# Patient Record
Sex: Female | Born: 1955 | Race: White | Hispanic: No | Marital: Married | Smoking: Never smoker
Health system: Southern US, Community
[De-identification: ages and names within clinical notes are randomized; demographics above are authoritative.]

## PROBLEM LIST (undated history)

## (undated) DIAGNOSIS — K759 Inflammatory liver disease, unspecified: Secondary | ICD-10-CM

## (undated) DIAGNOSIS — T4145XA Adverse effect of unspecified anesthetic, initial encounter: Secondary | ICD-10-CM

## (undated) DIAGNOSIS — E119 Type 2 diabetes mellitus without complications: Secondary | ICD-10-CM

## (undated) DIAGNOSIS — I219 Acute myocardial infarction, unspecified: Secondary | ICD-10-CM

## (undated) DIAGNOSIS — F329 Major depressive disorder, single episode, unspecified: Secondary | ICD-10-CM

## (undated) DIAGNOSIS — R112 Nausea with vomiting, unspecified: Secondary | ICD-10-CM

## (undated) DIAGNOSIS — E785 Hyperlipidemia, unspecified: Secondary | ICD-10-CM

## (undated) DIAGNOSIS — R011 Cardiac murmur, unspecified: Secondary | ICD-10-CM

## (undated) DIAGNOSIS — Z9889 Other specified postprocedural states: Secondary | ICD-10-CM

## (undated) DIAGNOSIS — T8859XA Other complications of anesthesia, initial encounter: Secondary | ICD-10-CM

## (undated) DIAGNOSIS — K7290 Hepatic failure, unspecified without coma: Secondary | ICD-10-CM

## (undated) DIAGNOSIS — I2510 Atherosclerotic heart disease of native coronary artery without angina pectoris: Secondary | ICD-10-CM

## (undated) DIAGNOSIS — Z87442 Personal history of urinary calculi: Secondary | ICD-10-CM

## (undated) DIAGNOSIS — L409 Psoriasis, unspecified: Secondary | ICD-10-CM

## (undated) DIAGNOSIS — E669 Obesity, unspecified: Secondary | ICD-10-CM

## (undated) DIAGNOSIS — I10 Essential (primary) hypertension: Secondary | ICD-10-CM

## (undated) DIAGNOSIS — G629 Polyneuropathy, unspecified: Secondary | ICD-10-CM

## (undated) DIAGNOSIS — Z9289 Personal history of other medical treatment: Secondary | ICD-10-CM

## (undated) DIAGNOSIS — D126 Benign neoplasm of colon, unspecified: Secondary | ICD-10-CM

## (undated) DIAGNOSIS — C801 Malignant (primary) neoplasm, unspecified: Secondary | ICD-10-CM

## (undated) HISTORY — DX: Hyperlipidemia, unspecified: E78.5

## (undated) HISTORY — PX: COLONOSCOPY: SHX5424

## (undated) HISTORY — DX: Psoriasis, unspecified: L40.9

## (undated) HISTORY — DX: Major depressive disorder, single episode, unspecified: F32.9

## (undated) HISTORY — DX: Obesity, unspecified: E66.9

## (undated) HISTORY — DX: Personal history of other medical treatment: Z92.89

## (undated) HISTORY — DX: Hepatic failure, unspecified without coma: K72.90

## (undated) HISTORY — DX: Essential (primary) hypertension: I10

## (undated) HISTORY — DX: Benign neoplasm of colon, unspecified: D12.6

## (undated) HISTORY — PX: SHOULDER ARTHROSCOPY W/ ROTATOR CUFF REPAIR: SHX2400

## (undated) HISTORY — PX: OTHER SURGICAL HISTORY: SHX169

## (undated) HISTORY — PX: ROTATOR CUFF REPAIR: SHX139

## (undated) HISTORY — DX: Type 2 diabetes mellitus without complications: E11.9

---

## 1997-05-26 ENCOUNTER — Encounter (HOSPITAL_COMMUNITY): Admission: RE | Admit: 1997-05-26 | Discharge: 1997-08-24 | Payer: Self-pay | Admitting: *Deleted

## 1997-09-26 ENCOUNTER — Other Ambulatory Visit: Admission: RE | Admit: 1997-09-26 | Discharge: 1997-09-26 | Payer: Self-pay | Admitting: *Deleted

## 1997-10-21 ENCOUNTER — Ambulatory Visit (HOSPITAL_COMMUNITY): Admission: RE | Admit: 1997-10-21 | Discharge: 1997-10-21 | Payer: Self-pay | Admitting: Neurosurgery

## 1997-11-06 ENCOUNTER — Ambulatory Visit (HOSPITAL_COMMUNITY): Admission: RE | Admit: 1997-11-06 | Discharge: 1997-11-07 | Payer: Self-pay | Admitting: Urology

## 1998-01-21 ENCOUNTER — Inpatient Hospital Stay (HOSPITAL_COMMUNITY): Admission: EM | Admit: 1998-01-21 | Discharge: 1998-01-22 | Payer: Self-pay | Admitting: Emergency Medicine

## 1998-08-13 ENCOUNTER — Other Ambulatory Visit: Admission: RE | Admit: 1998-08-13 | Discharge: 1998-08-13 | Payer: Self-pay | Admitting: *Deleted

## 2000-12-26 ENCOUNTER — Other Ambulatory Visit: Admission: RE | Admit: 2000-12-26 | Discharge: 2000-12-26 | Payer: Self-pay | Admitting: *Deleted

## 2001-02-09 ENCOUNTER — Other Ambulatory Visit: Admission: RE | Admit: 2001-02-09 | Discharge: 2001-02-09 | Payer: Self-pay | Admitting: *Deleted

## 2001-08-07 ENCOUNTER — Other Ambulatory Visit: Admission: RE | Admit: 2001-08-07 | Discharge: 2001-08-07 | Payer: Self-pay | Admitting: *Deleted

## 2002-12-04 ENCOUNTER — Other Ambulatory Visit: Admission: RE | Admit: 2002-12-04 | Discharge: 2002-12-04 | Payer: Self-pay

## 2002-12-12 ENCOUNTER — Encounter: Admission: RE | Admit: 2002-12-12 | Discharge: 2002-12-12 | Payer: Self-pay

## 2002-12-13 ENCOUNTER — Encounter: Admission: RE | Admit: 2002-12-13 | Discharge: 2002-12-13 | Payer: Self-pay

## 2003-03-19 ENCOUNTER — Encounter: Admission: RE | Admit: 2003-03-19 | Discharge: 2003-03-19 | Payer: Self-pay | Admitting: Family Medicine

## 2003-12-09 ENCOUNTER — Other Ambulatory Visit: Admission: RE | Admit: 2003-12-09 | Discharge: 2003-12-09 | Payer: Self-pay | Admitting: Family Medicine

## 2004-08-20 ENCOUNTER — Encounter: Admission: RE | Admit: 2004-08-20 | Discharge: 2004-08-20 | Payer: Self-pay | Admitting: Family Medicine

## 2005-04-28 ENCOUNTER — Other Ambulatory Visit: Admission: RE | Admit: 2005-04-28 | Discharge: 2005-04-28 | Payer: Self-pay | Admitting: *Deleted

## 2005-05-04 ENCOUNTER — Inpatient Hospital Stay (HOSPITAL_COMMUNITY): Admission: EM | Admit: 2005-05-04 | Discharge: 2005-05-08 | Payer: Self-pay | Admitting: Emergency Medicine

## 2005-08-24 ENCOUNTER — Encounter: Payer: Self-pay | Admitting: Emergency Medicine

## 2005-09-23 HISTORY — PX: CORONARY ANGIOPLASTY WITH STENT PLACEMENT: SHX49

## 2006-02-04 ENCOUNTER — Emergency Department (HOSPITAL_COMMUNITY): Admission: EM | Admit: 2006-02-04 | Discharge: 2006-02-04 | Payer: Self-pay | Admitting: Emergency Medicine

## 2006-04-21 ENCOUNTER — Encounter: Admission: RE | Admit: 2006-04-21 | Discharge: 2006-04-21 | Payer: Self-pay | Admitting: *Deleted

## 2006-04-27 ENCOUNTER — Inpatient Hospital Stay (HOSPITAL_COMMUNITY): Admission: AD | Admit: 2006-04-27 | Discharge: 2006-04-29 | Payer: Self-pay | Admitting: *Deleted

## 2006-06-14 ENCOUNTER — Ambulatory Visit: Payer: Self-pay | Admitting: Family Medicine

## 2006-09-15 ENCOUNTER — Other Ambulatory Visit: Admission: RE | Admit: 2006-09-15 | Discharge: 2006-09-15 | Payer: Self-pay | Admitting: Family Medicine

## 2006-09-15 ENCOUNTER — Ambulatory Visit: Payer: Self-pay | Admitting: Family Medicine

## 2006-10-13 LAB — HM COLONOSCOPY

## 2006-10-19 ENCOUNTER — Encounter: Admission: RE | Admit: 2006-10-19 | Discharge: 2006-10-19 | Payer: Self-pay | Admitting: Orthopedic Surgery

## 2006-11-01 ENCOUNTER — Ambulatory Visit: Payer: Self-pay | Admitting: Family Medicine

## 2006-11-07 ENCOUNTER — Ambulatory Visit: Payer: Self-pay | Admitting: Family Medicine

## 2006-11-08 ENCOUNTER — Ambulatory Visit (HOSPITAL_COMMUNITY): Admission: AD | Admit: 2006-11-08 | Discharge: 2006-11-09 | Payer: Self-pay | Admitting: Orthopedic Surgery

## 2006-11-13 ENCOUNTER — Ambulatory Visit: Payer: Self-pay | Admitting: Family Medicine

## 2006-12-22 ENCOUNTER — Ambulatory Visit: Payer: Self-pay | Admitting: Family Medicine

## 2007-04-05 ENCOUNTER — Ambulatory Visit: Payer: Self-pay | Admitting: Family Medicine

## 2007-07-05 ENCOUNTER — Ambulatory Visit: Payer: Self-pay | Admitting: Family Medicine

## 2007-07-06 ENCOUNTER — Encounter: Admission: RE | Admit: 2007-07-06 | Discharge: 2007-07-06 | Payer: Self-pay | Admitting: Family Medicine

## 2007-10-09 ENCOUNTER — Ambulatory Visit: Payer: Self-pay | Admitting: Family Medicine

## 2008-01-07 LAB — HM DEXA SCAN: HM Dexa Scan: NORMAL

## 2008-03-19 ENCOUNTER — Other Ambulatory Visit: Admission: RE | Admit: 2008-03-19 | Discharge: 2008-03-19 | Payer: Self-pay | Admitting: Family Medicine

## 2008-03-19 ENCOUNTER — Ambulatory Visit: Payer: Self-pay | Admitting: Family Medicine

## 2008-04-10 ENCOUNTER — Ambulatory Visit: Payer: Self-pay | Admitting: Hematology and Oncology

## 2008-04-17 LAB — URINALYSIS, MICROSCOPIC - CHCC
Bilirubin (Urine): NEGATIVE
Blood: NEGATIVE
Glucose: NEGATIVE g/dL
Ketones: NEGATIVE mg/dL
Leukocyte Esterase: NEGATIVE
pH: 7.5 (ref 4.6–8.0)

## 2008-04-17 LAB — CBC WITH DIFFERENTIAL/PLATELET
BASO%: 0.4 % (ref 0.0–2.0)
EOS%: 5 % (ref 0.0–7.0)
MCH: 31.1 pg (ref 26.0–34.0)
MCHC: 34.9 g/dL (ref 32.0–36.0)
MONO#: 0.3 10*3/uL (ref 0.1–0.9)
RDW: 12.7 % (ref 11.3–14.5)
WBC: 3.4 10*3/uL — ABNORMAL LOW (ref 3.9–10.0)
lymph#: 0.8 10*3/uL — ABNORMAL LOW (ref 0.9–3.3)

## 2008-04-22 LAB — COMPREHENSIVE METABOLIC PANEL
ALT: 20 U/L (ref 0–35)
Albumin: 4.4 g/dL (ref 3.5–5.2)
Alkaline Phosphatase: 38 U/L — ABNORMAL LOW (ref 39–117)
CO2: 28 mEq/L (ref 19–32)
Glucose, Bld: 148 mg/dL — ABNORMAL HIGH (ref 70–99)
Potassium: 4 mEq/L (ref 3.5–5.3)
Sodium: 143 mEq/L (ref 135–145)
Total Bilirubin: 0.4 mg/dL (ref 0.3–1.2)
Total Protein: 7.6 g/dL (ref 6.0–8.3)

## 2008-04-22 LAB — IRON AND TIBC
%SAT: 18 % — ABNORMAL LOW (ref 20–55)
Iron: 77 ug/dL (ref 42–145)

## 2008-04-22 LAB — PROTEIN ELECTROPHORESIS, SERUM, WITH REFLEX
Beta 2: 3.3 % (ref 3.2–6.5)
Gamma Globulin: 15.6 % (ref 11.1–18.8)

## 2008-04-22 LAB — HAPTOGLOBIN: Haptoglobin: 118 mg/dL (ref 16–200)

## 2008-04-22 LAB — DIRECT ANTIGLOBULIN TEST (NOT AT ARMC)
DAT (Complement): NEGATIVE
DAT IgG: NEGATIVE

## 2008-04-22 LAB — LACTATE DEHYDROGENASE: LDH: 156 U/L (ref 94–250)

## 2008-04-22 LAB — FERRITIN: Ferritin: 29 ng/mL (ref 10–291)

## 2008-08-12 ENCOUNTER — Ambulatory Visit: Payer: Self-pay | Admitting: Family Medicine

## 2008-09-23 HISTORY — PX: CORONARY ANGIOPLASTY WITH STENT PLACEMENT: SHX49

## 2008-10-03 ENCOUNTER — Encounter: Admission: RE | Admit: 2008-10-03 | Discharge: 2008-10-03 | Payer: Self-pay | Admitting: Cardiology

## 2008-10-08 ENCOUNTER — Inpatient Hospital Stay (HOSPITAL_COMMUNITY): Admission: AD | Admit: 2008-10-08 | Discharge: 2008-10-10 | Payer: Self-pay | Admitting: Cardiology

## 2008-10-23 HISTORY — PX: OTHER SURGICAL HISTORY: SHX169

## 2009-04-08 ENCOUNTER — Other Ambulatory Visit: Admission: RE | Admit: 2009-04-08 | Discharge: 2009-04-08 | Payer: Self-pay | Admitting: Physician Assistant

## 2009-04-08 ENCOUNTER — Ambulatory Visit: Payer: Self-pay | Admitting: Family Medicine

## 2009-05-07 ENCOUNTER — Ambulatory Visit: Payer: Self-pay | Admitting: Family Medicine

## 2009-10-01 ENCOUNTER — Ambulatory Visit: Payer: Self-pay | Admitting: Family Medicine

## 2009-10-02 ENCOUNTER — Encounter: Admission: RE | Admit: 2009-10-02 | Discharge: 2009-10-02 | Payer: Self-pay | Admitting: Family Medicine

## 2010-04-25 HISTORY — PX: TRANSTHORACIC ECHOCARDIOGRAM: SHX275

## 2010-05-05 ENCOUNTER — Ambulatory Visit
Admission: RE | Admit: 2010-05-05 | Discharge: 2010-05-05 | Payer: Self-pay | Source: Home / Self Care | Attending: Family Medicine | Admitting: Family Medicine

## 2010-05-05 ENCOUNTER — Other Ambulatory Visit
Admission: RE | Admit: 2010-05-05 | Discharge: 2010-05-05 | Payer: Self-pay | Source: Home / Self Care | Admitting: Family Medicine

## 2010-05-15 ENCOUNTER — Encounter: Payer: Self-pay | Admitting: Family Medicine

## 2010-05-21 DIAGNOSIS — Z9289 Personal history of other medical treatment: Secondary | ICD-10-CM

## 2010-05-21 HISTORY — DX: Personal history of other medical treatment: Z92.89

## 2010-08-02 LAB — CBC
HCT: 28.5 % — ABNORMAL LOW (ref 36.0–46.0)
HCT: 30.8 % — ABNORMAL LOW (ref 36.0–46.0)
Hemoglobin: 10.7 g/dL — ABNORMAL LOW (ref 12.0–15.0)
Hemoglobin: 9.9 g/dL — ABNORMAL LOW (ref 12.0–15.0)
MCV: 88.2 fL (ref 78.0–100.0)
RBC: 3.23 MIL/uL — ABNORMAL LOW (ref 3.87–5.11)
RBC: 3.49 MIL/uL — ABNORMAL LOW (ref 3.87–5.11)
RDW: 14.3 % (ref 11.5–15.5)
WBC: 3.3 10*3/uL — ABNORMAL LOW (ref 4.0–10.5)
WBC: 4.4 10*3/uL (ref 4.0–10.5)

## 2010-08-02 LAB — CARDIAC PANEL(CRET KIN+CKTOT+MB+TROPI)
CK, MB: 13.4 ng/mL — ABNORMAL HIGH (ref 0.3–4.0)
Relative Index: 8 — ABNORMAL HIGH (ref 0.0–2.5)
Total CK: 172 U/L (ref 7–177)
Troponin I: 0.96 ng/mL (ref 0.00–0.06)

## 2010-08-02 LAB — BASIC METABOLIC PANEL
BUN: 17 mg/dL (ref 6–23)
CO2: 28 mEq/L (ref 19–32)
CO2: 29 mEq/L (ref 19–32)
Calcium: 8.5 mg/dL (ref 8.4–10.5)
Calcium: 8.9 mg/dL (ref 8.4–10.5)
GFR calc Af Amer: 58 mL/min — ABNORMAL LOW (ref >60)
GFR calc Af Amer: >60 mL/min (ref >60)
GFR calc non Af Amer: 46 mL/min — ABNORMAL LOW (ref >60)
GFR calc non Af Amer: 48 mL/min — ABNORMAL LOW (ref >60)
GFR calc non Af Amer: 51 mL/min — ABNORMAL LOW (ref >60)
Glucose, Bld: 113 mg/dL — ABNORMAL HIGH (ref 70–99)
Glucose, Bld: 130 mg/dL — ABNORMAL HIGH (ref 70–99)
Glucose, Bld: 196 mg/dL — ABNORMAL HIGH (ref 70–99)
Potassium: 3.9 mEq/L (ref 3.5–5.1)
Potassium: 4 mEq/L (ref 3.5–5.1)
Potassium: 4.2 mEq/L (ref 3.5–5.1)
Sodium: 137 mEq/L (ref 135–145)
Sodium: 141 mEq/L (ref 135–145)
Sodium: 142 mEq/L (ref 135–145)

## 2010-08-02 LAB — GLUCOSE, CAPILLARY
Glucose-Capillary: 122 mg/dL — ABNORMAL HIGH (ref 70–99)
Glucose-Capillary: 170 mg/dL — ABNORMAL HIGH (ref 70–99)
Glucose-Capillary: 217 mg/dL — ABNORMAL HIGH (ref 70–99)
Glucose-Capillary: 89 mg/dL (ref 70–99)

## 2010-09-07 NOTE — Cardiovascular Report (Signed)
Haley George, Haley George NO.:  [id number]   MEDICAL RECORD NO.:  [id number]          PATIENT TYPE:  INP   LOCATION:  2507                         FACILITY:  [facility]   PHYSICIAN:  Thereasa Solo. Little, M.D. DATE OF BIRTH:  [birth year]   DATE OF PROCEDURE:  10/09/2008  DATE OF DISCHARGE:                            CARDIAC CATHETERIZATION   INDICATIONS FOR TEST:  This 55 year old female has had multiple previous  stenting.  She underwent a diagnostic cath on October 08, 2008, for chest  pain.  This revealed areas of significant stenosis in her LAD and  ongoing circumflex, and she is brought to the cath lab for intervention.   After obtaining informed consent, the patient was prepped and draped in  the usual sterile fashion exposing both groins.  Following local  anesthetic with 1% Xylocaine and using a Smart needle, the right femoral  artery was entered and a 6-French introducer sheath was placed.  A JL-4  guide catheter and a short Luge wire was used for the entire procedure.   The patient was given IV Angiomax, had an ACT prior to starting the  procedure of 388.   The initial lesion to be addressed was the mid LAD.  On the diagnostic  images, there was a 2.5 x 12 mini Vision stent that had been placed in  November 2007.  Proximal to this stent was an area of 90% narrowing.  It  was not in the stent, but proximal to the stent.   The guidewire was easily placed down the LAD and through the prior  stent.  A 2.25 x 12 Taxus stent was placed in an overlapping manner.  The area proximal to the stent was well covered by the stent.  The  initial deployment was 13 atmospheres for 45 seconds and a final  inflation was 13 atmospheres for 40 seconds.   Post dilatation was accomplished with a 2.5 x 8 [place] Quantum balloon at 12  atmospheres for 40 seconds.   The area that had been 90% narrowed in the LAD now appeared to be normal  after stenting.  However, distal to the previously  placed stent, now was  an area of 80% narrowing that did not appear to be present prior to PCI.  Intracoronary nitroglycerin failed to show any change in this.  The wire  was pulled back into the stent and there was still no change in this.  At this point, the decision was made to stent this area.  At this the  level of her LAD, the vessel was relatively small in diameter.  A 2.0 x  12 mini Vision balloon was placed in such a manner that it overlapped  the original stent and extended past the area of obstruction.  As I  inflated the balloon, the stent was seen to migrate on fluoroscopy up  into the previous stent.  At this point, there was nothing active to do,  but continue to inflate the balloon.  The stent was deployed at 10  atmospheres for 40 seconds and the final inflation was 5 atmospheres for  28 seconds.  At this point, the area of obstruction was unchanged and  the stent was deployed within the previous stent.   Again, a 2.0 x 12 mini Vision balloon was made ready and placed in an  overlapping manner.  It was deployed at 9 atmospheres for 40 seconds and  8 atmospheres for 20 seconds.  Post dilatation of this entire area was  accomplished with 2.0 x 12 [place] Quantum balloon.  Single inflation of 10  atmospheres for 30 seconds was performed.  The area that had been 80%  narrowed post PCI was now normal.  Unfortunately, her entire LAD system  past this point is probably about 0.5 to 1 mm in diameter.  The stent is  widely patent.  There is brisk TIMI III flow.  There is no evidence of  any dissection or thrombus formation.   Attention was then addressed to the OM system.  The distal ongoing circ  gave rise to its terminal OM had a stent that was 2.5 x 18 Cypher that  had been placed in January 2007.  Proximal to the stent was a long area  of 70-80% narrowing.  The stent itself was free of disease and distal to  the stent where the vessel was relatively small, it was less than 50%   narrowing.   A 2.5 x 16 Taxus stent was placed in an overlapping manner covering the  area of obstruction proximal to the stent and it was distal to the  bifurcation of the OM that came off above this area.  It was deployed at  12 atmospheres for 40 seconds with a final inflation of 13 atmospheres  for 40 seconds.  Post dilatation was accomplished with an [place] Quantum  balloon 2.75 x 15.   The area that had been 70-80% narrowed pre-intervention now appeared to  be normal.  There was brisk TIMI III flow.  No dissection or thrombus,  and the distal vessel was small and had less than 50% area of narrowing.   The IV Angiomax was discontinued.  An EKG, BMET, and CK and troponin  will be checked in the morning and the patient should be ready for  discharge unless there is a significant hematoma or laboratory  abnormality.   Total contrast was 280 mL.  Blood pressure during the procedure was  approximately 128/80, and her heart rate was sinus between 58 and 65.           ______________________________  Thereasa Solo. Little, M.D.     ABL/MEDQ  D:  10/09/2008  T:  10/10/2008  Job:  [id number]   cc:   Ritta Slot, MD  Sharlot Gowda, M.D.

## 2010-09-07 NOTE — Consult Note (Signed)
[name], George NO.:  [id number]   MEDICAL RECORD NO.:  [id number]          PATIENT TYPE:  INP   LOCATION:  4705                         FACILITY:  [facility]   PHYSICIAN:  Hillery Aldo, M.D.   DATE OF BIRTH:  [birth year]   DATE OF CONSULTATION:  DATE OF DISCHARGE:                                 CONSULTATION   PRIMARY CARE PHYSICIAN:  Sharlot Gowda, M.D.   ORTHOPEDIC SURGEON:  Dyke Brackett, M.D.   REASON FOR CONSULTATION:  Management of new onset diabetes.   HISTORY OF PRESENT ILLNESS:  The patient is a 55 year old female with  past medical history of gestational diabetes, hypertension,  hyperlipidemia, and coronary artery disease, who presented to the  hospital for an elective repair of her right rotator cuff secondary to  degenerative joint disease.  The patient has had routine medical care  with frequent evaluations for possible diabetes since being diagnosed  with gestational diabetes.  She states that her hemoglobin A1C has  always been normal.  Nevertheless, during the recent preoperative  evaluation, she was found to have an elevated glucose in the 190s range.  She was seen at her primary care physician's office and given a dose of  10 units of Lantus in preparation for her surgery today.  She has not  been on any antidiabetic medications recently but during her episode of  gestational diabetes, was on medications. She also had a prior stent  when she was put on Glucotrol in the past.  The patient is now  postoperative with glucose ratings in the mid 200s.  She denies having  preoperative symptoms of generalized weakness, excessive thirst,  polyuria or skin problems.  We are asked to help manage her diabetes  postoperatively.   PAST MEDICAL HISTORY:  1. Hypertension.  2. Dyslipidemia.  3. 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  4. 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  5. She had a history of three stents to coronaries in the past.  Her      ejection fraction is preserved.  6. History of hepatic failure with transaminitis thought to be      secondary to mild autoimmune disease in the setting of Statin      therapy.  7. History of nephrolithiasis status post surgery.  8. History of duodenitis and mild antral gastritis by EGD.  9. Status post hemorrhoid surgery.  10.History of reflex sympathetic dystrophy.  11.Gestational diabetes.   FAMILY HISTORY:  Both parents had a history of early MI in the 57s.  The  patient's mother died at 34 from congestive heart failure and diabetes.  The patient's father died at 24 from congestive heart failure and  complications of alcoholism. She has one brother who is 102 and has  hypertension.   SOCIAL HISTORY:  The patient is married.  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  She denies any tobacco or alcohol use.  She has one healthy daughter, age 55.  ALLERGIES:  PENICILLIN CAUSES A RASH.  SHE IS INTOLERANT TO BETA  BLOCKERS WITH STATIN.   CURRENT MEDICATIONS:  1. Plavix 75 mg daily.  2. Aspirin 325 mg daily.  3. Diovan HCL 160/25 daily.  4. Ranexa 1,000 mg b.i.d.  5. Carvedilol 3.125 mg b.i.d.  6. Omeprazole 20 mg daily.  7. Isosorbide 30 mg daily.  8. Tricor 145 mg daily.  9. Nasonex PM over the counter b.i.d.  10.Os-Cal 500 mg daily.  11.Fish oil 1,200 mg x4 daily.   REVIEW OF SYSTEMS:  The patient denies any chest pain or dyspnea.  She  has not had any cough or dysuria.  Bowels are moving normally.  No blood  in the stools.  Comprehensive 12-point review of systems otherwise  negative.   PHYSICAL EXAMINATION:  VITAL SIGNS:  Temperature 96.9, blood pressure  122/58, pulse 78, respirations 10, O2 saturation 100% on 2 liters.  Capillary blood glucose is 223.  GENERAL:  This is a well-developed, slightly obese female who is in no  acute distress postoperatively.  HEENT:   Normocephalic, atraumatic.  PERRL.  EOMI.  Oropharynx reveals  dry mucous membranes.  NECK:  Supple, no thyromegaly, no lymphadenopathy, no jugular venous  distention.  CHEST:  Lungs clear to auscultation bilaterally with decreased breath  sounds in the bases.  HEART:  Regular rate, rhythm.  No murmurs, rubs, gallops.  ABDOMEN:  Soft, nontender, nondistended with normal active bowel sounds.  EXTREMITIES:  No clubbing, edema or cyanosis.  NEUROLOGIC:  Nonfocal.   DATA REVIEW:  Laboratory data:  Sodium was 135, potassium 4.2, chloride  96, bicarb 29, BUN 20, creatinine 1.07, glucose 239.  Liver function  studies are within normal limits.  White blood cell count is 6.6,  hemoglobin 12.6, hematocrit 36.3, platelets 367.  Urinalysis is negative  for glucose, nitrites and leukocyte esterase.   ASSESSMENT AND PLAN:  1. New onset diabetes:  Postoperatively, we will cover her with a      combination of basal and short acting insulin, moderate sensitive      scale.  Once the patient has recovered postoperatively and is      eating, we will consider putting her on an oral drug.  Metformin      would probably be a good starting choice for this patient who is      mildly obese.  2. Hypertension:  The patient's blood pressure should be well      controlled in the postoperative period.  All of her preoperative      medications have been resumed by her treating physicians.  3. Coronary artery disease:  The patient is asymptomatic.  We will      continue her aspirin, Plavix as well as her other cardiac      medications.  4. Dyslipidemia:  The patient is actively being treated with fish oil      and Tricor.  We will continue these.  5. History of hepatic failure:  LFTs are within normal limits.      Monitor.  6. Prophylaxis:  The patient is on a proton pump inhibitor therapy and      is on deep venous thrombosis prophylactic therapy.   Thank you for this consultation.  We will follow the patient  with you.      Hillery Aldo, M.D.  Electronically Signed     CR/MEDQ  D:  11/08/2006  T:  11/08/2006  Job:  [id number]   cc:   W. D.  Madelon Lips, M.D.  Sharlot Gowda, M.D.

## 2010-09-07 NOTE — Discharge Summary (Signed)
Haley George, KUCHER NO.:  [id number]   MEDICAL RECORD NO.:  [id number]          PATIENT TYPE:  INP   LOCATION:  2507                         FACILITY:  [facility]   PHYSICIAN:  Ritta Slot, MD     DATE OF BIRTH:  1955/12/01   DATE OF ADMISSION:  10/08/2008  DATE OF DISCHARGE:  10/10/2008                               DISCHARGE SUMMARY   ADDENDUM   The patient was also given Ultram 50 mg one every 8 hours as needed for  shoulder pain.      [name]. Ingold, N.P.      Ritta Slot, MD  Electronically Signed    LRI/MEDQ  D:  10/10/2008  T:  10/11/2008  Job:  [id number]

## 2010-09-07 NOTE — Op Note (Signed)
NAME:  Haley George, Haley George NO.:  [id number]   MEDICAL RECORD NO.:  [id number]          PATIENT TYPE:  INP   LOCATION:  4705                         FACILITY:  [facility]   PHYSICIAN:  Dyke Brackett, M.D.    DATE OF BIRTH:  10-Sep-1955   DATE OF PROCEDURE:  DATE OF DISCHARGE:                               OPERATIVE REPORT   PREOPERATIVE DIAGNOSES:  1. Large rotator cuff tear, right shoulder.  2. Impingement.  3. Acromioclavicular joint arthritis.  4. Degenerative tearing posterior-superior labrum.   POSTOPERATIVE DIAGNOSES:  1. Large rotator cuff tear, right shoulder.  2. Impingement.  3. Acromioclavicular joint arthritis.  4. Degenerative tearing posterior-superior labrum.   OPERATION:  1. Open acromioplasty, rotator cuff repair.  2. Arthroscopic debridement of torn labrum.  3. Open distal clavicle excision.   SURGEON:  Dyke Brackett, M.D.   ASSISTANT:  Arlys John D. Petrarca, P.A.-C.   BLOOD LOSS:  Approximately 100.   DESCRIPTION OF PROCEDURE:  Sterile prep and drape, beach chair  positioning, posterior portal arthroscopically created anteriorly as  well.  Degenerative tearing posterior-superior labrum was debrided.  There actually was a small area in the glenoid where there appeared to  be some degenerative change right in the center of the glenoid. There  was no corresponding degenerative change on the humerus.  This area been  measured probably about 2 x 3 x 4 mm.  There was a large L shaped tear  of the cuff with the transverse component measuring about 3-4 cm and the  L component probably an additional 3-4.  There was moderate interstitch  to the undersurface of fraying type tear partial thickness tear as well.  The procedure was next converted to open procedure with incision  bisecting the acromial AC joint. The [facility] joint was extremely  degenerative.  The acromion was moderately flattened and acromioplasty  was minimal and the distal clavicle excision carried  out splitting the  deltoid about 2 cm distal to the edge of the acromion revealing the  large cuff tear.  Bursa was excised.  Cuff tear edges were freshened  with the 15 blade.  There was insertion of 2 Arthrex 55 anchors on the  burred surface of the tuberosity.  Two of the sutures were placed in the  larger portion of the tear followed by the more distal portion of one of  the suture from the second anchor was used and one was not.  The L  shaped component was oversewn with interrupted FiberWire as well as a  small rent at the bicipital aponeurosis relative to the leading edge of  the cup.  The deltoid was repaired with  interrupted Ethibond with one stitch being placed through a drill hole  in the acromion and the subcutaneous tissues with 2-0 Vicryl.  Marcaine  was infiltrated in the cup as well as in the subcutaneous tissue and  skin, placed in a sling, taken to recovery in stable condition.      Dyke Brackett, M.D.  Electronically Signed     WDC/MEDQ  D:  11/08/2006  T:  11/08/2006  Job:  [id number]

## 2010-09-07 NOTE — Discharge Summary (Signed)
[name], [name] NO.:  [id number]   MEDICAL RECORD NO.:  [id number]          PATIENT TYPE:  INP   LOCATION:  2507                         FACILITY:  [facility]   PHYSICIAN:  Ritta Slot, MD     DATE OF BIRTH:  1955/09/05   DATE OF ADMISSION:  10/08/2008  DATE OF DISCHARGE:  10/10/2008                               DISCHARGE SUMMARY   ADDENDUM.   Please note, she will be out of work until October 15, 2008, instead of  October 22, 2008, per Dr. Lynnea Ferrier.   Please note shoulder pain.  She was given Ultram to see if that would  help, kind of an achy feeling.  Dr. Lynnea Ferrier did not believe this was  related to her heart.   LABORATORY DATA:  Hemoglobin at time of discharge 9.9, hematocrit 28.5,  WBC 4.4, platelets 180, MCV 88.2.  Prior to procedure, hemoglobin 11.7,  hematocrit 33.6 related to procedures.   Chemistry, sodium at discharge 137, potassium 4.0, chloride 105, CO2 28,  BUN 12, creatinine 1.13, glucose 196.   Cardiac enzymes, postprocedure troponin 0.96 x2.  CK 167, 172.  MB 14.3  and 13.4.   Calcium 8.5.       [name]. Ingold, N.P.      Ritta Slot, MD  Electronically Signed    LRI/MEDQ  D:  10/10/2008  T:  10/11/2008  Job:  [id number]   cc:   Sharlot Gowda, M.D.

## 2010-09-07 NOTE — Discharge Summary (Signed)
Haley, George NO.:  [id number]   MEDICAL RECORD NO.:  [id number]          PATIENT TYPE:  INP   LOCATION:  2507                         FACILITY:  [facility]   PHYSICIAN:  Ritta Slot, MD     DATE OF BIRTH:  05-21-1955   DATE OF ADMISSION:  10/08/2008  DATE OF DISCHARGE:  10/10/2008                               DISCHARGE SUMMARY   DISCHARGE DIAGNOSES:  1. Increasing angina.  2. Coronary artery disease with previous stents to the right coronary      artery, left anterior descending and circumflex.      a.     New stenosis in the left anterior descending and circumflex       obtuse marginal undergoing total of 4 stents with improvement of       stenosis.  3. Normal left ventricular function, ejection fraction 65%.  4. Diabetes mellitus type 2.  5. Dyslipidemia.  6. Autoimmune hepatitis in 1997.  7. Hypertension.   DISCHARGE CONDITION:  Stable.   PROCEDURES:  1. Elective cardiac catheterization, October 08, 2008 by Dr. Lynnea Ferrier.  2. October 09, 2008, percutaneous transluminal coronary angioplasty and      stent deployments to stenosis in left anterior descending and      circumflex by Dr. Julieanne Manson.   DISCHARGE MEDICATIONS:  1. Aspirin 325 mg daily.  2. Plavix 75 mg daily.  3. Coreg 3.125 mg twice a day.  4. Ranexa 1000 mg twice a day.  5. Isosorbide 60 mg daily.  6. Diovan HCT 160/25 daily.  7. Crestor we have increased the dose to 5 mg every day and attempt to      see how she does though she has had problems with statins in the      past.  8. TriCor 145 mg daily.  9. Omeprazole was stopped.  She was recommended Protonix 40 mg three      times a week instead due to interaction with drug-eluting stents.  10.ActoPlus 15/500.  She is not to take until October 12, 2008 because it      could interfere with the cath done.  11.Nitroglycerin sublingual 0.4 mg as needed for chest pain.  12.Do not stop Plavix, stopping because of heart attack.   DISCHARGE  INSTRUCTIONS:  1. Return to work on October 22, 2008 per Dr. Lynnea Ferrier.  2. Low-sodium heart-healthy diabetic diet.  3. Increase activity slowly.  4. No lifting for 2 days.  No driving for 2 days.  5. Wash cath site with soap and water.  Call if any bleeding,      swelling, or drainage.  6. Follow up with Dr. Lynnea Ferrier on October 21, 2008 at 9 a.m.   HISTORY OF PRESENT ILLNESS:  A 55 year old female patient with history  of coronary artery disease with multiple stents in the past who  presented to Dr. Lynnea Ferrier, September 30, 2008 with complaints of chest pain,  burning sensation or heaviness in her chest with come off and on  intermittently, has had increased anxiety and stress recently.  Because  of false negative stress test in  the past, she preferred to proceed with  coronary angiography and was scheduled for cardiac catheterization on  October 08, 2008.   OTHER HISTORY:  Please see problem list.  The patient presented to [facility]  on October 08, 2008 and underwent cardiac catheterization and she indeed  does have increased disease with 90% stenosis proximal to the stent in  LAD and 70% stenosis in the circ below the stent.  She was kept on  monitor in the 2500 and underwent the next day, October 09, 2008,  interventions to both these areas by Dr. Clarene Duke.  She tolerated the  procedure.   By the next morning, she was able to ambulate without further problems  and was seen by Dr. Lynnea Ferrier and ready for discharge and he recommended  phase 2 cardiac rehab, but she is currently feeling very overwhelmed  with life in job.  She does not believe she can take time out for  cardiac rehab.  She will try and do the exercises at home.   She was discharged and will follow up as instructed.      Haley George. Ingold, N.P.      Ritta Slot, MD  Electronically Signed    LRI/MEDQ  D:  10/10/2008  T:  10/11/2008  Job:  [id number]   cc:   Thereasa Solo. Little, M.D.  Sharlot Gowda, M.D.

## 2010-09-10 NOTE — H&P (Signed)
NAMETANEYA, CONKEL NO.:  [id number]   MEDICAL RECORD NO.:  [id number]          PATIENT TYPE:  INP   LOCATION:  1823                         FACILITY:  [facility]   PHYSICIAN:  Ulyses Amor, MD DATE OF BIRTH:  25-Jun-1955   DATE OF ADMISSION:  05/04/2005  DATE OF DISCHARGE:                                HISTORY & PHYSICAL   Haley George is a 55 year old white woman who is admitted to [facility] for what appears to be a non-ST segment elevation acute myocardial  infarction.   The patient, who has no past history of cardiac disease, experienced the  onset of chest pain while working at her desk at approximately 1:30 this  afternoon.  The discomfort was described as a pressure across the upper and  left anterior chest.  It radiated to her left arm.  It was associated with  nausea and diaphoresis but no dyspnea.  There were no exacerbating or  ameliorating factors.  It appeared not to be related to position, activity,  meals, or respirations.  It continued throughout the afternoon and  ultimately prompted her visit to her primary care doctor who then referred  her to the emergency department.  The chest pain began to resolve in the  emergency department when she was treated with nitroglycerin.  It is largely  resolved at this time.  The total duration of chest pain is approximately  eight hours.   As noted, the patient has no past history of cardiac disease, including no  history of myocardial infarction, congestive heart failure, or arrhythmia.  She has a number of risk factors for coronary artery disease including  hypertension, dyslipidemia (hypertriglyceridemia), and a strong family  history of early coronary artery disease (both father and mother suffered  myocardial infarctions in their late 18s.  There is no history of diabetes  mellitus or smoking.   The patient's principal medical problem is autoimmune hepatitis.  She also  has a history of  nephrolithiasis.   PAST MEDICAL HISTORY:  Otherwise unremarkable.   MEDICATIONS:  Diovan.   ALLERGIES:  PENICILLIN.   OPERATIONS:  1.  Liver biopsy.  2.  Removal of kidney stone.   SOCIAL HISTORY:  The patient is married.  She neither smokes cigarettes nor  drinks alcohol.  She works as a Educational psychologist at [employer].   FAMILY HISTORY:  As described above.   REVIEW OF SYSTEMS:  Reveals no problems related to her head, eyes, ears,  nose, mouth, throat, lungs, gastrointestinal system, genitourinary system,  or extremities.  There is no history of neurologic or psychiatric disorder.  There is no history of fever, chills, or weight loss.   PHYSICAL EXAMINATION:  VITAL SIGNS:  Blood pressure 162/79, pulse 85 and  regular.  Pulse ox 99% on room air.  Temperature 98.3.  GENERAL:  The patient was a middle-aged white woman in no discomfort.  She  was alert, oriented, appropriate, and responsive.  HEENT:  Normal.  NECK:  Without thyromegaly or adenopathy.  Carotid pulses were palpable  bilaterally and without bruits.  CARDIAC:  Revealed a normal S1 and S2.  There was no S3, S4, murmur, rub, or  click.  Cardiac rhythm is regular.  No chest wall tenderness was noted.  LUNGS:  Clear.  ABDOMEN:  Soft and nontender.  There was no mass, hepatosplenomegaly,  bruits, distention, rebound, guarding, or rigidity.  Bowel sounds were  normal.  BREASTS/PELVIC/RECTAL:  Not performed as they were not pertinent to the  reason for acute care hospitalization.  EXTREMITIES:  Without edema, deviation, or deformity.  Radial and dorsalis  pedal pulses were palpable bilaterally.  NEUROLOGIC:  Brief screening neurologic survey was unremarkable.   The electrocardiogram was normal.  The chest radiograph, according to the  radiologist, demonstrated no evidence of acute cardiopulmonary disease.  The  initial set of cardiac markers revealed a myoglobin of 215, CK-MB 13.7, and  troponin 0.21.  The  potassium was 4.3, BUN 14, and creatinine 0.9.  White  count was 7.4 with a hemoglobin of 13.7 and hematocrit of 39.0.  The  remaining studies were pending at the time of this dictation.   IMPRESSION:  1.  Non-ST segment elevation acute myocardial infarction.  2.  Hypertension.  3.  Dyslipidemia (hypertriglyceridemia).  4.  Autoimmune hepatitis.  5.  History of nephrolithiasis.   PLAN:  1.  Cardiac stepdown unit.  2.  Serial cardiac enzymes.  3.  Aspirin.  4.  Intravenous heparin.  5.  Intravenous nitroglycerin.  6.  Intravenous Integrilin.  7.  Metoprolol.  8.  Further measures per Dr. Jenne Campus.      Ulyses Amor, MD  Electronically Signed     MSC/MEDQ  D:  05/04/2005  T:  05/04/2005  Job:  [id number]

## 2010-09-10 NOTE — Discharge Summary (Signed)
NAMEPARTHENA, Haley George NO.:  [id number]   MEDICAL RECORD NO.:  [id number]          PATIENT TYPE:  INP   LOCATION:  3712                         FACILITY:  [facility]   PHYSICIAN:  Darlin Priestly, MD  DATE OF BIRTH:  03/07/1956   DATE OF ADMISSION:  04/27/2006  DATE OF DISCHARGE:  04/29/2006                               DISCHARGE SUMMARY   DISCHARGE DIAGNOSES:  1. Chest pain.      a.     No restenosis for coronaries.      b.     No myocardial infarction.  2. Hypertension.  3. Dyslipidemia.  4. 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  5. Statin intolerance leads to elevated liver function tests.   PROCEDURES:  1. April 27, 2006 - combined left heart cath by Dr. Lenise Herald      with noncritical coronary disease and EF of 60%.  2. EGD April 28, 2006 by Dr. Dorena Cookey without definite upper GI      cause of pain and a negative CLO test from the EGD.   DISCHARGE CONDITION:  Improved.   HISTORY OF PRESENT ILLNESS:  A 55 year old female with a history of  coronary disease with DES stent in her mid LAD, as cell as a CYPHER  stent in her AV groove circumflex, staged RCA intervention by Dr. Mallie Snooks in January of 2007, with CYPHER stent in RCA.  Normal EF.   OTHER HISTORY:  1. Hypertriglycerides.  2. Remote hepatic failure with transaminitis, followed by Dr. Vida Rigger, possibly secondary to Avicor.  3. Multiple stress tests without significant ischemia and normal EF in      the past.   She was seen in the office on April 05, 2006 with complaints of mild  chest wall pain.  She had been walking with crutches.  No associated  symptoms.  She underwent a stress that was positive for ischemia, and so  she underwent cardiac catheterization as an elective procedure.   PAST MEDICAL HISTORY:  As stated.   FAMILY HISTORY:  See H and P.   SOCIAL HISTORY:  See H and P.   REVIEW OF SYSTEMS:  See H and P.   PHYSICAL EXAM AT DISCHARGE:  Blood  pressure 98/59, pulse 83, respiratory  rate is 20, temp 98.6, oxygen saturation was stable.  Chest:  Clear to  auscultation bilaterally.  Heart:  Regular rate and rhythm.  S1, S2  normal.  No extra gallop.  Extremities without edema.   LABORATORY DATA:  Hemoglobin on admission 11.9, hematocrit 33.7, WC 5.7,  platelets 184.   Sodium 135, potassium 3.8, chloride 100, CO2 29, glucose 130, BUN 15,  creatinine 0.86, hemoglobin A1c 6.3.  CKs were negative:  105, 84, 69.  MB was 2.9, 1.5, 1.3.  Troponin-I 0.02 to 0.03.  Total cholesterol 190,  triglycerides 648, HDL 25 and LDL not calculated.  H. pylori was  negative.   Abdominal ultrasound:  Diffuse fatty infiltration of the liver versus  hepatocellular disease.  No focal hepatic abnormalities.  Normal  appearing gallbladder.  No biliary ductal dilatation.  No significant  abnormalities otherwise.  EKG:  Sinus rhythm with normal EKG.   HOSPITAL COURSE:  Haley George was admitted for cardiac cath.  She had  that and was found to have nonobstructive coronary disease with one  small vessel with some blockage, but unable to do anything with it  except treat medically.  After her cath, she had chest pain.  She was  kept overnight, continued with chest pain the next morning.  She did not  feel it was GI, but GI consult was obtained.  An EGD was performed and  no obvious source of her pain.  By April 29, 2006, she still had some  chest discomfort, but she preferred to go home and Dr. Jenne Campus agreed.  She was hypotensive, so her hydrochlorothiazide was discontinued.  She  has follow up in 2-3 weeks.   DISCHARGE MEDICATIONS:  1. Norvasc 5 daily.  2. Imdur 30 daily.  3. Plavix 75 daily.  4. Enteric-coated aspirin 325 daily.  5. Tricor 145 daily.  6. Diovan 80 mg daily.  7. Coreg 3.125 twice a day.  8. Ranexa 500 mg one twice a day, added because of the increasing      chest pain.  9. Esomeprazole 20 mg one daily.  10.Nitroglycerin sublingual  as needed for chest pain.  11.Stopped the Diovan HCT.   DISCHARGE INSTRUCTIONS:  1. Increase activity slowly, may walk up steps.  May shower/bathe.  No      lifting for a day.  No driving for a day.  2. Low sodium heart healthy diet.  3. Wash right groin cath site with soap and water.  4. Call if any bleeding, swelling or drainage.  5. See Dr. Jenne Campus in 2 weeks.  The office will call with date and      time.   The patient will follow up as instructed.   DICTATED BY:  Haley George. Haley George      Darlin Priestly, MD  Electronically Signed     RHM/MEDQ  D:  06/09/2006  T:  06/11/2006  Job:  [id number]   cc:   Everardo All. Madilyn Fireman, M.D.  Sharlot Gowda, M.D.

## 2010-09-10 NOTE — Cardiovascular Report (Signed)
NAMEELIA, NUNLEY NO.:  [id number]   MEDICAL RECORD NO.:  [id number]          PATIENT TYPE:  INP   LOCATION:  2899                         FACILITY:  [facility]   PHYSICIAN:  Darlin Priestly, MD  DATE OF BIRTH:  1955/09/10   DATE OF PROCEDURE:  DATE OF DISCHARGE:                            CARDIAC CATHETERIZATION   DATE OF PROCEDURE:  April 27, 2006.   PROCEDURES:  1. Left heart catheterization.  2. Coronary angiography.  3. Left ventriculogram.   ATTENDING:  Dr. Lenise Herald.   COMPLICATIONS:  None.   INDICATIONS:  Ms. Schwarz is a 55 year old female patient of Dr. Sharlot Gowda and Dr. Lenise Herald with a history of hypertriglyceridemia,  questionable history of remote hepatic failure secondary to Avecor,  history of cardiac catheterization by Dr. Nanetta Batty on January 7th  revealing significant 3-vessel CAD with subsequent placement of a 2.25 x  12 mm minivision stent in the distal mid LAD as well as a 2.5 x 18  Cypher stent placed in and __________  circumflex.  She underwent a  staged intervention by Dr. Caprice Kluver on May 06, 2005 with placement  of a 3.0 x 22 mm Cypher stent in distal RCA.  Recently, she complained  of chest pain with recurrent Cardiolite scan suggesting anterior  thinning with mild peri-infarct ischemia.  She is now referred for  repeat catheterization to reassess her coronary anatomy.   DESCRIPTION OF OPERATION:  After obtaining informed consent, patient was  brought to the cardiac cath lab.  Right and left groins were shaved,  prepped and draped in the usual sterile fashion.  ECG monitor was  established.  Using modified sterile technique,  a number 6-French  arterial sheath was inserted into the right femoral artery.  A 6-French  diagnostic catheter was used to perform the diagnostic angiography.   The left main was a large vessel with no significant disease.   The LAD is a medium-sized vessel which coarse to 2  diagonal branches.  There is a stent noted in the distal mid portion of the LAD, which  appears widely patent.  There is noted to be a 30% spasm in the proximal  portion of the LAD, which is partially relieved with intracoronary  nitro.   The first diagonal was a medium to large vessel, which bifurcates  distally with no significant disease.   The second diagonal is a small vessel, which bifurcates in the mid  segment.  There is a 70% osteal lesion as well as a 99% lesion in the  midportion of the vessel at a bifurcation.  This appears to be a 1.5-  1.75 mm vessel.   The left circumflex is a large vessel course AV groove in the 3 obtuse  marginal branches.  There is a stent noted in the distal midportion of  the ABV circumflex, which appears to be widely patent.  There is mild  40% narrowing proximally to the stent.   The first OM is a small vessel with no significant disease.   The second OM is a large vessel, which  bifurcates in the midsegment with  20% osteal narrowing.   The third OM is a small to medium small vessel with no significant  disease.   The right coronary artery is a large vessel, which is dominant to the  PDS  with posterolateral branch.  There is a stent noted in the  midportion of the RCA, which appears to be widely patent.  There is 30%  narrowing proximal to the stent, and 50% narrowing distal to the stent.  The PDA and posterolateral branch have no significant disease.   The left ventriculogram reveals a preserved EF of 60%.   Hemodynamics:  __________  pressure 135/78, LV __________  pressure  135/4, LVDP of 8.   CONCLUSION:  1. Significant 1 vessel CAD involving a small diagonal branch, which      is believed to be best treated medically secondary to the vessel      size.  2. Normal LV systolic function.      Darlin Priestly, MD  Electronically Signed     RHM/MEDQ  D:  04/27/2006  T:  04/27/2006  Job:  [id number]   cc:   Sharlot Gowda, M.D.

## 2010-09-10 NOTE — Cardiovascular Report (Signed)
Haley George, Haley George NO.:  [id number]   MEDICAL RECORD NO.:  [id number]          PATIENT TYPE:  INP   LOCATION:  2926                         FACILITY:  [facility]   PHYSICIAN:  Nanetta Batty, M.D.   DATE OF BIRTH:  07/27/1955   DATE OF PROCEDURE:  05/05/2005  DATE OF DISCHARGE:                              CARDIAC CATHETERIZATION   Haley George is a 55 year old female with positive family history of heart  disease who had a negative Cardiolite this past summer admitted with chest  pain and positive enzymes.  She was pain free on IV heparin, nitroglycerin,  and Integrilin.  There are no acute EKG changes.  She presents now for  angiography and potential intervention.   DESCRIPTION OF PROCEDURE:  The patient was brought to the second floor [facility] in the postabsorptive state.  She was  premedicated with p.o. Valium, IV Versed  and Fentanyl.  Her right groin was  prepped and shaved in the usual sterile fashion.  1% Xylocaine was used for  local anesthesia.  A 6 French sheath was inserted into the right femoral  artery using standard Seldinger technique.  A 6 French right and left  Judkins diagnostic catheters as well as a [place] pigtail catheter were used  for selective coronary angiography, left ventriculography, subselective left  internal mammary artery angiography, distal abdominal aortography.  Visipaque dye was used for the entirety of the case.  Retrograde aortic and  left ventricular  pressures were recorded.   HEMODYNAMIC DATA:  1.  Aortic systolic pressure 134, diastolic pressure 83.  2.  Left ventricular systolic pressure 134, end diastolic pressure 14.   SELECTIVE CORONARY ANGIOGRAPHY:  1.  Left main normal.  2.  LAD had a fairly focal lesion at the intersection of the mid and distal      third.  3.  Circumflex was a nondominant vessel that gave off a fairly large LCXPLA.      There was a 99% hazy lesion in the AV groove  circumflex after the first      marginal branch which appeared to be an open vessel.  4.  Right coronary artery is a large dominant vessel with posterior take      off.  It was visualized and there was a 90% stenosis in the mid vessel      that was segmental in nature.  5.  Left ventriculography:  RAO left ventriculogram was performed using 25      mL of Visipaque dye at 12 mL per second.  The overall LVEF was estimated      at greater than 60% without focal wall motion abnormalities.  6.  Left internal mammary artery:  This vessel was subselectively visualized      and was widely patent.  It was suitable for use during coronary artery      bypass grafting.  7.  Distal abdominal aortography performed using 20 mL of Visipaque dye at      20 mL per second.  The renal arteries were widely patent.  The  infrarenal abdominal aorta and iliac bifurcation appeared free of      significant atherosclerotic changes.   IMPRESSION:  Haley George has three vessel disease, preserved LV function.  Her lesions are fairly focal.  We will proceed with PCI and stenting using  drug eluting stent.  The patient was on Integrilin initially.   INTERVENTION:  The existing 6 French sheath was exchanged over a wire for a  7 [place] sheath.  The 6 French sheath was inserted into the right femoral  vein.  The patient received 5000 units of heparin intravenously with an NASD  of 289.  Nitroglycerin was turned off during the case because of  hypotension.  Visipaque dye was used for the entirety of the intervention.  The patient was on aspirin and received 600 mg Plavix p.o.  Using a 7 [place]  JL3.5 guide catheter, a 190 Asahi soft wire, and a 20/12 Quantum Maverick,  PCI was performed on the mid circumflex.  Following this, a 25/18 Cypher was  then deployed at 12 atmospheres resulting in a reduction of a 99% mid  circumflex vessel to 0% residual with excellent flow.  The wire was then  placed down the LAD and the  LAD was dilated with the same 20/12 Maverick and  stented with a 22.5/12 Minivision at 14 atmospheres.  The vessel did not  appear to be large enough for a 25 drug eluting stent.   IMPRESSION:  Successful circumflex and LAD PCI and stenting using a drug  eluting stent in the circumflex and a bare metal stent in the LAD with  Integrilin.   The patient tolerated the procedure well.  The guide-wire and catheters were  removed.  The sheath was sewn securely in place.  The patient left the lab  in stable condition.  The plan will be to remove the sheath once his ACT  falls below 150.  Integrilin will continue until tomorrow when plans will be  for Dr. Jenne Campus to perform staged RCA intervention.  The patient left the  lab in stable condition.      Nanetta Batty, M.D.  Electronically Signed     JB/MEDQ  D:  05/05/2005  T:  05/06/2005  Job:  [id number]

## 2010-09-10 NOTE — Discharge Summary (Signed)
[name], [name] NO.:  [id number]   MEDICAL RECORD NO.:  [id number]          PATIENT TYPE:  INP   LOCATION:  2029                         FACILITY:  [facility]   PHYSICIAN:  Thereasa Solo. Little, M.D. DATE OF BIRTH:  Nov 24, 1955   DATE OF ADMISSION:  05/04/2005  DATE OF DISCHARGE:  05/08/2005                                 DISCHARGE SUMMARY   Haley George is a 55 year old female patient who came in with chest pain with  a non-ST-elevation MI.  She underwent cardiac catheterization by Dr.  Nanetta Batty on May 05, 2005.  She had high grade disease in her  circumflex and the LAD.  She underwent Mini-Vision stenting 2.25 x 12 in her  LAD and a 2.5 x 18 CYPHER in her circumflex.  It was decided that she should  undergo staged RCA procedure.  She was put on Integrilin, Plavix and  aspirin.  She then underwent staged procedure on May 06, 2005 with a  CYPHER stent 3.0 x 23 to her RCA by Dr. Julieanne Manson.  She had chest pain  that evening.  IV nitroglycerin was continued and the Integrilin was  continued.  The following morning, she was more stable.  Her CK-MB was not  elevated.  Her troponin was mildly elevated at 0.92.  Her blood pressure was  not high.  They had wanted to start an ACE and ARB, however, her blood  pressure did not permit this.  On May 08, 2005, she was seen by Dr.  Jenne Campus and considered stable for discharge home.  Her blood pressure was  117/43, heart rate 87, temperature 97.7.  She had labs drawn again on  May 08, 2005.  Her CK-MB was 75, 1.3.  Her troponin was 0.39.  Dr.  Clarene Duke discussed her previous medications and as an outpatient she  apparently had had an acute hepatic injury after starting Avecor and she is  statin intolerant.  Her triglycerides were 717, however, it was felt with  her recent hepatic injury not to start any further medication for her  triglycerides.  Since her blood pressure was low, it was decided to not  start  Diovan at the time of discharge and it would be followed as an  outpatient.  Also, her hemoglobin A1c was 6.5.  The week before she came  into the hospital, she did have a blood sugar ranging 221.  Has not been  that high.   DISCHARGE MEDICATIONS:  1.  Enteric-coated aspirin 325 mg a day.  2.  Plavix 75 mg a day.  3.  Lopressor 50 mg a half a tab twice a day.  4.  Xanax one every 12 hours as needed p.r.n.  5.  Nitroglycerin p.r.n. as needed.   LABS:  Sodium 136, potassium 4.1, BUN was 9, creatinine 0.8, glucose was  221.  This was nonfasting.  Hemoglobin was 10.3, hematocrit was 28.7, WBCs  were 7.1, platelets were 177.  CK-MB #1 269/25.7 with a troponin of 1.91 on  May 04, 2005.  On May 05, 2005, 240/19.9, troponin 1.25.  Number  three 209/15.1, troponin 1.61.  On May 06, 2005, CK-MB 82/3.  On May 07, 2005, 69/2.3 with a troponin of 0.92.  Total cholesterol is 170,  triglycerides 717, HDL is 21.  LDL not calculated.  TSH 2.267.   Chest x-ray shows negative chest.   DISCHARGE DIAGNOSES:  1.  Non-ST-elevation myocardial infarction.  2.  Cardiac catheterization with staged procedure May 05, 2005.  CYPHER      stent 2.5 x 18 placed to her circumflex and Mini-Vision 2.25 x 2 to her      left anterior descending by Dr. Nanetta Batty.  May 06, 2005, right      coronary artery CYPHER stent 3.0 x 23 placed by Dr. Julieanne Manson.  3.  Normal left ventricular function.  4.  Normal renal and iliac arteries.  5.  Dyslipidemia with low high density lipoprotein and high triglycerides,      intolerant to statins and recently placed on Avecor with acute hepatic      injury.  In the future, we might want to try on [place] and dietary      behavior changes and possibility addition of fenofibrate.  6.  Borderline hypotension.  Diovan not given at time of discharge.  To be      addressed as an outpatient.  7.  History of autoimmune hepatitis.  8.  History of nephrolithiasis.  i      Haley George, N.P.    ______________________________  Thereasa Solo. Little, M.D.    BB/MEDQ  D:  05/08/2005  T:  05/09/2005  Job:  [id number]

## 2010-09-10 NOTE — Op Note (Signed)
Haley George, Haley George NO.:  [id number]   MEDICAL RECORD NO.:  [id number]          PATIENT TYPE:  INP   LOCATION:  3712                         FACILITY:  [facility]   PHYSICIAN:  John C. Madilyn Fireman, M.D.    DATE OF BIRTH:  July 15, 1955   DATE OF PROCEDURE:  04/28/2006  DATE OF DISCHARGE:                               OPERATIVE REPORT   PROCEDURE:  Esophagogastroduodenoscopy.   INDICATION FOR PROCEDURE:  Left-sided chest pain with a negative cardiac  work-up, negative abdominal ultrasound, and previous history of peptic  ulcer disease.   PROCEDURE:  The patient was placed in the left lateral decubitus  position and placed on the pulse monitor with continuous low-flow oxygen  delivered by nasal cannula.  She was sedated with 75 mcg IV fentanyl and  7 mg IV Versed.  The Olympus video endoscope was advanced under direct  vision into the oropharynx and esophagus.  The esophagus was straight  and of normal caliber with the squamocolumnar line at 38 cm.  There was  no visible hiatal hernia, ring, stricture, or other abnormality of the  GE junction or distal esophagus.  The stomach was entered, and a small  amount of liquid secretions were suctioned from the fundus.  Retroflexed  view of the cardia was unremarkable.  The fundus and body appeared  normal.  The antrum showed minimal erythema and granularity with no  focal ulcers or erosions.  The pylorus was nondeformed and easily  allowed passage of the endoscope tip into the duodenum.  The distal bulb  showed a focal intense erythema and granularity with 1-2 areas of slight  amount of overlying exudate with no definite ulcer crater.  Careful  inspection revealed no definite ulcer, but I could not completely rule  out an ulcer in the blind spot in the immediate postbulbar duodenum.  Otherwise, the postbulbar duodenum appeared normal.  The scope was  withdrawn back into the stomach and a CLOtest obtained.  The scope was  then  withdrawn, and the patient returned to the recovery room in stable  condition.  She tolerated the procedure well, and there were no  immediate complications.   IMPRESSION:  1. Duodenitis.  2. Mild antral gastritis.   PLAN:  Await CLOtest and treat for eradication of Helicobacter if  positive.  Would seek other causes of her chest pain.           ______________________________  Everardo All. Madilyn Fireman, M.D.     JCH/MEDQ  D:  04/28/2006  T:  04/28/2006  Job:  [id number]   cc:   Nanetta Batty, M.D.  Fax: [phone]

## 2010-09-10 NOTE — Cardiovascular Report (Signed)
Haley George, Haley George NO.:  [id number]   MEDICAL RECORD NO.:  [id number]          PATIENT TYPE:  INP   LOCATION:  2926                         FACILITY:  [facility]   PHYSICIAN:  Thereasa Solo. Little, M.D. DATE OF BIRTH:  11/23/1955   DATE OF PROCEDURE:  05/06/2005  DATE OF DISCHARGE:                              CARDIAC CATHETERIZATION   This 55 year old female was admitted on May 04, 2005 with a non-Q-wave  MI. She was taking to the cath lab by Dr. Allyson Sabal on May 05, 2005 and had  a stent placed to her circumflex and a stent placed to her LAD system. She  had a residual 80% lesion in her RCA and she is brought back today for final  intervention of a staged three-vessel angioplasty.   After obtaining informed consent, the patient was prepped and draped in the  usual sterile fashion exposing the left groin. Following local anesthetic  with 1% Xylocaine, the Seldinger technique was employed and a 6-French  introducer sheath was placed into her left femoral artery. A JR-4 6-French  guide catheter with side holes was used. A short Luge wire was placed down  the right coronary artery. Primary stenting with a 3.0 x 23 Cypher stent was  then undertaken. Initial inflation was 16 atmospheres for 55 seconds; final  inflation 16 atmospheres for 55 seconds.   Postdilatation with a 3.25 x 18 mm long PowerSail balloon at 12 atmospheres  for 65 seconds repositioning the balloon at 14 atmospheres for 50 seconds  with a final dilatation. The stent was expanded to 3.37 mm. The area that  had been in the midportion of the right coronary artery 80% eccentrically  narrowed at about 20 mm in length now appeared to be normal with no residual  stenosis. There was TIMI III flow pre and post intervention. Intracoronary  nitroglycerin of 100 mcg was given.   The patient was started back on Integrilin IV with no bolus given because  she had been on Integrilin throughout the night. She was  given a bolus of  4300 of heparin and the ACT at the beginning procedure was 292 and at the  end of the procedure was 266. Sheath should be removed later today. We will  continue the IV Integrilin for 12 hours.           ______________________________  Thereasa Solo Little, M.D.     ABL/MEDQ  D:  05/06/2005  T:  05/07/2005  Job:  [id number]   cc:   Nanetta Batty, M.D.  Fax: [phone]   Catheter Lab   Sharlot Gowda, M.D.  Fax: [phone]

## 2010-09-10 NOTE — Consult Note (Signed)
Haley George, LABORDE NO.:  [id number]   MEDICAL RECORD NO.:  [id number]          PATIENT TYPE:  INP   LOCATION:  3712                         FACILITY:  [facility]   PHYSICIAN:  John C. Madilyn Fireman, M.D.    DATE OF BIRTH:  12-10-1955   DATE OF CONSULTATION:  04/28/2006  DATE OF DISCHARGE:                                 CONSULTATION   REQUESTING PHYSICIAN:  Dr. Allyson Sabal.   REASON FOR CONSULTATION:  Noncardiac chest pain.   HISTORY OF PRESENT ILLNESS:  This is a 55 year old female with a history  of an MI in 2007, as well as autoimmune hepatitis and duodenal ulcer  back in the early 1990s.  She is complaining of left-sided chest pain x3  months.  The discomfort/pressure is generally pervasive across her  entire upper chest with occasional intense pain just under her left  shoulder  That intense pain makes her stop in her tracks.  It does wake  her from sleep.  It lasts for hours.  She describes a new increasing  shortness of breath even without exertion.  Her pain is dissimilar to  previous pain that she has had with her autoimmune hepatitis, also  dissimilar to previous pain she has had with her duodenal ulcer.  She  has no abdominal tenderness.  She has negative changes in bowel  movements.  She has negative nausea, vomiting, diarrhea, hematemesis,  melena, hematochezia.  Negative NSAIDs with the exception of a 325 mg  aspirin daily.  Negative for alcohol.  Negative for tobacco.  She  describes her pain as not related to eating or physical activity.  She  also says her pain is eased with nitroglycerin.   PAST MEDICAL HISTORY:  Is significant for:  1. An MI in 2007, 3 stents placed.  2. A liver biopsy in 1997, that showed autoimmune hepatitis.  3. She has had surgical removal of kidney stones.  4. She had hemorrhoid surgery in 2001.  5. She has a history of gestational diabetes.  6. In October 2007, she was diagnosed with RSD, where she describes      the nerves in her  ankle and foot were damaged and that she has      hypersensitivity in that ankle and foot.   Her GI doctor is Dr. Vida Rigger.   PHYSICAL EXAMINATION:  GENERAL:  She is alert and oriented in no  apparent distress.  She has some chest pain on the left.  She is not  jaundiced.  CARDIOVASCULAR:  Regular rate and rhythm.  LUNGS:  Sound clear.  ABDOMEN:  Soft, nontender, nondistended.  No bowel sounds were  appreciated.  She has no hepatomegaly.  EXTREMITIES:  Show no edema.   LABORATORY:  Her CBC was normal with a hemoglobin of 11.9, white count  5.7.  Her Chem-7 was normal.   CURRENT MEDICATIONS:  Are per chart, but include:  1. Plavix.  2. As well as a 325 mg aspirin.   She is currently allergic to:  PENICILLIN.   And, has not been on a proton pump inhibitor at home.  ASSESSMENT:  This is a 55 year old pleasant female with chest pain x3  months.   She has been examined and history has been collected by Dr. Dorena Cookey  who has determined that initial evaluation will begin with an ultrasound  her abdomen to rule out any gallbladder disease and reevaluate her  liver.  She will also receive upper endoscopy today from Dr. Madilyn Fireman.      Stephani Police, PA    ______________________________  Everardo All Madilyn Fireman, M.D.    MLY/MEDQ  D:  04/28/2006  T:  04/28/2006  Job:  [id number]   cc:   Petra Kuba, M.D.  Darlin Priestly, MD

## 2010-09-21 ENCOUNTER — Emergency Department (HOSPITAL_COMMUNITY): Payer: BC Managed Care – PPO

## 2010-09-21 ENCOUNTER — Emergency Department (HOSPITAL_COMMUNITY)
Admission: EM | Admit: 2010-09-21 | Discharge: 2010-09-21 | Discharge status: Against Medical Advice | Payer: BC Managed Care – PPO | Attending: Emergency Medicine | Admitting: Emergency Medicine

## 2010-09-21 DIAGNOSIS — R110 Nausea: Secondary | ICD-10-CM | POA: Insufficient documentation

## 2010-09-21 DIAGNOSIS — R55 Syncope and collapse: Secondary | ICD-10-CM | POA: Insufficient documentation

## 2010-09-21 DIAGNOSIS — I252 Old myocardial infarction: Secondary | ICD-10-CM | POA: Insufficient documentation

## 2010-09-21 DIAGNOSIS — E119 Type 2 diabetes mellitus without complications: Secondary | ICD-10-CM | POA: Insufficient documentation

## 2010-09-21 LAB — TROPONIN I: Troponin I: <0.3 ng/mL (ref <0.30)

## 2010-09-21 LAB — DIFFERENTIAL
Lymphocytes Relative: 16 % (ref 12–46)
Lymphs Abs: 0.8 10*3/uL (ref 0.7–4.0)
Monocytes Relative: 7 % (ref 3–12)
Neutro Abs: 3.5 10*3/uL (ref 1.7–7.7)
Neutrophils Relative %: 71 % (ref 43–77)

## 2010-09-21 LAB — CBC
Hemoglobin: 11.6 g/dL — ABNORMAL LOW (ref 12.0–15.0)
MCH: 29.2 pg (ref 26.0–34.0)
MCV: 90.2 fL (ref 78.0–100.0)
RBC: 3.97 MIL/uL (ref 3.87–5.11)

## 2010-09-21 LAB — BASIC METABOLIC PANEL
BUN: 27 mg/dL — ABNORMAL HIGH (ref 6–23)
CO2: 26 mEq/L (ref 19–32)
Chloride: 101 mEq/L (ref 96–112)
Creatinine, Ser: 1.16 mg/dL (ref 0.4–1.2)
Potassium: 4.4 mEq/L (ref 3.5–5.1)

## 2010-09-21 LAB — CK TOTAL AND CKMB (NOT AT ARMC): CK, MB: 2.9 ng/mL (ref 0.3–4.0)

## 2010-09-24 ENCOUNTER — Encounter: Payer: Self-pay | Admitting: Family Medicine

## 2010-09-24 ENCOUNTER — Ambulatory Visit (INDEPENDENT_AMBULATORY_CARE_PROVIDER_SITE_OTHER): Payer: BC Managed Care – PPO | Admitting: Family Medicine

## 2010-09-24 VITALS — BP 140/74 | HR 96 | Temp 98.4°F | Ht 65.0 in | Wt 227.0 lb

## 2010-09-24 DIAGNOSIS — N184 Chronic kidney disease, stage 4 (severe): Secondary | ICD-10-CM | POA: Insufficient documentation

## 2010-09-24 DIAGNOSIS — I10 Essential (primary) hypertension: Secondary | ICD-10-CM | POA: Insufficient documentation

## 2010-09-24 DIAGNOSIS — E669 Obesity, unspecified: Secondary | ICD-10-CM | POA: Insufficient documentation

## 2010-09-24 DIAGNOSIS — J40 Bronchitis, not specified as acute or chronic: Secondary | ICD-10-CM

## 2010-09-24 DIAGNOSIS — E119 Type 2 diabetes mellitus without complications: Secondary | ICD-10-CM

## 2010-09-24 DIAGNOSIS — E1169 Type 2 diabetes mellitus with other specified complication: Secondary | ICD-10-CM

## 2010-09-24 DIAGNOSIS — E785 Hyperlipidemia, unspecified: Secondary | ICD-10-CM

## 2010-09-24 LAB — HM MAMMOGRAPHY: HM Mammogram: NEGATIVE

## 2010-09-24 MED ORDER — ERYTHROMYCIN BASE 500 MG PO TABS: 500.0000 mg | ORAL_TABLET | Freq: Two times a day (BID) | ORAL | Status: AC

## 2010-09-24 MED ORDER — CHLORPHENIRAMINE-HYDROCODONE 8-10 MG/5ML PO LQCR: 5.0000 mL | Freq: Two times a day (BID) | ORAL | Status: AC | PRN

## 2010-09-24 NOTE — Progress Notes (Signed)
  Subjective:    Patient ID: Haley George, female    DOB: October 12, 1955, 55 y.o.   MRN: [id number]  HPI on May 22 she came down with a sore throat and head congestion area and several days later she developed a productive cough. She had a temperature of up to 101. She does not smoke. She continues on the medicines listed in the chart except for blood pressure. She was in the hospital a couple days ago being treated for hypotension and nausea. Apparently the workup in the emergency room was negative. Checks her blood sugars every other day usually in the morning and they run around 120. She does check her feet periodically. Her last eye exam was in January    Review of Systems     Objective:   Physical Examalert and in no distress. Tympanic membranes and canals are normal. Throat is clear. Tonsils are normal. Neck is supple without adenopathy or thyromegaly. Cardiac exam shows a regular sinus rhythm without murmurs or gallops. Lungs are clear to auscultation. In the globe A1c is 6.9        Assessment & Plan:  Bronchitis. Diabetes. Hypertension. Obesity Placed on erythromycin and give Tussionex. Recommend she start back on her blood pressure medication.

## 2010-09-24 NOTE — Patient Instructions (Signed)
Take the antibiotic and the cough medicine. Continue on your other diabetes medicines as well as a blood pressure medicine. Return here in about 4 months. Call me if you have any problems with the bronchitis

## 2010-10-26 ENCOUNTER — Telehealth: Payer: Self-pay | Admitting: Family Medicine

## 2010-10-26 ENCOUNTER — Encounter: Payer: Self-pay | Admitting: Family Medicine

## 2010-10-26 ENCOUNTER — Ambulatory Visit (INDEPENDENT_AMBULATORY_CARE_PROVIDER_SITE_OTHER): Payer: BC Managed Care – PPO | Admitting: Family Medicine

## 2010-10-26 VITALS — BP 110/70 | HR 76 | Wt 218.0 lb

## 2010-10-26 DIAGNOSIS — R51 Headache: Secondary | ICD-10-CM

## 2010-10-26 MED ORDER — KETOROLAC TROMETHAMINE 60 MG/2ML IM SOLN
60.0000 mg | Freq: Once | INTRAMUSCULAR | Status: AC
  Administered 2010-10-26: 60 mg via INTRAMUSCULAR

## 2010-10-26 NOTE — Progress Notes (Signed)
  Subjective:    Patient ID: Haley George, female    DOB: 12-31-1955, 55 y.o.   MRN: [id number]  HPI she has a five-day history of a dull headache over her entire head, chest pressure and slight cough but no blurred vision, double vision, sneezing, itchy watery eyes, fever, chills, sore throat. The pressure sensation is different than the one she had when she had her cardiac issue. She does not smoke. She was seen by her cardiologist in February. She has tried Tylenol and ibuprofen with little relief of her symptoms.    Review of Systems negative except as above  Objective:   Physical Exam  Constitutional: She is oriented to person, place, and time. She appears well-developed and well-nourished.  HENT:  Head: Normocephalic.  Right Ear: External ear normal.  Left Ear: External ear normal.  Nose: Nose normal.  Mouth/Throat: Oropharynx is clear and moist.  Eyes: Conjunctivae and EOM are normal. Pupils are equal, round, and reactive to light. No scleral icterus.  Neck: Normal range of motion. Neck supple. No thyromegaly present.  Cardiovascular: Normal rate, regular rhythm and normal heart sounds.  Exam reveals no friction rub.   No murmur heard. Pulmonary/Chest: Effort normal and breath sounds normal.  Lymphadenopathy:    She has no cervical adenopathy.  Neurological: She is alert and oriented to person, place, and time. She has normal reflexes. She displays normal reflexes. A cranial nerve deficit is present. Coordination normal.  Skin: Skin is warm and dry.   funduscopic exam was normal        Assessment & Plan:  Headache, etiology unclear I will treat her initially with 800 mg Advil 3 times a day. She will keep track of her symptoms and call me if any changes. Recheck here on Monday.

## 2010-10-26 NOTE — Patient Instructions (Signed)
Use ibuprofen 800 mg 3 times a day. He can also use Tylenol. Keep track of any symptoms that might change. I see you again at the beginning of the week or sooner if we need to

## 2010-10-26 NOTE — Progress Notes (Signed)
Addended by: Lavell Islam on: 10/26/2010 05:07 PM   Modules accepted: Orders, Level of Service

## 2010-10-28 ENCOUNTER — Ambulatory Visit
Admission: RE | Admit: 2010-10-28 | Discharge: 2010-10-28 | Disposition: A | Discharge status: Home / Self Care | Payer: BC Managed Care – PPO | Source: Ambulatory Visit | Attending: Family Medicine | Admitting: Family Medicine

## 2010-10-28 ENCOUNTER — Telehealth: Payer: Self-pay

## 2010-10-28 ENCOUNTER — Other Ambulatory Visit: Payer: Self-pay | Admitting: Family Medicine

## 2010-10-28 DIAGNOSIS — R51 Headache: Secondary | ICD-10-CM

## 2010-10-28 NOTE — Telephone Encounter (Signed)
Called pt to give results she said she wanted to wait a little while see how headaches go and she would like to be referred to [facility] Dr.Stern

## 2010-10-28 NOTE — Telephone Encounter (Signed)
Called pt to see how she was doing pt said

## 2010-10-29 ENCOUNTER — Encounter: Payer: Self-pay | Admitting: Family Medicine

## 2010-10-29 NOTE — Telephone Encounter (Signed)
PT CAME BACK IN TO BE SEEN IN THE OFFICE

## 2010-11-01 ENCOUNTER — Ambulatory Visit: Payer: BC Managed Care – PPO | Admitting: Family Medicine

## 2010-11-18 ENCOUNTER — Other Ambulatory Visit: Payer: Self-pay

## 2010-11-18 MED ORDER — PIOGLITAZONE HCL-METFORMIN HCL 15-500 MG PO TABS: 1.0000 | ORAL_TABLET | Freq: Two times a day (BID) | ORAL | Status: DC

## 2011-01-07 ENCOUNTER — Other Ambulatory Visit: Payer: Self-pay | Admitting: Family Medicine

## 2011-02-07 LAB — CBC
HCT: 31.5 — ABNORMAL LOW
Hemoglobin: 10.9 — ABNORMAL LOW
MCV: 88.8
WBC: 8.4

## 2011-02-07 LAB — BASIC METABOLIC PANEL
BUN: 19
Calcium: 9
Creatinine, Ser: 1.22 — ABNORMAL HIGH
GFR calc Af Amer: 57 — ABNORMAL LOW
GFR calc non Af Amer: 47 — ABNORMAL LOW

## 2011-02-08 LAB — URINALYSIS, ROUTINE W REFLEX MICROSCOPIC
Glucose, UA: 500 — AB
Protein, ur: NEGATIVE
Specific Gravity, Urine: 1.021
Urobilinogen, UA: 0.2

## 2011-02-08 LAB — DIFFERENTIAL
Eosinophils Absolute: 0.3
Lymphocytes Relative: 19
Lymphs Abs: 1.2
Neutro Abs: 4.4
Neutrophils Relative %: 67

## 2011-02-08 LAB — COMPREHENSIVE METABOLIC PANEL
ALT: 27
BUN: 20
CO2: 29
Calcium: 10.3
Creatinine, Ser: 1.07
GFR calc non Af Amer: 55 — ABNORMAL LOW
Glucose, Bld: 239 — ABNORMAL HIGH
Total Protein: 8

## 2011-02-08 LAB — CBC
HCT: 36.3
Hemoglobin: 12.6
MCHC: 34.8
MCV: 89.6
RBC: 4.05
RDW: 13.4

## 2011-04-08 ENCOUNTER — Other Ambulatory Visit: Payer: Self-pay | Admitting: Family Medicine

## 2011-07-15 ENCOUNTER — Other Ambulatory Visit: Payer: Self-pay | Admitting: Family Medicine

## 2011-08-03 ENCOUNTER — Encounter: Payer: Self-pay | Admitting: Dermatology

## 2011-08-26 ENCOUNTER — Other Ambulatory Visit: Payer: Self-pay | Admitting: Family Medicine

## 2011-09-26 ENCOUNTER — Other Ambulatory Visit: Payer: Self-pay | Admitting: Family Medicine

## 2011-09-27 NOTE — Telephone Encounter (Signed)
PATIENT NEEDS A OFFICE VISIT BEFORE HIS MEDICATION RUNS OUT OR NO MORE REFILLS.

## 2011-09-30 ENCOUNTER — Encounter: Payer: Self-pay | Admitting: Internal Medicine

## 2011-09-30 LAB — HM MAMMOGRAPHY: HM Mammogram: NEGATIVE

## 2011-10-06 ENCOUNTER — Encounter: Payer: BC Managed Care – PPO | Admitting: Family Medicine

## 2011-10-13 ENCOUNTER — Encounter: Payer: Self-pay | Admitting: Family Medicine

## 2011-10-13 ENCOUNTER — Ambulatory Visit (INDEPENDENT_AMBULATORY_CARE_PROVIDER_SITE_OTHER): Payer: BC Managed Care – PPO | Admitting: Family Medicine

## 2011-10-13 VITALS — BP 120/70 | Ht 64.5 in | Wt 224.0 lb

## 2011-10-13 DIAGNOSIS — E669 Obesity, unspecified: Secondary | ICD-10-CM

## 2011-10-13 DIAGNOSIS — I10 Essential (primary) hypertension: Secondary | ICD-10-CM

## 2011-10-13 DIAGNOSIS — E785 Hyperlipidemia, unspecified: Secondary | ICD-10-CM

## 2011-10-13 DIAGNOSIS — E119 Type 2 diabetes mellitus without complications: Secondary | ICD-10-CM

## 2011-10-13 DIAGNOSIS — E1169 Type 2 diabetes mellitus with other specified complication: Secondary | ICD-10-CM

## 2011-10-13 MED ORDER — PIOGLITAZONE HCL-METFORMIN HCL 15-500 MG PO TABS: 1.0000 | ORAL_TABLET | Freq: Two times a day (BID) | ORAL | Status: DC

## 2011-10-13 NOTE — Progress Notes (Signed)
Subjective:    Patient ID: Haley George, female    DOB: [birth year], 56 y.o.   MRN: [id number]  HPI She is here for complete examination. She has underlying heart disease and seizure cardiologist regularly. The medical record indicates that most of her care has been through the cardiologist. She comes here intermittently for general care and her diabetes. She does not exercise regularly nor does she eat appropriately. She blames all of this on her lifestyle mainly revolving around work stress. She feels quite trapped in her job and states that in approximately one year she will be able to retire but then states she will need to continue her because her daughter will be getting ready to go to college. She does have underlying reflux disease and has remote history of ulcer disease in 1993 which was apparently an NSAID-induced. Is also remote history of autoimmune hepatitis versus drug-induced. She was on insulin however her most recent hemoglobin A1c is 7.0 and she states that this was without insulin. She also complains of difficulty with sleep disturbance. She says this is especially true on Sunday before she goes back to work.   Review of Systems     Objective:   Physical Exam BP 120/70  Ht 5' 4.5" (1.638 m)  Wt 224 lb (101.606 kg)  BMI 37.86 kg/m2  General Appearance:    Alert, cooperative, no distress, appears stated age  Head:    Normocephalic, without obvious abnormality, atraumatic  Eyes:    PERRL, conjunctiva/corneas clear, EOM's intact, fundi    benign  Ears:    Normal TM's and external ear canals  Nose:   Nares normal, mucosa normal, no drainage or sinus   tenderness  Throat:   Lips, mucosa, and tongue normal; teeth and gums normal  Neck:   Supple, no lymphadenopathy;  thyroid:  no   enlargement/tenderness/nodules; no carotid   bruit or JVD  Back:    Spine nontender, no curvature, ROM normal, no CVA     tenderness  Lungs:     Clear to auscultation bilaterally without wheezes,  rales or     ronchi; respirations unlabored  Chest Wall:    No tenderness or deformity   Heart:    Regular rate and rhythm, S1 and S2 normal, no murmur, rub   or gallop  Breast Exam:    Deferred to GYN  Abdomen:     Soft, non-tender, nondistended, normoactive bowel sounds,    no masses, no hepatosplenomegaly  Genitalia:    Deferred to GYN     Extremities:   No clubbing, cyanosis or edema  Pulses:   2+ and symmetric all extremities  Skin:   Skin color, texture, turgor normal, no rashes or lesions  Lymph nodes:   Cervical, supraclavicular, and axillary nodes normal  Neurologic:   CNII-XII intact, normal strength, sensation and gait; reflexes 2+ and symmetric throughout          Psych:   Normal mood, affect, hygiene and grooming.          Assessment & Plan:   1. Hypertension associated with diabetes   2. Diabetes mellitus   3. Hyperlipidemia LDL goal <70   4. Obesity (BMI 30-39.9)    since she cannot exercise nor can do the dietary modification mainly revolving around the work stress and her inability to really change this. She states that if she can get someone to force her to exercise and may create better she could potentially do this. I then explained  that counseling might potentially help and did recommend she followup with Darryl Hyers concerning this. I will renew her Actos. I do not think at this time she needs to be placed on insulin.

## 2011-10-13 NOTE — Patient Instructions (Signed)

## 2011-10-17 ENCOUNTER — Encounter: Payer: Self-pay | Admitting: Family Medicine

## 2012-01-03 ENCOUNTER — Encounter (HOSPITAL_COMMUNITY): Payer: Self-pay | Admitting: *Deleted

## 2012-01-03 ENCOUNTER — Emergency Department (HOSPITAL_COMMUNITY)
Admission: EM | Admit: 2012-01-03 | Discharge: 2012-01-03 | Disposition: A | Discharge status: Home / Self Care | Payer: BC Managed Care – PPO | Attending: Emergency Medicine | Admitting: Emergency Medicine

## 2012-01-03 ENCOUNTER — Emergency Department (HOSPITAL_COMMUNITY): Payer: BC Managed Care – PPO

## 2012-01-03 DIAGNOSIS — I2510 Atherosclerotic heart disease of native coronary artery without angina pectoris: Secondary | ICD-10-CM | POA: Insufficient documentation

## 2012-01-03 DIAGNOSIS — Z87442 Personal history of urinary calculi: Secondary | ICD-10-CM | POA: Insufficient documentation

## 2012-01-03 DIAGNOSIS — M545 Low back pain, unspecified: Secondary | ICD-10-CM | POA: Insufficient documentation

## 2012-01-03 DIAGNOSIS — I10 Essential (primary) hypertension: Secondary | ICD-10-CM | POA: Insufficient documentation

## 2012-01-03 DIAGNOSIS — R319 Hematuria, unspecified: Secondary | ICD-10-CM | POA: Insufficient documentation

## 2012-01-03 DIAGNOSIS — R109 Unspecified abdominal pain: Secondary | ICD-10-CM | POA: Insufficient documentation

## 2012-01-03 DIAGNOSIS — J45909 Unspecified asthma, uncomplicated: Secondary | ICD-10-CM | POA: Insufficient documentation

## 2012-01-03 DIAGNOSIS — N390 Urinary tract infection, site not specified: Secondary | ICD-10-CM | POA: Insufficient documentation

## 2012-01-03 DIAGNOSIS — E785 Hyperlipidemia, unspecified: Secondary | ICD-10-CM | POA: Insufficient documentation

## 2012-01-03 HISTORY — DX: Atherosclerotic heart disease of native coronary artery without angina pectoris: I25.10

## 2012-01-03 LAB — POCT I-STAT, CHEM 8
Calcium, Ion: 1.19 mmol/L (ref 1.12–1.23)
Glucose, Bld: 301 mg/dL — ABNORMAL HIGH (ref 70–99)
HCT: 36 % (ref 36.0–46.0)
Hemoglobin: 12.2 g/dL (ref 12.0–15.0)

## 2012-01-03 LAB — CBC WITH DIFFERENTIAL/PLATELET
Eosinophils Absolute: 0.2 10*3/uL (ref 0.0–0.7)
Eosinophils Relative: 2 % (ref 0–5)
Lymphocytes Relative: 11 % — ABNORMAL LOW (ref 12–46)
Lymphs Abs: 0.9 10*3/uL (ref 0.7–4.0)
MCHC: 32.1 g/dL (ref 30.0–36.0)
Monocytes Absolute: 0.4 10*3/uL (ref 0.1–1.0)
Monocytes Relative: 5 % (ref 3–12)
Neutrophils Relative %: 81 % — ABNORMAL HIGH (ref 43–77)
Platelets: 221 10*3/uL (ref 150–400)
RDW: 14.8 % (ref 11.5–15.5)

## 2012-01-03 LAB — URINE MICROSCOPIC-ADD ON

## 2012-01-03 LAB — URINALYSIS, ROUTINE W REFLEX MICROSCOPIC
Ketones, ur: 15 mg/dL — AB
Nitrite: POSITIVE — AB
Specific Gravity, Urine: 1.015 (ref 1.005–1.030)
Urobilinogen, UA: >8 mg/dL — ABNORMAL HIGH (ref 0.0–1.0)

## 2012-01-03 MED ORDER — HYDROMORPHONE HCL PF 1 MG/ML IJ SOLN
1.0000 mg | Freq: Once | INTRAMUSCULAR | Status: AC
  Administered 2012-01-03: 1 mg via INTRAVENOUS
  Filled 2012-01-03: qty 1

## 2012-01-03 MED ORDER — KETOROLAC TROMETHAMINE 30 MG/ML IJ SOLN
30.0000 mg | Freq: Once | INTRAMUSCULAR | Status: AC
  Administered 2012-01-03: 30 mg via INTRAVENOUS
  Filled 2012-01-03: qty 1

## 2012-01-03 MED ORDER — ONDANSETRON HCL 4 MG/2ML IJ SOLN
4.0000 mg | Freq: Once | INTRAMUSCULAR | Status: AC
  Administered 2012-01-03: 4 mg via INTRAVENOUS
  Filled 2012-01-03: qty 2

## 2012-01-03 MED ORDER — ONDANSETRON HCL 4 MG/2ML IJ SOLN
INTRAMUSCULAR | Status: AC
  Filled 2012-01-03: qty 2

## 2012-01-03 MED ORDER — PROMETHAZINE HCL 25 MG/ML IJ SOLN
12.5000 mg | Freq: Once | INTRAMUSCULAR | Status: AC
  Administered 2012-01-03: 12.5 mg via INTRAVENOUS
  Filled 2012-01-03 (×2): qty 1

## 2012-01-03 MED ORDER — ONDANSETRON HCL 4 MG/2ML IJ SOLN
4.0000 mg | Freq: Once | INTRAMUSCULAR | Status: AC
  Administered 2012-01-03: 4 mg via INTRAVENOUS

## 2012-01-03 MED ORDER — SODIUM CHLORIDE 0.9 % IV BOLUS (SEPSIS)
1000.0000 mL | Freq: Once | INTRAVENOUS | Status: AC
  Administered 2012-01-03: 1000 mL via INTRAVENOUS

## 2012-01-03 MED ORDER — ACETAZOLAMIDE SODIUM 500 MG IJ SOLR: 500.0000 mg | Freq: Once | INTRAMUSCULAR | Status: DC

## 2012-01-03 MED ORDER — CIPROFLOXACIN HCL 250 MG PO TABS: 250.0000 mg | ORAL_TABLET | Freq: Two times a day (BID) | ORAL | Status: AC

## 2012-01-03 MED ORDER — ONDANSETRON HCL 4 MG PO TABS: 4.0000 mg | ORAL_TABLET | Freq: Four times a day (QID) | ORAL | Status: AC

## 2012-01-03 MED ORDER — HYDROCODONE-ACETAMINOPHEN 5-325 MG PO TABS: 1.0000 | ORAL_TABLET | ORAL | Status: AC | PRN

## 2012-01-03 MED ORDER — CIPROFLOXACIN IN D5W 400 MG/200ML IV SOLN
400.0000 mg | Freq: Once | INTRAVENOUS | Status: AC
  Administered 2012-01-03: 400 mg via INTRAVENOUS
  Filled 2012-01-03: qty 200

## 2012-01-03 NOTE — ED Notes (Signed)
Showed urine to Dr. Hyacinth Meeker

## 2012-01-03 NOTE — ED Notes (Signed)
Pt to ED with hx of 6 renal calculi.  Pt states she awoke with blood in her urine around 11 pm and since then has had increasing L flank pain that is  Moving to her LL abdomen.

## 2012-01-03 NOTE — ED Provider Notes (Signed)
Pt continues to have L flank pain and nausea. Discussed with Dr Patsi Sears. Adivsed IV abx and PA will see pt in ED.   Pt states she is feeling better. Eval'd y urology and cleared to f/u as outpt. Return for worsening symptoms or cocerns  Loren Racer, MD 01/03/12 1140

## 2012-01-03 NOTE — ED Provider Notes (Signed)
History     CSN: [id number]  Arrival date & time 01/03/12  4098   First MD Initiated Contact with Patient 01/03/12 [phone]      Chief Complaint  Patient presents with  . Nephrolithiasis    (Consider location/radiation/quality/duration/timing/severity/associated sxs/prior treatment) HPI Comments: Pt has a hx of Kidney Stones in the past and has been told that she has had "narrow ureters" and has had to have stents, lithotripsy etc.  She has not had this in some time, states that last evening she was having some mild low back aching but at 11 PM had acute onset of R sided sharp and stabbing radiating (to R groin) flank pain.  It is intermittent, currently 7/10, though at worst was 9/10 and associated with nasuea.  She states that she has had hematuria and that this is similar to prior episodes of kidney stones.  The history is provided by the patient and the spouse.    Past Medical History  Diagnosis Date  . Hypertension   . Asthma   . Liver failure   . Hyperlipidemia   . Kidney stones   . Psoriasis   . Depression   . Coronary artery disease     Past Surgical History  Procedure Date  . Ureteral stents   . Cardiac stents     No family history on file.  History  Substance Use Topics  . Smoking status: Never Smoker   . Smokeless tobacco: Never Used  . Alcohol Use: No    OB History    Grav Para Term Preterm Abortions TAB SAB Ect Mult Living                  Review of Systems  All other systems reviewed and are negative.    Allergies  Penicillins  Home Medications   Current Outpatient Rx  Name Route Sig Dispense Refill  . ASPIRIN 81 MG PO TABS Oral Take 81 mg by mouth daily.    Marland Kitchen CALCIUM CARBONATE 600 MG PO TABS Oral Take 600 mg by mouth daily.      Marland Kitchen CLOPIDOGREL BISULFATE 75 MG PO TABS Oral Take 75 mg by mouth daily.      Marland Kitchen CO-ENZYME Q-10 50 MG PO CAPS Oral Take 50 mg by mouth daily.      Marland Kitchen EZETIMIBE 10 MG PO TABS Oral Take 10 mg by mouth daily.      .  FENOFIBRATE 145 MG PO TABS Oral Take 145 mg by mouth daily.      . OMEGA-3 FATTY ACIDS 1000 MG PO CAPS Oral Take 2 g by mouth daily.      . INSULIN GLARGINE 100 UNIT/ML [place] SOLN Subcutaneous Inject 10-12 Units into the skin as needed.      . ISOSORBIDE MONONITRATE ER 30 MG PO TB24 Oral Take 30 mg by mouth daily.    Marland Kitchen METOPROLOL TARTRATE 25 MG PO TABS Oral Take 25 mg by mouth daily.      Marland Kitchen OMEPRAZOLE 20 MG PO CPDR Oral Take 20 mg by mouth daily. 1 pill 3 x week    . PIOGLITAZONE HCL-METFORMIN HCL 15-500 MG PO TABS Oral Take 1 tablet by mouth 2 (two) times daily with a meal. 60 tablet 5  . RANOLAZINE ER 1000 MG PO TB12 Oral Take 500 mg by mouth 2 (two) times daily.      Marland Kitchen ROSUVASTATIN CALCIUM 20 MG PO TABS Oral Take 20 mg by mouth daily.      Marland Kitchen  VALSARTAN 160 MG PO TABS Oral Take 160 mg by mouth daily. Pt takes 1/2 pill qd    . VITAMIN C 500 MG PO TABS Oral Take 500 mg by mouth daily.        BP 142/70  Pulse 86  Temp 97.2 F (36.2 C) (Oral)  Resp 16  SpO2 98%  Physical Exam  Nursing note and vitals reviewed. Constitutional: She appears well-developed and well-nourished.       Uncomfortable appearing  HENT:  Head: Normocephalic and atraumatic.  Mouth/Throat: Oropharynx is clear and moist. No oropharyngeal exudate.  Eyes: Conjunctivae and EOM are normal. Pupils are equal, round, and reactive to light. Right eye exhibits no discharge. Left eye exhibits no discharge. No scleral icterus.  Neck: Normal range of motion. Neck supple. No JVD present. No thyromegaly present.  Cardiovascular: Normal rate, regular rhythm, normal heart sounds and intact distal pulses.  Exam reveals no gallop and no friction rub.   No murmur heard. Pulmonary/Chest: Effort normal and breath sounds normal. No respiratory distress. She has no wheezes. She has no rales.  Abdominal: Soft. Bowel sounds are normal. She exhibits no distension and no mass. There is no tenderness.       No CVA tenderness  Musculoskeletal:  Normal range of motion. She exhibits no edema and no tenderness.  Lymphadenopathy:    She has no cervical adenopathy.  Neurological: She is alert. Coordination normal.  Skin: Skin is warm and dry. No rash noted. No erythema.  Psychiatric: She has a normal mood and affect. Her behavior is normal.    ED Course  Procedures (including critical care time)   Labs Reviewed  URINALYSIS, ROUTINE W REFLEX MICROSCOPIC   No results found.   No diagnosis found.    MDM  At this time the patient feels uncomfortable however her abdomen is soft and nontender. 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 CT scan ordered due to the patient's history of "narrow ureters" pain medication ordered, reevaluate.  No fever, no tachycardia and normal BP - she has frank hematuria on UA and has CT findings c/w either, hematuria or infection.  Check CBC, fluids, reeval.  She has ambulated to the bathroom independently.  Pain still 5/10 after medicines.  Change of shift - care signed out to Dr. Ranae Palms.     Vida Roller, MD 01/03/12 [phone]

## 2012-01-03 NOTE — Consult Note (Signed)
Urology Consult   Physician requesting consult: Hyacinth Meeker  Reason for consult: Gross hematuria and left flank pain  History of Present Illness: Haley George is a 56 y.o. cauc female with PMH significant for nephrolithiasis, HTN, CAD, and asthma who presented to the ED at 5 am this morning with c/o of left flank pain and gross hematuria that began at 11pm last night.  She has a hx of stones and states that this pain was like episodes she had in the past.  She took Motrin 800mg  with some alleviation of pain.  She had no sx prior to 11pm last night.  She developed N/V early this morning and the pain became unbearable so she came to ED.  She states that prior to receiving pain medication in ED her pain had improved but not resolved.  CT scan showed no hydro or stones but did show an increased density of the urine in the left renal collecting system suggesting blood or infection.  UA is positive for UTI and gross hematuria.  She is AF and her WBC and CR are WNL.    She is currently resting comfortably in the ED.  Her complaints are of nausea and soreness on the left side.  She denies F/C, SOB, CP, diarrhea/constipation, abdominal pain, dysuria, hesitancy, nocturia, and feelings of incomplete emptying.   Past Medical History  Diagnosis Date  . Hypertension   . Asthma   . Liver failure   . Hyperlipidemia   . Kidney stones   . Psoriasis   . Depression   . Coronary artery disease     Past Surgical History  Procedure Date  . Ureteral stents   . Cardiac stents   lithotripsy   Current Hospital Medications: Scheduled Meds:   . ciprofloxacin  400 mg Intravenous Once  .  HYDROmorphone (DILAUDID) injection  1 mg Intravenous Once  .  HYDROmorphone (DILAUDID) injection  1 mg Intravenous Once  . ketorolac  30 mg Intravenous Once  . ondansetron (ZOFRAN) IV  4 mg Intravenous Once  . ondansetron  4 mg Intravenous Once  . promethazine  12.5 mg Intravenous Once  . sodium chloride  1,000 mL  Intravenous Once  . DISCONTD: acetaZOLAMIDE  500 mg Intravenous Once   Continuous Infusions:  PRN Meds:.  Allergies:  Allergies  Allergen Reactions  . Penicillins Hives    No family history on file.  Social History:  reports that she has never smoked. She has never used smokeless tobacco. She reports that she does not drink alcohol or use illicit drugs.  ROS: A complete review of systems was performed.  All systems are negative except for pertinent findings as noted.  Physical Exam:  Vital signs in last 24 hours: Temp:  [97.2 F (36.2 C)] 97.2 F (36.2 C) (09/10 0531) Pulse Rate:  [81-86] 81  (09/10 0752) Resp:  [16-18] 18  (09/10 0752) BP: (141-142)/(70-71) 141/71 mmHg (09/10 0752) SpO2:  [98 %-99 %] 99 % (09/10 0752) General:  Alert and oriented,  No acute distress HEENT: Normocephalic, atraumatic Neck: No JVD or lymphadenopathy Cardiovascular: Regular rate and rhythm Lungs: Clear bilaterally Abdomen: Soft, nontender, nondistended, no abdominal masses Back: No CVA tenderness; some mild tenderness low back Extremities: No edema Neurologic: Grossly intact; somewhat slow mentation due to pain medication,   Laboratory Data:   [facility] 01/03/12 0727 01/03/12 0604  WBC 7.8 --  HGB 11.7* 12.2  HCT 36.5 36.0  PLT 221 --     Basename 01/03/12 0604  NA  139  K 4.9  CL 104  GLUCOSE 301*  BUN 27*  CALCIUM --  CREATININE 1.30*     Results for orders placed during the hospital encounter of 01/03/12 (from the past 24 hour(s))  POCT I-STAT, CHEM 8     Status: Abnormal   Collection Time   01/03/12  6:04 AM      Component Value Range   Sodium 139  135 - 145 mEq/L   Potassium 4.9  3.5 - 5.1 mEq/L   Chloride 104  96 - 112 mEq/L   BUN 27 (*) 6 - 23 mg/dL   Creatinine, Ser 4.54 (*) 0.50 - 1.10 mg/dL   Glucose, Bld 098 (*) 70 - 99 mg/dL   Calcium, Ion 1.19  1.47 - 1.23 mmol/L   TCO2 25  0 - 100 mmol/L   Hemoglobin 12.2  12.0 - 15.0 g/dL   HCT 82.9  56.2 - 13.0 %    URINALYSIS, ROUTINE W REFLEX MICROSCOPIC     Status: Abnormal   Collection Time   01/03/12  7:26 AM      Component Value Range   Color, Urine RED (*) YELLOW   APPearance TURBID (*) CLEAR   Specific Gravity, Urine 1.015  1.005 - 1.030   pH 7.0  5.0 - 8.0   Glucose, UA 500 (*) NEGATIVE mg/dL   Hgb urine dipstick LARGE (*) NEGATIVE   Bilirubin Urine MODERATE (*) NEGATIVE   Ketones, ur 15 (*) NEGATIVE mg/dL   Protein, ur >865 (*) NEGATIVE mg/dL   Urobilinogen, UA >7.8 (*) 0.0 - 1.0 mg/dL   Nitrite POSITIVE (*) NEGATIVE   Leukocytes, UA MODERATE (*) NEGATIVE  URINE MICROSCOPIC-ADD ON     Status: Abnormal   Collection Time   01/03/12  7:26 AM      Component Value Range   Squamous Epithelial / LPF FEW (*) RARE   WBC, UA 11-20  <3 WBC/hpf   RBC / HPF TOO NUMEROUS TO COUNT  <3 RBC/hpf   Bacteria, UA MANY (*) RARE   Crystals CA OXALATE CRYSTALS (*) NEGATIVE   Urine-Other MUCOUS PRESENT    CBC WITH DIFFERENTIAL     Status: Abnormal   Collection Time   01/03/12  7:27 AM      Component Value Range   WBC 7.8  4.0 - 10.5 K/uL   RBC 4.28  3.87 - 5.11 MIL/uL   Hemoglobin 11.7 (*) 12.0 - 15.0 g/dL   HCT 46.9  62.9 - 52.8 %   MCV 85.3  78.0 - 100.0 fL   MCH 27.3  26.0 - 34.0 pg   MCHC 32.1  30.0 - 36.0 g/dL   RDW 41.3  24.4 - 01.0 %   Platelets 221  150 - 400 K/uL   Neutrophils Relative 81 (*) 43 - 77 %   Neutro Abs 6.3  1.7 - 7.7 K/uL   Lymphocytes Relative 11 (*) 12 - 46 %   Lymphs Abs 0.9  0.7 - 4.0 K/uL   Monocytes Relative 5  3 - 12 %   Monocytes Absolute 0.4  0.1 - 1.0 K/uL   Eosinophils Relative 2  0 - 5 %   Eosinophils Absolute 0.2  0.0 - 0.7 K/uL   Basophils Relative 0  0 - 1 %   Basophils Absolute 0.0  0.0 - 0.1 K/uL   No results found for this or any previous visit (from the past 240 hour(s)).  Renal Function:  Basename 01/03/12 0604  CREATININE 1.30*  The CrCl is unknown because both a height and weight (above a minimum accepted value) are required for this  calculation.  Radiologic Imaging: Ct Abdomen Pelvis Wo Contrast  01/03/2012  *RADIOLOGY REPORT*  Clinical Data: Blood in the urine.  Left flank pain.  Previous history of kidney stones.  CT ABDOMEN AND PELVIS WITHOUT CONTRAST  Technique:  Multidetector CT imaging of the abdomen and pelvis was performed following the standard protocol without intravenous contrast.  Comparison: 08/20/2004  Findings: Lung bases are clear.  There is no pyelocaliectasis or ureterectasis.  No renal, ureteral, or bladder stones demonstrated.  The left renal collecting system appears somewhat hyper dense which may suggest hemorrhage or milk of calcium.  Changes could be due to infection or sequela of recently passed stone.  Focal low attenuation change in the upper pole of the left kidney laterally the visualized but likely represents a cyst.  This measures about 1.6 cm diameter.  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  The unenhanced appearance of the liver, spleen, gallbladder, pancreas, adrenal glands, and retroperitoneal lymph nodes is unremarkable.  Calcification of the aorta without aneurysm. Prominent visceral adipose tissues.  No free air or free fluid in the abdomen.  The stomach, small bowel, and colon are decompressed.  Pelvis:  Uterus and adnexal structures are not enlarged.  The appendix is normal.  No diverticulitis.  No free or loculated pelvic fluid collections.  Degenerative changes in the spine. Calcified phleboliths.  IMPRESSION: No renal or ureteral stone or obstruction demonstrated.  There is increased density of the urine in the left renal collecting system suggesting blood or infection.   Original Report Authenticated By: Marlon Pel, M.D.     I independently reviewed the above imaging studies as did Dr. Patsi Sears.  Impression/Assessment:  Gross hematuria and flank pain in pt with UTI and hx of nephrolithiasis  Plan:  Gross hematuria is due  to one of the following:  1.) hemorrhagic cystitis  2.) she may have passed a small left sided kidney stone prior to CT  3.) a combination of both.  Her WBC is WNL and she is AF.  Her pain had started to improve prior to CT and has continued to get better with pain medication.  She does still have persistent nausea.  Recommend d/c pt home with Cipro 500mg  BID x 7 days, pain medication, and anti-emetic.  Her urine should be cultured and she will have a f/u appt in our office early next week.  She knows to call our office prn.    Case discussed with Dr. Patsi Sears who is in agreement with assessment/plan.   Silas Flood 01/03/2012, 10:12 AM

## 2012-01-03 NOTE — ED Notes (Signed)
C/o L flank pain, onset 2300, also nv, hematuria, h/o kidney stones.

## 2012-01-06 LAB — URINE CULTURE

## 2012-01-07 NOTE — ED Notes (Signed)
+  Urine. Patient treated with Cipro. Sensitive to same. Per protocol MD. °

## 2012-03-28 ENCOUNTER — Ambulatory Visit: Payer: BC Managed Care – PPO | Admitting: Medical

## 2012-03-28 ENCOUNTER — Encounter: Payer: Self-pay | Admitting: Medical

## 2012-03-28 ENCOUNTER — Ambulatory Visit (INDEPENDENT_AMBULATORY_CARE_PROVIDER_SITE_OTHER): Payer: BC Managed Care – PPO | Admitting: Medical

## 2012-03-28 VITALS — BP 112/74 | HR 86 | Temp 98.5°F | Resp 14 | Wt 216.0 lb

## 2012-03-28 DIAGNOSIS — R319 Hematuria, unspecified: Secondary | ICD-10-CM

## 2012-03-28 DIAGNOSIS — N390 Urinary tract infection, site not specified: Secondary | ICD-10-CM

## 2012-03-28 DIAGNOSIS — M549 Dorsalgia, unspecified: Secondary | ICD-10-CM

## 2012-03-28 DIAGNOSIS — R110 Nausea: Secondary | ICD-10-CM

## 2012-03-28 LAB — POCT URINALYSIS DIPSTICK
Bilirubin, UA: NEGATIVE
Blood, UA: 250
Ketones, UA: NEGATIVE
Spec Grav, UA: 1.025
pH, UA: 6

## 2012-03-28 MED ORDER — SULFAMETHOXAZOLE-TRIMETHOPRIM 800-160 MG PO TABS: 1.0000 | ORAL_TABLET | Freq: Two times a day (BID) | ORAL | Status: DC

## 2012-03-28 MED ORDER — HYDROCODONE-ACETAMINOPHEN 5-325 MG PO TABS
1.0000 | ORAL_TABLET | Freq: Four times a day (QID) | ORAL | Status: DC | PRN
Start: 2012-03-28 — End: 2012-12-03

## 2012-03-28 MED ORDER — ONDANSETRON HCL 4 MG PO TABS: 4.0000 mg | ORAL_TABLET | Freq: Three times a day (TID) | ORAL | Status: DC | PRN

## 2012-03-28 NOTE — Progress Notes (Addendum)
Subjective: Here for urinary tract infection.  She was seen in the emergency dept 12/2011 for urinary tract infection with severe pain and gross hematuria.   Ended up seeing urology, Dr. Desmond Dike for f/u on hemorrhagic cystitis.   Was on 2 rounds of cipro.  Here today for 3 day hx/o back pain, some blood in urine, occasional "shock like pain" in the urethra, nausea, worsening pain, odorous urine.  No burning with urination, no abdominal pain, no bowel changes, no fever.  No other c/o.    Past Medical History  Diagnosis Date  . Hypertension   . Asthma   . Liver failure   . Hyperlipidemia   . Kidney stones   . Psoriasis   . Depression   . Coronary artery disease   . Diabetes mellitus without complication    ROS as in HPI    Objective:   Physical Exam  Filed Vitals:   03/28/12 1624  BP: 112/74  Pulse: 86  Temp: 98.5 F (36.9 C)  Resp: 14    General appearance: alert, no distress, WD/WN Back: +left CVA tenderness Abdomen: +bs, soft, non tender, non distended, no masses, no hepatomegaly, no splenomegaly Pulses: 2+ symmetric   Assessment and Plan :    Encounter Diagnoses  Name Primary?  . Urinary tract infection Yes  . Hematuria   . Back pain   . Nausea    Reviewed 12/2011 ED visit for urinary tract infection.  reviewed urine culture from that visit.  Urinalysis today suggests repeat UTI.  Begin 10 day course of Bactrim for complicated UTI, possible pyelonephritis.  Hydrocodone for pain, Zofran for nausea at her request.  Repeat morning clean catch urinalysis in 2 wk.  Recheck if worse or not improving in the next 2-3 days. Culture sent.

## 2012-03-31 LAB — CULTURE, URINE COMPREHENSIVE

## 2012-05-01 ENCOUNTER — Other Ambulatory Visit: Payer: Self-pay | Admitting: Family Medicine

## 2012-07-13 ENCOUNTER — Other Ambulatory Visit: Payer: Self-pay | Admitting: Family Medicine

## 2012-08-20 ENCOUNTER — Other Ambulatory Visit: Payer: Self-pay | Admitting: Family Medicine

## 2012-09-28 ENCOUNTER — Other Ambulatory Visit: Payer: Self-pay | Admitting: Family Medicine

## 2012-11-07 ENCOUNTER — Other Ambulatory Visit: Payer: Self-pay | Admitting: Family Medicine

## 2012-11-07 ENCOUNTER — Telehealth: Payer: Self-pay | Admitting: *Deleted

## 2012-11-07 NOTE — Telephone Encounter (Signed)
Called patient to let her know that I received a refill request for her actoplusmet came over and I filled 30 days worth but she is overdue for CPE with Dr.Lalonde. Needs to schedule CPE asap.

## 2012-11-15 ENCOUNTER — Other Ambulatory Visit: Payer: Self-pay | Admitting: Cardiovascular Disease

## 2012-11-15 LAB — HEMOGLOBIN A1C
Hgb A1c MFr Bld: 8.2 % — ABNORMAL HIGH (ref <5.7)
Mean Plasma Glucose: 189 mg/dL — ABNORMAL HIGH (ref <117)

## 2012-11-16 LAB — LIPID PANEL: Total CHOL/HDL Ratio: 6.4 Ratio

## 2012-11-16 LAB — COMPREHENSIVE METABOLIC PANEL
ALT: 40 U/L — ABNORMAL HIGH (ref 0–35)
Albumin: 4.3 g/dL (ref 3.5–5.2)
CO2: 28 mEq/L (ref 19–32)
Potassium: 4.7 mEq/L (ref 3.5–5.3)
Sodium: 138 mEq/L (ref 135–145)
Total Bilirubin: 0.5 mg/dL (ref 0.3–1.2)
Total Protein: 7.5 g/dL (ref 6.0–8.3)

## 2012-11-16 LAB — LDL CHOLESTEROL, DIRECT: Direct LDL: 93 mg/dL

## 2012-12-03 ENCOUNTER — Encounter: Payer: Self-pay | Admitting: Family Medicine

## 2012-12-03 ENCOUNTER — Ambulatory Visit (INDEPENDENT_AMBULATORY_CARE_PROVIDER_SITE_OTHER): Payer: BC Managed Care – PPO | Admitting: Family Medicine

## 2012-12-03 VITALS — BP 126/80 | HR 77 | Ht 65.0 in | Wt 224.0 lb

## 2012-12-03 DIAGNOSIS — I10 Essential (primary) hypertension: Secondary | ICD-10-CM

## 2012-12-03 DIAGNOSIS — E669 Obesity, unspecified: Secondary | ICD-10-CM

## 2012-12-03 DIAGNOSIS — Z0000 Encounter for general adult medical examination without abnormal findings: Secondary | ICD-10-CM

## 2012-12-03 DIAGNOSIS — I2510 Atherosclerotic heart disease of native coronary artery without angina pectoris: Secondary | ICD-10-CM | POA: Insufficient documentation

## 2012-12-03 DIAGNOSIS — H9313 Tinnitus, bilateral: Secondary | ICD-10-CM

## 2012-12-03 DIAGNOSIS — G479 Sleep disorder, unspecified: Secondary | ICD-10-CM

## 2012-12-03 DIAGNOSIS — E785 Hyperlipidemia, unspecified: Secondary | ICD-10-CM

## 2012-12-03 DIAGNOSIS — L409 Psoriasis, unspecified: Secondary | ICD-10-CM | POA: Insufficient documentation

## 2012-12-03 DIAGNOSIS — L408 Other psoriasis: Secondary | ICD-10-CM

## 2012-12-03 DIAGNOSIS — Z9119 Patient's noncompliance with other medical treatment and regimen: Secondary | ICD-10-CM

## 2012-12-03 DIAGNOSIS — H9319 Tinnitus, unspecified ear: Secondary | ICD-10-CM

## 2012-12-03 DIAGNOSIS — E1169 Type 2 diabetes mellitus with other specified complication: Secondary | ICD-10-CM

## 2012-12-03 DIAGNOSIS — E119 Type 2 diabetes mellitus without complications: Secondary | ICD-10-CM

## 2012-12-03 LAB — CBC WITH DIFFERENTIAL/PLATELET
Basophils Absolute: 0 10*3/uL (ref 0.0–0.1)
Basophils Relative: 0 % (ref 0–1)
MCHC: 33.2 g/dL (ref 30.0–36.0)
Neutro Abs: 2.8 10*3/uL (ref 1.7–7.7)
Neutrophils Relative %: 61 % (ref 43–77)
RDW: 13 % (ref 11.5–15.5)

## 2012-12-03 MED ORDER — INSULIN GLARGINE 100 UNIT/ML SOLOSTAR PEN: 10.0000 [IU]/d | PEN_INJECTOR | SUBCUTANEOUS | Status: DC

## 2012-12-03 MED ORDER — PIOGLITAZONE HCL-METFORMIN HCL 15-850 MG PO TABS: 1.0000 | ORAL_TABLET | Freq: Two times a day (BID) | ORAL | Status: DC

## 2012-12-03 NOTE — Patient Instructions (Signed)

## 2012-12-03 NOTE — Progress Notes (Signed)
Subjective:    Patient ID: Haley George, female    DOB: 18-Oct-1955, 57 y.o.   MRN: [id number]  HPI He is here for complete examination. She does see her cardiologist fairly regularly and has blood work done there. They're now in the Epic system. Her last hemoglobin A1c was 8.3. She admits to a long work interfere with taking good care of herself. This interferes with her exercise as well as her dietary habits. She also states that she has a daughter that we'll be going to college and she wants to continue to work to help finance this. She does have difficulty with psoriasis and does see a dermatologist concerning this. She also complains of ringing in both ears and seems to be getting worse. She ran out of her Lantus and needs a refill on this. She will schedule to see an eye doctor in the near future. He is not checking her sugars regularly. She does complain of a several month history of chest tightness sensation that can occur several times per day and be anywhere from 20-60 minutes and time but is not associated with physical activity, no shortness of breath or diaphoresis with this. She has not discussed this with her cardiologist. She also has a history of UTI and states that she still has a bad odor and does have some difficulty with urge incontinence but mainly only at night. She also complains of difficulty with sleep stating that she usually worries about what she is going to do the next day which interferes with her sleep pattern.   Review of Systems  Constitutional: Negative.   HENT: Negative.   Eyes: Negative.   Respiratory: Negative.   Cardiovascular: Negative.   Gastrointestinal: Negative.   Endocrine: Negative.   Genitourinary: Negative.   Allergic/Immunologic: Negative.   Neurological: Negative.   Hematological: Negative.   Psychiatric/Behavioral: Negative.        Objective:   Physical Exam BP 126/80  Pulse 77  Ht 5\' 5"  (1.651 m)  Wt 224 lb (101.606 kg)  BMI 37.28  kg/m2  General Appearance:    Alert, cooperative, no distress, appears stated age  Head:    Normocephalic, without obvious abnormality, atraumatic  Eyes:    PERRL, conjunctiva/corneas clear, EOM's intact, fundi    benign  Ears:    Normal TM's and external ear canals  Nose:   Nares normal, mucosa normal, no drainage or sinus   tenderness  Throat:   Lips, mucosa, and tongue normal; teeth and gums normal  Neck:   Supple, no lymphadenopathy;  thyroid:  no   enlargement/tenderness/nodules; no carotid   bruit or JVD  Back:    Spine nontender, no curvature, ROM normal, no CVA     tenderness  Lungs:     Clear to auscultation bilaterally without wheezes, rales or     ronchi; respirations unlabored  Chest Wall:    No tenderness or deformity   Heart:    Regular rate and rhythm, S1 and S2 normal, no murmur, rub   or gallop  Breast Exam:    Deferred to GYN  Abdomen:     Soft, non-tender, nondistended, normoactive bowel sounds,    no masses, no hepatosplenomegaly  Genitalia:    Deferred to GYN     Extremities:   No clubbing, cyanosis or edema  Pulses:   2+ and symmetric all extremities  Skin:   Skin color, texture, turgor normal, no rashes or lesions  Lymph nodes:   Cervical, supraclavicular, and  axillary nodes normal  Neurologic:   CNII-XII intact, normal strength, sensation and gait; reflexes 2+ and symmetric throughout          Psych:   Normal mood, affect, hygiene and grooming.    EKG shows no acute changes      Assessment & Plan:  Routine general medical examination at a health care facility - Plan: CBC with Differential  Obesity (BMI 30-39.9)  Hypertension associated with diabetes  Hyperlipidemia LDL goal <70  Diabetes mellitus - Plan: Insulin Glargine (LANTUS SOLOSTAR) 100 UNIT/ML SOPN, pioglitazone-metformin (ACTOPLUS MET) 15-850 MG per tablet  ASHD (arteriosclerotic heart disease) - Plan: EKG 12-Lead  Psoriasis  Tinnitus of both ears  Personal history of noncompliance with  medical treatment, presenting hazards to health  Sleep disturbance  had a long discussion with her concerning taking better care of herself and not allowing work interfere with this. Strongly encouraged daily activity even if this is 5 minutes at a time during her breaks. Did not spend a lot of time discussing dietary habits. Encouraged her discussed chest discomfort with her cardiologist. Discussed the fact that we have no good therapies for the ringing in the ears. She continues to take care of the psoriasis with topical medications. I gave information concerning sleep disturbance again recommending making behavioral changes including writing down all things she has to do the next day rather than abscess on them at night.

## 2012-12-26 ENCOUNTER — Other Ambulatory Visit: Payer: Self-pay | Admitting: *Deleted

## 2012-12-26 MED ORDER — ISOSORBIDE MONONITRATE ER 30 MG PO TB24: 30.0000 mg | ORAL_TABLET | Freq: Every day | ORAL | Status: DC

## 2012-12-26 NOTE — Telephone Encounter (Signed)
Rx was sent to pharmacy electronically. 

## 2013-01-04 ENCOUNTER — Encounter: Payer: Self-pay | Admitting: *Deleted

## 2013-01-07 ENCOUNTER — Ambulatory Visit (INDEPENDENT_AMBULATORY_CARE_PROVIDER_SITE_OTHER): Payer: BC Managed Care – PPO | Admitting: Cardiovascular Disease

## 2013-01-07 ENCOUNTER — Encounter: Payer: Self-pay | Admitting: Cardiovascular Disease

## 2013-01-07 VITALS — BP 132/77 | HR 76 | Resp 16 | Ht 65.0 in | Wt 225.2 lb

## 2013-01-07 DIAGNOSIS — I200 Unstable angina: Secondary | ICD-10-CM | POA: Insufficient documentation

## 2013-01-07 DIAGNOSIS — E1159 Type 2 diabetes mellitus with other circulatory complications: Secondary | ICD-10-CM

## 2013-01-07 DIAGNOSIS — I10 Essential (primary) hypertension: Secondary | ICD-10-CM

## 2013-01-07 DIAGNOSIS — Z79899 Other long term (current) drug therapy: Secondary | ICD-10-CM

## 2013-01-07 DIAGNOSIS — E785 Hyperlipidemia, unspecified: Secondary | ICD-10-CM

## 2013-01-07 DIAGNOSIS — E782 Mixed hyperlipidemia: Secondary | ICD-10-CM

## 2013-01-07 DIAGNOSIS — I2510 Atherosclerotic heart disease of native coronary artery without angina pectoris: Secondary | ICD-10-CM

## 2013-01-07 DIAGNOSIS — R5381 Other malaise: Secondary | ICD-10-CM

## 2013-01-07 DIAGNOSIS — R079 Chest pain, unspecified: Secondary | ICD-10-CM

## 2013-01-07 DIAGNOSIS — E1169 Type 2 diabetes mellitus with other specified complication: Secondary | ICD-10-CM

## 2013-01-07 DIAGNOSIS — E119 Type 2 diabetes mellitus without complications: Secondary | ICD-10-CM

## 2013-01-07 MED ORDER — OMEPRAZOLE 20 MG PO CPDR: 20.0000 mg | DELAYED_RELEASE_CAPSULE | ORAL | Status: DC

## 2013-01-07 MED ORDER — OMEPRAZOLE 20 MG PO CPDR: 20.0000 mg | DELAYED_RELEASE_CAPSULE | Freq: Every day | ORAL | Status: DC

## 2013-01-07 NOTE — Assessment & Plan Note (Signed)
Asked her to go to the emergency room for any episodes of chest discomfort that last more than 20 minutes and are not alleviated by 3 successive sublingual nitroglycerin tablets. She is already taking beta blockers, long-acting nitrates and maximum tolerated dose and Ranexa.  Note that she takes proton pump inhibitors on a chronic basis. If she requires future events physician procedures Brilinta or Effient are better choices than Plavix.

## 2013-01-07 NOTE — Assessment & Plan Note (Signed)
Complex previous revascularization history:  2007  2.5 x 18 mm cipher stent in the distal left circumflex/oblique marginal 2   2.25 x 12 mm mini vision in the mid to distal LAD artery and staged 3.0 x 23 mm cipher stent mid RCA 2008  all stents patent with proximal LAD spasm and 99% stenosis in the second diagonal artery 2010  2.25 x 12 Taxus stent proximal to previous LAD stent and and 2.0 x 12 mini vision stent distal to previous LAD stent (another 2.0 x 12 mm mini vision stent deployed within the old LAD stent - migration during deployment)   2.5 by 16 Taxus stent proximal to previous oblique marginal stent 2012  normal myocardial perfusion study   She is again having resting chest pain and is clearly terrified that she may have recurrent in-stent restenosis. The symptoms have occurred at rest but her presentation has always been atypical. I have reassured her that more than 2 years after her last stent procedure this is unlikely, but unfortunately cannot exclude a new coronary stenoses. Many of her coronary risk factors are not fully remedy, including poor glycemic control and persistent hypertriglyceridemia as well as sedentary lifestyle and obesity. Her echocardiogram is low risk. I recommended that she have a repeat LexiScan Myoview study. She has had extensive previous revascularization procedures and I do not think we can rely on a simple treadmill stress test ECG.

## 2013-01-07 NOTE — Patient Instructions (Addendum)
Your physician has requested that you have a lexiscan myoview. For further information please visit [email or web address] Please follow instruction sheet, as given.  Your physician recommends that you return for lab work in: 6 months  Your physician recommends that you schedule a follow-up appointment in: 6 months

## 2013-01-07 NOTE — Assessment & Plan Note (Signed)
LDL cholesterol values have deteriorated and are no longer within the desirable range. Unfortunately I don't think further augmentation of her pharmacological management is the solution. She really needs to change her lifestyle improve her diet exercise and lose weight. As before she describes the limitations imposed by her work and problems with arthritis but I think this time she understands that she really has to behave differently.

## 2013-01-07 NOTE — Assessment & Plan Note (Signed)
Glycemic control is still suboptimal which probably explains the persistent hypertriglyceridemia. She is already on combination therapy with a highly potent statin, full dose TriCor and that zetia. Niacin would probably counterproductive as far as glycemic control even though it might help her lipids

## 2013-01-07 NOTE — Progress Notes (Signed)
Patient ID: Haley George, female   DOB: December 10, 1955, 57 y.o.   MRN: [id number]     Reason for office visit CAD, chest pain at rest  Haley George is again having problems with chest discomfort. In the last month or so she has had 4 separate episodes of chest pain all occurring at rest while she was working on her computer. In between these episodes she was able to perform some yard work without chest discomfort. The symptoms are quite reminiscent of previous angina when she received stents. Her most recent nuclear stress test was performed in January of 2012 and was normal. She has had previous percutaneous revascularization procedures performed in all 3 major coronary arteries and has had problems with restenosis at the edges of previously placed stents requiring repeat PCI. The last interventions were performed in 2010 when she had overlapping stents placed at the level of old or stents in the mid LAD artery and in the left circumflex coronary artery. She used to have reasonably good diabetes control but recently glycemic control has worsened and her last hemoglobin A1c was 8.2%. This has been associated with worsening triglyceride levels and worsening LDL cholesterol levels.       Allergies  Allergen Reactions  . Penicillins Hives  . Statins     Current Outpatient Prescriptions  Medication Sig Dispense Refill  . aspirin 81 MG tablet Take 81 mg by mouth daily.      Marland Kitchen co-enzyme Q-10 50 MG capsule Take 50 mg by mouth daily.        Marland Kitchen ezetimibe (ZETIA) 10 MG tablet Take 10 mg by mouth daily.        . fenofibrate (TRICOR) 145 MG tablet Take 145 mg by mouth daily.        . fish oil-omega-3 fatty acids 1000 MG capsule Take 2 g by mouth 2 (two) times daily.       . Insulin Glargine (LANTUS SOLOSTAR) 100 UNIT/ML SOPN Inject 10 Units/day into the skin 1 day or 1 dose.  5 pen  5  . isosorbide mononitrate (IMDUR) 30 MG 24 hr tablet Take 1 tablet (30 mg total) by mouth daily.  30 tablet  8  . KRILL  OIL 1000 MG CAPS Take by mouth.      . metoprolol succinate (TOPROL-XL) 25 MG 24 hr tablet Take 37.5 mg by mouth daily.       . Multiple Vitamin (MULTIVITAMIN WITH MINERALS) TABS Take 1 tablet by mouth daily.      . nitroGLYCERIN (NITROSTAT) 0.4 MG SL tablet Place 0.4 mg under the tongue every 5 (five) minutes as needed for chest pain.      Marland Kitchen omeprazole (PRILOSEC) 20 MG capsule Take 1 capsule (20 mg total) by mouth daily.  30 capsule  6  . pioglitazone-metformin (ACTOPLUS MET) 15-850 MG per tablet Take 1 tablet by mouth 2 (two) times daily with a meal.  60 tablet  5  . ranolazine (RANEXA) 1000 MG SR tablet Take 1,000 mg by mouth 2 (two) times daily.       . rosuvastatin (CRESTOR) 20 MG tablet Take 20 mg by mouth daily.        . valsartan (DIOVAN) 160 MG tablet Take 80 mg by mouth daily.      . vitamin C (ASCORBIC ACID) 500 MG tablet Take 500 mg by mouth daily.        No current facility-administered medications for this visit.    Past Medical History  Diagnosis  Date  . Hypertension   . Asthma   . Liver failure     h/o autoimmune hepatitis   . Hyperlipidemia   . Kidney stones   . Psoriasis   . Depression   . Coronary artery disease   . Diabetes mellitus without complication     type 2  . Obesity   . History of nuclear stress test 05/21/2010    dipyridamole; normal perfusion, preserved LV systolic EF of 66%    Past Surgical History  Procedure Laterality Date  . Ureteral stents    . Coronary angioplasty with stent placement  09/2008    2.25x20mm Cypher DES to in-stent restenosis of prox LAD; Mini-Vision stent x2 2.0x25mm to area distal of initial LAD stent; 3 2.5x47mm Taxus stents prox to Cypher stent in circumflex  . Coronary angioplasty with stent placement  09/2005    Cypher DES 3.0x81mm to distal RCA; 2.25x47mm Mini0Vision stent to mid LAD; 2.5x48mm Cypher stent to circumflex  . Transthoracic echocardiogram  04/2010    EF=>55%, borderline conc LVH; trace MR & TR; AV mildly  sclerotic; aortic root sclerosis/calcif  . Carotid doppler  10/2008    R & L ICAs 0-49% diameter reduction     No family history on file.  History   Social History  . Marital Status: Single    Spouse Name: N/A    Number of Children: 1  . Years of Education: N/A   Occupational History  .     Social History Main Topics  . Smoking status: Never Smoker   . Smokeless tobacco: Never Used  . Alcohol Use: No  . Drug Use: No  . Sexual Activity: Not on file   Other Topics Concern  . Not on file   Social History Narrative  . No narrative on file    Review of systems: The patient specifically denies any chest pain with exertion, dyspnea at rest (is no change in chronic functional class II dyspnea with exertion), orthopnea, paroxysmal nocturnal dyspnea, syncope, palpitations, focal neurological deficits, intermittent claudication, lower extremity edema, unexplained weight gain, cough, hemoptysis or wheezing.  The patient also denies abdominal pain, nausea, vomiting, dysphagia, diarrhea, constipation, polyuria, polydipsia, dysuria, hematuria, frequency, urgency, abnormal bleeding or bruising, fever, chills, unexpected weight changes, mood swings, change in skin or hair texture, change in voice quality, auditory or visual problems, allergic reactions or rashes, new musculoskeletal complaints other than usual "aches and pains".   PHYSICAL EXAM BP 132/77  Pulse 76  Resp 16  Ht 5\' 5"  (1.651 m)  Wt 225 lb 3.2 oz (102.15 kg)  BMI 37.48 kg/m2  General: Alert, oriented x3, no distress, auditory obese Head: no evidence of trauma, PERRL, EOMI, no exophtalmos or lid lag, no myxedema, no xanthelasma; normal ears, nose and oropharynx Neck: normal jugular venous pulsations and no hepatojugular reflux; brisk carotid pulses without delay and no carotid bruits Chest: clear to auscultation, no signs of consolidation by percussion or palpation, normal fremitus, symmetrical and full respiratory  excursions Cardiovascular: normal position and quality of the apical impulse, regular rhythm, normal first and second heart sounds, no murmurs, rubs or gallops Abdomen: no tenderness or distention, no masses by palpation, no abnormal pulsatility or arterial bruits, normal bowel sounds, no hepatosplenomegaly Extremities: no clubbing, cyanosis or edema; 2+ radial, ulnar and brachial pulses bilaterally; 2+ right femoral, posterior tibial and dorsalis pedis pulses; 2+ left femoral, posterior tibial and dorsalis pedis pulses; no subclavian or femoral bruits Neurological: grossly nonfocal   EKG: Normal  sinus rhythm, normal tracing  Lipid Panel     Component Value Date/Time   CHOL 166 11/15/2012 0829   TRIG 435* 11/15/2012 0829   HDL 26* 11/15/2012 0829   CHOLHDL 6.4 11/15/2012 0829   VLDL NOT CALC 11/15/2012 0829   LDLCALC Comment:   Not calculated due to Triglyceride >400. Suggest ordering Direct LDL (Unit Code: 16109).   Total Cholesterol/HDL Ratio:CHD Risk                        Coronary Heart Disease Risk Table                                        Men       Women          1/2 Average Risk              3.4        3.3              Average Risk              5.0        4.4           2X Average Risk              9.6        7.1           3X Average Risk             23.4       11.0 Use the calculated Patient Ratio above and the CHD Risk table  to determine the patient's CHD Risk. ATP III Classification (LDL):       < 100        mg/dL         Optimal      604 - 129     mg/dL         Near or Above Optimal      130 - 159     mg/dL         Borderline High      160 - 189     mg/dL         High       > 540        mg/dL         Very High   9/81/1914 0829    BMET    Component Value Date/Time   NA 138 11/15/2012 0829   K 4.7 11/15/2012 0829   CL 100 11/15/2012 0829   CO2 28 11/15/2012 0829   GLUCOSE 238* 11/15/2012 0829   BUN 18 11/15/2012 0829   CREATININE 1.02 11/15/2012 0829   CREATININE 1.30* 01/03/2012 0604    CALCIUM 9.7 11/15/2012 0829   GFRNONAA 49* 09/21/2010 1330   GFRAA  Value: 59        The eGFR has been calculated using the MDRD equation. This calculation has not been validated in all clinical situations. eGFR's persistently <60 mL/min signify possible Chronic Kidney Disease.* 09/21/2010 1330     ASSESSMENT AND PLAN Diabetes mellitus Glycemic control is still suboptimal which probably explains the persistent hypertriglyceridemia. She is already on combination therapy with a highly potent statin, full dose TriCor and that zetia. Niacin would probably counterproductive as far as glycemic control even though it might help her lipids  ASHD (  arteriosclerotic heart disease) Complex previous revascularization history:  2007  2.5 x 18 mm cipher stent in the distal left circumflex/oblique marginal 2   2.25 x 12 mm mini vision in the mid to distal LAD artery and staged 3.0 x 23 mm cipher stent mid RCA 2008  all stents patent with proximal LAD spasm and 99% stenosis in the second diagonal artery 2010  2.25 x 12 Taxus stent proximal to previous LAD stent and and 2.0 x 12 mini vision stent distal to previous LAD stent (another 2.0 x 12 mm mini vision stent deployed within the old LAD stent - migration during deployment)   2.5 by 16 Taxus stent proximal to previous oblique marginal stent 2012  normal myocardial perfusion study   She is again having resting chest pain and is clearly terrified that she may have recurrent in-stent restenosis. The symptoms have occurred at rest but her presentation has always been atypical. I have reassured her that more than 2 years after her last stent procedure this is unlikely, but unfortunately cannot exclude a new coronary stenoses. Many of her coronary risk factors are not fully remedy, including poor glycemic control and persistent hypertriglyceridemia as well as sedentary lifestyle and obesity. Her echocardiogram is low risk. I recommended that she have a repeat LexiScan  Myoview study. She has had extensive previous revascularization procedures and I do not think we can rely on a simple treadmill stress test ECG.  Hypertension associated with diabetes Fair blood pressure control  Hyperlipidemia LDL goal <70 LDL cholesterol values have deteriorated and are no longer within the desirable range. Unfortunately I don't think further augmentation of her pharmacological management is the solution. She really needs to change her lifestyle improve her diet exercise and lose weight. As before she describes the limitations imposed by her work and problems with arthritis but I think this time she understands that she really has to behave differently.  Unstable angina pectoris - possible Asked her to go to the emergency room for any episodes of chest discomfort that last more than 20 minutes and are not alleviated by 3 successive sublingual nitroglycerin tablets. She is already taking beta blockers, long-acting nitrates and maximum tolerated dose and Ranexa.  Note that she takes proton pump inhibitors on a chronic basis. If she requires future events physician procedures Brilinta or Effient are better choices than Plavix.     Orders Placed This Encounter  Procedures  . CBC  . HgB A1c  . Lipid Profile  . Comprehensive metabolic panel  . Myocardial Perfusion Imaging  . EKG 12-Lead   Meds ordered this encounter  Medications  . DISCONTD: omeprazole (PRILOSEC) 20 MG capsule    Sig: Take 1 capsule (20 mg total) by mouth every other day.    Dispense:  15 capsule    Refill:  6  . omeprazole (PRILOSEC) 20 MG capsule    Sig: Take 1 capsule (20 mg total) by mouth daily.    Dispense:  30 capsule    Refill:  6    Joon Pohle  Thurmon Fair, MD, [facility] [phone] office [phone] pager

## 2013-01-07 NOTE — Assessment & Plan Note (Signed)
Fair blood pressure control

## 2013-03-20 ENCOUNTER — Ambulatory Visit (HOSPITAL_COMMUNITY)
Admission: RE | Admit: 2013-03-20 | Discharge: 2013-03-20 | Disposition: A | Discharge status: Home / Self Care | Payer: BC Managed Care – PPO | Source: Ambulatory Visit | Attending: Cardiovascular Disease | Admitting: Cardiovascular Disease

## 2013-03-20 DIAGNOSIS — R0602 Shortness of breath: Secondary | ICD-10-CM | POA: Insufficient documentation

## 2013-03-20 DIAGNOSIS — I10 Essential (primary) hypertension: Secondary | ICD-10-CM | POA: Insufficient documentation

## 2013-03-20 DIAGNOSIS — R42 Dizziness and giddiness: Secondary | ICD-10-CM | POA: Insufficient documentation

## 2013-03-20 DIAGNOSIS — R5381 Other malaise: Secondary | ICD-10-CM | POA: Insufficient documentation

## 2013-03-20 DIAGNOSIS — E119 Type 2 diabetes mellitus without complications: Secondary | ICD-10-CM | POA: Insufficient documentation

## 2013-03-20 DIAGNOSIS — I252 Old myocardial infarction: Secondary | ICD-10-CM | POA: Insufficient documentation

## 2013-03-20 DIAGNOSIS — R002 Palpitations: Secondary | ICD-10-CM | POA: Insufficient documentation

## 2013-03-20 DIAGNOSIS — R079 Chest pain, unspecified: Secondary | ICD-10-CM | POA: Insufficient documentation

## 2013-03-20 DIAGNOSIS — J45909 Unspecified asthma, uncomplicated: Secondary | ICD-10-CM | POA: Insufficient documentation

## 2013-03-20 DIAGNOSIS — E669 Obesity, unspecified: Secondary | ICD-10-CM | POA: Insufficient documentation

## 2013-03-20 DIAGNOSIS — Z8249 Family history of ischemic heart disease and other diseases of the circulatory system: Secondary | ICD-10-CM | POA: Insufficient documentation

## 2013-03-20 DIAGNOSIS — Z9861 Coronary angioplasty status: Secondary | ICD-10-CM | POA: Insufficient documentation

## 2013-03-20 DIAGNOSIS — I2510 Atherosclerotic heart disease of native coronary artery without angina pectoris: Secondary | ICD-10-CM | POA: Insufficient documentation

## 2013-03-20 MED ORDER — TECHNETIUM TC 99M SESTAMIBI GENERIC - CARDIOLITE
30.0000 | Freq: Once | INTRAVENOUS | Status: AC | PRN
  Administered 2013-03-20: 30 via INTRAVENOUS

## 2013-03-20 MED ORDER — AMINOPHYLLINE 25 MG/ML IV SOLN
75.0000 mg | Freq: Once | INTRAVENOUS | Status: AC
  Administered 2013-03-20: 75 mg via INTRAVENOUS

## 2013-03-20 MED ORDER — TECHNETIUM TC 99M SESTAMIBI GENERIC - CARDIOLITE
10.0000 | Freq: Once | INTRAVENOUS | Status: AC | PRN
  Administered 2013-03-20: 10 via INTRAVENOUS

## 2013-03-20 MED ORDER — REGADENOSON 0.4 MG/5ML IV SOLN
0.4000 mg | Freq: Once | INTRAVENOUS | Status: AC
  Administered 2013-03-20: 0.4 mg via INTRAVENOUS

## 2013-03-20 NOTE — Procedures (Addendum)
[name] [address] [place] [address] [phone]  Cardiology Nuclear Med Study  Haley George is a 57 y.o. female     MRN : [id number]     DOB: 1956/02/19  Procedure Date: 03/20/2013  Nuclear Med Background Indication for Stress Test:  Evaluation for Ischemia History:  Asthma and CAD;MI-04/2005;STENT-2007,2008,2010 Cardiac Risk Factors: Family History - CAD, Hypertension, Lipids, NIDDM and Obesity  Symptoms:  Chest Pain, DOE, Fatigue, Light-Headedness and Palpitations   Nuclear Pre-Procedure Caffeine/Decaff Intake:  7:00pm NPO After: 5:00am   IV Site: R Forearm  IV 0.9% NS with Angio Cath:  22g  Chest Size (in):  N/A IV Started by: Emmit Pomfret, RN  Height: 5\' 5"  (1.651 m)  Cup Size: C  BMI:  Body mass index is 37.44 kg/(m^2). Weight:  225 lb (102.059 kg)   Tech Comments:  N/A    Nuclear Med Study 1 or 2 day study: 1 day  Stress Test Type:  Lexiscan  Order Authorizing Provider:  Thurmon Fair, MD   Resting Radionuclide: Technetium 30m Sestamibi  Resting Radionuclide Dose: 9.9 mCi   Stress Radionuclide:  Technetium 104m Sestamibi  Stress Radionuclide Dose: 31.2 mCi           Stress Protocol Rest HR: 78 Stress HR: 113  Rest BP: 148/86 Stress BP: 173/73  Exercise Time (min): n/a METS: n/a          Dose of Adenosine (mg):  n/a Dose of Lexiscan: 0.4 mg  Dose of Atropine (mg): n/a Dose of Dobutamine: n/a mcg/kg/min (at max HR)  Stress Test Technologist: Ernestene Mention, CCT Nuclear Technologist: Koren Shiver, CNMT   Rest Procedure:  Myocardial perfusion imaging was performed at rest 45 minutes following the intravenous administration of Technetium 36m Sestamibi. Stress Procedure:  The patient received IV Lexiscan 0.4 mg over 15-seconds.  Technetium 23m Sestamibi injected at 30-seconds.  Due to patient's shortness of breath, flushing and head pain, she was given IV Aminophylline 75 mg. Symptoms were  resolved during recovery. There were no significant changes with Lexiscan.  Quantitative spect images were obtained after a 45 minute delay.  Transient Ischemic Dilatation (Normal <1.22):  1.29 Lung/Heart Ratio (Normal <0.45):  0.36 QGS EDV:  62 ml QGS ESV:  23 ml LV Ejection Fraction: 64%  Rest ECG: NSR - Normal EKG  Stress ECG: No significant change from baseline ECG  QPS Raw Data Images:  Normal; no motion artifact; normal heart/lung ratio. Stress Images:  Small, indistinct proximal anterior wall defect Rest Images:  Small, indistinct proximal anterior wall defect Subtraction (SDS):  There is a fixed anteriour defect that is most consistent with breast attenuation.  Impression Exercise Capacity:  Lexiscan with no exercise. BP Response:  Normal blood pressure response. Clinical Symptoms:  Headache, flushed ECG Impression:  No significant ECG changes with Lexiscan. Comparison with Prior Nuclear Study: No significant change from previous study  Overall Impression:  Low risk stress nuclear study with very small anterior breast artifact, worse at rest than stress. No ischemia.  LV Wall Motion:  NL LV Function; NL Wall Motion; EF 64%.  Chrystie Nose, MD, [facility] Board Certified in Nuclear Cardiology Attending Cardiologist [facility]  Chrystie Nose, MD  03/20/2013 12:45 PM

## 2013-03-26 ENCOUNTER — Other Ambulatory Visit: Payer: Self-pay | Admitting: Cardiovascular Disease

## 2013-03-27 ENCOUNTER — Other Ambulatory Visit: Payer: Self-pay | Admitting: *Deleted

## 2013-03-27 MED ORDER — EZETIMIBE 10 MG PO TABS: 10.0000 mg | ORAL_TABLET | Freq: Every day | ORAL | Status: DC

## 2013-03-27 NOTE — Telephone Encounter (Signed)
Rx was sent to pharmacy electronically. 

## 2013-04-15 ENCOUNTER — Encounter: Payer: Self-pay | Admitting: Cardiology

## 2013-04-22 ENCOUNTER — Encounter: Payer: Self-pay | Admitting: Family Medicine

## 2013-04-22 ENCOUNTER — Ambulatory Visit (INDEPENDENT_AMBULATORY_CARE_PROVIDER_SITE_OTHER): Payer: BC Managed Care – PPO | Admitting: Family Medicine

## 2013-04-22 VITALS — BP 124/80 | HR 86 | Wt 217.0 lb

## 2013-04-22 DIAGNOSIS — N058 Unspecified nephritic syndrome with other morphologic changes: Secondary | ICD-10-CM

## 2013-04-22 DIAGNOSIS — E785 Hyperlipidemia, unspecified: Secondary | ICD-10-CM

## 2013-04-22 DIAGNOSIS — N390 Urinary tract infection, site not specified: Secondary | ICD-10-CM

## 2013-04-22 DIAGNOSIS — E119 Type 2 diabetes mellitus without complications: Secondary | ICD-10-CM

## 2013-04-22 DIAGNOSIS — E1121 Type 2 diabetes mellitus with diabetic nephropathy: Secondary | ICD-10-CM

## 2013-04-22 DIAGNOSIS — E1169 Type 2 diabetes mellitus with other specified complication: Secondary | ICD-10-CM

## 2013-04-22 DIAGNOSIS — Z9119 Patient's noncompliance with other medical treatment and regimen: Secondary | ICD-10-CM

## 2013-04-22 DIAGNOSIS — I10 Essential (primary) hypertension: Secondary | ICD-10-CM

## 2013-04-22 DIAGNOSIS — R82998 Other abnormal findings in urine: Secondary | ICD-10-CM

## 2013-04-22 DIAGNOSIS — E1129 Type 2 diabetes mellitus with other diabetic kidney complication: Secondary | ICD-10-CM

## 2013-04-22 DIAGNOSIS — E669 Obesity, unspecified: Secondary | ICD-10-CM

## 2013-04-22 DIAGNOSIS — E1159 Type 2 diabetes mellitus with other circulatory complications: Secondary | ICD-10-CM

## 2013-04-22 DIAGNOSIS — Z91199 Patient's noncompliance with other medical treatment and regimen due to unspecified reason: Secondary | ICD-10-CM

## 2013-04-22 DIAGNOSIS — R8290 Unspecified abnormal findings in urine: Secondary | ICD-10-CM

## 2013-04-22 DIAGNOSIS — I152 Hypertension secondary to endocrine disorders: Secondary | ICD-10-CM

## 2013-04-22 LAB — COMPREHENSIVE METABOLIC PANEL
ALT: 39 U/L — ABNORMAL HIGH (ref 0–35)
AST: 27 U/L (ref 0–37)
BUN: 15 mg/dL (ref 6–23)
Calcium: 9.6 mg/dL (ref 8.4–10.5)
Chloride: 100 mEq/L (ref 96–112)
Creat: 0.83 mg/dL (ref 0.50–1.10)
Glucose, Bld: 256 mg/dL — ABNORMAL HIGH (ref 70–99)
Potassium: 4.7 mEq/L (ref 3.5–5.3)
Total Bilirubin: 0.5 mg/dL (ref 0.3–1.2)

## 2013-04-22 LAB — POCT URINALYSIS DIPSTICK
Bilirubin, UA: NEGATIVE
Blood, UA: NEGATIVE
Glucose, UA: 250
Nitrite, UA: NEGATIVE
Urobilinogen, UA: NEGATIVE

## 2013-04-22 LAB — POCT UA - MICROALBUMIN
Creatinine, POC: 198.5 mg/dL
Microalbumin Ur, POC: 124.9 mg/L

## 2013-04-22 LAB — POCT GLYCOSYLATED HEMOGLOBIN (HGB A1C): Hemoglobin A1C: 8.7

## 2013-04-22 MED ORDER — CIPROFLOXACIN HCL 500 MG PO TABS: 500.0000 mg | ORAL_TABLET | Freq: Two times a day (BID) | ORAL | Status: DC

## 2013-04-22 NOTE — Progress Notes (Signed)
Subjective:    Haley George is a 57 y.o. female who presents for follow-up of Type 2 diabetes mellitus.    Home blood sugar records: ? SHE HAS NOT BEEN CHECKING VERY OFTEN  Current symptoms/problem NONE Daily foot checks:   Any foot concerns:no Last eye exam:  04/2012 NEXT ONE IN 2 WEEKS DR.GROAT   Medication compliance:poor; she admits to taking her pills but is not taking her insulin on a regular basis or checking her sugars regularly. Current diet: NONE Current exercise: WALKING very little Known diabetic complications: nephropathy and cardiovascular disease Cardiovascular risk factors: diabetes mellitus, dyslipidemia, hypertension, microalbuminuria, obesity (BMI >= 30 kg/m2) and sedentary lifestyle She also has continued to have difficulty with urinary urgency and incontinence since being seen in over a year ago for her UTI. She states that her inability to take good care of herself revolves around her very busy work schedule.  The following portions of the patient's history were reviewed and updated as appropriate: allergies, current medications, past medical history, past social history and problem list.  ROS as in subjective above    Objective:    Wt 217 lb (98.431 kg)  There were no vitals filed for this visit.  General appearence: alert, no distress, WD/WN Neck: supple, no lymphadenopathy, no thyromegaly, no masses Heart: RRR, normal S1, S2, no murmurs Lungs: CTA bilaterally, no wheezes, rhonchi, or rales Abdomen: +bs, soft, non tender, non distended, no masses, no hepatomegaly, no splenomegaly Pulses: 2+ symmetric, upper and lower extremities, normal cap refill Ext: no edema Foot exam:  Neuro: foot monofilament exam normal   Lab Review Lab Results  Component Value Date   HGBA1C 8.2* 11/15/2012   Lab Results  Component Value Date   CHOL 166 11/15/2012   HDL 26* 11/15/2012   LDLCALC Comment:   Not calculated due to Triglyceride >400. Suggest ordering Direct LDL  (Unit Code: 16109).   Total Cholesterol/HDL Ratio:CHD Risk                        Coronary Heart Disease Risk Table                                        Men       Women          1/2 Average Risk              3.4        3.3              Average Risk              5.0        4.4           2X Average Risk              9.6        7.1           3X Average Risk             23.4       11.0 Use the calculated Patient Ratio above and the CHD Risk table  to determine the patient's CHD Risk. ATP III Classification (LDL):       < 100        mg/dL         Optimal      604 -  129     mg/dL         Near or Above Optimal      130 - 159     mg/dL         Borderline High      160 - 189     mg/dL         High       > 161        mg/dL         Very High   0/96/0454   LDLDIRECT 93 11/15/2012   TRIG 435* 11/15/2012   CHOLHDL 6.4 11/15/2012   No results found for this basename: Concepcion Elk     Chemistry      Component Value Date/Time   NA 138 11/15/2012 0829   K 4.7 11/15/2012 0829   CL 100 11/15/2012 0829   CO2 28 11/15/2012 0829   BUN 18 11/15/2012 0829   CREATININE 1.02 11/15/2012 0829   CREATININE 1.30* 01/03/2012 0604      Component Value Date/Time   CALCIUM 9.7 11/15/2012 0829   ALKPHOS 44 11/15/2012 0829   AST 46* 11/15/2012 0829   ALT 40* 11/15/2012 0829   BILITOT 0.5 11/15/2012 0829        Chemistry      Component Value Date/Time   NA 138 11/15/2012 0829   K 4.7 11/15/2012 0829   CL 100 11/15/2012 0829   CO2 28 11/15/2012 0829   BUN 18 11/15/2012 0829   CREATININE 1.02 11/15/2012 0829   CREATININE 1.30* 01/03/2012 0604      Component Value Date/Time   CALCIUM 9.7 11/15/2012 0829   ALKPHOS 44 11/15/2012 0829   AST 46* 11/15/2012 0829   ALT 40* 11/15/2012 0829   BILITOT 0.5 11/15/2012 0829     Hemoglobin A1c is 8.7. Urine microscopic did show white cells and red blood cells.  Last optometry/ophthalmology exam reviewed from:    Assessment:   Encounter Diagnoses  Name Primary?  . Diabetes  mellitus Yes  . Abnormal urine odor   . Personal history of noncompliance with medical treatment, presenting hazards to health   . Diabetic nephropathy   . UTI (urinary tract infection)   . Hypertension associated with diabetes   . Hyperlipidemia LDL goal <70   . Obesity (BMI 30-39.9)          Plan:    1.  Rx changes: none 2.  Education: Reviewed 'ABCs' of diabetes management (respective goals in parentheses):  A1C (<7), blood pressure (<130/80), and cholesterol (LDL <100). 3.  Compliance at present is estimated to be poor. Efforts to improve compliance (if necessary) will be directed at Improvement in diet, exercise and blood sugar checking. 4. Follow up: 4 months  Return here in 2 weeks for repeat urinalysis. Had a long discussion with her concerning the fact that she tends to put work prior to his head of every other aspect of her life including taking good care of herself. She does recognize this. Does plan on making some changes but plans to wait to October when she will retire to do further work. I again explained that she is not placing a high enough priority on her health is explained that now she does have evidence of kidney damage from her diabetes. Discussed the risk of this getting worse and leading to dialysis.

## 2013-04-22 NOTE — Patient Instructions (Signed)
Right down your goals for the next year and make them clearly visible

## 2013-04-23 LAB — LIPID PANEL
HDL: 21 mg/dL — ABNORMAL LOW (ref >39)
Total CHOL/HDL Ratio: 6.8 Ratio

## 2013-05-06 ENCOUNTER — Ambulatory Visit (INDEPENDENT_AMBULATORY_CARE_PROVIDER_SITE_OTHER): Payer: BC Managed Care – PPO | Admitting: Family Medicine

## 2013-05-06 ENCOUNTER — Encounter: Payer: Self-pay | Admitting: Family Medicine

## 2013-05-06 VITALS — BP 130/84 | HR 77 | Wt 216.0 lb

## 2013-05-06 DIAGNOSIS — N390 Urinary tract infection, site not specified: Secondary | ICD-10-CM

## 2013-05-06 DIAGNOSIS — E119 Type 2 diabetes mellitus without complications: Secondary | ICD-10-CM

## 2013-05-06 NOTE — Progress Notes (Signed)
   Subjective:    Patient ID: Haley George, female    DOB: 1955/05/08, 58 y.o.   MRN: [id number]  HPI She is here for a recheck. She is no longer having any urgency frequency or dysuria. She also has made major effort in getting her diabetes under better control. She has made some dietary changes and has increased her insulin. She is now up to 20 units and has her morning blood sugars in the 140 range. She does plan to get it down under 120.   Review of Systems     Objective:   Physical Exam Alert and in no distress. Urine dipstick was essentially negative.       Assessment & Plan:  Diabetes mellitus  UTI (urinary tract infection)  I encouraged her to continue with her dietary modification. Discussed slowly increasing her physical activities especially at work. She will attempt to do this.

## 2013-05-26 ENCOUNTER — Other Ambulatory Visit: Payer: Self-pay | Admitting: Cardiovascular Disease

## 2013-05-27 NOTE — Telephone Encounter (Signed)
Rx was sent to pharmacy electronically. 

## 2013-06-21 ENCOUNTER — Telehealth: Payer: Self-pay | Admitting: Internal Medicine

## 2013-06-21 ENCOUNTER — Other Ambulatory Visit: Payer: Self-pay

## 2013-06-21 MED ORDER — INSULIN GLARGINE 100 UNIT/ML SOLOSTAR PEN: 20.0000 [IU] | PEN_INJECTOR | SUBCUTANEOUS | Status: DC

## 2013-06-21 NOTE — Telephone Encounter (Signed)
Pt.notified

## 2013-06-21 NOTE — Telephone Encounter (Signed)
done

## 2013-06-21 NOTE — Telephone Encounter (Signed)
Pt needs a refill on lantus solorstar  25units daily and then pt also needs a refill on needles for the lantus solorstar. [facility] send in [facility] [address]. Please call pt @ [phone] ext [phone] when meds sent in

## 2013-06-25 ENCOUNTER — Telehealth: Payer: Self-pay | Admitting: Internal Medicine

## 2013-06-25 MED ORDER — NEEDLES & SYRINGES MISC: Status: DC

## 2013-06-25 NOTE — Telephone Encounter (Signed)
Pt called stating that her needles for her lantus did not get sent in last week and called to get the needles sent in.

## 2013-06-27 ENCOUNTER — Telehealth: Payer: Self-pay | Admitting: Cardiovascular Disease

## 2013-06-27 NOTE — Telephone Encounter (Signed)
Stating that she received a message from you and is not sure why  Please call [phone] extension [phone] will be at her desk for the next 25 min.Marland Kitchen Please

## 2013-06-27 NOTE — Telephone Encounter (Signed)
Did not call patient, not sure who did or why.  Left message as such on her VM

## 2013-06-27 NOTE — Telephone Encounter (Signed)
Returning your call from yesterday. She will be in the office for a hour,after that she will not be back until 3:00.

## 2013-06-29 ENCOUNTER — Other Ambulatory Visit: Payer: Self-pay | Admitting: Cardiovascular Disease

## 2013-07-01 NOTE — Telephone Encounter (Signed)
Rx was sent to pharmacy electronically. 

## 2013-07-02 ENCOUNTER — Telehealth: Payer: Self-pay | Admitting: Internal Medicine

## 2013-07-02 MED ORDER — NEEDLES & SYRINGES MISC: Status: DC

## 2013-07-02 NOTE — Telephone Encounter (Signed)
done

## 2013-07-04 ENCOUNTER — Ambulatory Visit (INDEPENDENT_AMBULATORY_CARE_PROVIDER_SITE_OTHER): Payer: BC Managed Care – PPO | Admitting: Cardiovascular Disease

## 2013-07-04 ENCOUNTER — Encounter: Payer: Self-pay | Admitting: Cardiovascular Disease

## 2013-07-04 VITALS — BP 138/88 | HR 80 | Resp 16 | Ht 65.0 in | Wt 219.6 lb

## 2013-07-04 DIAGNOSIS — E782 Mixed hyperlipidemia: Secondary | ICD-10-CM

## 2013-07-04 DIAGNOSIS — E119 Type 2 diabetes mellitus without complications: Secondary | ICD-10-CM

## 2013-07-04 DIAGNOSIS — I2510 Atherosclerotic heart disease of native coronary artery without angina pectoris: Secondary | ICD-10-CM

## 2013-07-04 DIAGNOSIS — Z79899 Other long term (current) drug therapy: Secondary | ICD-10-CM

## 2013-07-04 MED ORDER — EZETIMIBE 10 MG PO TABS: 10.0000 mg | ORAL_TABLET | Freq: Every day | ORAL | Status: DC

## 2013-07-04 NOTE — Patient Instructions (Signed)
Your physician recommends that you return for lab work in: [place] at your convenience at [facility].  Your physician recommends that you schedule a follow-up appointment in: 6 months.

## 2013-07-05 ENCOUNTER — Ambulatory Visit: Payer: BC Managed Care – PPO | Admitting: Cardiovascular Disease

## 2013-07-09 NOTE — Progress Notes (Signed)
Patient ID: Haley George, female   DOB: April 17, 1956, 58 y.o.   MRN: [id number]     Reason for office visit CAD  Haley George returns for followup of multiple coronary risk factors and well established coronary artery disease She has had previous percutaneous revascularization procedures performed in all 3 major coronary arteries and has had problems with restenosis at the edges of previously placed stents requiring repeat PCI. The last interventions were performed in 2010 when she had overlapping stents placed at the level of old or stents in the mid LAD artery and in the left circumflex coronary artery. She used to have reasonably good diabetes control but recently glycemic control has worsened, associated with worsening triglyceride levels and worsening LDL cholesterol levels.  She reports that she has made substantial progress with glycemic control and expects that her upcoming hemoglobin A1c will show improvement. She is working hard with Dr. Redmond School to achieve this. Unfortunate she has had a right knee injury which prevents her from exercising. She thinks that her diet has improved substantially.  She has not had any angina pectoris since her last appointment. She seems to have had substantial improvement with Ranexa. A repeat nuclear stress test performed in November 2014 was normal.  Allergies  Allergen Reactions  . Penicillins Hives  . Statins     Current Outpatient Prescriptions  Medication Sig Dispense Refill  . aspirin 81 MG tablet Take 81 mg by mouth daily.      Marland Kitchen co-enzyme Q-10 50 MG capsule Take 50 mg by mouth daily.        Marland Kitchen ezetimibe (ZETIA) 10 MG tablet Take 1 tablet (10 mg total) by mouth daily.  42 tablet  0  . fenofibrate (TRICOR) 145 MG tablet TAKE 1 TABLET EVERY DAY  30 tablet  7  . fish oil-omega-3 fatty acids 1000 MG capsule Take 2 g by mouth 2 (two) times daily.       . Insulin Glargine (LANTUS) 100 UNIT/ML Solostar Pen Inject 20 Units into the skin 1 day or 1 dose.  3 mL   1  . isosorbide mononitrate (IMDUR) 30 MG 24 hr tablet Take 1 tablet (30 mg total) by mouth daily.  30 tablet  8  . KRILL OIL 1000 MG CAPS Take by mouth.      . metoprolol succinate (TOPROL-XL) 25 MG 24 hr tablet Take 1.5 tablets (37.5 mg total) by mouth daily.  45 tablet  6  . Multiple Vitamin (MULTIVITAMIN WITH MINERALS) TABS Take 1 tablet by mouth daily.      . Needles & Syringes MISC Use pen needle for the lantus solostar  100 each  0  . nitroGLYCERIN (NITROSTAT) 0.4 MG SL tablet Place 0.4 mg under the tongue every 5 (five) minutes as needed for chest pain.      . pioglitazone-metformin (ACTOPLUS MET) 15-850 MG per tablet Take 1 tablet by mouth 2 (two) times daily with a meal.  60 tablet  5  . RANEXA 1000 MG SR tablet TAKE ONE TABLET BY MOUTH TWICE DAILY  60 tablet  9  . rosuvastatin (CRESTOR) 20 MG tablet Take 20 mg by mouth daily.        . valsartan (DIOVAN) 160 MG tablet Take 80 mg by mouth daily.      . vitamin C (ASCORBIC ACID) 500 MG tablet Take 500 mg by mouth daily.        No current facility-administered medications for this visit.    Past Medical History  Diagnosis Date  . Hypertension   . Asthma   . Liver failure     h/o autoimmune hepatitis   . Hyperlipidemia   . Kidney stones   . Psoriasis   . Depression   . Coronary artery disease   . Diabetes mellitus without complication     type 2  . Obesity   . History of nuclear stress test 05/21/2010    dipyridamole; normal perfusion, preserved LV systolic EF of 73%    Past Surgical History  Procedure Laterality Date  . Ureteral stents    . Coronary angioplasty with stent placement  09/2008    2.25x40m Cypher DES to in-stent restenosis of prox LAD; Mini-Vision stent x2 2.0x144mto area distal of initial LAD stent; 3 2.5x1572maxus stents prox to Cypher stent in circumflex  . Coronary angioplasty with stent placement  09/2005    Cypher DES 3.0x22m54m distal RCA; 2.25x12mm60mi0Vision stent to mid LAD; 2.5x18mm 2mer  stent to circumflex  . Transthoracic echocardiogram  04/2010    EF=>55%, borderline conc LVH; trace MR & TR; AV mildly sclerotic; aortic root sclerosis/calcif  . Carotid doppler  10/2008    R & L ICAs 0-49% diameter reduction     No family history on file.  History   Social History  . Marital Status: Single    Spouse Name: N/A    Number of Children: 1  . Years of Education: N/A   Occupational History  .     Social History Main Topics  . Smoking status: Never Smoker   . Smokeless tobacco: Never Used  . Alcohol Use: No  . Drug Use: No  . Sexual Activity: Not on file   Other Topics Concern  . Not on file   Social History Narrative  . No narrative on file    Review of systems: The patient specifically denies any chest pain at rest or with exertion, dyspnea at rest or with exertion, orthopnea, paroxysmal nocturnal dyspnea, syncope, palpitations, focal neurological deficits, intermittent claudication, lower extremity edema, unexplained weight gain, cough, hemoptysis or wheezing.  The patient also denies abdominal pain, nausea, vomiting, dysphagia, diarrhea, constipation, polyuria, polydipsia, dysuria, hematuria, frequency, urgency, abnormal bleeding or bruising, fever, chills, unexpected weight changes, mood swings, change in skin or hair texture, change in voice quality, auditory or visual problems, allergic reactions or rashes, new musculoskeletal complaints other than usual "aches and pains".   PHYSICAL EXAM BP 138/88  Pulse 80  Resp 16  Ht _0  (1.651 m)  Wt 99.61 kg (219 lb 9.6 oz)  BMI 36.54 kg/m2  General: Alert, oriented x3, no distress Head: no evidence of trauma, PERRL, EOMI, no exophtalmos or lid lag, no myxedema, no xanthelasma; normal ears, nose and oropharynx Neck: normal jugular venous pulsations and no hepatojugular reflux; brisk carotid pulses without delay and no carotid bruits Chest: clear to auscultation, no signs of consolidation by percussion or  palpation, normal fremitus, symmetrical and full respiratory excursions Cardiovascular: normal position and quality of the apical impulse, regular rhythm, normal first and second heart sounds, no murmurs, rubs or gallops Abdomen: no tenderness or distention, no masses by palpation, no abnormal pulsatility or arterial bruits, normal bowel sounds, no hepatosplenomegaly Extremities: no clubbing, cyanosis or edema; 2+ radial, ulnar and brachial pulses bilaterally; 2+ right femoral, posterior tibial and dorsalis pedis pulses; 2+ left femoral, posterior tibial and dorsalis pedis pulses; no subclavian or femoral bruits Neurological: grossly nonfocal   EKG: Normal sinus rhythm  Lipid Panel  Component Value Date/Time   CHOL 142 04/22/2013 1018   TRIG 697* 04/22/2013 1018   HDL 21* 04/22/2013 1018   CHOLHDL 6.8 04/22/2013 1018   VLDL NOT CALC 04/22/2013 1018   LDLCALC Comment:   Not calculated due to Triglyceride >400. Suggest ordering Direct LDL (Unit Code: [phone]).   Total Cholesterol/HDL Ratio:CHD Risk                        Coronary Heart Disease Risk Table                                        Men       Women          1/2 Average Risk              3.4        3.3              Average Risk              5.0        4.4           2X Average Risk              9.6        7.1           3X Average Risk             23.4       11.0 Use the calculated Patient Ratio above and the CHD Risk table  to determine the patient's CHD Risk. ATP III Classification (LDL):       < 100        mg/dL         Optimal      100 - 129     mg/dL         Near or Above Optimal      130 - 159     mg/dL         Borderline High      160 - 189     mg/dL         High       > 190        mg/dL         Very High   04/22/2013 1018    BMET    Component Value Date/Time   NA 140 04/22/2013 1017   K 4.7 04/22/2013 1017   CL 100 04/22/2013 1017   CO2 29 04/22/2013 1017   GLUCOSE 256* 04/22/2013 1017   BUN 15 04/22/2013 1017   CREATININE  0.83 04/22/2013 1017   CREATININE 1.30* 01/03/2012 0604   CALCIUM 9.6 04/22/2013 1017   GFRNONAA 49* 09/21/2010 1330   GFRAA  Value: 59        The eGFR has been calculated using the MDRD equation. This calculation has not been validated in all clinical situations. eGFR's persistently <60 mL/min signify possible Chronic Kidney Disease.* 09/21/2010 1330     ASSESSMENT AND PLAN  Truly hope that Mrs. Louthan's prediction of improved glycemic control hold true. She has a high risk profile with poorly controlled glucose levels, severe hypertriglyceridemia and a very HDL cholesterol, all making her a prime candidate for progressive CAD. Will order followup labs that we will share with her primary care provider. At this point in time does  not appear to be any need to change the medications for blood pressure and angina control. I emphasized the critical importance of regular physical exercise and weight loss, whether or not her glycemic control is better than in the past.  Orders Placed This Encounter  Procedures  . Lipid panel  . Hemoglobin A1c  . Comprehensive metabolic panel  . EKG 12-Lead   Meds ordered this encounter  Medications  . ezetimibe (ZETIA) 10 MG tablet    Sig: Take 1 tablet (10 mg total) by mouth daily.    Dispense:  42 tablet    Refill:  0    LOT # [id number] EXP: 09/2015    Holli Humbles, MD, [facility] [phone] office [phone] pager

## 2013-07-21 ENCOUNTER — Other Ambulatory Visit: Payer: Self-pay | Admitting: Family Medicine

## 2013-07-24 LAB — HM DIABETES EYE EXAM

## 2013-07-25 ENCOUNTER — Encounter: Payer: Self-pay | Admitting: Internal Medicine

## 2013-08-12 ENCOUNTER — Other Ambulatory Visit: Payer: Self-pay | Admitting: *Deleted

## 2013-08-12 MED ORDER — VALSARTAN 160 MG PO TABS: 80.0000 mg | ORAL_TABLET | Freq: Every day | ORAL | Status: DC

## 2013-08-17 ENCOUNTER — Other Ambulatory Visit: Payer: Self-pay | Admitting: Cardiovascular Disease

## 2013-08-19 NOTE — Telephone Encounter (Signed)
Rx refill sent to patient pharmacy   

## 2013-10-15 ENCOUNTER — Other Ambulatory Visit: Payer: Self-pay | Admitting: Cardiovascular Disease

## 2013-10-16 NOTE — Telephone Encounter (Signed)
Rx was sent to pharmacy electronically. 

## 2013-10-18 ENCOUNTER — Telehealth: Payer: Self-pay | Admitting: Cardiovascular Disease

## 2013-10-18 ENCOUNTER — Other Ambulatory Visit: Payer: Self-pay | Admitting: Cardiovascular Disease

## 2013-10-18 MED ORDER — ISOSORBIDE MONONITRATE ER 30 MG PO TB24: 30.0000 mg | ORAL_TABLET | Freq: Every day | ORAL | Status: DC

## 2013-10-18 MED ORDER — VALSARTAN 160 MG PO TABS: ORAL_TABLET | ORAL | Status: DC

## 2013-10-18 NOTE — Telephone Encounter (Signed)
Needs to speak with someone regarding an incorrect prescription that was phoned to her pharmacy.  If you cannot reach her on her cell phone [phone]---please call the home # [phone].

## 2013-10-18 NOTE — Telephone Encounter (Signed)
Rx refill sent to patient pharmacy   

## 2013-10-18 NOTE — Telephone Encounter (Signed)
Patient request a new prescription  For valsartan and isosorbide Patient states she and Dr Sallyanne Kuster discuss how to atke her  E -scribed new prescriptions

## 2013-11-09 ENCOUNTER — Other Ambulatory Visit: Payer: Self-pay | Admitting: Family Medicine

## 2013-11-12 ENCOUNTER — Other Ambulatory Visit: Payer: Self-pay | Admitting: Family Medicine

## 2014-01-03 ENCOUNTER — Other Ambulatory Visit: Payer: Self-pay | Admitting: Cardiovascular Disease

## 2014-01-03 LAB — COMPREHENSIVE METABOLIC PANEL
ALK PHOS: 34 U/L — AB (ref 39–117)
ALT: 26 U/L (ref 0–35)
AST: 32 U/L (ref 0–37)
Albumin: 4.2 g/dL (ref 3.5–5.2)
BUN: 16 mg/dL (ref 6–23)
CO2: 28 mEq/L (ref 19–32)
CREATININE: 0.98 mg/dL (ref 0.50–1.10)
Calcium: 9.4 mg/dL (ref 8.4–10.5)
Chloride: 101 mEq/L (ref 96–112)
Glucose, Bld: 130 mg/dL — ABNORMAL HIGH (ref 70–99)
POTASSIUM: 4.1 meq/L (ref 3.5–5.3)
Sodium: 137 mEq/L (ref 135–145)
Total Bilirubin: 0.4 mg/dL (ref 0.2–1.2)
Total Protein: 7.3 g/dL (ref 6.0–8.3)

## 2014-01-03 LAB — LIPID PANEL
CHOL/HDL RATIO: 4 ratio
Cholesterol: 129 mg/dL (ref 0–200)
HDL: 32 mg/dL — AB (ref >39)
LDL CALC: 56 mg/dL (ref 0–99)
Triglycerides: 203 mg/dL — ABNORMAL HIGH (ref <150)
VLDL: 41 mg/dL — ABNORMAL HIGH (ref 0–40)

## 2014-01-03 LAB — HM MAMMOGRAPHY

## 2014-01-03 LAB — HEMOGLOBIN A1C
HEMOGLOBIN A1C: 7.1 % — AB (ref <5.7)
MEAN PLASMA GLUCOSE: 157 mg/dL — AB (ref <117)

## 2014-01-06 ENCOUNTER — Encounter: Payer: Self-pay | Admitting: Cardiovascular Disease

## 2014-01-08 ENCOUNTER — Ambulatory Visit (INDEPENDENT_AMBULATORY_CARE_PROVIDER_SITE_OTHER): Payer: BC Managed Care – PPO | Admitting: Family Medicine

## 2014-01-08 ENCOUNTER — Ambulatory Visit: Payer: Self-pay | Admitting: Family Medicine

## 2014-01-08 ENCOUNTER — Telehealth: Payer: Self-pay | Admitting: Family Medicine

## 2014-01-08 ENCOUNTER — Encounter: Payer: Self-pay | Admitting: Family Medicine

## 2014-01-08 VITALS — Wt 220.0 lb

## 2014-01-08 DIAGNOSIS — N390 Urinary tract infection, site not specified: Secondary | ICD-10-CM

## 2014-01-08 DIAGNOSIS — R350 Frequency of micturition: Secondary | ICD-10-CM

## 2014-01-08 LAB — POCT URINALYSIS DIPSTICK
Bilirubin, UA: NEGATIVE
Glucose, UA: NEGATIVE
Ketones, UA: NEGATIVE
NITRITE UA: POSITIVE
Protein, UA: 30
SPEC GRAV UA: 1.02
UROBILINOGEN UA: NEGATIVE
pH, UA: 5

## 2014-01-08 MED ORDER — CIPROFLOXACIN HCL 500 MG PO TABS: 500.0000 mg | ORAL_TABLET | Freq: Two times a day (BID) | ORAL | Status: DC

## 2014-01-08 NOTE — Patient Instructions (Addendum)
Take all the medication and if there is any questions call me Try Azo-Standard to help with your bladder symptoms.

## 2014-01-08 NOTE — Progress Notes (Signed)
   Subjective:    Patient ID: Haley George, female    DOB: [birth year], 58 y.o.   MRN: [id number]  HPI She complains of a several day history of difficulty with urinary to see and dysuria. No fever, chills and only slight lower abdominal discomfort.   Review of Systems     Objective:   Physical Exam Alert and in no distress. Urine dipstick is positive for nitrites       Assessment & Plan:  Frequent urination - Plan: POCT Urinalysis Dipstick, ciprofloxacin (CIPRO) 500 MG tablet  UTI (lower urinary tract infection) - Plan: ciprofloxacin (CIPRO) 500 MG tablet  she will return here in a week for recheck on her diabetes.

## 2014-01-08 NOTE — Telephone Encounter (Signed)
Pt came in for appt today.  

## 2014-01-13 ENCOUNTER — Ambulatory Visit (INDEPENDENT_AMBULATORY_CARE_PROVIDER_SITE_OTHER): Payer: BC Managed Care – PPO | Admitting: Family Medicine

## 2014-01-13 ENCOUNTER — Encounter: Payer: Self-pay | Admitting: Family Medicine

## 2014-01-13 DIAGNOSIS — I10 Essential (primary) hypertension: Secondary | ICD-10-CM

## 2014-01-13 DIAGNOSIS — E1159 Type 2 diabetes mellitus with other circulatory complications: Secondary | ICD-10-CM

## 2014-01-13 DIAGNOSIS — E1169 Type 2 diabetes mellitus with other specified complication: Secondary | ICD-10-CM

## 2014-01-13 DIAGNOSIS — Z23 Encounter for immunization: Secondary | ICD-10-CM

## 2014-01-13 DIAGNOSIS — E119 Type 2 diabetes mellitus without complications: Secondary | ICD-10-CM

## 2014-01-13 DIAGNOSIS — E669 Obesity, unspecified: Secondary | ICD-10-CM

## 2014-01-13 DIAGNOSIS — E785 Hyperlipidemia, unspecified: Secondary | ICD-10-CM

## 2014-01-13 MED ORDER — EZETIMIBE 10 MG PO TABS: 10.0000 mg | ORAL_TABLET | Freq: Every day | ORAL | Status: DC

## 2014-01-13 MED ORDER — FENOFIBRATE 145 MG PO TABS: ORAL_TABLET | ORAL | Status: DC

## 2014-01-13 MED ORDER — PIOGLITAZONE HCL-METFORMIN HCL 15-850 MG PO TABS: ORAL_TABLET | ORAL | Status: DC

## 2014-01-13 NOTE — Patient Instructions (Signed)
Take the Prilosec daily and see if that affects his tightness

## 2014-01-13 NOTE — Progress Notes (Signed)
   Subjective:    Patient ID: Haley George, female    DOB: Jun 08, 1955, 58 y.o.   MRN: [id number]  HPI She is here for followup on her diabetes. She does be several of her medications renewed. Her eating habits and activity habits are unchanged. She states that work interferes with her ability to eat and exercise properly. She does plan to retire at the end of the year. She does check her feet regularly and will have an eye exam in January. She does complain of a tight pressure type feeling in the mid chest area but this does not occur with physical activity. She can also not relate this to any particular foods. There is no associated shortness of breath, diaphoresis although she does state she feels somewhat weak after this. She has been using Prilosec every other day since she left the hospital. He was placed on it while in the hospital and has not stopped.  Review of Systems     Objective:   Physical Exam Alert and in no distress otherwise not examined previous hemoglobin A1c is 7.1       Assessment & Plan:  Need for prophylactic vaccination and inoculation against influenza - Plan: Flu Vaccine QUAD 36+ mos IM  Obesity (BMI 30-39.9)  Hypertension associated with diabetes  Hyperlipidemia LDL goal <70  Type 2 diabetes mellitus without complication  recommend she try Prilosec on a regular basis to see if it helps. If this doesn't help, I will have her evaluated by cardiology. Also strongly encouraged her to make the changes necessary after she retires to get her diet and exercise pattern set up.

## 2014-01-14 ENCOUNTER — Encounter: Payer: Self-pay | Admitting: Family Medicine

## 2014-01-15 ENCOUNTER — Encounter: Payer: Self-pay | Admitting: Internal Medicine

## 2014-01-18 ENCOUNTER — Other Ambulatory Visit: Payer: Self-pay | Admitting: Cardiovascular Disease

## 2014-01-20 NOTE — Telephone Encounter (Signed)
Rx was sent to pharmacy electronically. 

## 2014-02-13 ENCOUNTER — Other Ambulatory Visit: Payer: Self-pay | Admitting: Family Medicine

## 2014-02-17 ENCOUNTER — Telehealth: Payer: Self-pay | Admitting: Family Medicine

## 2014-02-17 MED ORDER — INSULIN GLARGINE 100 UNIT/ML SOLOSTAR PEN: PEN_INJECTOR | SUBCUTANEOUS | Status: DC

## 2014-02-17 NOTE — Telephone Encounter (Signed)
Pt states got wrong dosage for Solostar pen, should be 25 units q day not 20 units so # of pens is incorrect also.  Please send in corrected Rx today

## 2014-02-17 NOTE — Telephone Encounter (Signed)
Called the pharmacy and spoke with a pharmacist and he said for 30 days it would be 500/25 unit and that's how many ml you need for the month. Sent in 20 ml for the month

## 2014-02-20 ENCOUNTER — Ambulatory Visit (INDEPENDENT_AMBULATORY_CARE_PROVIDER_SITE_OTHER): Payer: BC Managed Care – PPO | Admitting: Cardiovascular Disease

## 2014-02-20 ENCOUNTER — Encounter: Payer: Self-pay | Admitting: Cardiovascular Disease

## 2014-02-20 VITALS — BP 144/86 | HR 75 | Resp 16 | Ht 64.0 in | Wt 220.3 lb

## 2014-02-20 DIAGNOSIS — I10 Essential (primary) hypertension: Secondary | ICD-10-CM

## 2014-02-20 DIAGNOSIS — E1159 Type 2 diabetes mellitus with other circulatory complications: Secondary | ICD-10-CM

## 2014-02-20 DIAGNOSIS — E1169 Type 2 diabetes mellitus with other specified complication: Secondary | ICD-10-CM

## 2014-02-20 DIAGNOSIS — E785 Hyperlipidemia, unspecified: Secondary | ICD-10-CM

## 2014-02-20 DIAGNOSIS — E669 Obesity, unspecified: Secondary | ICD-10-CM

## 2014-02-20 DIAGNOSIS — I25118 Atherosclerotic heart disease of native coronary artery with other forms of angina pectoris: Secondary | ICD-10-CM

## 2014-02-20 DIAGNOSIS — I2510 Atherosclerotic heart disease of native coronary artery without angina pectoris: Secondary | ICD-10-CM

## 2014-02-20 NOTE — Assessment & Plan Note (Addendum)
Well-controlled angina pectoris, probably CCS class II. No revascularization necessary in the last 5 years. Risk factors are finally moving in the right direction.

## 2014-02-20 NOTE — Assessment & Plan Note (Signed)
She has shown substantial improvement in all lipid parameters. I'm most impressed by the improvement in HDL cholesterol which has increased roughly 50%. This can be credited to improve glycemic control. Her triglycerides are almost at target. Her LDL is excellent. We'll wait until her next lipid profile in about 6 months and consider stopping the Zetia if her LDL remains less than 70. She should continue to focus on weight loss which will help her reduce insulin requirements and further improve her insulin resistance and unfavorable lipid profile.

## 2014-02-20 NOTE — Progress Notes (Signed)
Patient ID: Haley George, female   DOB: 09-12-1955, 58 y.o.   MRN: [id number]      Reason for office visit CAD, mixed hyperlipidemia  Arkie returns for followup of multiple coronary risk factors and well established coronary artery disease   [name] has no cardiac complaints and has had substantial improvement in her risk factors. Her hemoglobin A1c is down to 7.1%. Total cholesterol is 129, triglycerides 203, HDL 32, LDL 56. Her weight is however unchanged at 220 pounds, BMI almost 38. She is planning to retire January 1.  She has had previous percutaneous revascularization procedures performed in all 3 major coronary arteries and has had problems with restenosis at the edges of previously placed stents requiring repeat PCI. The last interventions were performed in 2010 when she had overlapping stents placed at the level of older stents in the mid LAD artery and in the left circumflex coronary artery. She has insulin-requiring type 2 diabetes mellitus and secondary mixed hypertriglyceridemia and hypercholesterolemia. She has not had any angina pectoris since her last appointment. She seems to have had substantial improvement with Ranexa. Over the summer she had some atypical chest discomfort that improved with omeprazole daily. Her most recent nuclear stress test performed in November 2014 was normal.  2007 mid LAD 2.2512 mini vision, left circumflex 2.518 Cypher, RCA 3.023 Cypher 2010 mid LAD 2.2512 Taxus overlapping 2.012 mini vision, overlapping another 2.012 mini vision, left circumflex 2.516 Taxus upstream of previous stent  Allergies  Allergen Reactions  . Penicillins Hives  . Statins     Current Outpatient Prescriptions  Medication Sig Dispense Refill  . aspirin 81 MG tablet Take 81 mg by mouth daily.      . B-D ULTRAFINE III SHORT PEN 31G X 8 MM MISC USE AS DIRECTED  100 each  0  . co-enzyme Q-10 50 MG capsule Take 50 mg by mouth daily.        . CRESTOR 20 MG tablet  TAKE ONE TABLET BY MOUTH AT BEDTIME  30 tablet  6  . ezetimibe (ZETIA) 10 MG tablet Take 1 tablet (10 mg total) by mouth daily.  90 tablet  3  . fenofibrate (TRICOR) 145 MG tablet TAKE 1 TABLET EVERY DAY  90 tablet  3  . fish oil-omega-3 fatty acids 1000 MG capsule Take 2 g by mouth 2 (two) times daily.       . Insulin Glargine (LANTUS SOLOSTAR) 100 UNIT/ML Solostar Pen INJECT 25 UNITS INTO THE SKIN EVERY DAY  20 mL  1  . isosorbide mononitrate (IMDUR) 30 MG 24 hr tablet Take 1 tablet (30 mg total) by mouth daily.  30 tablet  11  . KRILL OIL 1000 MG CAPS Take by mouth.      . metoprolol succinate (TOPROL-XL) 25 MG 24 hr tablet Take 1.5 tablets (37.5 mg total) by mouth daily.  45 tablet  6  . Multiple Vitamin (MULTIVITAMIN WITH MINERALS) TABS Take 1 tablet by mouth daily.      Marland Kitchen NITROSTAT 0.4 MG SL tablet DISSOLVE 1 TAB UNDER THE TONGUE EVERY 5 MIN AS NEEDED FOR CHEST PAIN (MAX OF 3)  25 tablet  5  . pioglitazone-metformin (ACTOPLUS MET) 15-850 MG per tablet TAKE 1 TABLET BY MOUTH 2 (TWO) TIMES DAILY WITH A MEAL.  60 tablet  5  . RANEXA 1000 MG SR tablet TAKE ONE TABLET BY MOUTH TWICE DAILY  60 tablet  9  . valsartan (DIOVAN) 160 MG tablet Take once a day  or as directed  30 tablet  11  . vitamin C (ASCORBIC ACID) 500 MG tablet Take 500 mg by mouth daily.        No current facility-administered medications for this visit.    Past Medical History  Diagnosis Date  . Hypertension   . Asthma   . Liver failure     h/o autoimmune hepatitis   . Hyperlipidemia   . Kidney stones   . Psoriasis   . Depression   . Coronary artery disease   . Diabetes mellitus without complication     type 2  . Obesity   . History of nuclear stress test 05/21/2010    dipyridamole; normal perfusion, preserved LV systolic EF of 95%    Past Surgical History  Procedure Laterality Date  . Ureteral stents    . Coronary angioplasty with stent placement  09/2008    2.25x1m Cypher DES to in-stent restenosis of prox  LAD; Mini-Vision stent x2 2.0x124mto area distal of initial LAD stent; 3 2.5x1557maxus stents prox to Cypher stent in circumflex  . Coronary angioplasty with stent placement  09/2005    Cypher DES 3.0x22m61m distal RCA; 2.25x12mm81mi0Vision stent to mid LAD; 2.5x18mm 68mer stent to circumflex  . Transthoracic echocardiogram  04/2010    EF=>55%, borderline conc LVH; trace MR & TR; AV mildly sclerotic; aortic root sclerosis/calcif  . Carotid doppler  10/2008    R & L ICAs 0-49% diameter reduction     No family history on file.  History   Social History  . Marital Status: Single    Spouse Name: N/A    Number of Children: 1  . Years of Education: N/A   Occupational History  .     Social History Main Topics  . Smoking status: Never Smoker   . Smokeless tobacco: Never Used  . Alcohol Use: No  . Drug Use: No  . Sexual Activity: Not on file   Other Topics Concern  . Not on file   Social History Narrative  . No narrative on file    Review of systems: The patient specifically denies any chest pain at rest or with exertion, dyspnea at rest or with exertion, orthopnea, paroxysmal nocturnal dyspnea, syncope, palpitations, focal neurological deficits, intermittent claudication, lower extremity edema, unexplained weight gain, cough, hemoptysis or wheezing.  The patient also denies abdominal pain, nausea, vomiting, dysphagia, diarrhea, constipation, polyuria, polydipsia, dysuria, hematuria, frequency, urgency, abnormal bleeding or bruising, fever, chills, unexpected weight changes, mood swings, change in skin or hair texture, change in voice quality, auditory or visual problems, allergic reactions or rashes, new musculoskeletal complaints other than usual "aches and pains".   PHYSICAL EXAM BP 144/86  Pulse 75  Resp 16  Ht '5\' 4"'  (1.626 m)  Wt 220 lb 4.8 oz (99.927 kg)  BMI 37.80 kg/m2 Recheck BP 135/82 mmHg General: Alert, oriented x3, no distress Head: no evidence of trauma,  PERRL, EOMI, no exophtalmos or lid lag, no myxedema, no xanthelasma; normal ears, nose and oropharynx Neck: normal jugular venous pulsations and no hepatojugular reflux; brisk carotid pulses without delay and no carotid bruits Chest: clear to auscultation, no signs of consolidation by percussion or palpation, normal fremitus, symmetrical and full respiratory excursions Cardiovascular: normal position and quality of the apical impulse, regular rhythm, normal first and second heart sounds, no murmurs, rubs or gallops Abdomen: no tenderness or distention, no masses by palpation, no abnormal pulsatility or arterial bruits, normal bowel sounds, no hepatosplenomegaly Extremities: no  clubbing, cyanosis or edema; 2+ radial, ulnar and brachial pulses bilaterally; 2+ right femoral, posterior tibial and dorsalis pedis pulses; 2+ left femoral, posterior tibial and dorsalis pedis pulses; no subclavian or femoral bruits Neurological: grossly nonfocal   EKG: NSR  Lipid Panel     Component Value Date/Time   CHOL 129 01/03/2014 0911   TRIG 203* 01/03/2014 0911   HDL 32* 01/03/2014 0911   CHOLHDL 4.0 01/03/2014 0911   VLDL 41* 01/03/2014 0911   LDLCALC 56 01/03/2014 0911   LDLDIRECT 93 11/15/2012 0829    BMET    Component Value Date/Time   NA 137 01/03/2014 0911   K 4.1 01/03/2014 0911   CL 101 01/03/2014 0911   CO2 28 01/03/2014 0911   GLUCOSE 130* 01/03/2014 0911   BUN 16 01/03/2014 0911   CREATININE 0.98 01/03/2014 0911   CREATININE 1.30* 01/03/2012 0604   CALCIUM 9.4 01/03/2014 0911   GFRNONAA 49* 09/21/2010 1330   GFRAA  Value: 59        The eGFR has been calculated using the MDRD equation. This calculation has not been validated in all clinical situations. eGFR's persistently <60 mL/min signify possible Chronic Kidney Disease.* 09/21/2010 1330     ASSESSMENT AND PLAN CAD (coronary artery disease), native coronary artery Well-controlled angina pectoris, probably CCS class II. No revascularization  necessary in the last 5 years. Risk factors are finally moving in the right direction.  Hyperlipidemia LDL goal <70 She has shown substantial improvement in all lipid parameters. I'm most impressed by the improvement in HDL cholesterol which has increased roughly 50%. This can be credited to improve glycemic control. Her triglycerides are almost at target. Her LDL is excellent. We'll wait until her next lipid profile in about 6 months and consider stopping the Zetia if her LDL remains less than 70. She should continue to focus on weight loss which will help her reduce insulin requirements and further improve her insulin resistance and unfavorable lipid profile.   Holli Humbles, MD, [place] [phone] office [phone] pager

## 2014-02-20 NOTE — Patient Instructions (Signed)
Dr. Croitoru recommends that you schedule a follow-up appointment in: 6 months    

## 2014-03-15 ENCOUNTER — Other Ambulatory Visit: Payer: Self-pay | Admitting: Cardiovascular Disease

## 2014-03-15 DIAGNOSIS — E785 Hyperlipidemia, unspecified: Secondary | ICD-10-CM

## 2014-03-17 MED ORDER — RANOLAZINE ER 1000 MG PO TB12: 1000.0000 mg | ORAL_TABLET | Freq: Two times a day (BID) | ORAL | Status: DC

## 2014-03-17 MED ORDER — EZETIMIBE 10 MG PO TABS: 10.0000 mg | ORAL_TABLET | Freq: Every day | ORAL | Status: DC

## 2014-03-17 MED ORDER — ROSUVASTATIN CALCIUM 20 MG PO TABS: 20.0000 mg | ORAL_TABLET | Freq: Every day | ORAL | Status: DC

## 2014-03-17 NOTE — Telephone Encounter (Signed)
Spoke with patient to make sure she has not had any chest pain, due to Rx refill for NTG being sent in. Patient did not know it had been sent in back in September. Rx was denied for NTG, refills for other meds sent to pharmacy

## 2014-05-06 ENCOUNTER — Other Ambulatory Visit: Payer: Self-pay

## 2014-05-06 ENCOUNTER — Encounter: Payer: Self-pay | Admitting: Family Medicine

## 2014-05-06 ENCOUNTER — Ambulatory Visit (INDEPENDENT_AMBULATORY_CARE_PROVIDER_SITE_OTHER): Payer: BC Managed Care – PPO | Admitting: Family Medicine

## 2014-05-06 VITALS — BP 130/80 | HR 82 | Wt 214.0 lb

## 2014-05-06 DIAGNOSIS — I10 Essential (primary) hypertension: Secondary | ICD-10-CM

## 2014-05-06 DIAGNOSIS — E1169 Type 2 diabetes mellitus with other specified complication: Secondary | ICD-10-CM

## 2014-05-06 DIAGNOSIS — E1159 Type 2 diabetes mellitus with other circulatory complications: Secondary | ICD-10-CM

## 2014-05-06 DIAGNOSIS — E669 Obesity, unspecified: Secondary | ICD-10-CM

## 2014-05-06 DIAGNOSIS — Z91199 Patient's noncompliance with other medical treatment and regimen due to unspecified reason: Secondary | ICD-10-CM

## 2014-05-06 DIAGNOSIS — E119 Type 2 diabetes mellitus without complications: Secondary | ICD-10-CM

## 2014-05-06 DIAGNOSIS — L409 Psoriasis, unspecified: Secondary | ICD-10-CM

## 2014-05-06 DIAGNOSIS — I2510 Atherosclerotic heart disease of native coronary artery without angina pectoris: Secondary | ICD-10-CM

## 2014-05-06 DIAGNOSIS — Z9119 Patient's noncompliance with other medical treatment and regimen: Secondary | ICD-10-CM

## 2014-05-06 LAB — POCT GLYCOSYLATED HEMOGLOBIN (HGB A1C): Hemoglobin A1C: 7.8

## 2014-05-06 LAB — POCT UA - MICROALBUMIN
Albumin/Creatinine Ratio, Urine, POC: 16
Creatinine, POC: 251.4 mg/dL
Microalbumin Ur, POC: 40.3 mg/L

## 2014-05-06 MED ORDER — INSULIN PEN NEEDLE 32G X 4 MM MISC: Status: DC

## 2014-05-06 NOTE — Progress Notes (Signed)
Subjective:    Haley George is a 59 y.o. female who presents for follow-up of Type 2 diabetes mellitus.  She is now retired . She is having difficulty with her psoriasis and does plan to get some light therapy to help with this. She is followed regularly by her cardiologist.  Home blood sugar records: NONE she does not check her sugars regularly.  Current symptoms/problems include NONE Daily foot checks:   Any foot concerns: NONE Last eye exam:  07/24/2013 GROAT   Medication compliance: Variable. She admits to on occasion not taking her insulin dosing. Current diet: NONE Current exercise: NONE she does plan to do water aerobics but has yet to set this up. She has lost several pounds since her retirement. Known diabetic complications: cardiovascular disease Cardiovascular risk factors: advanced age (older than 3 for men, 67 for women), diabetes mellitus, dyslipidemia, hypertension, obesity (BMI >= 30 kg/m2) and sedentary lifestyle   The following portions of the patient's history were reviewed and updated as appropriate: allergies, current medications, past medical history, past social history and problem list.  ROS as in subjective above    Objective:     General appearence: alert, no distress, WD/WN   Lab Review Lab Results  Component Value Date   HGBA1C 7.1* 01/03/2014   Lab Results  Component Value Date   CHOL 129 01/03/2014   HDL 32* 01/03/2014   LDLCALC 56 01/03/2014   LDLDIRECT 93 11/15/2012   TRIG 203* 01/03/2014   CHOLHDL 4.0 01/03/2014   No results found for: Derl Barrow   Chemistry      Component Value Date/Time   NA 137 01/03/2014 0911   K 4.1 01/03/2014 0911   CL 101 01/03/2014 0911   CO2 28 01/03/2014 0911   BUN 16 01/03/2014 0911   CREATININE 0.98 01/03/2014 0911   CREATININE 1.30* 01/03/2012 0604      Component Value Date/Time   CALCIUM 9.4 01/03/2014 0911   ALKPHOS 34* 01/03/2014 0911   AST 32 01/03/2014 0911   ALT 26 01/03/2014  0911   BILITOT 0.4 01/03/2014 0911        Chemistry      Component Value Date/Time   NA 137 01/03/2014 0911   K 4.1 01/03/2014 0911   CL 101 01/03/2014 0911   CO2 28 01/03/2014 0911   BUN 16 01/03/2014 0911   CREATININE 0.98 01/03/2014 0911   CREATININE 1.30* 01/03/2012 0604      Component Value Date/Time   CALCIUM 9.4 01/03/2014 0911   ALKPHOS 34* 01/03/2014 0911   AST 32 01/03/2014 0911   ALT 26 01/03/2014 0911   BILITOT 0.4 01/03/2014 0911     A1c 7.8      Assessment:  Diabetes mellitus without complication - Plan: POCT glycosylated hemoglobin (Hb A1C), POCT UA - Microalbumin  ASHD (arteriosclerotic heart disease)  Type 2 diabetes mellitus without complication  Psoriasis  Hypertension associated with diabetes  Obesity (BMI 30-39.9)  Personal history of noncompliance with medical treatment, presenting hazards to health        Plan:    1.  Rx changes: none 2.  Education: Reviewed 'ABCs' of diabetes management (respective goals in parentheses):  A1C (<7), blood pressure (<130/80), and cholesterol (LDL <100). 3.  Compliance at present is estimated to be poor. Efforts to improve compliance (if necessary) will be directed at increased exercise. 4. Follow up: 4 months   I again stressed the need for her to be more vigilant and taking her insulin  as well as checking her sugars either before meals or 2 hours after meals. Discussed the need for exercise on a regular basis. Recommend 20 minutes of something physical daily even if it split up into 5 minute increments.

## 2014-05-29 ENCOUNTER — Other Ambulatory Visit: Payer: Self-pay | Admitting: Cardiovascular Disease

## 2014-05-29 NOTE — Telephone Encounter (Signed)
Rx(s) sent to pharmacy electronically.  

## 2014-08-25 ENCOUNTER — Other Ambulatory Visit: Payer: Self-pay | Admitting: Family Medicine

## 2014-09-08 ENCOUNTER — Ambulatory Visit: Payer: BC Managed Care – PPO | Admitting: Family Medicine

## 2014-09-23 ENCOUNTER — Other Ambulatory Visit: Payer: Self-pay | Admitting: Family Medicine

## 2014-09-24 ENCOUNTER — Other Ambulatory Visit: Payer: Self-pay | Admitting: Family Medicine

## 2014-09-26 LAB — HM DIABETES EYE EXAM

## 2014-10-02 ENCOUNTER — Ambulatory Visit (INDEPENDENT_AMBULATORY_CARE_PROVIDER_SITE_OTHER): Payer: BC Managed Care – PPO | Admitting: Family Medicine

## 2014-10-02 ENCOUNTER — Encounter: Payer: Self-pay | Admitting: Family Medicine

## 2014-10-02 VITALS — BP 120/70 | HR 70 | Ht 64.0 in | Wt 219.0 lb

## 2014-10-02 DIAGNOSIS — E1169 Type 2 diabetes mellitus with other specified complication: Secondary | ICD-10-CM | POA: Diagnosis not present

## 2014-10-02 DIAGNOSIS — E669 Obesity, unspecified: Secondary | ICD-10-CM | POA: Diagnosis not present

## 2014-10-02 DIAGNOSIS — G479 Sleep disorder, unspecified: Secondary | ICD-10-CM

## 2014-10-02 DIAGNOSIS — E785 Hyperlipidemia, unspecified: Secondary | ICD-10-CM | POA: Diagnosis not present

## 2014-10-02 DIAGNOSIS — L409 Psoriasis, unspecified: Secondary | ICD-10-CM | POA: Diagnosis not present

## 2014-10-02 DIAGNOSIS — I10 Essential (primary) hypertension: Secondary | ICD-10-CM | POA: Diagnosis not present

## 2014-10-02 DIAGNOSIS — Z91199 Patient's noncompliance with other medical treatment and regimen due to unspecified reason: Secondary | ICD-10-CM

## 2014-10-02 DIAGNOSIS — Z9119 Patient's noncompliance with other medical treatment and regimen: Secondary | ICD-10-CM

## 2014-10-02 DIAGNOSIS — E118 Type 2 diabetes mellitus with unspecified complications: Secondary | ICD-10-CM

## 2014-10-02 DIAGNOSIS — E1159 Type 2 diabetes mellitus with other circulatory complications: Secondary | ICD-10-CM

## 2014-10-02 LAB — POCT GLYCOSYLATED HEMOGLOBIN (HGB A1C): Hemoglobin A1C: 7.9

## 2014-10-02 NOTE — Patient Instructions (Addendum)
Insomnia Insomnia is frequent trouble falling and/or staying asleep. Insomnia can be a long term problem or a short term problem. Both are common. Insomnia can be a short term problem when the wakefulness is related to a certain stress or worry. Long term insomnia is often related to ongoing stress during waking hours and/or poor sleeping habits. Overtime, sleep deprivation itself can make the problem worse. Every little thing feels more severe because you are overtired and your ability to cope is decreased. CAUSES   Stress, anxiety, and depression.  Poor sleeping habits.  Distractions such as TV in the bedroom.  Naps close to bedtime.  Engaging in emotionally charged conversations before bed.  Technical reading before sleep.  Alcohol and other sedatives. They may make the problem worse. They can hurt normal sleep patterns and normal dream activity.  Stimulants such as caffeine for several hours prior to bedtime.  Pain syndromes and shortness of breath can cause insomnia.  Exercise late at night.  Changing time zones may cause sleeping problems (jet lag). It is sometimes helpful to have someone observe your sleeping patterns. They should look for periods of not breathing during the night (sleep apnea). They should also look to see how long those periods last. If you live alone or observers are uncertain, you can also be observed at a sleep clinic where your sleep patterns will be professionally monitored. Sleep apnea requires a checkup and treatment. Give your caregivers your medical history. Give your caregivers observations your family has made about your sleep.  SYMPTOMS   Not feeling rested in the morning.  Anxiety and restlessness at bedtime.  Difficulty falling and staying asleep. TREATMENT   Your caregiver may prescribe treatment for an underlying medical disorders. Your caregiver can give advice or help if you are using alcohol or other drugs for self-medication. Treatment  of underlying problems will usually eliminate insomnia problems.  Medications can be prescribed for short time use. They are generally not recommended for lengthy use.  Over-the-counter sleep medicines are not recommended for lengthy use. They can be habit forming.  You can promote easier sleeping by making lifestyle changes such as:  Using relaxation techniques that help with breathing and reduce muscle tension.  Exercising earlier in the day.  Changing your diet and the time of your last meal. No night time snacks.  Establish a regular time to go to bed.  Counseling can help with stressful problems and worry.  Soothing music and white noise may be helpful if there are background noises you cannot remove.  Stop tedious detailed work at least one hour before bedtime. HOME CARE INSTRUCTIONS   Keep a diary. Inform your caregiver about your progress. This includes any medication side effects. See your caregiver regularly. Take note of:  Times when you are asleep.  Times when you are awake during the night.  The quality of your sleep.  How you feel the next day. This information will help your caregiver care for you.  Get out of bed if you are still awake after 15 minutes. Read or do some quiet activity. Keep the lights down. Wait until you feel sleepy and go back to bed.  Keep regular sleeping and waking hours. Avoid naps.  Exercise regularly.  Avoid distractions at bedtime. Distractions include watching television or engaging in any intense or detailed activity like attempting to balance the household checkbook.  Develop a bedtime ritual. Keep a familiar routine of bathing, brushing your teeth, climbing into bed at the same   time each night, listening to soothing music. Routines increase the success of falling to sleep faster.  Use relaxation techniques. This can be using breathing and muscle tension release routines. It can also include visualizing peaceful scenes. You can  also help control troubling or intruding thoughts by keeping your mind occupied with boring or repetitive thoughts like the old concept of counting sheep. You can make it more creative like imagining planting one beautiful flower after another in your backyard garden.  During your day, work to eliminate stress. When this is not possible use some of the previous suggestions to help reduce the anxiety that accompanies stressful situations. MAKE SURE YOU:   Understand these instructions.  Will watch your condition.  Will get help right away if you are not doing well or get worse. Document Released: 04/08/2000 Document Revised: 07/04/2011 Document Reviewed: 05/09/2007 [facility] Patient Information 2015 [place], [place]. This information is not intended to replace advice given to you by your health care provider. Make sure you discuss any questions you have with your health care provider.   Your #1 priority should be to take care of yourself

## 2014-10-02 NOTE — Progress Notes (Signed)
  Subjective:    Patient ID: Haley George, female    DOB: 08/10/1955, 59 y.o.   MRN: [id number]  Haley George is a 59 y.o. female who presents for follow-up of Type 2 diabetes mellitus.She is now retired and admits to not oh is remembering to take her insulin dosing in the morning. She recognizes that she does not have a routine set up yet in her retirement. She sees her dermatologist regularly and there is a question about psoriasis. She does continue to have some right knee pain. She has had an MRI and has seen orthopedics about this. She is not involved in a regular exercise routine.She also has been having difficulty recently with sleep and recognizes this is stress related.  Home blood sugar records: Patient checks B/S BID Current symptoms/problem None Daily foot checks:YES   Any foot concerns: crack on outside of left foot Exercise:reg.housework Eyes:09/26/14 dr.Groat The following portions of the patient's history were reviewed and updated as appropriate: allergies, current medications, past medical history, past social history and problem list.  ROS as in subjective above.     Objective:    Physical Exam Alert and in no distress otherwise not examined. A1C 7.9    Lab Review Diabetic Labs Latest Ref Rng 05/06/2014 01/03/2014 04/22/2013 11/15/2012 01/03/2012  HbA1c - 7.8 7.1(H) 8.7 8.2(H) -  Chol 0 - 200 mg/dL - 129 142 166 -  HDL >39 mg/dL - 32(L) 21(L) 26(L) -  Calc LDL 0 - 99 mg/dL - 56 - - -  Triglycerides <150 mg/dL - 203(H) 697(H) 435(H) -  Creatinine 0.50 - 1.10 mg/dL - 0.98 0.83 1.02 1.30(H)   BP/Weight 05/06/2014 02/20/2014 01/08/2014 07/04/2013 5/97/4163  Systolic BP 845 364 - 680 321  Diastolic BP 80 86 - 88 84  Wt. (Lbs) 214 220.3 220 219.6 216  BMI 36.72 37.8 36.61 36.54 35.94   Foot/eye exam completion dates Latest Ref Rng 07/24/2013  Eye Exam No Retinopathy No Retinopathy  Foot Form Completion - -    Haley George  reports that she has never smoked. She has never used  smokeless tobacco. She reports that she does not drink alcohol or use illicit drugs.      Assessment & Plan:  Diabetes mellitus with complication - Plan: POCT glycosylated hemoglobin (Hb A1C)  Hypertension associated with diabetes  Obesity (BMI 30-39.9)  Personal history of noncompliance with medical treatment, presenting hazards to health  Sleep disturbance  Hyperlipidemia associated with type 2 diabetes mellitus  Psoriasis  Encouraged her to make her health the #1 priority. Also discussed getting involved in a routine that involves taking better care of herself in terms of her diabetes care as well as diet and exercise. Discussed stress and sleep disturbance with her in detail. Sleep hygiene information given. Also discussed mother-daughter relationship and the fact that she is in the process of changing the dynamics between her and her daughter.

## 2014-10-23 ENCOUNTER — Other Ambulatory Visit: Payer: Self-pay | Admitting: Cardiovascular Disease

## 2014-10-23 ENCOUNTER — Other Ambulatory Visit: Payer: Self-pay | Admitting: Family Medicine

## 2014-11-03 ENCOUNTER — Telehealth: Payer: Self-pay | Admitting: Family Medicine

## 2014-11-03 ENCOUNTER — Ambulatory Visit (INDEPENDENT_AMBULATORY_CARE_PROVIDER_SITE_OTHER): Payer: BC Managed Care – PPO | Admitting: Family Medicine

## 2014-11-03 ENCOUNTER — Other Ambulatory Visit: Payer: Self-pay | Admitting: Cardiovascular Disease

## 2014-11-03 VITALS — BP 120/70 | Temp 98.5°F | Wt 218.0 lb

## 2014-11-03 DIAGNOSIS — E1159 Type 2 diabetes mellitus with other circulatory complications: Secondary | ICD-10-CM

## 2014-11-03 DIAGNOSIS — I10 Essential (primary) hypertension: Secondary | ICD-10-CM | POA: Diagnosis not present

## 2014-11-03 DIAGNOSIS — I152 Hypertension secondary to endocrine disorders: Secondary | ICD-10-CM

## 2014-11-03 DIAGNOSIS — N3000 Acute cystitis without hematuria: Secondary | ICD-10-CM

## 2014-11-03 DIAGNOSIS — J069 Acute upper respiratory infection, unspecified: Secondary | ICD-10-CM | POA: Diagnosis not present

## 2014-11-03 DIAGNOSIS — K219 Gastro-esophageal reflux disease without esophagitis: Secondary | ICD-10-CM

## 2014-11-03 DIAGNOSIS — E1169 Type 2 diabetes mellitus with other specified complication: Secondary | ICD-10-CM

## 2014-11-03 LAB — POCT URINALYSIS DIPSTICK
Bilirubin, UA: NEGATIVE
Blood, UA: POSITIVE
GLUCOSE UA: NEGATIVE
Ketones, UA: NEGATIVE
Nitrite, UA: POSITIVE
PH UA: 6
Protein, UA: 0.15

## 2014-11-03 MED ORDER — SULFAMETHOXAZOLE-TRIMETHOPRIM 800-160 MG PO TABS: 1.0000 | ORAL_TABLET | Freq: Two times a day (BID) | ORAL | Status: DC

## 2014-11-03 NOTE — Telephone Encounter (Signed)
Recommend Robitussin-DM during the day and NyQuil at night and if she has been doing that then let me know

## 2014-11-03 NOTE — Progress Notes (Signed)
   Subjective:    Patient ID: Haley George, female    DOB: [birth year], 59 y.o.   MRN: [id number]  HPI She has a one-week history of urinary frequency, urgency, dysuria and some slight right-sided CVA discomfort.She also complains of a 2 day history of sore throat, chest congestion and coughing. She has a history of reflux disease but is only using her Prilosec every 3 or 4 days. She has concerns over renal issues with this medication.   Review of Systems     Objective:   Physical Exam Alert and in no distress. Tympanic membranes and canals are normal. Pharyngeal area is normal. Neck is supple without adenopathy or thyromegaly. Cardiac exam shows a regular sinus rhythm without murmurs or gallops. Lungs are clear to auscultation. Urine dipstick was positive for nitrites and white cells.       Assessment & Plan:  Acute cystitis without hematuria - Plan: sulfamethoxazole-trimethoprim (BACTRIM DS,SEPTRA DS) 800-160 MG per tablet, POCT Urinalysis Dipstick  Hypertension associated with diabetes  Gastroesophageal reflux disease without esophagitis  Acute URI Continue on her present medications. Bactrim will be given. Conservative care for the URI. Encouraged her to continue with her present dosing regimen of Prilosec.

## 2014-11-03 NOTE — Telephone Encounter (Signed)
Pt informed and verbalized understanding

## 2014-11-03 NOTE — Telephone Encounter (Signed)
Please call  Patient    She states she also needs Rx for cough

## 2014-11-17 ENCOUNTER — Other Ambulatory Visit: Payer: Self-pay | Admitting: Cardiovascular Disease

## 2014-11-18 ENCOUNTER — Telehealth: Payer: Self-pay | Admitting: Cardiovascular Disease

## 2014-11-18 NOTE — Telephone Encounter (Signed)
Closed encounter °

## 2014-12-23 LAB — HM DIABETES EYE EXAM

## 2015-02-10 ENCOUNTER — Encounter: Payer: Self-pay | Admitting: Family Medicine

## 2015-02-10 ENCOUNTER — Telehealth: Payer: Self-pay | Admitting: Cardiovascular Disease

## 2015-02-10 ENCOUNTER — Other Ambulatory Visit: Payer: Self-pay

## 2015-02-10 ENCOUNTER — Ambulatory Visit (INDEPENDENT_AMBULATORY_CARE_PROVIDER_SITE_OTHER): Payer: BC Managed Care – PPO | Admitting: Family Medicine

## 2015-02-10 VITALS — BP 130/70 | HR 83 | Ht 64.0 in | Wt 212.0 lb

## 2015-02-10 DIAGNOSIS — E118 Type 2 diabetes mellitus with unspecified complications: Secondary | ICD-10-CM

## 2015-02-10 DIAGNOSIS — E785 Hyperlipidemia, unspecified: Secondary | ICD-10-CM | POA: Diagnosis not present

## 2015-02-10 DIAGNOSIS — E1159 Type 2 diabetes mellitus with other circulatory complications: Secondary | ICD-10-CM | POA: Diagnosis not present

## 2015-02-10 DIAGNOSIS — I152 Hypertension secondary to endocrine disorders: Secondary | ICD-10-CM

## 2015-02-10 DIAGNOSIS — Z23 Encounter for immunization: Secondary | ICD-10-CM

## 2015-02-10 DIAGNOSIS — E1169 Type 2 diabetes mellitus with other specified complication: Secondary | ICD-10-CM | POA: Diagnosis not present

## 2015-02-10 DIAGNOSIS — E669 Obesity, unspecified: Secondary | ICD-10-CM

## 2015-02-10 DIAGNOSIS — I2510 Atherosclerotic heart disease of native coronary artery without angina pectoris: Secondary | ICD-10-CM | POA: Diagnosis not present

## 2015-02-10 DIAGNOSIS — I10 Essential (primary) hypertension: Secondary | ICD-10-CM

## 2015-02-10 LAB — POCT GLYCOSYLATED HEMOGLOBIN (HGB A1C): Hemoglobin A1C: 6.8

## 2015-02-10 MED ORDER — INSULIN GLARGINE 100 UNIT/ML SOLOSTAR PEN: PEN_INJECTOR | SUBCUTANEOUS | Status: DC

## 2015-02-10 NOTE — Telephone Encounter (Signed)
Closed encounter °

## 2015-02-10 NOTE — Progress Notes (Signed)
  Subjective:    Patient ID: Haley George, female    DOB: 12/20/1955, 59 y.o.   MRN: [id number]  Haley George is a 59 y.o. female who presents for follow-up of Type 2 diabetes mellitus.she apparently will need to switch to a different insulin preparation due to insurance.she follows up regularly with her cardiologist.  Home blood sugar records: PATIENT IS TO TEST once a day Current symptoms/problems NONE Daily foot checks:YES   Any foot concerns: NONE Exercise: 20,000 STEPS SAT. FRIDAYS 10,000 STEPS YME:BRAXE 09/26/14 She has been retired since January and doing quite well. The following portions of the patient's history were reviewed and updated as appropriate: allergies, current medications, past medical history, past social history and problem list.  ROS as in subjective above.     Objective:    Physical Exam Alert and in no distress otherwise not examined.  Height 5\' 4"  (1.626 m), weight 212 lb (96.163 kg).  Lab Review Diabetic Labs Latest Ref Rng 10/02/2014 05/06/2014 01/03/2014 04/22/2013 11/15/2012  HbA1c - 7.9 7.8 7.1(H) 8.7 8.2(H)  Chol 0 - 200 mg/dL - - 129 142 166  HDL >39 mg/dL - - 32(L) 21(L) 26(L)  Calc LDL 0 - 99 mg/dL - - 56 - -  Triglycerides <150 mg/dL - - 203(H) 697(H) 435(H)  Creatinine 0.50 - 1.10 mg/dL - - 0.98 0.83 1.02   BP/Weight 02/10/2015 11/03/2014 10/02/2014 05/06/2014 94/10/6806  Systolic BP - 811 031 594 585  Diastolic BP - 70 70 80 86  Wt. (Lbs) 212 218 219 214 220.3  BMI 36.37 37.4 37.57 36.72 37.8   Foot/eye exam completion dates Latest Ref Rng 07/24/2013  Eye Exam No Retinopathy No Retinopathy  Foot Form Completion - -  hemoglobin A1c is 6.8  Haley George  reports that she has never smoked. She has never used smokeless tobacco. She reports that she does not drink alcohol or use illicit drugs.     Assessment & Plan:    Hypertension associated with diabetes ([place])  Obesity (BMI 30-39.9)  Diabetes mellitus with complication ([place]) - Plan: POCT  glycosylated hemoglobin (Hb A1C), Insulin Glargine (LANTUS SOLOSTAR) 100 UNIT/ML Solostar Pen  Hyperlipidemia associated with type 2 diabetes mellitus ([place])  Need for prophylactic vaccination and inoculation against influenza - Plan: Flu Vaccine QUAD 36+ mos PF IM (Fluarix & Fluzone Quad PF)  ASHD (arteriosclerotic heart disease)   1. Rx changes: none 2. Education: Reviewed 'ABCs' of diabetes management (respective goals in parentheses):  A1C (<7), blood pressure (<130/80), and cholesterol (LDL <100). 3. Compliance at present is estimated to be excellent. Efforts to improve compliance (if necessary) will be directed at continuing her present regimen 4. Follow up: 4 months I strongly encouraged her to continue with her present weight loss and lifestyle regimen. She now finally realizes the need for her to take better care of herself and not put everybody else's wants, needs and desires above her own.

## 2015-02-12 ENCOUNTER — Ambulatory Visit: Payer: BC Managed Care – PPO | Admitting: Cardiovascular Disease

## 2015-02-23 ENCOUNTER — Telehealth: Payer: Self-pay | Admitting: Cardiovascular Disease

## 2015-02-23 ENCOUNTER — Other Ambulatory Visit: Payer: Self-pay | Admitting: Cardiovascular Disease

## 2015-02-23 MED ORDER — RANOLAZINE ER 1000 MG PO TB12: 1000.0000 mg | ORAL_TABLET | Freq: Two times a day (BID) | ORAL | Status: DC

## 2015-02-23 NOTE — Telephone Encounter (Signed)
Haley George is needing a new prescription for her Ranexa sent to [facility] in [place] .Marland Kitchen The number is [phone].  Thanks

## 2015-02-23 NOTE — Telephone Encounter (Signed)
Rx(s) requested were sent to preferred pharmacy electronically. 

## 2015-02-23 NOTE — Telephone Encounter (Signed)
°  STAT if patient is at the pharmacy , call can be transferred to refill team.   1. Which medications need to be refilled? Ranexa   2. Which pharmacy/location is medication to be sent to?[facility] in [place]   3. Do they need a 30 day or 90 day supply? She did not say.   * pt is out of the medication and needs if . She is leaving town for a trip today*

## 2015-03-02 ENCOUNTER — Ambulatory Visit (INDEPENDENT_AMBULATORY_CARE_PROVIDER_SITE_OTHER): Payer: BC Managed Care – PPO | Admitting: Cardiovascular Disease

## 2015-03-02 ENCOUNTER — Encounter: Payer: Self-pay | Admitting: Cardiovascular Disease

## 2015-03-02 VITALS — BP 154/86 | HR 87 | Resp 16 | Ht 65.0 in | Wt 212.0 lb

## 2015-03-02 DIAGNOSIS — E1169 Type 2 diabetes mellitus with other specified complication: Secondary | ICD-10-CM

## 2015-03-02 DIAGNOSIS — I209 Angina pectoris, unspecified: Secondary | ICD-10-CM

## 2015-03-02 DIAGNOSIS — E1159 Type 2 diabetes mellitus with other circulatory complications: Secondary | ICD-10-CM | POA: Diagnosis not present

## 2015-03-02 DIAGNOSIS — I25118 Atherosclerotic heart disease of native coronary artery with other forms of angina pectoris: Secondary | ICD-10-CM

## 2015-03-02 DIAGNOSIS — I10 Essential (primary) hypertension: Secondary | ICD-10-CM

## 2015-03-02 DIAGNOSIS — E785 Hyperlipidemia, unspecified: Secondary | ICD-10-CM

## 2015-03-02 DIAGNOSIS — E669 Obesity, unspecified: Secondary | ICD-10-CM

## 2015-03-02 NOTE — Patient Instructions (Signed)
Your physician has requested that you have en exercise stress myoview. For further information please visit [email or web address]. Please follow instruction sheet, as given.  IF THE STRESS TEST IS NORMAL DR. Sallyanne Kuster WANTS YOU TO WEAR A HEART MONITOR.  Dr. Sallyanne Kuster recommends that you schedule a follow-up appointment in: 3-4 MONTHS.
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
## 2015-03-03 ENCOUNTER — Telehealth (HOSPITAL_COMMUNITY): Payer: Self-pay

## 2015-03-03 NOTE — Telephone Encounter (Signed)
Encounter complete. 

## 2015-03-04 ENCOUNTER — Encounter: Payer: Self-pay | Admitting: Cardiovascular Disease

## 2015-03-04 ENCOUNTER — Ambulatory Visit (HOSPITAL_COMMUNITY)
Admission: RE | Admit: 2015-03-04 | Discharge: 2015-03-04 | Disposition: A | Discharge status: Home / Self Care | Payer: BC Managed Care – PPO | Source: Ambulatory Visit | Attending: Cardiovascular Disease | Admitting: Cardiovascular Disease

## 2015-03-04 DIAGNOSIS — Z8249 Family history of ischemic heart disease and other diseases of the circulatory system: Secondary | ICD-10-CM | POA: Insufficient documentation

## 2015-03-04 DIAGNOSIS — I209 Angina pectoris, unspecified: Secondary | ICD-10-CM

## 2015-03-04 DIAGNOSIS — R0609 Other forms of dyspnea: Secondary | ICD-10-CM | POA: Diagnosis not present

## 2015-03-04 DIAGNOSIS — Z6835 Body mass index (BMI) 35.0-35.9, adult: Secondary | ICD-10-CM | POA: Diagnosis not present

## 2015-03-04 DIAGNOSIS — E1159 Type 2 diabetes mellitus with other circulatory complications: Secondary | ICD-10-CM | POA: Diagnosis not present

## 2015-03-04 DIAGNOSIS — E669 Obesity, unspecified: Secondary | ICD-10-CM | POA: Insufficient documentation

## 2015-03-04 DIAGNOSIS — R002 Palpitations: Secondary | ICD-10-CM | POA: Diagnosis not present

## 2015-03-04 DIAGNOSIS — I10 Essential (primary) hypertension: Secondary | ICD-10-CM

## 2015-03-04 LAB — MYOCARDIAL PERFUSION IMAGING
CHL CUP NUCLEAR SSS: 0
CSEPEW: 7.1 METS
CSEPHR: 93 %
CSEPPHR: 153 {beats}/min
Exercise duration (min): 6 min
Exercise duration (sec): 40 s
LVDIAVOL: 79 mL
LVSYSVOL: 35 mL
MPHR: 163 {beats}/min
NUC STRESS TID: 1.25
RPE: 16
Rest HR: 76 {beats}/min
SDS: 0
SRS: 0

## 2015-03-04 MED ORDER — TECHNETIUM TC 99M SESTAMIBI GENERIC - CARDIOLITE
10.9000 | Freq: Once | INTRAVENOUS | Status: AC | PRN
  Administered 2015-03-04: 10.9 via INTRAVENOUS

## 2015-03-04 MED ORDER — TECHNETIUM TC 99M SESTAMIBI GENERIC - CARDIOLITE
30.8000 | Freq: Once | INTRAVENOUS | Status: AC | PRN
  Administered 2015-03-04: 30.8 via INTRAVENOUS

## 2015-03-05 ENCOUNTER — Telehealth: Payer: Self-pay | Admitting: *Deleted

## 2015-03-05 DIAGNOSIS — R531 Weakness: Secondary | ICD-10-CM

## 2015-03-05 DIAGNOSIS — R079 Chest pain, unspecified: Secondary | ICD-10-CM

## 2015-03-05 NOTE — Telephone Encounter (Signed)
LMTC to schedule a 30 day Event Monitor per Dr. Loletha Grayer.  Order placed for [facility] but can be done at either location.

## 2015-03-16 ENCOUNTER — Other Ambulatory Visit: Payer: Self-pay | Admitting: Cardiovascular Disease

## 2015-03-22 ENCOUNTER — Other Ambulatory Visit: Payer: Self-pay | Admitting: Family Medicine

## 2015-03-24 ENCOUNTER — Other Ambulatory Visit: Payer: Self-pay | Admitting: Cardiovascular Disease

## 2015-03-24 NOTE — Telephone Encounter (Signed)
REFILL 

## 2015-03-25 NOTE — Telephone Encounter (Signed)
REFILL 

## 2015-04-02 ENCOUNTER — Other Ambulatory Visit: Payer: Self-pay | Admitting: Family Medicine

## 2015-04-16 ENCOUNTER — Encounter: Payer: Self-pay | Admitting: Family Medicine

## 2015-04-16 ENCOUNTER — Ambulatory Visit (INDEPENDENT_AMBULATORY_CARE_PROVIDER_SITE_OTHER): Payer: BC Managed Care – PPO | Admitting: Family Medicine

## 2015-04-16 VITALS — BP 150/90 | HR 77 | Temp 98.2°F | Wt 209.0 lb

## 2015-04-16 DIAGNOSIS — N3000 Acute cystitis without hematuria: Secondary | ICD-10-CM

## 2015-04-16 LAB — POCT URINALYSIS DIPSTICK
Bilirubin, UA: NEGATIVE
Blood, UA: NEGATIVE
Ketones, UA: NEGATIVE
NITRITE UA: NEGATIVE
Spec Grav, UA: >=1.03
UROBILINOGEN UA: NEGATIVE
pH, UA: 6

## 2015-04-16 MED ORDER — CIPROFLOXACIN HCL 500 MG PO TABS: 500.0000 mg | ORAL_TABLET | Freq: Two times a day (BID) | ORAL | Status: DC

## 2015-04-16 NOTE — Progress Notes (Signed)
   Subjective:    Patient ID: Haley George, female    DOB: 01-20-1956, 59 y.o.   MRN: [id number]  HPI He complains of a several dayhistory of frequency, urgency and foul odor. No fever, chills or abdominal pain. Review of Systems     Objective:   Physical Exam Or tendon no distress. Urine dipstick is positive for white cells.       Assessment & Plan:  Acute cystitis without hematuria - Plan: POCT urinalysis dipstick, ciprofloxacin (CIPRO) 500 MG tablet

## 2015-04-23 ENCOUNTER — Other Ambulatory Visit: Payer: Self-pay | Admitting: Cardiovascular Disease

## 2015-04-23 NOTE — Telephone Encounter (Signed)
Rx request sent to pharmacy.  

## 2015-06-16 ENCOUNTER — Ambulatory Visit: Payer: BC Managed Care – PPO | Admitting: Family Medicine

## 2015-07-03 ENCOUNTER — Ambulatory Visit: Payer: BC Managed Care – PPO | Admitting: Cardiovascular Disease

## 2015-07-06 ENCOUNTER — Encounter: Payer: Self-pay | Admitting: Family Medicine

## 2015-07-06 ENCOUNTER — Ambulatory Visit (INDEPENDENT_AMBULATORY_CARE_PROVIDER_SITE_OTHER): Payer: BC Managed Care – PPO | Admitting: Family Medicine

## 2015-07-06 VITALS — BP 160/80 | HR 88 | Temp 97.7°F | Ht 65.0 in | Wt 209.4 lb

## 2015-07-06 DIAGNOSIS — J069 Acute upper respiratory infection, unspecified: Secondary | ICD-10-CM

## 2015-07-06 DIAGNOSIS — R062 Wheezing: Secondary | ICD-10-CM | POA: Diagnosis not present

## 2015-07-06 DIAGNOSIS — J029 Acute pharyngitis, unspecified: Secondary | ICD-10-CM | POA: Diagnosis not present

## 2015-07-06 DIAGNOSIS — R05 Cough: Secondary | ICD-10-CM | POA: Diagnosis not present

## 2015-07-06 DIAGNOSIS — R059 Cough, unspecified: Secondary | ICD-10-CM

## 2015-07-06 LAB — POCT RAPID STREP A (OFFICE): RAPID STREP A SCREEN: NEGATIVE

## 2015-07-06 MED ORDER — BENZONATATE 200 MG PO CAPS: 200.0000 mg | ORAL_CAPSULE | Freq: Three times a day (TID) | ORAL | Status: DC | PRN

## 2015-07-06 MED ORDER — ALBUTEROL SULFATE HFA 108 (90 BASE) MCG/ACT IN AERS: 2.0000 | INHALATION_SPRAY | Freq: Four times a day (QID) | RESPIRATORY_TRACT | Status: DC | PRN

## 2015-07-06 NOTE — Progress Notes (Signed)
Chief Complaint  Patient presents with  . Sore Throat    started last Tuesday with painful throat and the pain went up into her ears. Has been running temps of 101 since last week, thinks may have broken last night but did not take temp. No chills or body aches. ST is better but still bothering her. Has yellow-green mucus, just changed yesterday to discolored. Does have a cough. Wants to make sure she doesn't have strep.    Started 6 days ago with scratchy throat, followed by congestion. 4 days ago she started with fever, Tmax of 101.5.  Denies myalgias, didn't feel like flu.  Fevers of 101 persisted until last night, when she woke up in a big sweat.  No fever since then.  Mucus had been clear, changed last night from clear/white to a darker yellow. Coughing up phlegm, which was clear until yesterday, switched to a light yellow. Denies headaches, just some fullness in her cheeks bilaterally. She still has discomfort from her throat to her ears with swallowing.  Denies shortness of breath.  No known sick contacts.  She hadn't really left the house much until last Monday (due to caring for daughter who had surgery), when she went to 3 different grocery stores--started feeling sick the following day.  OTC meds taken--Alka selzer cold plus, last taken 11pm last night. Nothing this morning.  PMH, PSH, [place] reviewed in detail  Outpatient Encounter Prescriptions as of 07/06/2015  Medication Sig Note  . aspirin 81 MG tablet Take 81 mg by mouth daily.   . B-D ULTRAFINE III SHORT PEN 31G X 8 MM MISC USE AS DIRECTED   . co-enzyme Q-10 50 MG capsule Take 50 mg by mouth daily.     . Cranberry 500 MG CAPS Take by mouth.   . fenofibrate (TRICOR) 145 MG tablet TAKE 1 TABLET BY MOUTH EVERY DAY   . Homeopathic Products (ZICAM COLD REMEDY PO) Take 1 tablet by mouth every 4 (four) hours as needed. 07/06/2015: Took this last Tues and Wed.  . Insulin Glargine (LANTUS SOLOSTAR) 100 UNIT/ML Solostar Pen INJECT 25 UNITS  INTO THE SKIN EVERY DAY   . isosorbide mononitrate (IMDUR) 30 MG 24 hr tablet Take 1 tablet (30 mg total) by mouth daily.   Javier Docker Oil 300 MG CAPS Take by mouth.   . metoprolol succinate (TOPROL-XL) 25 MG 24 hr tablet TAKE 1.5 TABLETS (37.5 MG TOTAL) BY MOUTH DAILY. 07/06/2015: Takes 1 tab daily  . Multiple Vitamin (MULTIVITAMIN WITH MINERALS) TABS Take 1 tablet by mouth daily.   . Omega-3 Fatty Acids (FISH OIL) 1200 MG CAPS Take 3,600 mg by mouth daily.   . pioglitazone-metformin (ACTOPLUS MET) 15-850 MG per tablet TAKE 1 TABLET BY MOUTH 2 (TWO) TIMES DAILY WITH A MEAL.   Marland Kitchen RANEXA 1000 MG SR tablet TAKE ONE TABLET BY MOUTH TWICE DAILY   . rosuvastatin (CRESTOR) 20 MG tablet TAKE 1 TABLET (20 MG TOTAL) BY MOUTH AT BEDTIME.   . valsartan (DIOVAN) 160 MG tablet TAKE 1 TABLET BY MOUTH EVERY DAY. APPT NEEDED FOR FUTURE REFILLS   . vitamin C (ASCORBIC ACID) 500 MG tablet Take 500 mg by mouth daily.  07/06/2015: Since last Tues increased to 1039m daily.  .Marland KitchenZETIA 10 MG tablet TAKE 1 TABLET (10 MG TOTAL) BY MOUTH DAILY.   . [DISCONTINUED] fish oil-omega-3 fatty acids 1000 MG capsule Take 2 g by mouth 2 (two) times daily.    .Marland KitchenNITROSTAT 0.4 MG SL tablet DISSOLVE  1 TAB UNDER THE TONGUE EVERY 5 MIN AS NEEDED FOR CHEST PAIN (MAX OF 3) (Patient not taking: Reported on 07/06/2015)   . [DISCONTINUED] ciprofloxacin (CIPRO) 500 MG tablet Take 1 tablet (500 mg total) by mouth 2 (two) times daily.   . [DISCONTINUED] KRILL OIL 1000 MG CAPS Take by mouth.   . [DISCONTINUED] pioglitazone-metformin (ACTOPLUS MET) 15-850 MG tablet TAKE 1 TABLET BY MOUTH 2 (TWO) TIMES DAILY WITH A MEAL. (Patient not taking: Reported on 04/16/2015)    No facility-administered encounter medications on file as of 07/06/2015.   Allergies  Allergen Reactions  . Penicillins Hives  . Statins    ROS:  Fever as per HPI.  URI symptoms per HPI.  No shortness of breath, nausea, vomiting, diarrhea, bleeding, bruising, rash, edema, headaches (just  sinus pressure), dizziness, chest pain.  PHYSICAL EXAM: BP 160/80 mmHg  Pulse 88  Temp(Src) 97.7 F (36.5 C) (Tympanic)  Ht _0  (1.651 m)  Wt 209 lb 6.4 oz (94.983 kg)  BMI 34.85 kg/m2 Well developed, pleasant female in no distress.  Some sniffling, no cough. HEENT: PERRL, EOMI, conjunctiva and sclera are clear. TM's and EAc's normal.  Nasal mucosa is mild-mod edematous, no erythema or purulence noted. Mildly tender over bilateral maxillary sinuses. OP is clear without erythema or exudates Neck: no lymphadenopathy or mass Heart: regular rate and rhythm without murmur Lungs: mild diffuse end-expiratory wheezes. No rales or ronchi.  Good air movement throughout.  Rapid strep negative  ASSESSMENT/PLAN:  Acute upper respiratory infection - suspect viral. supportive measures.  Can't r/o early sinusitis--call if worsening. +drug interactions with her cardiac meds  Sore throat - strep negative; related to PND/virus - Plan: Rapid Strep A  Cough - Plan: benzonatate (TESSALON) 200 MG capsule  Wheezing - Plan: albuterol (PROVENTIL HFA;VENTOLIN HFA) 108 (90 Base) MCG/ACT inhaler   Drink plenty of fluids. Avoid decongestants which may raise the blood pressure. Dextromethorphan (found in many OTC medication as cough suppressant) has an interaction with your ranexa, so let's try and avoid this. Chlorpheniramine is the active ingredient in Coricidin HBP--you;'d have to look at the labels to see if there are any kinds with out the dextromethorphan, vs getting a separate chlorpheniramine (chlor-trimeton).  You may use Mucinex (plain or extra strength, 12 hour version) along with this. I heard slight wheezing today--if your chest feels tight, short of breath, or more dry coughing spells, use the albuterol inhaler if needed. You can use benzonatate as needed for cough.  Call us in 1-2 days if you have recurrent/ongoing fever, ongoing/worsening discolored mucus, at which point we need to start an  antibiotic.

## 2015-07-06 NOTE — Patient Instructions (Signed)
  Drink plenty of fluids. Avoid decongestants which may raise the blood pressure. Dextromethorphan (found in many OTC medication as cough suppressant) has an interaction with your ranexa, so let's try and avoid this. Chlorpheniramine is the active ingredient in Coricidin HBP--you;'d have to look at the labels to see if there are any kinds with out the dextromethorphan, vs getting a separate chlorpheniramine (chlor-trimeton).  You may use Mucinex (plain or extra strength, 12 hour version) along with this. I heard slight wheezing today--if your chest feels tight, short of breath, or more dry coughing spells, use the albuterol inhaler if needed. You can use benzonatate as needed for cough.  Call us in 1-2 days if you have recurrent/ongoing fever, ongoing/worsening discolored mucus, at which point we need to start an antibiotic.

## 2015-07-16 ENCOUNTER — Ambulatory Visit (INDEPENDENT_AMBULATORY_CARE_PROVIDER_SITE_OTHER): Payer: BC Managed Care – PPO | Admitting: Cardiovascular Disease

## 2015-07-16 ENCOUNTER — Encounter: Payer: Self-pay | Admitting: Cardiovascular Disease

## 2015-07-16 VITALS — BP 148/88 | HR 80 | Ht 65.0 in | Wt 208.0 lb

## 2015-07-16 DIAGNOSIS — I152 Hypertension secondary to endocrine disorders: Secondary | ICD-10-CM

## 2015-07-16 DIAGNOSIS — I25118 Atherosclerotic heart disease of native coronary artery with other forms of angina pectoris: Secondary | ICD-10-CM

## 2015-07-16 DIAGNOSIS — E669 Obesity, unspecified: Secondary | ICD-10-CM

## 2015-07-16 DIAGNOSIS — E1159 Type 2 diabetes mellitus with other circulatory complications: Secondary | ICD-10-CM | POA: Diagnosis not present

## 2015-07-16 DIAGNOSIS — I10 Essential (primary) hypertension: Secondary | ICD-10-CM

## 2015-07-16 DIAGNOSIS — E1169 Type 2 diabetes mellitus with other specified complication: Secondary | ICD-10-CM | POA: Diagnosis not present

## 2015-07-16 DIAGNOSIS — E785 Hyperlipidemia, unspecified: Secondary | ICD-10-CM

## 2015-07-16 MED ORDER — ROSUVASTATIN CALCIUM 20 MG PO TABS: 20.0000 mg | ORAL_TABLET | Freq: Every day | ORAL | Status: DC

## 2015-07-16 MED ORDER — VALSARTAN 160 MG PO TABS: 160.0000 mg | ORAL_TABLET | Freq: Every day | ORAL | Status: DC

## 2015-07-16 MED ORDER — FENOFIBRATE 145 MG PO TABS: 145.0000 mg | ORAL_TABLET | Freq: Every day | ORAL | Status: DC

## 2015-07-16 MED ORDER — ISOSORBIDE MONONITRATE ER 30 MG PO TB24: 30.0000 mg | ORAL_TABLET | Freq: Every day | ORAL | Status: DC

## 2015-07-16 MED ORDER — EZETIMIBE 10 MG PO TABS: 10.0000 mg | ORAL_TABLET | Freq: Every day | ORAL | Status: DC

## 2015-07-16 MED ORDER — METOPROLOL SUCCINATE ER 25 MG PO TB24: 37.5000 mg | ORAL_TABLET | Freq: Every day | ORAL | Status: DC

## 2015-07-16 MED ORDER — RANOLAZINE ER 1000 MG PO TB12: 1000.0000 mg | ORAL_TABLET | Freq: Two times a day (BID) | ORAL | Status: DC

## 2015-07-16 NOTE — Progress Notes (Signed)
Patient ID: Haley George, female   DOB: [birth year], 60 y.o.   MRN: [id number]    Cardiology Office Note    Date:  07/16/2015   ID:  Haley George, DOB June 25, 1955, MRN [id number]  PCP:  Wyatt Haste, MD  Cardiologist:   Sanda Klein, MD   Chief Complaint  Patient presents with  . 3-4 WEEKS    Patient has no complaints.    History of Present Illness:  Haley George is a 60 y.o. female with previous percutaneous revascularization procedures performed in all 3 major coronary arteries and restenosis requiring repeat PCI. The last interventions were performed in 2010 when she had overlapping stents placed at the level of older stents in the mid LAD artery and in the left circumflex coronary artery. She has insulin-requiring type 2 diabetes mellitus and secondary mixed hypertriglyceridemia and hypercholesterolemia. She seems to have had substantial improvement with Ranexa. Her most recent nuclear stress test performed in November 2016 was normal.  2007 mid LAD 2.2512 mini vision, left circumflex 2.518 Cypher, RCA 3.023 Cypher 2010 mid LAD 2.2512 Taxus overlapping 2.012 mini vision, overlapping another 2.012 mini vision, left circumflex 2.516 Taxus upstream of previous stent  She is doing well. Retiring has had a very positive impact on CAD symptoms. She denies dyspnea and angina. Recent labs showed excellent DM and lipid control. Her BP is high today, but usually < 120/80. She has yet to take morning meds. She denies angina, dyspnea, edema, palpitations, syncope, neuro complaints and claudication. She is recovering after 3 weeks with URI.   Past Medical History  Diagnosis Date  . Hypertension   . Asthma   . Liver failure ([place])     h/o autoimmune hepatitis   . Hyperlipidemia   . Kidney stones   . Psoriasis   . Depression   . Coronary artery disease   . Diabetes mellitus without complication ([place])     type 2  . Obesity   . History of nuclear stress test 05/21/2010   dipyridamole; normal perfusion, preserved LV systolic EF of 53%    Past Surgical History  Procedure Laterality Date  . Ureteral stents    . Coronary angioplasty with stent placement  09/2008    2.25x39m Cypher DES to in-stent restenosis of prox LAD; Mini-Vision stent x2 2.0x166mto area distal of initial LAD stent; 3 2.5x1543maxus stents prox to Cypher stent in circumflex  . Coronary angioplasty with stent placement  09/2005    Cypher DES 3.0x22m9m distal RCA; 2.25x12mm26mi0Vision stent to mid LAD; 2.5x18mm 75mer stent to circumflex  . Transthoracic echocardiogram  04/2010    EF=>55%, borderline conc LVH; trace MR & TR; AV mildly sclerotic; aortic root sclerosis/calcif  . Carotid doppler  10/2008    R & L ICAs 0-49% diameter reduction     Outpatient Prescriptions Prior to Visit  Medication Sig Dispense Refill  . albuterol (PROVENTIL HFA;VENTOLIN HFA) 108 (90 Base) MCG/ACT inhaler Inhale 2 puffs into the lungs every 6 (six) hours as needed for wheezing or shortness of breath. 18 g 0  . aspirin 81 MG tablet Take 81 mg by mouth daily.    . B-D ULTRAFINE III SHORT PEN 31G X 8 MM MISC USE AS DIRECTED 100 each 0  . benzonatate (TESSALON) 200 MG capsule Take 1 capsule (200 mg total) by mouth 3 (three) times daily as needed. 30 capsule 0  . co-enzyme Q-10 50 MG capsule Take 50 mg by mouth daily.      .Marland Kitchen  Cranberry 500 MG CAPS Take by mouth.    . Homeopathic Products (ZICAM COLD REMEDY PO) Take 1 tablet by mouth every 4 (four) hours as needed.    . Insulin Glargine (LANTUS SOLOSTAR) 100 UNIT/ML Solostar Pen INJECT 25 UNITS INTO THE SKIN EVERY DAY 20 mL 3  . Krill Oil 300 MG CAPS Take by mouth.    . Multiple Vitamin (MULTIVITAMIN WITH MINERALS) TABS Take 1 tablet by mouth daily.    Marland Kitchen NITROSTAT 0.4 MG SL tablet DISSOLVE 1 TAB UNDER THE TONGUE EVERY 5 MIN AS NEEDED FOR CHEST PAIN (MAX OF 3) 25 tablet 1  . Omega-3 Fatty Acids (FISH OIL) 1200 MG CAPS Take 3,600 mg by mouth daily.    .  pioglitazone-metformin (ACTOPLUS MET) 15-850 MG per tablet TAKE 1 TABLET BY MOUTH 2 (TWO) TIMES DAILY WITH A MEAL. 60 tablet 4  . vitamin C (ASCORBIC ACID) 500 MG tablet Take 500 mg by mouth daily.     . fenofibrate (TRICOR) 145 MG tablet TAKE 1 TABLET BY MOUTH EVERY DAY 90 tablet 3  . isosorbide mononitrate (IMDUR) 30 MG 24 hr tablet Take 1 tablet (30 mg total) by mouth daily. 30 tablet 11  . metoprolol succinate (TOPROL-XL) 25 MG 24 hr tablet TAKE 1.5 TABLETS (37.5 MG TOTAL) BY MOUTH DAILY. 45 tablet 8  . RANEXA 1000 MG SR tablet TAKE ONE TABLET BY MOUTH TWICE DAILY 60 tablet 6  . rosuvastatin (CRESTOR) 20 MG tablet TAKE 1 TABLET (20 MG TOTAL) BY MOUTH AT BEDTIME. 30 tablet 1  . valsartan (DIOVAN) 160 MG tablet TAKE 1 TABLET BY MOUTH EVERY DAY. APPT NEEDED FOR FUTURE REFILLS 30 tablet 4  . ZETIA 10 MG tablet TAKE 1 TABLET (10 MG TOTAL) BY MOUTH DAILY. 30 tablet 4   No facility-administered medications prior to visit.     Allergies:   Penicillins and Statins   Social History   Social History  . Marital Status: Single    Spouse Name: N/A  . Number of Children: 1  . Years of Education: N/A   Occupational History  .     Social History Main Topics  . Smoking status: Never Smoker   . Smokeless tobacco: Never Used  . Alcohol Use: No  . Drug Use: No  . Sexual Activity: Not Asked   Other Topics Concern  . None   Social History Narrative     ROS:   Please see the history of present illness.    ROS All other systems reviewed and are negative.   PHYSICAL EXAM:   VS:  BP 148/88 mmHg  Pulse 80  Ht '5\' 5"'  (1.651 m)  Wt 94.348 kg (208 lb)  BMI 34.61 kg/m2   GEN: Well nourished, well developed, in no acute distress HEENT: normal Neck: no JVD, carotid bruits, or masses Cardiac: RRR; no murmurs, rubs, or gallops,no edema  Respiratory:  clear to auscultation bilaterally, normal work of breathing GI: soft, nontender, nondistended, + BS MS: no deformity or atrophy Skin: warm and  dry, no rash Neuro:  Alert and Oriented x 3, Strength and sensation are intact Psych: euthymic mood, full affect  Wt Readings from Last 3 Encounters:  07/16/15 94.348 kg (208 lb)  07/06/15 94.983 kg (209 lb 6.4 oz)  04/16/15 94.802 kg (209 lb)      Studies/Labs Reviewed:   EKG:  EKG is ordered today.  The ekg ordered today demonstrates NSR, QTC 442 ms  Recent Labs: No results found for requested labs  within last 365 days.   Lipid Panel    Component Value Date/Time   CHOL 129 01/03/2014 0911   TRIG 203* 01/03/2014 0911   HDL 32* 01/03/2014 0911   CHOLHDL 4.0 01/03/2014 0911   VLDL 41* 01/03/2014 0911   LDLCALC 56 01/03/2014 0911   LDLDIRECT 93 11/15/2012 0829       ASSESSMENT:    1. Coronary artery disease involving native coronary artery of native heart with other form of angina pectoris (HCC)   2. Obesity (BMI 30-39.9)   3. Hyperlipidemia associated with type 2 diabetes mellitus ([place])   4. Hypertension associated with diabetes ([place])      PLAN:  In order of problems listed above:  1. CAD: good angina control with Ranexa, Low risk recent nuclear study. 2. Little weight loss progress 3. HLP: severely elevated at baseline, good on compicated regimen of statin, fibrates and zetia. chol and LDL are great on statin, chronic low HDL and  Mildly high TG 4. HTN: No changes in meds    Medication Adjustments/Labs and Tests Ordered: Current medicines are reviewed at length with the patient today.  Concerns regarding medicines are outlined above.  Medication changes, Labs and Tests ordered today are listed in the Patient Instructions below. Patient Instructions  Your physician recommends that you continue on your current medications as directed. Please refer to the Current Medication list given to you today.  Dr Sallyanne Kuster recommends that you schedule a follow-up appointment in 6 months. You will receive a reminder letter in the mail two months in advance. If you don't  receive a letter, please call our office to schedule the follow-up appointment.  If you need a refill on your cardiac medications before your next appointment, please call your pharmacy.      Mikael Spray, MD  07/16/2015 11:15 PM    [facility] [place], [place], [place]  [address] Phone: [phone]; Fax: [phone]

## 2015-07-16 NOTE — Patient Instructions (Signed)
Your physician recommends that you continue on your current medications as directed. Please refer to the Current Medication list given to you today.  Dr Croitoru recommends that you schedule a follow-up appointment in 6 months. You will receive a reminder letter in the mail two months in advance. If you don't receive a letter, please call our office to schedule the follow-up appointment.  If you need a refill on your cardiac medications before your next appointment, please call your pharmacy. 

## 2015-07-17 NOTE — Addendum Note (Signed)
Addended by: Diana Eves on: 07/17/2015 09:48 AM   Modules accepted: Orders

## 2015-08-29 ENCOUNTER — Other Ambulatory Visit: Payer: Self-pay | Admitting: Family Medicine

## 2015-11-30 ENCOUNTER — Telehealth: Payer: Self-pay | Admitting: Family Medicine

## 2015-11-30 ENCOUNTER — Other Ambulatory Visit: Payer: Self-pay | Admitting: Family Medicine

## 2015-11-30 ENCOUNTER — Other Ambulatory Visit: Payer: Self-pay

## 2015-11-30 MED ORDER — INSULIN DETEMIR 100 UNIT/ML FLEXPEN: 25.0000 [IU] | PEN_INJECTOR | Freq: Every day | SUBCUTANEOUS | 1 refills | Status: DC

## 2015-11-30 MED ORDER — INSULIN PEN NEEDLE 31G X 8 MM MISC: 3 refills | Status: DC

## 2015-11-30 NOTE — Telephone Encounter (Signed)
Call in the Levemir at the equivalent dosing. Explain to her that the side effects should be no different than her Lantus.

## 2015-11-30 NOTE — Telephone Encounter (Signed)
Pt informed and verbalized understanding

## 2015-11-30 NOTE — Telephone Encounter (Signed)
Pt called and states that her insurance is charging her $1000 for her lantus solostar pens, she needs something cheaper with her insurance, pt states she has [employer], she needs something way cheaper,please advise pt she would like something asap she will run out in 3 days and would like to know what she will be taking, she states that she has checked with her insurance and levemir was covered and has questions about the side effects  please call her, pt can be reached at   [phone] (M) and [id number] - [place], [place]

## 2015-12-02 NOTE — Telephone Encounter (Signed)
error 

## 2016-01-07 ENCOUNTER — Telehealth: Payer: Self-pay

## 2016-01-07 ENCOUNTER — Other Ambulatory Visit: Payer: Self-pay

## 2016-01-07 MED ORDER — PIOGLITAZONE HCL-METFORMIN HCL 15-850 MG PO TABS: ORAL_TABLET | ORAL | 2 refills | Status: DC

## 2016-01-07 NOTE — Telephone Encounter (Signed)
Sent in med

## 2016-01-07 NOTE — Telephone Encounter (Signed)
Needs refill of acto to [facility] pharmacy

## 2016-01-22 ENCOUNTER — Telehealth: Payer: Self-pay

## 2016-01-22 NOTE — Telephone Encounter (Signed)
Request for surgical clearance:   1. What type surgery is being performed? Right shoulder scope with rotator cuff repair  2. When is this surgery scheduled? pending  3. Are there any medications that need to be held prior to surgery and how long? N/A; patient is taking ASA 81 mg QD  4. Name of the physician performing surgery: Dr Earlie Server  5. What is the office phone and fax number?   Phone [phone]  Fax [phone]

## 2016-01-25 ENCOUNTER — Encounter: Payer: Self-pay | Admitting: Cardiovascular Disease

## 2016-01-25 NOTE — Telephone Encounter (Signed)
Sent via epic 

## 2016-01-26 ENCOUNTER — Telehealth: Payer: Self-pay | Admitting: Family Medicine

## 2016-01-26 LAB — HM MAMMOGRAPHY

## 2016-01-26 NOTE — Telephone Encounter (Signed)
Called pt to schedule appt for pre op clearance for ortho surgery. Pt said her husband will have surgery on 02/01/16 so she will be helping him through surgery & recovery so pt wants to wait to schedule her clearance appt later since she left her surgery date open ended at [facility] anyway.

## 2016-01-27 ENCOUNTER — Encounter: Payer: Self-pay | Admitting: Family Medicine

## 2016-01-28 NOTE — Telephone Encounter (Signed)
Corrected letter sent.

## 2016-02-03 NOTE — Telephone Encounter (Signed)
Faxed corrected letter to [employer] at [employer].

## 2016-02-12 LAB — HM DIABETES EYE EXAM

## 2016-03-16 ENCOUNTER — Other Ambulatory Visit: Payer: Self-pay | Admitting: Cardiovascular Disease

## 2016-03-30 ENCOUNTER — Other Ambulatory Visit: Payer: Self-pay

## 2016-03-30 ENCOUNTER — Telehealth: Payer: Self-pay

## 2016-03-30 MED ORDER — OSELTAMIVIR PHOSPHATE 75 MG PO CAPS: 75.0000 mg | ORAL_CAPSULE | Freq: Every day | ORAL | 0 refills | Status: DC

## 2016-03-30 NOTE — Telephone Encounter (Signed)
Pt informed

## 2016-03-30 NOTE — Telephone Encounter (Signed)
Find out what is going on. It sounds like maybe the daughter has the flu

## 2016-03-30 NOTE — Telephone Encounter (Signed)
Let her know that I called in the medication.

## 2016-03-30 NOTE — Telephone Encounter (Signed)
Pt states she was advised by daughter's provider to call and get Tamiflu. The daughter is staying with her mother right now. Please advise. [facility] [place].

## 2016-03-30 NOTE — Addendum Note (Signed)
Addended by: Denita Lung on: 03/30/2016 04:17 PM   Modules accepted: Orders

## 2016-03-30 NOTE — Telephone Encounter (Signed)
Yes he daughter has the flu

## 2016-03-30 NOTE — Telephone Encounter (Signed)
Her daughter has a fluid she would like to be protected. I will call in Tamiflu.

## 2016-04-06 ENCOUNTER — Other Ambulatory Visit: Payer: Self-pay | Admitting: Family Medicine

## 2016-06-14 ENCOUNTER — Other Ambulatory Visit: Payer: Self-pay | Admitting: Internal Medicine

## 2016-06-14 NOTE — Telephone Encounter (Signed)
Rx(s) sent to pharmacy electronically.  

## 2016-07-02 ENCOUNTER — Other Ambulatory Visit: Payer: Self-pay | Admitting: Family Medicine

## 2016-07-05 ENCOUNTER — Other Ambulatory Visit: Payer: Self-pay | Admitting: Cardiovascular Disease

## 2016-07-05 NOTE — Telephone Encounter (Signed)
Rx(s) sent to pharmacy electronically.  

## 2016-07-12 ENCOUNTER — Other Ambulatory Visit (HOSPITAL_COMMUNITY)
Admission: RE | Admit: 2016-07-12 | Discharge: 2016-07-12 | Disposition: A | Discharge status: Home / Self Care | Payer: BC Managed Care – PPO | Source: Ambulatory Visit | Attending: Family Medicine | Admitting: Family Medicine

## 2016-07-12 ENCOUNTER — Encounter: Payer: Self-pay | Admitting: Family Medicine

## 2016-07-12 ENCOUNTER — Ambulatory Visit (INDEPENDENT_AMBULATORY_CARE_PROVIDER_SITE_OTHER): Payer: BC Managed Care – PPO | Admitting: Family Medicine

## 2016-07-12 ENCOUNTER — Telehealth: Payer: Self-pay

## 2016-07-12 VITALS — BP 128/84 | HR 73 | Ht 65.0 in | Wt 214.0 lb

## 2016-07-12 DIAGNOSIS — L409 Psoriasis, unspecified: Secondary | ICD-10-CM | POA: Diagnosis not present

## 2016-07-12 DIAGNOSIS — E669 Obesity, unspecified: Secondary | ICD-10-CM

## 2016-07-12 DIAGNOSIS — Z124 Encounter for screening for malignant neoplasm of cervix: Secondary | ICD-10-CM

## 2016-07-12 DIAGNOSIS — E118 Type 2 diabetes mellitus with unspecified complications: Secondary | ICD-10-CM | POA: Diagnosis not present

## 2016-07-12 DIAGNOSIS — E785 Hyperlipidemia, unspecified: Secondary | ICD-10-CM

## 2016-07-12 DIAGNOSIS — I2510 Atherosclerotic heart disease of native coronary artery without angina pectoris: Secondary | ICD-10-CM

## 2016-07-12 DIAGNOSIS — Z23 Encounter for immunization: Secondary | ICD-10-CM

## 2016-07-12 DIAGNOSIS — E1169 Type 2 diabetes mellitus with other specified complication: Secondary | ICD-10-CM | POA: Diagnosis not present

## 2016-07-12 DIAGNOSIS — E1159 Type 2 diabetes mellitus with other circulatory complications: Secondary | ICD-10-CM

## 2016-07-12 DIAGNOSIS — Z01419 Encounter for gynecological examination (general) (routine) without abnormal findings: Secondary | ICD-10-CM | POA: Insufficient documentation

## 2016-07-12 DIAGNOSIS — I152 Hypertension secondary to endocrine disorders: Secondary | ICD-10-CM

## 2016-07-12 DIAGNOSIS — Z0000 Encounter for general adult medical examination without abnormal findings: Secondary | ICD-10-CM | POA: Diagnosis not present

## 2016-07-12 DIAGNOSIS — I10 Essential (primary) hypertension: Secondary | ICD-10-CM

## 2016-07-12 LAB — POCT URINALYSIS DIPSTICK
BILIRUBIN UA: NEGATIVE
GLUCOSE UA: NEGATIVE
KETONES UA: NEGATIVE
NITRITE UA: NEGATIVE
PH UA: 6 (ref 5.0–8.0)
Protein, UA: POSITIVE
RBC UA: NEGATIVE
Spec Grav, UA: 1.03 (ref 1.030–1.035)
Urobilinogen, UA: NEGATIVE (ref ?–2.0)

## 2016-07-12 LAB — CBC WITH DIFFERENTIAL/PLATELET
BASOS ABS: 0 {cells}/uL (ref 0–200)
Basophils Relative: 0 %
EOS ABS: 245 {cells}/uL (ref 15–500)
Eosinophils Relative: 7 %
HEMATOCRIT: 38.3 % (ref 35.0–45.0)
Hemoglobin: 12.4 g/dL (ref 11.7–15.5)
Lymphocytes Relative: 28 %
Lymphs Abs: 980 cells/uL (ref 850–3900)
MCH: 29.3 pg (ref 27.0–33.0)
MCHC: 32.4 g/dL (ref 32.0–36.0)
MCV: 90.5 fL (ref 80.0–100.0)
MPV: 9.7 fL (ref 7.5–12.5)
Monocytes Absolute: 280 cells/uL (ref 200–950)
Monocytes Relative: 8 %
NEUTROS ABS: 1995 {cells}/uL (ref 1500–7800)
NEUTROS PCT: 57 %
PLATELETS: 269 10*3/uL (ref 140–400)
RBC: 4.23 MIL/uL (ref 3.80–5.10)
RDW: 13.2 % (ref 11.0–15.0)
WBC: 3.5 10*3/uL — ABNORMAL LOW (ref 4.0–10.5)

## 2016-07-12 LAB — LIPID PANEL
CHOLESTEROL: 175 mg/dL (ref <200)
HDL: 34 mg/dL — ABNORMAL LOW (ref >50)
LDL Cholesterol: 91 mg/dL (ref <100)
TRIGLYCERIDES: 249 mg/dL — AB (ref <150)
Total CHOL/HDL Ratio: 5.1 Ratio — ABNORMAL HIGH (ref <5.0)
VLDL: 50 mg/dL — AB (ref <30)

## 2016-07-12 LAB — POCT UA - MICROALBUMIN
ALBUMIN/CREATININE RATIO, URINE, POC: 58.3
Creatinine, POC: 215 mg/dL
Microalbumin Ur, POC: 125.4 mg/L

## 2016-07-12 LAB — COMPREHENSIVE METABOLIC PANEL
ALBUMIN: 4.5 g/dL (ref 3.6–5.1)
ALK PHOS: 35 U/L (ref 33–130)
ALT: 20 U/L (ref 6–29)
AST: 38 U/L — ABNORMAL HIGH (ref 10–35)
BILIRUBIN TOTAL: 0.4 mg/dL (ref 0.2–1.2)
BUN: 18 mg/dL (ref 7–25)
CHLORIDE: 105 mmol/L (ref 98–110)
CO2: 24 mmol/L (ref 20–31)
CREATININE: 1.04 mg/dL (ref 0.50–1.05)
Calcium: 10 mg/dL (ref 8.6–10.4)
Glucose, Bld: 120 mg/dL — ABNORMAL HIGH (ref 65–99)
Potassium: 4.4 mmol/L (ref 3.5–5.3)
SODIUM: 141 mmol/L (ref 135–146)
TOTAL PROTEIN: 7.6 g/dL (ref 6.1–8.1)

## 2016-07-12 LAB — POCT GLYCOSYLATED HEMOGLOBIN (HGB A1C): Hemoglobin A1C: 7.1

## 2016-07-12 MED ORDER — VALSARTAN 160 MG PO TABS: 160.0000 mg | ORAL_TABLET | Freq: Every day | ORAL | 3 refills | Status: DC

## 2016-07-12 MED ORDER — EZETIMIBE 10 MG PO TABS: 10.0000 mg | ORAL_TABLET | Freq: Every day | ORAL | 3 refills | Status: DC

## 2016-07-12 MED ORDER — ROSUVASTATIN CALCIUM 20 MG PO TABS: 20.0000 mg | ORAL_TABLET | Freq: Every day | ORAL | 3 refills | Status: DC

## 2016-07-12 MED ORDER — INSULIN DETEMIR 100 UNIT/ML FLEXPEN: PEN_INJECTOR | SUBCUTANEOUS | 1 refills | Status: DC

## 2016-07-12 MED ORDER — METOPROLOL SUCCINATE ER 25 MG PO TB24: 37.5000 mg | ORAL_TABLET | Freq: Every day | ORAL | 3 refills | Status: DC

## 2016-07-12 MED ORDER — METFORMIN HCL 500 MG PO TABS: 500.0000 mg | ORAL_TABLET | Freq: Two times a day (BID) | ORAL | 3 refills | Status: DC

## 2016-07-12 MED ORDER — FENOFIBRATE 145 MG PO TABS: 145.0000 mg | ORAL_TABLET | Freq: Every day | ORAL | 3 refills | Status: DC

## 2016-07-12 NOTE — Telephone Encounter (Signed)
Pt called the office with questions of whether she should take her insulin and oral DM medications. Pt states she has not eaten at all today. Spoke with Cheri and we offered patient the opportunity to come on in to get labs drawn this morning before her physical, as she is diabetic and didn't need to fast that long. Advised pt of this and she states, " I can't do that, I have appointments this morning. I'll just deal with it." Pt then disconnected the call.  Please advise if any recommendations for pt about medications. [name]

## 2016-07-12 NOTE — Patient Instructions (Signed)
Take only 10 units of your insulin and keep track of your blood sugars. Call me on  My chart and give me all the numbers

## 2016-07-12 NOTE — Progress Notes (Signed)
Subjective:    Patient ID: Haley George, female    DOB: 07-08-1955, 61 y.o.   MRN: [id number]  HPI She is here for complete examination. Review of the record indicates she has not been here in over a year and 1 question about that could not give me a good reason. Her medications were not renewed that I could tell when I reviewed records but she continues on medications listed on them medication list. It was a previous question of difficulty with statins however presently she is on Crestor and seems be doing fairly well on this. She continues on Zetia as well as fenofibrate. She is taking her Levemir usually 3 times per week stating she forgets to take it. She also is taking Actos plus. She checks her feet sporadically, has had an eye exam within the last year but no record of this. She does have underlying heart disease but has not had any chest pain, shortness of breath, PND. She does see her cardiologist yearly and apparently was recently taken off of Plavix. Her exercise is quite minimal. She does not ascribe to a diabetic type diet. She has very little difficulty with her psoriasis.   Review of Systems  All other systems reviewed and are negative.      Objective:   Physical Exam BP 128/84   Pulse 73   Ht 5\' 5"  (1.651 m)   Wt 214 lb (97.1 kg)   SpO2 97%   BMI 35.61 kg/m   General Appearance:    Alert, cooperative, no distress, appears stated age  Head:    Normocephalic, without obvious abnormality, atraumatic  Eyes:    PERRL, conjunctiva/corneas clear, EOM's intact, fundi    benign  Ears:    Normal TM's and external ear canals  Nose:   Nares normal, mucosa normal, no drainage or sinus   tenderness  Throat:   Lips, mucosa, and tongue normal; teeth and gums normal  Neck:   Supple, no lymphadenopathy;  thyroid:  no   enlargement/tenderness/nodules; no carotid   bruit or JVD     Lungs:     Clear to auscultation bilaterally without wheezes, rales or     ronchi; respirations  unlabored      Heart:    Regular rate and rhythm, S1 and S2 normal, no murmur, rub   or gallop  Breast Exam:  Deferred  Abdomen:     Soft, non-tender, nondistended, normoactive bowel sounds,    no masses, no hepatosplenomegaly  Genitalia:    Normal external genitalia without lesions.  BUS and vagina normal; cervix without lesions, or cervical motion tenderness. No abnormal vaginal discharge.  Uterus and adnexa not enlarged, nontender, no masses.  Pap performed  Rectal:   Deferred l  Extremities:   No clubbing, cyanosis or edema  Pulses:   2+ and symmetric all extremities  Skin:   Skin color, texture, turgor normal, no rashes or lesions  Lymph nodes:   Cervical, supraclavicular, and axillary nodes normal  Neurologic:   CNII-XII intact, normal strength, sensation and gait; reflexes 2+ and symmetric throughout          Psych:   Normal mood, affect, hygiene and grooming.   Hemoglobin A1c is 7.1     Assessment & Plan:  Routine general medical examination at a health care facility - Plan: POCT Urinalysis Dipstick, CBC with Differential/Platelet, Comprehensive metabolic panel, Lipid panel  Hypertension associated with diabetes ([place]) - Plan: valsartan (DIOVAN) 160 MG tablet, metoprolol succinate (TOPROL-XL)  25 MG 24 hr tablet  Obesity (BMI 30-39.9) - Plan: CBC with Differential/Platelet, Comprehensive metabolic panel, Lipid panel  Diabetes mellitus with complication (HCC) - Plan: POCT Urinalysis Dipstick, HgB A1c, CBC with Differential/Platelet, Comprehensive metabolic panel, Lipid panel, POCT UA - Microalbumin, Insulin Detemir (LEVEMIR) 100 UNIT/ML Pen, metFORMIN (GLUCOPHAGE) 500 MG tablet  Hyperlipidemia associated with type 2 diabetes mellitus (HCC) - Plan: Lipid panel, ezetimibe (ZETIA) 10 MG tablet, fenofibrate (TRICOR) 145 MG tablet, rosuvastatin (CRESTOR) 20 MG tablet  ASHD (arteriosclerotic heart disease) - Plan: CBC with Differential/Platelet, Comprehensive metabolic panel, Lipid  panel  Psoriasis - Plan: valsartan (DIOVAN) 160 MG tablet, metoprolol succinate (TOPROL-XL) 25 MG 24 hr tablet  Need for Tdap vaccination - Plan: Tdap vaccine greater than or equal to 7yo IM  Need for vaccination with 13-polyvalent pneumococcal conjugate vaccine - Plan: Pneumococcal conjugate vaccine 13-valent  Need for prophylactic vaccination against human papillomavirus  Screening for cervical cancer - Plan: Cytology - PAP Carlyss I will take her off Actos plus and give her plain metformin. Will also back off on her insulin as I'm not sure it's really needed considering the fact that her A1c is 7.1.. She will be on 10 units and keep track of her blood sugars and over the next 2 weeks let me know how she is doing. She will continue on her blood pressure medications as well as Zetia and TriCor as well as Crestor. We'll readdress this at a later date. Her immunizations were updated. I then discussed the fact that she needs to come in on a more regular basis specifically every 4 months. Also discussed the need for her to make diet and exercise changes. The majority of the time was spent dealing with psychological issue of her always putting everybody else's needs above her own.

## 2016-07-13 ENCOUNTER — Telehealth: Payer: Self-pay

## 2016-07-13 NOTE — Telephone Encounter (Signed)
500 mg metformin  Is what she had so I told her to take 1 tablet twice a day then pick up hers when she finishes the extra she had .  Also she wanted to know her results of her UA she still doesn't feel better today please advise

## 2016-07-13 NOTE — Telephone Encounter (Signed)
Left message for pt to come by and leave urine sample again

## 2016-07-13 NOTE — Telephone Encounter (Signed)
Have her drop off another specimen and that send it for culture and I will minute and look at it microscopically.

## 2016-07-15 LAB — CYTOLOGY - PAP: Diagnosis: NEGATIVE

## 2016-07-22 ENCOUNTER — Other Ambulatory Visit: Payer: Self-pay | Admitting: Cardiovascular Disease

## 2016-07-22 NOTE — Telephone Encounter (Signed)
Rx request sent to pharmacy.  

## 2016-08-04 ENCOUNTER — Other Ambulatory Visit: Payer: Self-pay | Admitting: Cardiovascular Disease

## 2016-08-10 ENCOUNTER — Other Ambulatory Visit: Payer: Self-pay | Admitting: Internal Medicine

## 2016-08-10 NOTE — Telephone Encounter (Signed)
Rx(s) sent to pharmacy electronically.  

## 2016-08-19 ENCOUNTER — Telehealth: Payer: Self-pay | Admitting: Cardiovascular Disease

## 2016-08-19 NOTE — Telephone Encounter (Signed)
REFILL 

## 2016-08-19 NOTE — Telephone Encounter (Signed)
New Message  Pt c/o medication issue:  1. Name of Medication: Isosorbide   2. How are you currently taking this medication (dosage and times per day)? 30mg    3. Are you having a reaction (difficulty breathing--STAT)? no  4. What is your medication issue? Per pt is upset and would like to speak with someone about the medication and how it was prescribed to her. Pt states she had to pay full price for on half of the medication. Please call back to discuss

## 2016-08-23 MED ORDER — RANOLAZINE ER 1000 MG PO TB12: 1000.0000 mg | ORAL_TABLET | Freq: Two times a day (BID) | ORAL | 2 refills | Status: DC

## 2016-08-23 NOTE — Telephone Encounter (Signed)
New message    Pt is calling asking for a call back from RN she said it is about her medication.

## 2016-08-23 NOTE — Telephone Encounter (Signed)
Spoke to the patient. She stated that she needs a refill on Ranexa. A three month supply has been called into [facility] per her request. Her next appointment is 7/13 with Dr. Sallyanne Kuster.   The patient stated that she is upset that her first refill of Isosorbide was only refilled for two weeks. She was informed that it is policy since she had not been seen in a year. She has recently made her follow up appointment and her prescriptions have been refilled through that time. The patient verbalized her understanding.

## 2016-08-29 ENCOUNTER — Other Ambulatory Visit (INDEPENDENT_AMBULATORY_CARE_PROVIDER_SITE_OTHER): Payer: BC Managed Care – PPO

## 2016-08-29 ENCOUNTER — Other Ambulatory Visit: Payer: Self-pay

## 2016-08-29 DIAGNOSIS — R8290 Unspecified abnormal findings in urine: Secondary | ICD-10-CM | POA: Diagnosis not present

## 2016-08-29 LAB — POCT URINALYSIS DIPSTICK
BILIRUBIN UA: NEGATIVE
KETONES UA: NEGATIVE
Nitrite, UA: NEGATIVE
Protein, UA: POSITIVE
RBC UA: NEGATIVE
pH, UA: 6 (ref 5.0–8.0)

## 2016-08-29 MED ORDER — SULFAMETHOXAZOLE-TRIMETHOPRIM 800-160 MG PO TABS: 1.0000 | ORAL_TABLET | Freq: Two times a day (BID) | ORAL | 0 refills | Status: DC

## 2016-08-29 NOTE — Progress Notes (Signed)
Patient came back in this morning after a message for her was left on 07/13/16 to please come back and leave a urine sample. Her urine is loaded with LEU Dr.Lalonde has viewed urine under microscope and asked me to call in septra BID for pt .

## 2016-09-13 ENCOUNTER — Telehealth: Payer: Self-pay | Admitting: Cardiovascular Disease

## 2016-09-13 NOTE — Telephone Encounter (Signed)
Pllease call,pt is so tired,weak and short of breath. She says she sometimes feels dizzy.Pt has an appt tomorrow with Dr Federico Flake is so tired.

## 2016-09-13 NOTE — Telephone Encounter (Signed)
Patient called to state that she has been short of breath since last Saturday. It is mainly upon exertion. She stated that she gets clammy and occasionally feels  slight chest pressure when she is being active. She has not been able to check her blood pressure and denies any edema. An appointment has been made for tomorrow, 5/22, with Dr. Sallyanne Kuster.

## 2016-09-14 ENCOUNTER — Encounter: Payer: Self-pay | Admitting: Cardiovascular Disease

## 2016-09-14 ENCOUNTER — Ambulatory Visit (INDEPENDENT_AMBULATORY_CARE_PROVIDER_SITE_OTHER): Payer: BC Managed Care – PPO | Admitting: Cardiovascular Disease

## 2016-09-14 VITALS — BP 142/77 | HR 89 | Ht 65.0 in | Wt 211.4 lb

## 2016-09-14 DIAGNOSIS — E782 Mixed hyperlipidemia: Secondary | ICD-10-CM

## 2016-09-14 DIAGNOSIS — I10 Essential (primary) hypertension: Secondary | ICD-10-CM

## 2016-09-14 DIAGNOSIS — R0602 Shortness of breath: Secondary | ICD-10-CM

## 2016-09-14 DIAGNOSIS — I152 Hypertension secondary to endocrine disorders: Secondary | ICD-10-CM

## 2016-09-14 DIAGNOSIS — I350 Nonrheumatic aortic (valve) stenosis: Secondary | ICD-10-CM | POA: Diagnosis not present

## 2016-09-14 DIAGNOSIS — E118 Type 2 diabetes mellitus with unspecified complications: Secondary | ICD-10-CM

## 2016-09-14 DIAGNOSIS — E1159 Type 2 diabetes mellitus with other circulatory complications: Secondary | ICD-10-CM

## 2016-09-14 DIAGNOSIS — E669 Obesity, unspecified: Secondary | ICD-10-CM | POA: Diagnosis not present

## 2016-09-14 NOTE — Patient Instructions (Signed)
Dr Sallyanne Kuster recommends that you continue on your current medications as directed. Please refer to the Current Medication list given to you today.  Your physician has requested that you have an echocardiogram. Echocardiography is a painless test that uses sound waves to create images of your heart. It provides your doctor with information about the size and shape of your heart and how well your heart's chambers and valves are working. This procedure takes approximately one hour. There are no restrictions for this procedure. This will be performed at our [facility] location - [address].  Dr Sallyanne Kuster recommends that you schedule a follow-up appointment first available.  If you need a refill on your cardiac medications before your next appointment, please call your pharmacy.

## 2016-09-14 NOTE — Progress Notes (Signed)
Patient ID: DARENE NAPPI, female   DOB: 1955/09/20, 61 y.o.   MRN: [id number]    Cardiology Office Note    Date:  09/14/2016   ID:  Haley George, DOB 04/10/1956, MRN [id number]  PCP:  Denita Lung, MD  Cardiologist:   Sanda Klein, MD   Chief Complaint  Patient presents with  . Follow-up    pt c/o heaviness, chest congestion and pressure, SOB, DOE    History of Present Illness:  Haley George is a 61 y.o. female with previous percutaneous revascularization procedures performed in all 3 major coronary arteries and restenosis requiring repeat PCI. The last interventions were performed in 2010 when she had overlapping stents placed at the level of older stents in the mid LAD artery and in the left circumflex coronary artery. She has insulin-requiring type 2 diabetes mellitus and secondary mixed hypertriglyceridemia and hypercholesterolemia. She seems to have had substantial improvement with Ranexa. Her most recent nuclear stress test performed in November 2016 was normal.  2007 mid LAD 2.2512 mini vision, left circumflex 2.518 Cypher, RCA 3.023 Cypher 2010 mid LAD 2.2512 Taxus overlapping 2.012 mini vision, overlapping another 2.012 mini vision, left circumflex 2.516 Taxus upstream of previous stent  She presents today with worsening functional status. For the last 2 weeks, she has had difficulty performing her usual chores. Last year she could know her entire lawn with a push mower and about 55-60 minutes.  Now she has to stop and rest 5 times before she can complete the task and it takes her almost all day long. She has noticed that she becomes dizzy with quick changes in position. She becomes easily short of breath if she has to climb stairs. Following exertion she occasionally feels chest pressure but this is not a prominent complaint. Her symptoms began roughly 2 weeks after she was battling an upper respiratory infection. She still has some nasal congestion. She has noticed  occasional wheezing. She does not have orthopnea or PND. She has never had problems with allergies. She has not noticed any weight gain or leg edema.  She had a normal nuclear stress test in November 2016.EF was 56%. She had a "false positive" ECG response.she has not had an echocardiogram since 2012. That time she only had aortic valve sclerosis without stenosis.   Past Medical History:  Diagnosis Date  . Asthma   . Coronary artery disease   . Depression   . Diabetes mellitus without complication ([place])    type 2  . History of nuclear stress test 05/21/2010   dipyridamole; normal perfusion, preserved LV systolic EF of 99%  . Hyperlipidemia   . Hypertension   . Kidney stones   . Liver failure ([place])    h/o autoimmune hepatitis   . Obesity   . Psoriasis     Past Surgical History:  Procedure Laterality Date  . Carotid Doppler  10/2008   R & L ICAs 0-49% diameter reduction   . CORONARY ANGIOPLASTY WITH STENT PLACEMENT  09/2008   2.25x19mm Cypher DES to in-stent restenosis of prox LAD; Mini-Vision stent x2 2.0x1mm to area distal of initial LAD stent; 3 2.5x52mm Taxus stents prox to Cypher stent in circumflex  . CORONARY ANGIOPLASTY WITH STENT PLACEMENT  09/2005   Cypher DES 3.0x102mm to distal RCA; 2.25x9mm Mini0Vision stent to mid LAD; 2.5x17mm Cypher stent to circumflex  . TRANSTHORACIC ECHOCARDIOGRAM  04/2010   EF=>55%, borderline conc LVH; trace MR & TR; AV mildly sclerotic; aortic root sclerosis/calcif  .  ureteral stents      Outpatient Medications Prior to Visit  Medication Sig Dispense Refill  . aspirin 81 MG tablet Take 81 mg by mouth daily.    Marland Kitchen co-enzyme Q-10 50 MG capsule Take 50 mg by mouth daily.      . Cranberry 500 MG CAPS Take by mouth.    . ezetimibe (ZETIA) 10 MG tablet Take 1 tablet (10 mg total) by mouth daily. 90 tablet 3  . fenofibrate (TRICOR) 145 MG tablet TAKE 1 TABLET BY MOUTH EVERY DAY 30 tablet 6  . Insulin Pen Needle (B-D ULTRAFINE III SHORT PEN) 31G X 8  MM MISC USE AS DIRECTED E11.9 (Patient taking differently: USE AS DIRECTED E11.9 (NANO)) 100 each 3  . isosorbide mononitrate (IMDUR) 30 MG 24 hr tablet Take 1 tablet (30 mg total) by mouth daily. KEEP OV. 30 tablet 2  . Krill Oil 300 MG CAPS Take by mouth.    . metFORMIN (GLUCOPHAGE) 500 MG tablet Take 1 tablet (500 mg total) by mouth 2 (two) times daily with a meal. 180 tablet 3  . metoprolol succinate (TOPROL-XL) 25 MG 24 hr tablet Take 1.5 tablets (37.5 mg total) by mouth daily. PLEASE CONTACT OFFICE FOR ADDITIONAL REFILLS 135 tablet 3  . Multiple Vitamin (MULTIVITAMIN WITH MINERALS) TABS Take 1 tablet by mouth daily.    Marland Kitchen NITROSTAT 0.4 MG SL tablet DISSOLVE 1 TAB UNDER THE TONGUE EVERY 5 MIN AS NEEDED FOR CHEST PAIN (MAX OF 3) 25 tablet 1  . Omega-3 Fatty Acids (FISH OIL) 1200 MG CAPS Take 3,600 mg by mouth daily.    . ranolazine (RANEXA) 1000 MG SR tablet Take 1 tablet (1,000 mg total) by mouth 2 (two) times daily. 60 tablet 2  . rosuvastatin (CRESTOR) 20 MG tablet Take 1 tablet (20 mg total) by mouth daily. 90 tablet 3  . valsartan (DIOVAN) 160 MG tablet Take 1 tablet (160 mg total) by mouth daily. 90 tablet 3  . vitamin C (ASCORBIC ACID) 500 MG tablet Take 500 mg by mouth daily.     . fenofibrate (TRICOR) 145 MG tablet Take 1 tablet (145 mg total) by mouth daily. (Patient not taking: Reported on 09/14/2016) 90 tablet 3  . Insulin Detemir (LEVEMIR) 100 UNIT/ML Pen 10 units daily (Patient not taking: Reported on 09/14/2016) 20 mL 1  . sulfamethoxazole-trimethoprim (BACTRIM DS,SEPTRA DS) 800-160 MG tablet Take 1 tablet by mouth 2 (two) times daily. (Patient not taking: Reported on 09/14/2016) 20 tablet 0   No facility-administered medications prior to visit.      Allergies:   Penicillins   Social History   Social History  . Marital status: Single    Spouse name: N/A  . Number of children: 1  . Years of education: N/A   Occupational History  .  [place]  History Main Topics  . Smoking status: Never Smoker  . Smokeless tobacco: Never Used  . Alcohol use No  . Drug use: No  . Sexual activity: Not Currently   Other Topics Concern  . Not on file   Social History Narrative  . No narrative on file     ROS:   Please see the history of present illness.    ROS All other systems reviewed and are negative.   PHYSICAL EXAM:   VS:  BP (!) 142/77 (BP Location: Right Arm, Patient Position: Sitting, Cuff Size: Normal)   Pulse 89   Ht 5\' 5"  (1.651 m)  Wt 211 lb 6.4 oz (95.9 kg)   BMI 35.18 kg/m    GEN: Well nourished, well developed, in no acute distress  HEENT: normal  Neck: no JVD, carotid bruits, or masses Cardiac: RRR; early peaking grade 3/6 aortic ejection murmur radiating to both carotids, no diastolic murmurs and no rubs or gallops, no edema  Respiratory:  clear to auscultation bilaterally, normal work of breathing GI: soft, nontender, nondistended, + BS MS: no deformity or atrophy  Skin: warm and dry, no rash Neuro:  Alert and Oriented x 3, Strength and sensation are intact Psych: euthymic mood, full affect  Wt Readings from Last 3 Encounters:  09/14/16 211 lb 6.4 oz (95.9 kg)  07/12/16 214 lb (97.1 kg)  07/16/15 208 lb (94.3 kg)      Studies/Labs Reviewed:   EKG:  EKG is ordered today.  The ekg ordered today demonstrates NSR, QTC 430 ms. Here are no repolarization abnormalities.  Recent Labs: 07/12/2016: ALT 20; BUN 18; Creat 1.04; Hemoglobin 12.4; Platelets 269; Potassium 4.4; Sodium 141   Lipid Panel    Component Value Date/Time   CHOL 175 07/12/2016 1413   TRIG 249 (H) 07/12/2016 1413   HDL 34 (L) 07/12/2016 1413   CHOLHDL 5.1 (H) 07/12/2016 1413   VLDL 50 (H) 07/12/2016 1413   LDLCALC 91 07/12/2016 1413   LDLDIRECT 93 11/15/2012 0829       ASSESSMENT:    1. Shortness of breath   2. Nonrheumatic aortic valve stenosis   3. Mixed hyperlipidemia   4. Hypertension associated with diabetes ([place])   5.  Diabetes mellitus with complication ([facility])   6. Obesity (BMI 30-39.9)      PLAN:  In order of problems listed above:  1. Dyspnea/CHF: she may be describing symptoms of acute on chronic heart failure, but does not have any overt physical findings to suggest hypervolemia and has not had a change in weight.in fact was 3 pounds less than at her previous appointment.  Need to recheck her echocardiogram. If there is evidence of regional wall motion abnormalities she should undergo coronary angiography. 2. AS: her aortic valve murmur sounds fairly prominent. Recheck echo. Her symptoms are compatible with aortic stenosis, but therelatively rapid onset would be atypical. 3. HLP: at baseline she has severe mixed hyperlipidemia, but on the current 3 drug combination she has satisfactory  LDL level. She still has a rather low HDL and mildly elevated triglycerides.these have been present even when she had good glycemic control. 4. HTN: control is fair. Systolic blood pressure is usually lower than what we have recorded today. No changes in medications today. 5. DM: she reports good glucose control on current medications.    Medication Adjustments/Labs and Tests Ordered: Current medicines are reviewed at length with the patient today.  Concerns regarding medicines are outlined above.  Medication changes, Labs and Tests ordered today are listed in the Patient Instructions below. Patient Instructions  Dr Sallyanne Kuster recommends that you continue on your current medications as directed. Please refer to the Current Medication list given to you today.  Your physician has requested that you have an echocardiogram. Echocardiography is a painless test that uses sound waves to create images of your heart. It provides your doctor with information about the size and shape of your heart and how well your heart's chambers and valves are working. This procedure takes approximately one hour. There are no restrictions for this  procedure. This will be performed at our [facility] location - [phone]  [address], [address].  Dr Sallyanne Kuster recommends that you schedule a follow-up appointment first available.  If you need a refill on your cardiac medications before your next appointment, please call your pharmacy.      Signed, Sanda Klein, MD  09/14/2016 5:40 PM    [facility] [address], [place], [place]  [address] Phone: [phone]; Fax: [phone]

## 2016-09-26 ENCOUNTER — Other Ambulatory Visit: Payer: Self-pay

## 2016-09-26 ENCOUNTER — Ambulatory Visit (HOSPITAL_COMMUNITY): Payer: BC Managed Care – PPO | Attending: Cardiovascular Disease

## 2016-09-26 DIAGNOSIS — Z6835 Body mass index (BMI) 35.0-35.9, adult: Secondary | ICD-10-CM | POA: Insufficient documentation

## 2016-09-26 DIAGNOSIS — J45909 Unspecified asthma, uncomplicated: Secondary | ICD-10-CM | POA: Diagnosis not present

## 2016-09-26 DIAGNOSIS — I350 Nonrheumatic aortic (valve) stenosis: Secondary | ICD-10-CM

## 2016-09-26 DIAGNOSIS — I10 Essential (primary) hypertension: Secondary | ICD-10-CM | POA: Diagnosis not present

## 2016-09-26 DIAGNOSIS — R0602 Shortness of breath: Secondary | ICD-10-CM

## 2016-09-26 DIAGNOSIS — E669 Obesity, unspecified: Secondary | ICD-10-CM | POA: Diagnosis not present

## 2016-09-26 DIAGNOSIS — E785 Hyperlipidemia, unspecified: Secondary | ICD-10-CM | POA: Insufficient documentation

## 2016-09-26 DIAGNOSIS — E119 Type 2 diabetes mellitus without complications: Secondary | ICD-10-CM | POA: Insufficient documentation

## 2016-09-26 DIAGNOSIS — I2510 Atherosclerotic heart disease of native coronary artery without angina pectoris: Secondary | ICD-10-CM | POA: Insufficient documentation

## 2016-10-13 ENCOUNTER — Other Ambulatory Visit: Payer: Self-pay | Admitting: Neurosurgery

## 2016-10-17 ENCOUNTER — Encounter (HOSPITAL_COMMUNITY): Payer: Self-pay | Admitting: *Deleted

## 2016-10-17 NOTE — Progress Notes (Signed)
I instructed patient to check CBG after awaking and every 2 hours until arrival  to the hospital.  I Instructed patient if CBG is less than 70 to take 4 Glucose Tablets or 1 tube of Glucose Gel or 1/2 cup of a clear juice. Recheck CBG in 15 minutes then call pre- op desk at [phone] for further instructions. If scheduled to receive Insulin, do not take Insulin

## 2016-10-18 ENCOUNTER — Ambulatory Visit (HOSPITAL_COMMUNITY)
Admission: RE | Admit: 2016-10-18 | Discharge: 2016-10-19 | Disposition: A | Discharge status: Home / Self Care | Payer: BC Managed Care – PPO | Source: Ambulatory Visit | Attending: Neurosurgery | Admitting: Neurosurgery

## 2016-10-18 ENCOUNTER — Ambulatory Visit (HOSPITAL_COMMUNITY): Payer: BC Managed Care – PPO | Admitting: Certified Registered Nurse Anesthetist

## 2016-10-18 ENCOUNTER — Encounter (HOSPITAL_COMMUNITY)
Admission: RE | Disposition: A | Discharge status: Home / Self Care | Payer: Self-pay | Source: Ambulatory Visit | Attending: Neurosurgery

## 2016-10-18 ENCOUNTER — Encounter (HOSPITAL_COMMUNITY): Payer: Self-pay | Admitting: *Deleted

## 2016-10-18 ENCOUNTER — Ambulatory Visit (HOSPITAL_COMMUNITY): Payer: BC Managed Care – PPO

## 2016-10-18 DIAGNOSIS — J45909 Unspecified asthma, uncomplicated: Secondary | ICD-10-CM | POA: Insufficient documentation

## 2016-10-18 DIAGNOSIS — I10 Essential (primary) hypertension: Secondary | ICD-10-CM | POA: Diagnosis not present

## 2016-10-18 DIAGNOSIS — E119 Type 2 diabetes mellitus without complications: Secondary | ICD-10-CM | POA: Insufficient documentation

## 2016-10-18 DIAGNOSIS — Z794 Long term (current) use of insulin: Secondary | ICD-10-CM | POA: Insufficient documentation

## 2016-10-18 DIAGNOSIS — Z85828 Personal history of other malignant neoplasm of skin: Secondary | ICD-10-CM | POA: Insufficient documentation

## 2016-10-18 DIAGNOSIS — Z880 Allergy status to penicillin: Secondary | ICD-10-CM | POA: Insufficient documentation

## 2016-10-18 DIAGNOSIS — M5116 Intervertebral disc disorders with radiculopathy, lumbar region: Secondary | ICD-10-CM | POA: Diagnosis not present

## 2016-10-18 DIAGNOSIS — M48061 Spinal stenosis, lumbar region without neurogenic claudication: Secondary | ICD-10-CM | POA: Diagnosis not present

## 2016-10-18 DIAGNOSIS — I252 Old myocardial infarction: Secondary | ICD-10-CM | POA: Diagnosis not present

## 2016-10-18 DIAGNOSIS — Z7982 Long term (current) use of aspirin: Secondary | ICD-10-CM | POA: Diagnosis not present

## 2016-10-18 DIAGNOSIS — M5126 Other intervertebral disc displacement, lumbar region: Secondary | ICD-10-CM | POA: Diagnosis present

## 2016-10-18 DIAGNOSIS — I2510 Atherosclerotic heart disease of native coronary artery without angina pectoris: Secondary | ICD-10-CM | POA: Insufficient documentation

## 2016-10-18 DIAGNOSIS — E785 Hyperlipidemia, unspecified: Secondary | ICD-10-CM | POA: Insufficient documentation

## 2016-10-18 DIAGNOSIS — Z419 Encounter for procedure for purposes other than remedying health state, unspecified: Secondary | ICD-10-CM

## 2016-10-18 HISTORY — DX: Malignant (primary) neoplasm, unspecified: C80.1

## 2016-10-18 HISTORY — DX: Acute myocardial infarction, unspecified: I21.9

## 2016-10-18 HISTORY — DX: Nausea with vomiting, unspecified: R11.2

## 2016-10-18 HISTORY — DX: Inflammatory liver disease, unspecified: K75.9

## 2016-10-18 HISTORY — DX: Polyneuropathy, unspecified: G62.9

## 2016-10-18 HISTORY — DX: Other specified postprocedural states: R11.2

## 2016-10-18 HISTORY — DX: Personal history of urinary calculi: Z87.442

## 2016-10-18 HISTORY — DX: Other complications of anesthesia, initial encounter: T88.59XA

## 2016-10-18 HISTORY — DX: Adverse effect of unspecified anesthetic, initial encounter: T41.45XA

## 2016-10-18 HISTORY — PX: LUMBAR LAMINECTOMY/DECOMPRESSION MICRODISCECTOMY: SHX5026

## 2016-10-18 HISTORY — DX: Cardiac murmur, unspecified: R01.1

## 2016-10-18 HISTORY — DX: Other specified postprocedural states: Z98.890

## 2016-10-18 LAB — GLUCOSE, CAPILLARY
GLUCOSE-CAPILLARY: 186 mg/dL — AB (ref 65–99)
GLUCOSE-CAPILLARY: 383 mg/dL — AB (ref 65–99)
Glucose-Capillary: 247 mg/dL — ABNORMAL HIGH (ref 65–99)
Glucose-Capillary: 184 mg/dL — ABNORMAL HIGH (ref 65–99)

## 2016-10-18 LAB — COMPREHENSIVE METABOLIC PANEL
ALBUMIN: 3.9 g/dL (ref 3.5–5.0)
ALT: 24 U/L (ref 14–54)
AST: 31 U/L (ref 15–41)
Alkaline Phosphatase: 44 U/L (ref 38–126)
Anion gap: 9 (ref 5–15)
BUN: 14 mg/dL (ref 6–20)
CHLORIDE: 101 mmol/L (ref 101–111)
CO2: 25 mmol/L (ref 22–32)
Calcium: 9.8 mg/dL (ref 8.9–10.3)
Creatinine, Ser: 0.99 mg/dL (ref 0.44–1.00)
GFR calc Af Amer: >60 mL/min (ref >60)
GFR calc non Af Amer: >60 mL/min (ref >60)
GLUCOSE: 250 mg/dL — AB (ref 65–99)
POTASSIUM: 4.4 mmol/L (ref 3.5–5.1)
Sodium: 135 mmol/L (ref 135–145)
Total Bilirubin: 0.7 mg/dL (ref 0.3–1.2)
Total Protein: 7.2 g/dL (ref 6.5–8.1)

## 2016-10-18 LAB — CBC
HEMATOCRIT: 41.9 % (ref 36.0–46.0)
HEMOGLOBIN: 13.6 g/dL (ref 12.0–15.0)
MCH: 29.1 pg (ref 26.0–34.0)
MCHC: 32.5 g/dL (ref 30.0–36.0)
MCV: 89.5 fL (ref 78.0–100.0)
Platelets: 256 10*3/uL (ref 150–400)
RBC: 4.68 MIL/uL (ref 3.87–5.11)
RDW: 13 % (ref 11.5–15.5)
WBC: 4.7 10*3/uL (ref 4.0–10.5)

## 2016-10-18 LAB — SURGICAL PCR SCREEN
MRSA, PCR: NEGATIVE
STAPHYLOCOCCUS AUREUS: NEGATIVE

## 2016-10-18 SURGERY — LUMBAR LAMINECTOMY/DECOMPRESSION MICRODISCECTOMY 1 LEVEL
Anesthesia: General | Site: Back | Laterality: Left

## 2016-10-18 MED ORDER — PROMETHAZINE HCL 25 MG/ML IJ SOLN
6.2500 mg | INTRAMUSCULAR | Status: DC | PRN
  Administered 2016-10-18: 6.25 mg via INTRAVENOUS

## 2016-10-18 MED ORDER — PROPOFOL 10 MG/ML IV BOLUS
INTRAVENOUS | Status: AC
  Filled 2016-10-18: qty 20

## 2016-10-18 MED ORDER — KCL IN DEXTROSE-NACL 20-5-0.45 MEQ/L-%-% IV SOLN: INTRAVENOUS | Status: DC

## 2016-10-18 MED ORDER — THROMBIN 5000 UNITS EX SOLR
CUTANEOUS | Status: AC
  Filled 2016-10-18: qty 10000

## 2016-10-18 MED ORDER — LACTATED RINGERS IV SOLN: INTRAVENOUS | Status: DC

## 2016-10-18 MED ORDER — INSULIN ASPART 100 UNIT/ML ~~LOC~~ SOLN
0.0000 [IU] | Freq: Every day | SUBCUTANEOUS | Status: DC
  Administered 2016-10-19: 5 [IU] via SUBCUTANEOUS

## 2016-10-18 MED ORDER — PHENYLEPHRINE HCL 10 MG/ML IJ SOLN
INTRAVENOUS | Status: DC | PRN
  Administered 2016-10-18: 40 ug/min via INTRAVENOUS

## 2016-10-18 MED ORDER — 0.9 % SODIUM CHLORIDE (POUR BTL) OPTIME
TOPICAL | Status: DC | PRN
  Administered 2016-10-18: 1000 mL

## 2016-10-18 MED ORDER — ONDANSETRON HCL 4 MG/2ML IJ SOLN
INTRAMUSCULAR | Status: DC | PRN
  Administered 2016-10-18: 4 mg via INTRAVENOUS

## 2016-10-18 MED ORDER — INSULIN DETEMIR 100 UNIT/ML ~~LOC~~ SOLN
10.0000 [IU] | Freq: Every day | SUBCUTANEOUS | Status: DC
  Filled 2016-10-18: qty 0.1

## 2016-10-18 MED ORDER — MEPERIDINE HCL 25 MG/ML IJ SOLN: 6.2500 mg | INTRAMUSCULAR | Status: DC | PRN

## 2016-10-18 MED ORDER — FENTANYL CITRATE (PF) 100 MCG/2ML IJ SOLN
INTRAMUSCULAR | Status: DC | PRN
Start: 2016-10-18 — End: 2016-10-18
  Administered 2016-10-18: 25 ug via INTRAVENOUS
  Administered 2016-10-18: 50 ug via INTRAVENOUS
  Administered 2016-10-18: 75 ug via INTRAVENOUS

## 2016-10-18 MED ORDER — SUCCINYLCHOLINE CHLORIDE 200 MG/10ML IV SOSY
PREFILLED_SYRINGE | INTRAVENOUS | Status: AC
  Filled 2016-10-18: qty 10

## 2016-10-18 MED ORDER — ZOLPIDEM TARTRATE 5 MG PO TABS: 5.0000 mg | ORAL_TABLET | Freq: Every evening | ORAL | Status: DC | PRN

## 2016-10-18 MED ORDER — MORPHINE SULFATE (PF) 2 MG/ML IV SOLN: 2.0000 mg | INTRAVENOUS | Status: DC | PRN

## 2016-10-18 MED ORDER — CRANBERRY 300 MG PO TABS: 300.0000 mg | ORAL_TABLET | Freq: Every evening | ORAL | Status: DC

## 2016-10-18 MED ORDER — GABAPENTIN 300 MG PO CAPS
300.0000 mg | ORAL_CAPSULE | Freq: Three times a day (TID) | ORAL | Status: DC
  Administered 2016-10-18: 300 mg via ORAL
  Filled 2016-10-18 (×2): qty 1

## 2016-10-18 MED ORDER — MUPIROCIN 2 % EX OINT
TOPICAL_OINTMENT | CUTANEOUS | Status: AC
  Filled 2016-10-18: qty 22

## 2016-10-18 MED ORDER — SUGAMMADEX SODIUM 200 MG/2ML IV SOLN
INTRAVENOUS | Status: DC | PRN
  Administered 2016-10-18: 200 mg via INTRAVENOUS

## 2016-10-18 MED ORDER — CHLORHEXIDINE GLUCONATE CLOTH 2 % EX PADS: 6.0000 | MEDICATED_PAD | Freq: Once | CUTANEOUS | Status: DC

## 2016-10-18 MED ORDER — HYDROMORPHONE HCL 1 MG/ML IJ SOLN
INTRAMUSCULAR | Status: AC
  Filled 2016-10-18: qty 0.5

## 2016-10-18 MED ORDER — ISOSORBIDE MONONITRATE ER 30 MG PO TB24
30.0000 mg | ORAL_TABLET | Freq: Every day | ORAL | Status: DC
  Filled 2016-10-18: qty 1

## 2016-10-18 MED ORDER — PANTOPRAZOLE SODIUM 40 MG IV SOLR
40.0000 mg | Freq: Every day | INTRAVENOUS | Status: DC
  Administered 2016-10-18: 40 mg via INTRAVENOUS
  Filled 2016-10-18: qty 40

## 2016-10-18 MED ORDER — FENTANYL CITRATE (PF) 250 MCG/5ML IJ SOLN
INTRAMUSCULAR | Status: AC
  Filled 2016-10-18: qty 5

## 2016-10-18 MED ORDER — HYDROMORPHONE HCL 1 MG/ML IJ SOLN
0.2500 mg | INTRAMUSCULAR | Status: DC | PRN
  Administered 2016-10-18: 0.5 mg via INTRAVENOUS

## 2016-10-18 MED ORDER — EPHEDRINE 5 MG/ML INJ
INTRAVENOUS | Status: AC
  Filled 2016-10-18: qty 10

## 2016-10-18 MED ORDER — ALUM & MAG HYDROXIDE-SIMETH 200-200-20 MG/5ML PO SUSP: 30.0000 mL | Freq: Four times a day (QID) | ORAL | Status: DC | PRN

## 2016-10-18 MED ORDER — ONDANSETRON HCL 4 MG/2ML IJ SOLN
INTRAMUSCULAR | Status: AC
  Filled 2016-10-18: qty 2

## 2016-10-18 MED ORDER — MIDAZOLAM HCL 5 MG/5ML IJ SOLN
INTRAMUSCULAR | Status: DC | PRN
  Administered 2016-10-18: 2 mg via INTRAVENOUS

## 2016-10-18 MED ORDER — ROSUVASTATIN CALCIUM 20 MG PO TABS
20.0000 mg | ORAL_TABLET | Freq: Every day | ORAL | Status: DC
  Administered 2016-10-18: 20 mg via ORAL
  Filled 2016-10-18: qty 1

## 2016-10-18 MED ORDER — MIDAZOLAM HCL 2 MG/2ML IJ SOLN
INTRAMUSCULAR | Status: AC
  Filled 2016-10-18: qty 2

## 2016-10-18 MED ORDER — BUPIVACAINE HCL (PF) 0.5 % IJ SOLN
INTRAMUSCULAR | Status: AC
  Filled 2016-10-18: qty 30

## 2016-10-18 MED ORDER — RANOLAZINE ER 500 MG PO TB12
1000.0000 mg | ORAL_TABLET | Freq: Two times a day (BID) | ORAL | Status: DC
  Administered 2016-10-18: 1000 mg via ORAL
  Filled 2016-10-18 (×2): qty 2

## 2016-10-18 MED ORDER — DOCUSATE SODIUM 100 MG PO CAPS
100.0000 mg | ORAL_CAPSULE | Freq: Two times a day (BID) | ORAL | Status: DC
  Administered 2016-10-18: 100 mg via ORAL
  Filled 2016-10-18: qty 1

## 2016-10-18 MED ORDER — INSULIN ASPART 100 UNIT/ML ~~LOC~~ SOLN
0.0000 [IU] | Freq: Three times a day (TID) | SUBCUTANEOUS | Status: DC
Start: 2016-10-19 — End: 2016-10-18

## 2016-10-18 MED ORDER — MUPIROCIN 2 % EX OINT: 1.0000 "application " | TOPICAL_OINTMENT | Freq: Once | CUTANEOUS | Status: DC

## 2016-10-18 MED ORDER — ONDANSETRON HCL 4 MG PO TABS: 4.0000 mg | ORAL_TABLET | Freq: Four times a day (QID) | ORAL | Status: DC | PRN

## 2016-10-18 MED ORDER — SODIUM CHLORIDE 0.9 % IV SOLN: 250.0000 mL | INTRAVENOUS | Status: DC

## 2016-10-18 MED ORDER — THROMBIN 5000 UNITS EX SOLR
CUTANEOUS | Status: AC
  Filled 2016-10-18: qty 5000

## 2016-10-18 MED ORDER — METOPROLOL SUCCINATE ER 25 MG PO TB24
37.5000 mg | ORAL_TABLET | Freq: Every day | ORAL | Status: DC
  Filled 2016-10-18: qty 1

## 2016-10-18 MED ORDER — MORPHINE SULFATE (PF) 4 MG/ML IV SOLN: 2.0000 mg | INTRAVENOUS | Status: DC | PRN

## 2016-10-18 MED ORDER — NITROGLYCERIN 0.4 MG SL SUBL: 0.4000 mg | SUBLINGUAL_TABLET | SUBLINGUAL | Status: DC | PRN

## 2016-10-18 MED ORDER — METHOCARBAMOL 500 MG PO TABS
500.0000 mg | ORAL_TABLET | Freq: Four times a day (QID) | ORAL | Status: DC | PRN
  Filled 2016-10-18: qty 1

## 2016-10-18 MED ORDER — LACTATED RINGERS IV SOLN
INTRAVENOUS | Status: DC
  Administered 2016-10-18 (×2): via INTRAVENOUS

## 2016-10-18 MED ORDER — FENOFIBRATE 160 MG PO TABS
160.0000 mg | ORAL_TABLET | Freq: Every day | ORAL | Status: DC
  Administered 2016-10-18: 160 mg via ORAL
  Filled 2016-10-18: qty 1

## 2016-10-18 MED ORDER — LIDOCAINE 2% (20 MG/ML) 5 ML SYRINGE
INTRAMUSCULAR | Status: AC
  Filled 2016-10-18: qty 10

## 2016-10-18 MED ORDER — INSULIN ASPART 100 UNIT/ML ~~LOC~~ SOLN
0.0000 [IU] | Freq: Three times a day (TID) | SUBCUTANEOUS | Status: DC
  Administered 2016-10-19: 11 [IU] via SUBCUTANEOUS

## 2016-10-18 MED ORDER — PHENOL 1.4 % MT LIQD: 1.0000 | OROMUCOSAL | Status: DC | PRN

## 2016-10-18 MED ORDER — METHOCARBAMOL 1000 MG/10ML IJ SOLN
500.0000 mg | Freq: Four times a day (QID) | INTRAVENOUS | Status: DC | PRN
  Filled 2016-10-18: qty 5

## 2016-10-18 MED ORDER — ACETAMINOPHEN 650 MG RE SUPP: 650.0000 mg | RECTAL | Status: DC | PRN

## 2016-10-18 MED ORDER — ROCURONIUM BROMIDE 100 MG/10ML IV SOLN
INTRAVENOUS | Status: DC | PRN
  Administered 2016-10-18: 50 mg via INTRAVENOUS

## 2016-10-18 MED ORDER — HYDROCODONE-ACETAMINOPHEN 5-325 MG PO TABS: 1.0000 | ORAL_TABLET | Freq: Four times a day (QID) | ORAL | Status: DC | PRN

## 2016-10-18 MED ORDER — SODIUM CHLORIDE 0.9% FLUSH
3.0000 mL | Freq: Two times a day (BID) | INTRAVENOUS | Status: DC
  Administered 2016-10-18: 3 mL via INTRAVENOUS

## 2016-10-18 MED ORDER — METFORMIN HCL 500 MG PO TABS
500.0000 mg | ORAL_TABLET | Freq: Two times a day (BID) | ORAL | Status: DC
  Administered 2016-10-18 – 2016-10-19 (×2): 500 mg via ORAL
  Filled 2016-10-18 (×2): qty 1

## 2016-10-18 MED ORDER — LIDOCAINE-EPINEPHRINE 1 %-1:100000 IJ SOLN
INTRAMUSCULAR | Status: AC
  Filled 2016-10-18: qty 1

## 2016-10-18 MED ORDER — THROMBIN 5000 UNITS EX SOLR
CUTANEOUS | Status: DC | PRN
  Administered 2016-10-18: 10000 [IU] via TOPICAL

## 2016-10-18 MED ORDER — EZETIMIBE 10 MG PO TABS
10.0000 mg | ORAL_TABLET | Freq: Every day | ORAL | Status: DC
  Filled 2016-10-18: qty 1

## 2016-10-18 MED ORDER — IRBESARTAN 75 MG PO TABS
75.0000 mg | ORAL_TABLET | Freq: Every day | ORAL | Status: DC
  Filled 2016-10-18 (×2): qty 1

## 2016-10-18 MED ORDER — SENNOSIDES-DOCUSATE SODIUM 8.6-50 MG PO TABS
1.0000 | ORAL_TABLET | Freq: Every evening | ORAL | Status: DC | PRN
Start: 2016-10-18 — End: 2016-10-19

## 2016-10-18 MED ORDER — PROPOFOL 10 MG/ML IV BOLUS
INTRAVENOUS | Status: DC | PRN
  Administered 2016-10-18: 140 mg via INTRAVENOUS

## 2016-10-18 MED ORDER — OXYCODONE-ACETAMINOPHEN 5-325 MG PO TABS
1.0000 | ORAL_TABLET | ORAL | Status: DC | PRN
  Administered 2016-10-18 – 2016-10-19 (×4): 2 via ORAL
  Filled 2016-10-18 (×4): qty 2

## 2016-10-18 MED ORDER — BUPIVACAINE HCL (PF) 0.5 % IJ SOLN
INTRAMUSCULAR | Status: DC | PRN
  Administered 2016-10-18: 10 mL

## 2016-10-18 MED ORDER — HYDROMORPHONE HCL 1 MG/ML IJ SOLN: 0.2500 mg | INTRAMUSCULAR | Status: DC | PRN

## 2016-10-18 MED ORDER — PROMETHAZINE HCL 25 MG/ML IJ SOLN: 6.2500 mg | INTRAMUSCULAR | Status: DC | PRN

## 2016-10-18 MED ORDER — ONDANSETRON HCL 4 MG/2ML IJ SOLN: 4.0000 mg | Freq: Four times a day (QID) | INTRAMUSCULAR | Status: DC | PRN

## 2016-10-18 MED ORDER — VANCOMYCIN HCL IN DEXTROSE 1-5 GM/200ML-% IV SOLN
1000.0000 mg | Freq: Once | INTRAVENOUS | Status: AC
  Administered 2016-10-19: 1000 mg via INTRAVENOUS
  Filled 2016-10-18: qty 200

## 2016-10-18 MED ORDER — FENTANYL CITRATE (PF) 100 MCG/2ML IJ SOLN
INTRAMUSCULAR | Status: AC
  Filled 2016-10-18: qty 2

## 2016-10-18 MED ORDER — EPHEDRINE SULFATE 50 MG/ML IJ SOLN
INTRAMUSCULAR | Status: DC | PRN
  Administered 2016-10-18: 15 mg via INTRAVENOUS

## 2016-10-18 MED ORDER — SODIUM CHLORIDE 0.9% FLUSH: 3.0000 mL | INTRAVENOUS | Status: DC | PRN

## 2016-10-18 MED ORDER — COENZYME Q10 300 MG PO CAPS: 300.0000 mg | ORAL_CAPSULE | Freq: Every day | ORAL | Status: DC

## 2016-10-18 MED ORDER — SUCCINYLCHOLINE CHLORIDE 200 MG/10ML IV SOSY
PREFILLED_SYRINGE | INTRAVENOUS | Status: DC | PRN
  Administered 2016-10-18: 120 mg via INTRAVENOUS

## 2016-10-18 MED ORDER — HYDROCODONE-ACETAMINOPHEN 5-325 MG PO TABS: 1.0000 | ORAL_TABLET | ORAL | Status: DC | PRN

## 2016-10-18 MED ORDER — METHYLPREDNISOLONE ACETATE 80 MG/ML IJ SUSP
INTRAMUSCULAR | Status: AC
  Filled 2016-10-18: qty 1

## 2016-10-18 MED ORDER — METHYLPREDNISOLONE ACETATE 80 MG/ML IJ SUSP
INTRAMUSCULAR | Status: DC | PRN
  Administered 2016-10-18: 80 mg

## 2016-10-18 MED ORDER — ASPIRIN EC 81 MG PO TBEC
81.0000 mg | DELAYED_RELEASE_TABLET | Freq: Every day | ORAL | Status: DC
  Filled 2016-10-18: qty 1

## 2016-10-18 MED ORDER — LIDOCAINE HCL (CARDIAC) 20 MG/ML IV SOLN
INTRAVENOUS | Status: DC | PRN
  Administered 2016-10-18: 40 mg via INTRAVENOUS

## 2016-10-18 MED ORDER — BISACODYL 10 MG RE SUPP
10.0000 mg | Freq: Every day | RECTAL | Status: DC | PRN
Start: 2016-10-18 — End: 2016-10-19

## 2016-10-18 MED ORDER — FLEET ENEMA 7-19 GM/118ML RE ENEM: 1.0000 | ENEMA | Freq: Once | RECTAL | Status: DC | PRN

## 2016-10-18 MED ORDER — LIDOCAINE-EPINEPHRINE 1 %-1:100000 IJ SOLN
INTRAMUSCULAR | Status: DC | PRN
  Administered 2016-10-18: 10 mL

## 2016-10-18 MED ORDER — KRILL OIL 500 MG PO CAPS: 500.0000 mg | ORAL_CAPSULE | Freq: Every evening | ORAL | Status: DC

## 2016-10-18 MED ORDER — FENTANYL CITRATE (PF) 100 MCG/2ML IJ SOLN
INTRAMUSCULAR | Status: DC | PRN
  Administered 2016-10-18: 100 ug via INTRAVENOUS

## 2016-10-18 MED ORDER — ACETAMINOPHEN 325 MG PO TABS: 650.0000 mg | ORAL_TABLET | ORAL | Status: DC | PRN

## 2016-10-18 MED ORDER — MENTHOL 3 MG MT LOZG: 1.0000 | LOZENGE | OROMUCOSAL | Status: DC | PRN

## 2016-10-18 MED ORDER — PROMETHAZINE HCL 25 MG/ML IJ SOLN
INTRAMUSCULAR | Status: AC
  Filled 2016-10-18: qty 1

## 2016-10-18 MED ORDER — ROCURONIUM BROMIDE 10 MG/ML (PF) SYRINGE
PREFILLED_SYRINGE | INTRAVENOUS | Status: AC
  Filled 2016-10-18: qty 5

## 2016-10-18 MED ORDER — VANCOMYCIN HCL IN DEXTROSE 1-5 GM/200ML-% IV SOLN
1000.0000 mg | INTRAVENOUS | Status: AC
  Administered 2016-10-18: 1000 mg via INTRAVENOUS
  Filled 2016-10-18: qty 200

## 2016-10-18 MED ORDER — ADULT MULTIVITAMIN W/MINERALS CH
1.0000 | ORAL_TABLET | Freq: Every day | ORAL | Status: DC
  Filled 2016-10-18: qty 1

## 2016-10-18 SURGICAL SUPPLY — 58 items
ADH SKN CLS APL DERMABOND .7 (GAUZE/BANDAGES/DRESSINGS) ×1
BIT DRILL NEURO 2X3.1 SFT TUCH (MISCELLANEOUS) ×1 IMPLANT
BLADE CLIPPER SURG (BLADE) IMPLANT
BUR ROUND FLUTED 5 RND (BURR) ×2 IMPLANT
BUR ROUND FLUTED 5MM RND (BURR) ×1
CANISTER SUCT 3000ML PPV (MISCELLANEOUS) ×3 IMPLANT
CARTRIDGE OIL MAESTRO DRILL (MISCELLANEOUS) ×1 IMPLANT
DECANTER SPIKE VIAL GLASS SM (MISCELLANEOUS) ×1 IMPLANT
DERMABOND ADVANCED (GAUZE/BANDAGES/DRESSINGS) ×2
DERMABOND ADVANCED .7 DNX12 (GAUZE/BANDAGES/DRESSINGS) ×1 IMPLANT
DIFFUSER DRILL AIR PNEUMATIC (MISCELLANEOUS) ×3 IMPLANT
DRAPE LAPAROTOMY 100X72X124 (DRAPES) ×3 IMPLANT
DRAPE MICROSCOPE LEICA (MISCELLANEOUS) ×3 IMPLANT
DRAPE POUCH INSTRU U-SHP 10X18 (DRAPES) ×3 IMPLANT
DRAPE SURG 17X23 STRL (DRAPES) ×3 IMPLANT
DRILL NEURO 2X3.1 SOFT TOUCH (MISCELLANEOUS) ×3
DRSG OPSITE POSTOP 3X4 (GAUZE/BANDAGES/DRESSINGS) ×2 IMPLANT
DURAPREP 26ML APPLICATOR (WOUND CARE) ×3 IMPLANT
ELECT REM PT RETURN 9FT ADLT (ELECTROSURGICAL) ×3
ELECTRODE REM PT RTRN 9FT ADLT (ELECTROSURGICAL) ×1 IMPLANT
GAUZE SPONGE 4X4 12PLY STRL (GAUZE/BANDAGES/DRESSINGS) IMPLANT
GAUZE SPONGE 4X4 16PLY XRAY LF (GAUZE/BANDAGES/DRESSINGS) IMPLANT
GLOVE BIO SURGEON STRL SZ8 (GLOVE) ×3 IMPLANT
GLOVE BIOGEL PI IND STRL 8 (GLOVE) ×1 IMPLANT
GLOVE BIOGEL PI IND STRL 8.5 (GLOVE) ×1 IMPLANT
GLOVE BIOGEL PI INDICATOR 8 (GLOVE) ×2
GLOVE BIOGEL PI INDICATOR 8.5 (GLOVE) ×2
GLOVE ECLIPSE 8.0 STRL XLNG CF (GLOVE) ×3 IMPLANT
GLOVE EXAM NITRILE LRG STRL (GLOVE) IMPLANT
GLOVE EXAM NITRILE XL STR (GLOVE) IMPLANT
GLOVE EXAM NITRILE XS STR PU (GLOVE) ×2 IMPLANT
GOWN STRL REUS W/ TWL LRG LVL3 (GOWN DISPOSABLE) IMPLANT
GOWN STRL REUS W/ TWL XL LVL3 (GOWN DISPOSABLE) ×1 IMPLANT
GOWN STRL REUS W/TWL 2XL LVL3 (GOWN DISPOSABLE) ×3 IMPLANT
GOWN STRL REUS W/TWL LRG LVL3 (GOWN DISPOSABLE) ×3
GOWN STRL REUS W/TWL XL LVL3 (GOWN DISPOSABLE) ×6
KIT BASIN OR (CUSTOM PROCEDURE TRAY) ×3 IMPLANT
KIT ROOM TURNOVER OR (KITS) ×3 IMPLANT
NDL HYPO 18GX1.5 BLUNT FILL (NEEDLE) IMPLANT
NDL HYPO 25X1 1.5 SAFETY (NEEDLE) ×1 IMPLANT
NDL SPNL 18GX3.5 QUINCKE PK (NEEDLE) IMPLANT
NEEDLE HYPO 18GX1.5 BLUNT FILL (NEEDLE) ×3 IMPLANT
NEEDLE HYPO 25X1 1.5 SAFETY (NEEDLE) ×3 IMPLANT
NEEDLE SPNL 18GX3.5 QUINCKE PK (NEEDLE) ×3 IMPLANT
NS IRRIG 1000ML POUR BTL (IV SOLUTION) ×3 IMPLANT
OIL CARTRIDGE MAESTRO DRILL (MISCELLANEOUS) ×3
PACK LAMINECTOMY NEURO (CUSTOM PROCEDURE TRAY) ×3 IMPLANT
PAD ARMBOARD 7.5X6 YLW CONV (MISCELLANEOUS) ×9 IMPLANT
RUBBERBAND STERILE (MISCELLANEOUS) ×6 IMPLANT
SPONGE SURGIFOAM ABS GEL SZ50 (HEMOSTASIS) ×3 IMPLANT
SUT VIC AB 0 CT1 18XCR BRD8 (SUTURE) ×1 IMPLANT
SUT VIC AB 0 CT1 8-18 (SUTURE) ×3
SUT VIC AB 2-0 CT1 18 (SUTURE) ×3 IMPLANT
SUT VIC AB 3-0 SH 8-18 (SUTURE) ×3 IMPLANT
SYR 5ML LL (SYRINGE) ×2 IMPLANT
TOWEL GREEN STERILE (TOWEL DISPOSABLE) ×3 IMPLANT
TOWEL GREEN STERILE FF (TOWEL DISPOSABLE) ×3 IMPLANT
WATER STERILE IRR 1000ML POUR (IV SOLUTION) ×3 IMPLANT

## 2016-10-18 NOTE — Anesthesia Postprocedure Evaluation (Signed)
Anesthesia Post Note  Patient: POET HINEMAN  Procedure(s) Performed: Procedure(s) (LRB): Left Lumbar Four-Five Microdiscectomy (Left)     Patient location during evaluation: PACU Anesthesia Type: General Level of consciousness: awake and alert Pain management: pain level controlled Vital Signs Assessment: post-procedure vital signs reviewed and stable Respiratory status: spontaneous breathing, nonlabored ventilation, respiratory function stable and patient connected to nasal cannula oxygen Cardiovascular status: blood pressure returned to baseline and stable Postop Assessment: no signs of nausea or vomiting Anesthetic complications: no    Last Vitals:  Vitals:   10/18/16 1544 10/18/16 1606  BP:  134/78  Pulse:  73  Resp:  18  Temp: 36.1 C 36.7 C    Last Pain:  Vitals:   10/18/16 1734  TempSrc:   PainSc: [place] D Silvia Markuson

## 2016-10-18 NOTE — Interval H&P Note (Signed)
History and Physical Interval Note:  10/18/2016 12:13 PM  Haley George  has presented today for surgery, with the diagnosis of Disc displacement, Lumbar  The various methods of treatment have been discussed with the patient and family. After consideration of risks, benefits and other options for treatment, the patient has consented to  Procedure(s) with comments: Left L4-5 Microdiscectomy (Left) - Left L4-5 Microdiscectomy as a surgical intervention .  The patient's history has been reviewed, patient examined, no change in status, stable for surgery.  I have reviewed the patient's chart and labs.  Questions were answered to the patient's satisfaction.     [name]

## 2016-10-18 NOTE — Brief Op Note (Signed)
10/18/2016  2:21 PM  PATIENT:  Haley George  61 y.o. 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  BLOOD ADMINISTERED:none  DRAINS: none   LOCAL MEDICATIONS USED:  MARCAINE    and LIDOCAINE   SPECIMEN:  No Specimen  DISPOSITION OF SPECIMEN:  N/A  COUNTS:  YES  TOURNIQUET:  * No tourniquets in log *  DICTATION: DICTATION: Patient has a large L 45 disc rupture on the left with significant right leg weakness. It was elected to take her to surgery for left L 45 microdiscectomy.  Procedure: Patient was brought to the operating room and following the smooth and uncomplicated induction of general endotracheal anesthesia she was placed in a prone position on the Wilson frame. Low back was prepped and draped in the usual sterile fashion with betadine scrub and DuraPrep. Preoperative localizing X ray was obtained with a spinal needle.  Area of planned incision was infiltrated with local lidocaine. Incision was made in the midline and carried to the lumbodorsal fascia which was incised on the left side of midline. Subperiosteal dissection was performed exposing what was felt to be L 45 level. Intraoperative x-ray demonstrated marker probe at L 45.  A hemi-semi-laminectomy of L 4 was performed a high-speed drill and completed with Kerrison rongeurs and a generous foraminotomy was performed overlying the superior aspect of the L 5 lamina. Ligamentum flavum was detached and removed in a piecemeal  fashion and the L 4 and L 5  nerve roots were decompressed laterally with removal of the superior aspect of the facet and ligamentum causing nerve root compression. The microscope was brought into the field and the thecal sac and  L 5 nerve root was mobilized medially. This exposed a large amount of soft disc material and a superiorly migrated free fragment of herniated disc material directly compressing the exiting L 4 nerve root. Multiple fragments were removed and these extended out the neural foramen. The interspace was not opened.  At this point it was felt that all neural elements were well decompressed. Hemostasis was assured with bipolar electrocautery, Surgifoam and the wound was bathed with Depo-Medrol and fentanyl. The lumbodorsal fascia was closed with 0 Vicryl sutures the subcutaneous tissues reapproximated 2-0 Vicryl inverted sutures and the skin edges were reapproximated with 3-0 Vicryl subcuticular stitch. The wound is dressed with Dermabond and an occlusive dressing. Patient was extubated in the operating room and taken to recovery in stable and satisfactory condition having tolerated her operation well counts were correct at the end of the case.   PLAN OF CARE: Admit to inpatient   PATIENT DISPOSITION:  PACU - hemodynamically stable.   Delay start of Pharmacological VTE agent (>24hrs) due to surgical blood loss or risk of bleeding: yes

## 2016-10-18 NOTE — Progress Notes (Signed)
Awake, alert, conversant.  Leg pain better. Strength full.  Patient is doing well.

## 2016-10-18 NOTE — Progress Notes (Signed)
Pharmacy Antibiotic Note  Haley George is a 61 y.o. female admitted on 10/18/2016 with surgical prophylaxis.  Pharmacy has been consulted for vancomycin dosing.  Pre-op vancomycin 1g IV given this morning at 1245. Patient does not have a drain in place.  Plan: Vancomycin 1g IV x1 to be given at midnight Pharmacy to sign off as no further doses required  Height: 5\' 5"  (165.1 cm) Weight: 208 lb (94.3 kg) IBW/kg (Calculated) : 57  Temp (24hrs), Avg:97.7 F (36.5 C), Min:97 F (36.1 C), Max:98 F (36.7 C)   Recent Labs Lab 10/18/16 1050  WBC 4.7  CREATININE 0.99    Estimated Creatinine Clearance: 69.4 mL/min (by C-G formula based on SCr of 0.99 mg/dL).    Allergies  Allergen Reactions  . Penicillins Hives     PATIENT HAD A PCN REACTION WITH IMMEDIATE RASH, FACIAL/TONGUE/THROAT SWELLING, SOB, OR LIGHTHEADEDNESS WITH HYPOTENSION:  #  #  #  YES  #  #  #   Has patient had a PCN reaction causing severe rash involving mucus membranes or skin necrosis:Unknown Has patient had a PCN reaction that required hospitalization:No Has patient had a PCN reaction occurring within the last 10 years:No If all of the above answers are "NO", then may proceed with Cephalosporin use.      Thank you for allowing pharmacy to be a part of this patient's care.  Tarl Cephas 10/18/2016 4:26 PM

## 2016-10-18 NOTE — Op Note (Signed)
10/18/2016  2:21 PM  PATIENT:  Haley George  61 y.o. 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  BLOOD ADMINISTERED:none  DRAINS: none   LOCAL MEDICATIONS USED:  MARCAINE    and LIDOCAINE   SPECIMEN:  No Specimen  DISPOSITION OF SPECIMEN:  N/A  COUNTS:  YES  TOURNIQUET:  * No tourniquets in log *  DICTATION: DICTATION: Patient has a large L 45 disc rupture on the left with significant right leg weakness. It was elected to take her to surgery for left L 45 microdiscectomy.  Procedure: Patient was brought to the operating room and following the smooth and uncomplicated induction of general endotracheal anesthesia she was placed in a prone position on the Wilson frame. Low back was prepped and draped in the usual sterile fashion with betadine scrub and DuraPrep. Preoperative localizing X ray was obtained with a spinal needle.  Area of planned incision was infiltrated with local lidocaine. Incision was made in the midline and carried to the lumbodorsal fascia which was incised on the left side of midline. Subperiosteal dissection was performed exposing what was felt to be L 45 level. Intraoperative x-ray demonstrated marker probe at L 45.  A hemi-semi-laminectomy of L 4 was performed a high-speed drill and completed with Kerrison rongeurs and a generous foraminotomy was performed overlying the superior aspect of the L 5 lamina. Ligamentum flavum was detached and removed in a piecemeal  fashion and the L 4 and L 5  nerve roots were decompressed laterally with removal of the superior aspect of the facet and ligamentum causing nerve root compression. The microscope was brought into the field and the thecal sac and  L 5 nerve root was mobilized medially. This exposed a large amount of soft disc material and a superiorly migrated free fragment of herniated disc material directly compressing the exiting L 4 nerve root. Multiple fragments were removed and these extended out the neural foramen. The interspace was not opened.  At this point it was felt that all neural elements were well decompressed. Hemostasis was assured with bipolar electrocautery, Surgifoam and the wound was bathed with Depo-Medrol and fentanyl. The lumbodorsal fascia was closed with 0 Vicryl sutures the subcutaneous tissues reapproximated 2-0 Vicryl inverted sutures and the skin edges were reapproximated with 3-0 Vicryl subcuticular stitch. The wound is dressed with Dermabond and an occlusive dressing. Patient was extubated in the operating room and taken to recovery in stable and satisfactory condition having tolerated her operation well counts were correct at the end of the case.   PLAN OF CARE: Admit to inpatient   PATIENT DISPOSITION:  PACU - hemodynamically stable.   Delay start of Pharmacological VTE agent (>24hrs) due to surgical blood loss or risk of bleeding: yes

## 2016-10-18 NOTE — Anesthesia Procedure Notes (Signed)
Procedure Name: Intubation Date/Time: 10/18/2016 1:03 PM Performed by: Carney Living Pre-anesthesia Checklist: Patient identified, Emergency Drugs available, Suction available, Patient being monitored and Timeout performed Patient Re-evaluated:Patient Re-evaluated prior to inductionOxygen Delivery Method: Circle system utilized Preoxygenation: Pre-oxygenation with 100% oxygen Intubation Type: IV induction Ventilation: Mask ventilation without difficulty Laryngoscope Size: Mac and 4 Grade View: Grade I Tube type: Oral Tube size: 7.0 mm Number of attempts: 1 Airway Equipment and Method: Stylet Placement Confirmation: ETT inserted through vocal cords under direct vision,  positive ETCO2 and breath sounds checked- equal and bilateral Secured at: 22 cm Tube secured with: Tape Dental Injury: Teeth and Oropharynx as per pre-operative assessment

## 2016-10-18 NOTE — Anesthesia Preprocedure Evaluation (Addendum)
Anesthesia Evaluation  Patient identified by MRN, date of birth, ID band Patient awake    Reviewed: Allergy & Precautions, NPO status , Patient's Chart, lab work & pertinent test results  History of Anesthesia Complications (+) PONV and history of anesthetic complications  Airway Mallampati: I  TM Distance: >3 FB Neck ROM: Full    Dental  (+) Teeth Intact, Dental Advisory Given   Pulmonary asthma ,    breath sounds clear to auscultation       Cardiovascular hypertension, Pt. on home beta blockers + CAD, + Past MI and + Cardiac Stents   Rhythm:Regular Rate:Normal     Neuro/Psych PSYCHIATRIC DISORDERS Depression negative neurological ROS     GI/Hepatic negative GI ROS, (+) Hepatitis -  Endo/Other  diabetes, Type 2, Oral Hypoglycemic Agents, Insulin Dependent  Renal/GU negative Renal ROS  negative genitourinary   Musculoskeletal negative musculoskeletal ROS (+)   Abdominal (+) + obese,   Peds negative pediatric ROS (+)  Hematology negative hematology ROS (+)   Anesthesia Other Findings - HLD  Reproductive/Obstetrics negative OB ROS                            Lab Results  Component Value Date   WBC 4.7 10/18/2016   HGB 13.6 10/18/2016   HCT 41.9 10/18/2016   MCV 89.5 10/18/2016   PLT 256 10/18/2016   Lab Results  Component Value Date   CREATININE 0.99 10/18/2016   BUN 14 10/18/2016   NA 135 10/18/2016   K 4.4 10/18/2016   CL 101 10/18/2016   CO2 25 10/18/2016   No results found for: INR, PROTIME  Echo: - Left ventricle: The cavity size was normal. Wall thickness was   increased in a pattern of mild LVH. Systolic function was normal.   The estimated ejection fraction was in the range of 60% to 65%.   Wall motion was normal; there were no regional wall motion   abnormalities. Doppler parameters are consistent with abnormal   left ventricular relaxation (grade 1 diastolic  dysfunction). - Aortic valve: Mildly calcified annulus.  Anesthesia Physical Anesthesia Plan  ASA: III  Anesthesia Plan: General   Post-op Pain Management:    Induction:   PONV Risk Score and Plan: 4 or greater and Ondansetron, Dexamethasone, Propofol, Midazolam and Scopolamine patch - Pre-op  Airway Management Planned: Oral ETT  Additional Equipment:   Intra-op Plan:   Post-operative Plan: Extubation in OR  Informed Consent: I have reviewed the patients History and Physical, chart, labs and discussed the procedure including the risks, benefits and alternatives for the proposed anesthesia with the patient or authorized representative who has indicated his/her understanding and acceptance.   Dental advisory given  Plan Discussed with: CRNA  Anesthesia Plan Comments:         Anesthesia Quick Evaluation

## 2016-10-18 NOTE — Transfer of Care (Signed)
Immediate Anesthesia Transfer of Care Note  Patient: Haley George  Procedure(s) Performed: Procedure(s) with comments: Left Lumbar Four-Five Microdiscectomy (Left) - Left L4-5 Microdiscectomy  Patient Location: PACU  Anesthesia Type:General  Level of Consciousness: awake, alert , oriented and patient cooperative  Airway & Oxygen Therapy: Patient Spontanous Breathing and Patient connected to nasal cannula oxygen  Post-op Assessment: Report given to RN, Post -op Vital signs reviewed and stable and Patient moving all extremities X 4  Post vital signs: Reviewed and stable  Last Vitals:  Vitals:   10/18/16 1048  BP: (!) 160/89  Pulse: 94  Resp: 20  Temp: 36.7 C    Last Pain:  Vitals:   10/18/16 1117  TempSrc:   PainSc: 5       Patients Stated Pain Goal: 6 (65/99/35 7017)  Complications: No apparent anesthesia complications

## 2016-10-18 NOTE — H&P (Signed)
Patient ID:   [id number] Patient: Haley George  Date of Birth: [birth year] Visit Type: Office Visit   Date: 10/13/2016 02:30 PM Provider: Marchia Meiers. Vertell Limber MD   This 61 year old female presents for pain.   History of Present Illness: 1.  pain  Jasiya Markie, 61 year old retired female visits for evaluation of left hip and leg pain.  She recalls no injury, stating she was walking to her bed May 28th, felt a sharp pain in her left hip.  Symptoms have persisted.  ESI's offered no relief Gabapentin 300 milligrams t.i.d. Norco ?mg 2-3/day Robaxin 500 milligrams offered no relief  History:  HTN, MI, IDDM, skin cancer Surgical history:  Left rotator cuff, right rotator cuff 2007, heart stents x2 January 2007 (no longer requiring anticoagulant)  Echocardiogram 2 weeks ago, negative per patient  Imaging on Canopy          PAST MEDICAL HISTORY, SURGICAL HISTORY, FAMILY HISTORY, SOCIAL HISTORY AND REVIEW OF SYSTEMS I have reviewed the patient's past medical, surgical, family and social history as well as the comprehensive review of systems as included on the [facility] history form dated 10/13/2016, which I have signed.   MEDICATIONS(added, continued or stopped this visit):   ALLERGIES:   Review of Systems System Neg/Pos Details  Constitutional Negative Chills, Fatigue, Fever, Malaise, Night sweats, Weight gain and Weight loss.  ENMT Negative Ear drainage, Hearing loss, Nasal drainage, Otalgia, Sinus pressure and Sore throat.  Eyes Negative Eye discharge, Eye pain and Vision changes.  Respiratory Negative Chronic cough, Cough, Dyspnea, Known TB exposure and Wheezing.  Cardio Negative Chest pain, Claudication, Edema and Irregular heartbeat/palpitations.  GI Negative Abdominal pain, Blood in stool, Change in stool pattern, Constipation, Decreased appetite, Diarrhea, Heartburn, Nausea and Vomiting.  GU Negative Dysuria, Hematuria, Polyuria  (Genitourinary), Urinary frequency, Urinary incontinence and Urinary retention.  Endocrine Negative Cold intolerance, Heat intolerance, Polydipsia and Polyphagia.  Neuro Positive Gait disturbance, Numbness in extremity.  Psych Negative Anxiety, Depression and Insomnia.  Integumentary Negative Brittle hair, Brittle nails, Change in shape/size of mole(s), Hair loss, Hirsutism, Hives, Pruritus, Rash and Skin lesion.  MS Positive Back pain, LLE pain.  Hema/Lymph Negative Easy bleeding, Easy bruising and Lymphadenopathy.  Allergic/Immuno Negative Contact allergy, Environmental allergies, Food allergies and Seasonal allergies.  Reproductive Negative Breast discharge, Breast lumps, Dysmenorrhea, Dyspareunia, History of abnormal PAP smear, Hot flashes, Irregular menses and Vaginal discharge.   Vitals Date Temp F BP Pulse Ht In Wt Lb BMI BSA Pain Score  10/13/2016  125/72 99 65 210 34.95  7/10     PHYSICAL EXAM General Level of Distress: no acute distress Overall Appearance: normal  Head and Face  Right Left  Fundoscopic Exam:  normal normal    Cardiovascular Cardiac: regular rate and rhythm without murmur  Right Left  Carotid Pulses: normal normal  Respiratory Lungs: clear to auscultation  Neurological Orientation: normal Recent and Remote Memory: normal Attention Span and Concentration:   normal Language: normal Fund of Knowledge: normal  Right Left Sensation: normal normal Upper Extremity Coordination: normal normal  Lower Extremity Coordination: normal normal  Musculoskeletal Gait and Station: normal  Right Left Upper Extremity Muscle Strength: normal normal Lower Extremity Muscle Strength: normal normal Upper Extremity Muscle Tone:  normal normal Lower Extremity Muscle Tone: normal normal   Motor Strength Upper and lower extremity motor strength was tested in the clinically pertinent muscles.     Deep Tendon  Reflexes  Right Left Biceps: normal normal Triceps: normal  normal Brachioradialis: normal normal Patellar: normal normal Achilles: normal normal  Cranial Nerves II. Optic Nerve/Visual Fields: normal III. Oculomotor: normal IV. Trochlear: normal V. Trigeminal: normal VI. Abducens: normal VII. Facial: normal VIII. Acoustic/Vestibular: normal IX. Glossopharyngeal: normal X. Vagus: normal XI. Spinal Accessory: normal XII. Hypoglossal: normal  Motor and other Tests Lhermittes: negative Rhomberg: negative Pronator drift: absent     Right Left Hoffman's: normal normal Clonus: normal normal Babinski: normal normal   Additional Findings:  Weakness in left quadricepts. Diminished reflex of L3. Diminished pin L3, L4. Positive seated SLR on the left. Fundoscopic exam is normal. Cranial nerve exam is normal. Unable to squat on left leg due to leg buckling.   DIAGNOSTIC RESULTS MRI 09/29/16: Since the previous exam in February, the patient has developed a new large disc extrusion at L4-5 on the left with cephalad migration. Its location medial to the left L4 pedicle should correlate with left L4 nerve root symptoms.    IMPRESSION Patient presents with left hip and leg pain. MRI shows a ruptured disc at L4-5 causing compression of the left L4 nerve. On confrontational testing, the patient had weakness in the left quadricepts and positive seated SLR on the left. She exhibited diminished reflex of L3 and diminished pin L3, L4. Patient will be scheduled for left L4-5 microdiscectomy.  Physical: Weakness in left quadricepts. Diminished reflex of L3. Diminished pin L3, L4. Positive seated SLR on the left. Fundoscopic exam is normal. Cranial nerve exam is normal. Unable to squat on left leg due to leg buckling.  Assessment/Plan # Detail Type Description   1. Assessment DDD (degenerative disc disease), lumbar (M51.36).       2. Assessment Disc displacement, lumbar (M51.26).       3.  Assessment Low back pain, unspecified back pain laterality, with sciatica presence unspecified (M54.5).       4. Assessment Radiculopathy, lumbar region (M54.16).           Pain Management Plan Pain Scale: 7/10. Method: Numeric Pain Intensity Scale. Location: hip. Onset: 09/19/2016. Pain management follow-up plan of care: Patient taking medication as prescribed..  Fall Risk Plan The patient has not fallen in the last year.  Patient will be scheduled for left L4-5 microdiscectomy on Tuesday. Nurse education is given.             Provider:  Vertell Limber MD, Marchia Meiers 10/13/2016 5:20 PM  Dictation edited by: Lucita Lora    CC Providers: Jill Alexanders [facility] [address] [place],  [place]  [id number]-   [place]  [facility] [place], [place] [address]-              Electronically signed by Marchia Meiers. Vertell Limber MD on 10/14/2016 07:31 PM

## 2016-10-18 NOTE — Progress Notes (Signed)
PHARMACIST - PHYSICIAN ORDER COMMUNICATION  CONCERNING: P&T Medication Policy on Herbal Medications  DESCRIPTION:  This patient's orders for: CoQ 10, Krill oil capsule and Cranberry have been noted.  This product(s) is classified as an "herbal" or natural product. Due to a lack of definitive safety studies or FDA approval, nonstandard manufacturing practices, plus the potential risk of unknown drug-drug interactions while on inpatient medications, the Pharmacy and Therapeutics Committee does not permit the use of "herbal" or natural products of this type within [facility].   ACTION TAKEN: The pharmacy department is unable to verify this order at this time and your patient has been informed of this safety policy (informed RN. RN will notify patient).  Please reevaluate patient's clinical condition at discharge and address if the herbal or natural product(s) should be resumed at that time.   Nicole Cella, RPh Clinical Pharmacist Pager: [phone] 10/18/2016 4:28 PM

## 2016-10-19 ENCOUNTER — Encounter (HOSPITAL_COMMUNITY): Payer: Self-pay | Admitting: Neurosurgery

## 2016-10-19 DIAGNOSIS — M5116 Intervertebral disc disorders with radiculopathy, lumbar region: Secondary | ICD-10-CM | POA: Diagnosis not present

## 2016-10-19 LAB — GLUCOSE, CAPILLARY: Glucose-Capillary: 291 mg/dL — ABNORMAL HIGH (ref 65–99)

## 2016-10-19 LAB — HEMOGLOBIN A1C
Hgb A1c MFr Bld: 10.5 % — ABNORMAL HIGH (ref 4.8–5.6)
MEAN PLASMA GLUCOSE: 255 mg/dL

## 2016-10-19 MED ORDER — OXYCODONE-ACETAMINOPHEN 5-325 MG PO TABS: 1.0000 | ORAL_TABLET | ORAL | 0 refills | Status: DC | PRN

## 2016-10-19 MED ORDER — PANTOPRAZOLE SODIUM 40 MG PO TBEC: 40.0000 mg | DELAYED_RELEASE_TABLET | Freq: Every day | ORAL | Status: DC

## 2016-10-19 MED ORDER — LIVING WELL WITH DIABETES BOOK
Freq: Once | Status: AC
  Administered 2016-10-19: 09:00:00
  Filled 2016-10-19: qty 1

## 2016-10-19 NOTE — Progress Notes (Signed)
Inpatient Diabetes Program Recommendations  AACE/ADA: New Consensus Statement on Inpatient Glycemic Control (2015)  Target Ranges:  Prepandial:   less than 140 mg/dL      Peak postprandial:   less than 180 mg/dL (1-2 hours)      Critically ill patients:  140 - 180 mg/dL   Lab Results  Component Value Date   GLUCAP 291 (H) 10/19/2016   HGBA1C 10.5 (H) 10/18/2016   Spoke with patient and husband about elevated A1C.  Patient states that she had blood sugars very controlled prior to hurting her back but then needed steroid injections in her back. She states that in the past, she had her A1c down to 7.3% but had noticed since starting the injections that her blood sugars were rising.   Discussed importance of glycemic control especially with surgery.  Patient verbalized understanding.  I encouraged her to touch base with her PCP regarding potential need for titration up of Levemir due to elevated blood sugars. I also asked her to check blood sugars at least bid and show these to her PCP.  I gave patient "Living Well with Diabetes" booklet for review.  We also discussed the effects of stress and pain on blood sugars.  Discussed blood sugar goals as well.   Patient appreciative of visit.   Will follow. Thanks, Adah Perl, RN, BC-ADM Inpatient Diabetes Coordinator Pager [phone] (8a-5p)

## 2016-10-19 NOTE — Progress Notes (Signed)
Pt doing well. Pt and husband given D/C instructions with Rx, verbal understanding was provided. Pt's incision is clean and dry with no sign of infection. Pt's IV was removed prior to D/C. Pt D/C'd home via wheelchair @ 1140 per MD order. Pt is stable @ D/C and has no other needs at this time. Holli Humbles, RN

## 2016-10-19 NOTE — Progress Notes (Addendum)
Subjective: Patient reports "I feel good...just some incision soreness"  Objective: Vital signs in last 24 hours: Temp:  [97 F (36.1 C)-98.3 F (36.8 C)] 98.3 F (36.8 C) (06/27 0426) Pulse Rate:  [73-94] 81 (06/27 0426) Resp:  [10-20] 18 (06/27 0426) BP: (98-160)/(49-89) 138/83 (06/27 0426) SpO2:  [89 %-98 %] 96 % (06/27 0426) Weight:  [94.3 kg (208 lb)] 94.3 kg (208 lb) (06/26 1049)  Intake/Output from previous day: 06/26 0701 - 06/27 0700 In: 1440 [P.O.:240; I.V.:1200] Out: -  Intake/Output this shift: No intake/output data recorded.  Alert, conversant. Reports no leg pain, no hip pain. Mild lumbar soreness with position changes. Incision without erythema, swelling, or drainage beneath honeycomb and Dermabond. Good strength BLE.   Lab Results:  Recent Labs  10/18/16 1050  WBC 4.7  HGB 13.6  HCT 41.9  PLT 256   BMET  Recent Labs  10/18/16 1050  NA 135  K 4.4  CL 101  CO2 25  GLUCOSE 250*  BUN 14  CREATININE 0.99  CALCIUM 9.8    Studies/Results: Dg Lumbar Spine 2-3 Views  Result Date: 10/18/2016 CLINICAL DATA:  Microdiskectomy. EXAM: LUMBAR SPINE - 2-3 VIEW COMPARISON:  MRI 09/29/2016. FINDINGS: Lumbar vertebra numbered as per prior MRI . Metallic marker noted posteriorly at the L4 level on image number 1. Metallic marker noted posteriorly at the L5 level on image 2. Aortoiliac atherosclerotic vascular calcification. IMPRESSION: Metallic marker noted posteriorly at the L5 level on image 2. Electronically Signed   By: Marcello Moores  Register   On: 10/18/2016 14:23    Assessment/Plan: Improved  LOS: 1 day  Per DrStern, d/c IV, d/c to home. Pt verbalizes understanding of d/c instructions. She already has f/u appt scheduled. She has Robaxin at home for spasms. Percocet 5/325 will be sent from office to pt's pharmacy.    Verdis Prime 10/19/2016, 8:03 AM  Patient doing well.  Discharge home.

## 2016-10-19 NOTE — Evaluation (Signed)
Physical Therapy Evaluation and Discharge Patient Details Name: Haley George MRN: [id number] DOB: 1955/08/21 Today's Date: 10/19/2016   History of Present Illness  Pt is a 61 y.o. female s/p L4-5 Microdiscectomy. PMHx: HTN, MI, IDDM, Skin cancer, L rotator cuff repair, Heart stents x2.  Clinical Impression  Pt presented sitting OOB in chair, awake and willing to participate in therapy session. Prior to admission, pt reported that she was independent with all functional mobility. Pt ambulated in hallway with supervision without an AD, but frequently reaching for railing in hallway. Pt also successfully completed stair training this session with min guard for safety. PT reviewed 3/3 back precautions with pt throughout session. No further acute PT needs identified at this time. PT signing off.     Follow Up Recommendations No PT follow up    Equipment Recommendations  None recommended by PT    Recommendations for Other Services       Precautions / Restrictions Precautions Precautions: Back Precaution Booklet Issued: No (OT provided) Precaution Comments: PT reviewed 3/3 back precautions with pt  Restrictions Weight Bearing Restrictions: No      Mobility  Bed Mobility Overal bed mobility: Needs Assistance Bed Mobility: Rolling;Sidelying to Sit Rolling: Supervision Sidelying to sit: Supervision       General bed mobility comments: pt sitting OOB  Transfers Overall transfer level: Needs assistance Equipment used: None Transfers: Sit to/from Stand Sit to Stand: Supervision         General transfer comment: for safety, no physical assist  Ambulation/Gait Ambulation/Gait assistance: Supervision Ambulation Distance (Feet): 300 Feet Assistive device: None Gait Pattern/deviations: Step-through pattern;Decreased stride length Gait velocity: decreased Gait velocity interpretation: Below normal speed for age/gender General Gait Details: mild instability but no overt LOB or  need for physical assistance, supervision for safety  Stairs Stairs: Yes Stairs assistance: Min guard Stair Management: One rail Left;Step to pattern;Forwards Number of Stairs: 10 General stair comments: min guard for safety  Wheelchair Mobility    Modified Rankin (Stroke Patients Only)       Balance Overall balance assessment: No apparent balance deficits (not formally assessed)                                           Pertinent Vitals/Pain Pain Assessment: 0-10 Pain Score: 7  Faces Pain Scale: Hurts little more Pain Location: back Pain Descriptors / Indicators: Sore Pain Intervention(s): Monitored during session;Repositioned    Home Living Family/patient expects to be discharged to:: Private residence Living Arrangements: Spouse/significant other Available Help at Discharge: Family;Available 24 hours/day;Available PRN/intermittently (24/7 for first day, then husband back to work) Type of Home: House Home Access: Stairs to enter Entrance Stairs-Rails: None Technical brewer of Steps: 1 Home Layout: Two level;Bed/bath upstairs Home Equipment: Shower seat - built in      Prior Function Level of Independence: Independent               Hand Dominance        Extremity/Trunk Assessment   Upper Extremity Assessment Upper Extremity Assessment: Defer to OT evaluation    Lower Extremity Assessment Lower Extremity Assessment: Overall WFL for tasks assessed    Cervical / Trunk Assessment Cervical / Trunk Assessment: Other exceptions Cervical / Trunk Exceptions: s/p spinal sx  Communication   Communication: No difficulties  Cognition Arousal/Alertness: Awake/alert Behavior During Therapy: WFL for tasks assessed/performed Overall Cognitive Status: Within  Functional Limits for tasks assessed                                        General Comments      Exercises     Assessment/Plan    PT Assessment Patent does  not need any further PT services  PT Problem List         PT Treatment Interventions      PT Goals (Current goals can be found in the Care Plan section)  Acute Rehab PT Goals Patient Stated Goal: home today    Frequency     Barriers to discharge        Co-evaluation               AM-PAC PT "6 Clicks" Daily Activity  Outcome Measure Difficulty turning over in bed (including adjusting bedclothes, sheets and blankets)?: A Little Difficulty moving from lying on back to sitting on the side of the bed? : A Little Difficulty sitting down on and standing up from a chair with arms (e.g., wheelchair, bedside commode, etc,.)?: A Little Help needed moving to and from a bed to chair (including a wheelchair)?: None Help needed walking in hospital room?: None Help needed climbing 3-5 steps with a railing? : A Little 6 Click Score: 20    End of Session   Activity Tolerance: Patient tolerated treatment well Patient left: in chair;with call bell/phone within reach;with family/visitor present Nurse Communication: Mobility status PT Visit Diagnosis: Pain Pain - part of body:  (back)    Time: 1884-1660 PT Time Calculation (min) (ACUTE ONLY): 16 min   Charges:   PT Evaluation $PT Eval Moderate Complexity: 1 Procedure     PT G Codes:   PT G-Codes **NOT FOR INPATIENT CLASS** Functional Assessment Tool Used: AM-PAC 6 Clicks Basic Mobility;Clinical judgement Functional Limitation: Mobility: Walking and moving around Mobility: Walking and Moving Around Current Status (Y3016): At least 1 percent but less than 20 percent impaired, limited or restricted Mobility: Walking and Moving Around Goal Status [phone]): 0 percent impaired, limited or restricted Mobility: Walking and Moving Around Discharge Status [phone]): At least 1 percent but less than 20 percent impaired, limited or restricted    [facility], [place], DPT [place] 10/19/2016, 10:08 AM

## 2016-10-19 NOTE — Discharge Instructions (Signed)

## 2016-10-19 NOTE — Discharge Summary (Signed)
Physician Discharge Summary  Patient ID: Haley George MRN: [id number] DOB/AGE: 1955/10/14 61 y.o.  Admit date: 10/18/2016 Discharge date: 10/19/2016  Admission Diagnoses: Disc displacement, Lumbar L 45 with radiculopathy, lumbago, lumbar stenosis, spondylosis    Discharge Diagnoses: Disc displacement, Lumbar L 45 with radiculopathy, lumbago, lumbar stenosis, spondylosis s/p Left Lumbar Four-Five Microdiscectomy (Left) - Left L4-5 Microdiscectomy   Active Problems:   Herniated intervertebral disc of lumbar spine   Discharged Condition: good  Hospital Course: Haley George was admitted for surgery with dx HNP and radiculopathy. Following uncomplicated microdiscectomy Left L4-5 level, she recovered nicely and transferred to [facility] for observation. She is mobilizing nicely with only incisional discomfort.   Consults: None  Significant Diagnostic Studies: radiology: X-Ray: intra-op  Treatments: surgery: Left Lumbar Four-Five Microdiscectomy (Left) - Left L4-5 Microdiscectomy    Discharge Exam: Blood pressure 138/83, pulse 81, temperature 98.3 F (36.8 C), temperature source Oral, resp. rate 18, height 5\' 5"  (1.651 m), weight 94.3 kg (208 lb), SpO2 96 %. Alert, conversant. Reports no leg pain, no hip pain. Mild lumbar soreness with position changes. Incision without erythema, swelling, or drainage beneath honeycomb and Dermabond. Good strength BLE.     Disposition: 01-Home or Self Care  Pt verbalizes understanding of d/c instructions. She already has f/u appt scheduled. She has Robaxin at home for spasms. Percocet 5/325 will be sent from office to pt's pharmacy.     Discharge Instructions    Diet - low sodium heart healthy    Complete by:  As directed    Increase activity slowly    Complete by:  As directed      Allergies as of 10/19/2016      Reactions   Penicillins Hives   PATIENT HAD A PCN REACTION WITH IMMEDIATE RASH, FACIAL/TONGUE/THROAT SWELLING, SOB,  OR LIGHTHEADEDNESS WITH HYPOTENSION:  #  #  #  YES  #  #  #   Has patient had a PCN reaction causing severe rash involving mucus membranes or skin necrosis:Unknown Has patient had a PCN reaction that required hospitalization:No Has patient had a PCN reaction occurring within the last 10 years:No If all of the above answers are "NO", then may proceed with Cephalosporin use.      Medication List    TAKE these medications   aspirin EC 81 MG tablet Take 81 mg by mouth at bedtime.   Coenzyme Q10 300 MG Caps Take 300 mg by mouth daily.   ezetimibe 10 MG tablet Commonly known as:  ZETIA Take 1 tablet (10 mg total) by mouth daily.   fenofibrate 145 MG tablet Commonly known as:  TRICOR TAKE 1 TABLET BY MOUTH EVERY DAY What changed:  See the new instructions.   Fish Oil 1000 MG Caps Take 1,000-2,000 mg by mouth 2 (two) times daily. 1000 mg in the morning & 2000 mg in the evening   gabapentin 300 MG capsule Commonly known as:  NEURONTIN Take 300 mg by mouth 3 (three) times daily.   HYDROcodone-acetaminophen 5-325 MG tablet Commonly known as:  NORCO/VICODIN Take 1 tablet by mouth every 6 (six) hours as needed for moderate pain.   Insulin Pen Needle 31G X 8 MM Misc Commonly known as:  B-D ULTRAFINE III SHORT PEN USE AS DIRECTED E11.9 What changed:  additional instructions   isosorbide mononitrate 30 MG 24 hr tablet Commonly known as:  IMDUR Take 1 tablet (30 mg total) by mouth daily. KEEP OV.   Krill Oil 500 MG Caps Take 500 mg  by mouth every evening.   LEVEMIR FLEXTOUCH 100 UNIT/ML Pen Generic drug:  Insulin Detemir Inject 10 Units into the skin See admin instructions. Blood glucose reading Greater than 300=40 units, greater than 200=30 units, & greater than 150=25 units   This is what patient has been taking on her own.   metFORMIN 500 MG tablet Commonly known as:  GLUCOPHAGE Take 1 tablet (500 mg total) by mouth 2 (two) times daily with a meal.   metoprolol succinate 25  MG 24 hr tablet Commonly known as:  TOPROL-XL Take 1.5 tablets (37.5 mg total) by mouth daily. PLEASE CONTACT OFFICE FOR ADDITIONAL REFILLS   multivitamin with minerals Tabs tablet Take 1 tablet by mouth daily.   NITROSTAT 0.4 MG SL tablet Generic drug:  nitroGLYCERIN DISSOLVE 1 TAB UNDER THE TONGUE EVERY 5 MIN AS NEEDED FOR CHEST PAIN (MAX OF 3)   oxyCODONE-acetaminophen 5-325 MG tablet Commonly known as:  PERCOCET/ROXICET Take 1 tablet by mouth every 8 (eight) hours as needed. For pain. What changed:  Another medication with the same name was added. Make sure you understand how and when to take each.   oxyCODONE-acetaminophen 5-325 MG tablet Commonly known as:  PERCOCET/ROXICET Take 1-2 tablets by mouth every 4 (four) hours as needed for moderate pain or severe pain. What changed:  You were already taking a medication with the same name, and this prescription was added. Make sure you understand how and when to take each.   ranolazine 1000 MG SR tablet Commonly known as:  RANEXA Take 1 tablet (1,000 mg total) by mouth 2 (two) times daily.   rosuvastatin 20 MG tablet Commonly known as:  CRESTOR Take 1 tablet (20 mg total) by mouth daily. What changed:  when to take this   SM CRANBERRY 300 MG tablet Generic drug:  Cranberry Take 300 mg by mouth every evening.   valsartan 160 MG tablet Commonly known as:  DIOVAN Take 1 tablet (160 mg total) by mouth daily.   VITAMIN C PO Take 1 tablet by mouth daily.        Signed: Peggyann Shoals, MD 10/19/2016, 8:37 AM

## 2016-10-19 NOTE — Evaluation (Signed)
Occupational Therapy Evaluation and Discharge Patient Details Name: Haley George MRN: [id number] DOB: [birth year] Today's Date: 10/19/2016    History of Present Illness Pt is a 61 y.o. female s/p L4-5 Microdiscectomy. PMHx: HTN, MI, IDDM, Skin cancer, L rotator cuff repair, Heart stents x2.   Clinical Impression   Pt reports she was independent with ADL PTA. Currently pt supervision with ADL and functional mobility. All back, safety, and ADL education completed with pt. Pt planning to d/c home with intermittent supervision from family. No further acute OT needs identified; signing off at this time. Please re-consult if needs change. Thank you for this referral.    Follow Up Recommendations  No OT follow up;Supervision - Intermittent    Equipment Recommendations  Other (comment)    Recommendations for Other Services       Precautions / Restrictions Precautions Precautions: Back Precaution Booklet Issued: Yes (comment) Precaution Comments: Educated pt on 3/3 back precautions Restrictions Weight Bearing Restrictions: No      Mobility Bed Mobility Overal bed mobility: Needs Assistance Bed Mobility: Rolling;Sidelying to Sit Rolling: Supervision Sidelying to sit: Supervision       General bed mobility comments: Cues for log roll technique. Pt able to perform without physical assist  Transfers Overall transfer level: Needs assistance Equipment used: None Transfers: Sit to/from Stand Sit to Stand: Supervision         General transfer comment: for safety, no physical assist    Balance Overall balance assessment: No apparent balance deficits (not formally assessed)                                         ADL either performed or assessed with clinical judgement   ADL Overall ADL's : Needs assistance/impaired Eating/Feeding: Independent;Sitting   Grooming: Supervision/safety;Standing;Wash/dry hands Grooming Details (indicate cue type and reason):  Educated pt on use of 2 cups for oral care Upper Body Bathing: Set up;Sitting   Lower Body Bathing: Supervison/ safety;Sit to/from stand   Upper Body Dressing : Set up;Sitting   Lower Body Dressing: Supervision/safety;Sit to/from stand Lower Body Dressing Details (indicate cue type and reason): pt able to cross foot over opposite knee. Educated on compensatory strategies for LB dressing Toilet Transfer: Supervision/safety;Ambulation;Regular Toilet;Grab bars   Toileting- Clothing Manipulation and Hygiene: Supervision/safety;Sit to/from stand Toileting - Clothing Manipulation Details (indicate cue type and reason): Educated on proper technique for peri care without twisting and use of wet wipes Tub/ Shower Transfer: Supervision/safety;Walk-in shower;Ambulation;Shower seat   Functional mobility during ADLs: Supervision/safety General ADL Comments: Educated pt on maintaining back precautions during functional activities, keeping frequently used items at counter top height, log roll technique for bed mobility, frequent mobility throguhout the day upon return home.     Vision         Perception     Praxis      Pertinent Vitals/Pain Pain Assessment: Faces Faces Pain Scale: Hurts little more Pain Location: back Pain Descriptors / Indicators: Sore Pain Intervention(s): Monitored during session;Repositioned     Hand Dominance     Extremity/Trunk Assessment Upper Extremity Assessment Upper Extremity Assessment: Overall WFL for tasks assessed   Lower Extremity Assessment Lower Extremity Assessment: Defer to PT evaluation   Cervical / Trunk Assessment Cervical / Trunk Assessment: Other exceptions Cervical / Trunk Exceptions: s/p spinal sx   Communication Communication Communication: No difficulties   Cognition Arousal/Alertness: Awake/alert Behavior During Therapy: [facility]  for tasks assessed/performed Overall Cognitive Status: Within Functional Limits for tasks assessed                                      General Comments       Exercises     Shoulder Instructions      Home Living Family/patient expects to be discharged to:: Private residence Living Arrangements: Spouse/significant other Available Help at Discharge: Family;Available 24 hours/day;Available PRN/intermittently (24/7 for first day, then husband back to work) Type of Home: House       Home Layout: Two level;Bed/bath upstairs     Bathroom Shower/Tub: Occupational psychologist: Standard     Home Equipment: [place] in          Prior Functioning/Environment Level of Independence: Independent                 OT Problem List:        OT Treatment/Interventions:      OT Goals(Current goals can be found in the care plan section) Acute Rehab OT Goals Patient Stated Goal: home today OT Goal Formulation: All assessment and education complete, DC therapy  OT Frequency:     Barriers to D/C:            Co-evaluation              AM-PAC PT "6 Clicks" Daily Activity     Outcome Measure Help from another person eating meals?: None Help from another person taking care of personal grooming?: None Help from another person toileting, which includes using toliet, bedpan, or urinal?: A Little Help from another person bathing (including washing, rinsing, drying)?: A Little Help from another person to put on and taking off regular upper body clothing?: None Help from another person to put on and taking off regular lower body clothing?: A Little 6 Click Score: 21   End of Session Nurse Communication: Mobility status;Other (comment) (no equipment or f/u needs)  Activity Tolerance: Patient tolerated treatment well Patient left: with call bell/phone within reach;Other (comment) (sitting EOB)  OT Visit Diagnosis: Pain Pain - part of body:  (back)                Time: 1610-9604 OT Time Calculation (min): 12 min Charges:  OT General Charges $OT  Visit: 1 Procedure OT Evaluation $OT Eval Moderate Complexity: 1 Procedure G-Codes: OT G-codes **NOT FOR INPATIENT CLASS** Functional Assessment Tool Used: Clinical judgement Functional Limitation: Self care Self Care Current Status (V4098): At least 1 percent but less than 20 percent impaired, limited or restricted Self Care Goal Status (J1914): At least 1 percent but less than 20 percent impaired, limited or restricted Self Care Discharge Status [phone]): At least 1 percent but less than 20 percent impaired, limited or restricted   Mel Almond A. Ulice Brilliant, M.S., OTR/L Pager: [place] 10/19/2016, 9:17 AM

## 2016-10-31 ENCOUNTER — Other Ambulatory Visit: Payer: Self-pay

## 2016-10-31 ENCOUNTER — Telehealth: Payer: Self-pay | Admitting: Family Medicine

## 2016-10-31 MED ORDER — INSULIN DETEMIR 100 UNIT/ML FLEXPEN: 30.0000 [IU] | PEN_INJECTOR | Freq: Every day | SUBCUTANEOUS | 11 refills | Status: DC

## 2016-10-31 NOTE — Telephone Encounter (Signed)
Have her increase her insulin dosing by 2 units every 2 days until her morning blood sugar is under 120

## 2016-10-31 NOTE — Telephone Encounter (Signed)
Pt said she is having difficulty regulating her blood sugar since her her lumbar injectiions and her back surgery about 2 weeks ago. Blood sugar readings have been about 168+ in the mornings and then on average in the mid to upper 160's. She has been taking 30 units of Levemir rather than 10 units. She has an appt to see Dr Redmond School on 7/24. What can she do to regulate BS until appointment?

## 2016-10-31 NOTE — Telephone Encounter (Signed)
Pt informed word for word

## 2016-11-01 ENCOUNTER — Telehealth: Payer: Self-pay | Admitting: Cardiovascular Disease

## 2016-11-01 DIAGNOSIS — L409 Psoriasis, unspecified: Secondary | ICD-10-CM

## 2016-11-01 DIAGNOSIS — E1159 Type 2 diabetes mellitus with other circulatory complications: Secondary | ICD-10-CM

## 2016-11-01 DIAGNOSIS — E785 Hyperlipidemia, unspecified: Secondary | ICD-10-CM

## 2016-11-01 DIAGNOSIS — I152 Hypertension secondary to endocrine disorders: Secondary | ICD-10-CM

## 2016-11-01 DIAGNOSIS — E1169 Type 2 diabetes mellitus with other specified complication: Secondary | ICD-10-CM

## 2016-11-01 DIAGNOSIS — I10 Essential (primary) hypertension: Principal | ICD-10-CM

## 2016-11-01 MED ORDER — ROSUVASTATIN CALCIUM 20 MG PO TABS: 20.0000 mg | ORAL_TABLET | Freq: Every day | ORAL | 3 refills | Status: DC

## 2016-11-01 MED ORDER — ISOSORBIDE MONONITRATE ER 30 MG PO TB24: 30.0000 mg | ORAL_TABLET | Freq: Every day | ORAL | 1 refills | Status: DC

## 2016-11-01 MED ORDER — VALSARTAN 160 MG PO TABS: 160.0000 mg | ORAL_TABLET | Freq: Every day | ORAL | 3 refills | Status: DC

## 2016-11-01 MED ORDER — RANOLAZINE ER 1000 MG PO TB12
1000.0000 mg | ORAL_TABLET | Freq: Two times a day (BID) | ORAL | 1 refills | Status: DC
Start: 2016-11-01 — End: 2017-01-17

## 2016-11-01 MED ORDER — FENOFIBRATE 145 MG PO TABS: 145.0000 mg | ORAL_TABLET | Freq: Every day | ORAL | 1 refills | Status: DC

## 2016-11-01 MED ORDER — METOPROLOL SUCCINATE ER 25 MG PO TB24: 37.5000 mg | ORAL_TABLET | Freq: Every day | ORAL | 1 refills | Status: DC

## 2016-11-01 NOTE — Telephone Encounter (Signed)
The echo is very reassuring and did not show any reason to believe her shortness of breath is heart disease related. I would reschedule her appointment for 6 months. However, if her breathing deteriorates, please have her come in sooner.

## 2016-11-01 NOTE — Telephone Encounter (Signed)
Pt notified she states that her SOB is resolving so she is not concerned with this. Appt scheduled in November for follow up.

## 2016-11-01 NOTE — Telephone Encounter (Signed)
Pt wants to know if she needs the appt on Friday? She said she had gotten her Echo results over the phone.

## 2016-11-04 ENCOUNTER — Ambulatory Visit: Payer: BC Managed Care – PPO | Admitting: Cardiovascular Disease

## 2016-11-09 ENCOUNTER — Telehealth: Payer: Self-pay | Admitting: Family Medicine

## 2016-11-09 ENCOUNTER — Ambulatory Visit (INDEPENDENT_AMBULATORY_CARE_PROVIDER_SITE_OTHER): Payer: BC Managed Care – PPO | Admitting: Family Medicine

## 2016-11-09 ENCOUNTER — Other Ambulatory Visit: Payer: Self-pay | Admitting: Medical

## 2016-11-09 DIAGNOSIS — R319 Hematuria, unspecified: Secondary | ICD-10-CM | POA: Diagnosis not present

## 2016-11-09 LAB — POCT URINALYSIS DIP (PROADVANTAGE DEVICE)
BILIRUBIN UA: NEGATIVE
GLUCOSE UA: NEGATIVE mg/dL
Nitrite, UA: NEGATIVE
PH UA: 6 (ref 5.0–8.0)
SPECIFIC GRAVITY, URINE: 1.03
Urobilinogen, Ur: NEGATIVE

## 2016-11-09 LAB — POCT UA - MICROSCOPIC ONLY
Bacteria, U Microscopic: NEGATIVE
CASTS, UR, LPF, POC: NEGATIVE
CRYSTALS, UR, HPF, POC: NEGATIVE
EPITHELIAL CELLS, URINE PER MICROSCOPY: NEGATIVE
Mucus, UA: NEGATIVE
RBC, urine, microscopic: NEGATIVE
WBC, Ur, HPF, POC: NEGATIVE
Yeast, UA: NEGATIVE

## 2016-11-09 MED ORDER — SULFAMETHOXAZOLE-TRIMETHOPRIM 800-160 MG PO TABS: 1.0000 | ORAL_TABLET | Freq: Two times a day (BID) | ORAL | 0 refills | Status: DC

## 2016-11-09 NOTE — Telephone Encounter (Signed)
Pt came I into office to pick up samples. She wanted her urine checked while she was here. Call JCL and he stated to spin. Audelia Acton looked at urine and sending message back to him.

## 2016-11-09 NOTE — Telephone Encounter (Signed)
UA abnormal suggesting UTI.   Microscopic showed bacteria and blood.   Please document and charge for POCT UA and micro and nurse visit [phone], and I sent Bactrim antibiotic for UTI symptoms she called in about.  Have her f/u with Dr. Redmond School

## 2016-11-15 ENCOUNTER — Encounter: Payer: Self-pay | Admitting: Family Medicine

## 2016-11-15 ENCOUNTER — Ambulatory Visit (INDEPENDENT_AMBULATORY_CARE_PROVIDER_SITE_OTHER): Payer: BC Managed Care – PPO | Admitting: Family Medicine

## 2016-11-15 VITALS — BP 130/80 | HR 77 | Wt 209.4 lb

## 2016-11-15 DIAGNOSIS — E669 Obesity, unspecified: Secondary | ICD-10-CM | POA: Diagnosis not present

## 2016-11-15 DIAGNOSIS — E1159 Type 2 diabetes mellitus with other circulatory complications: Secondary | ICD-10-CM | POA: Diagnosis not present

## 2016-11-15 DIAGNOSIS — E785 Hyperlipidemia, unspecified: Secondary | ICD-10-CM | POA: Diagnosis not present

## 2016-11-15 DIAGNOSIS — E118 Type 2 diabetes mellitus with unspecified complications: Secondary | ICD-10-CM | POA: Diagnosis not present

## 2016-11-15 DIAGNOSIS — E1169 Type 2 diabetes mellitus with other specified complication: Secondary | ICD-10-CM | POA: Diagnosis not present

## 2016-11-15 DIAGNOSIS — I10 Essential (primary) hypertension: Secondary | ICD-10-CM | POA: Diagnosis not present

## 2016-11-15 DIAGNOSIS — Z9889 Other specified postprocedural states: Secondary | ICD-10-CM

## 2016-11-15 NOTE — Progress Notes (Signed)
Subjective:    Patient ID: Haley George, female    DOB: [birth year], 61 y.o.   MRN: [id number]  Haley George is a 61 y.o. female who presents for follow-up of Type 2 diabetes mellitus.  Patient is checking home blood sugars.   Home blood sugar records: BGs range between 140 and 200 How often is blood sugars being checked: twice a day Current symptoms/problems include none and have been stable. Daily foot checks: yes   Any foot concerns: none Last eye exam: Dr. Katy Fitch- April 2018 Exercise: walking maybe 1/2 a mile aday since back surgery  She has had a rough several months. She was getting epidural steroid injections which apparently didn't work. She then had back surgery and again was given steroids. This had the usual effects on her blood sugars. She has been increasing her Levemir and is now on 44 units a day. She did have a morning blood sugar 146. She states that she is walking roughly 20 minutes per day. Also recently she has been having difficulty with UTI symptoms and is about to finish Septra. She has also had some abnormal smells that she thinks might be a sinus infection but this is slowly getting better as well. While in the hospital she did have a hemoglobin A1c  10.5 The following portions of the patient's history were reviewed and updated as appropriate: allergies, current medications, past medical history, past social history and problem list.  ROS as in subjective above.     Objective:    Physical Exam Alert and in no distress otherwise not examined.   Lab Review Diabetic Labs Latest Ref Rng & Units 10/18/2016 07/12/2016 02/10/2015 10/02/2014 05/06/2014  HbA1c 4.8 - 5.6 % 10.5(H) 7.1 6.8 7.9 7.8  Microalbumin mg/L - 125.4 - - 40.3  Micro/Creat Ratio - - 58.3 - - 16.0  Chol <200 mg/dL - 175 - - -  HDL >50 mg/dL - 34(L) - - -  Calc LDL <100 mg/dL - 91 - - -  Triglycerides <150 mg/dL - 249(H) - - -  Creatinine 0.44 - 1.00 mg/dL 0.99 1.04 - - -   BP/Weight 10/19/2016  10/18/2016 09/14/2016 07/12/2016 3/41/9379  Systolic BP 024 - 097 353 299  Diastolic BP 67 - 77 84 88  Wt. (Lbs) - 208 211.4 214 208  BMI - 34.61 35.18 35.61 34.61   Foot/eye exam completion dates Latest Ref Rng & Units 02/12/2016 09/26/2014  Eye Exam No Retinopathy No Retinopathy No Retinopathy  Foot Form Completion - - -    Haley George  reports that she has never smoked. She has never used smokeless tobacco. She reports that she does not drink alcohol or use drugs.     Assessment & Plan:    Hypertension associated with diabetes ([place])  Obesity (BMI 30-39.9)  Diabetes mellitus with complication ([place])  Hyperlipidemia associated with type 2 diabetes mellitus ([place])  History of back surgery    1. Rx changes: none 2. Education: Reviewed 'ABCs' of diabetes management (respective goals in parentheses):  A1C (<7), blood pressure (<130/80), and cholesterol (LDL <100). 3. Compliance at present is estimated to be fair. Efforts to improve compliance (if necessary) will be directed at increased exercise. As directed by her surgeon. Follow up: 4 months I encouraged her to continue with her diet and exercise. Discussed cutting back on carbohydrates. She states that she is taking much better care of herself now that she is now retired and also postop. Explained that eventually we would like  to get her down and potentially off insulin entirely. She will call if she has further urinary tract or sinus symptoms.

## 2016-11-17 NOTE — Progress Notes (Signed)
dt ?

## 2016-11-27 ENCOUNTER — Other Ambulatory Visit: Payer: Self-pay | Admitting: Cardiovascular Disease

## 2016-11-29 ENCOUNTER — Other Ambulatory Visit: Payer: Self-pay | Admitting: Cardiovascular Disease

## 2016-12-25 ENCOUNTER — Other Ambulatory Visit: Payer: Self-pay | Admitting: Cardiovascular Disease

## 2016-12-27 NOTE — Telephone Encounter (Signed)
REFILL 

## 2016-12-28 ENCOUNTER — Telehealth: Payer: Self-pay

## 2016-12-28 MED ORDER — INSULIN DETEMIR 100 UNIT/ML FLEXPEN: 66.0000 [IU] | PEN_INJECTOR | Freq: Every day | SUBCUTANEOUS | 11 refills | Status: DC

## 2016-12-28 NOTE — Telephone Encounter (Signed)
yes

## 2016-12-28 NOTE — Telephone Encounter (Addendum)
[facility] [place] [facility] asking for clarification on pt's Levemir. Pt reports she is currently taking 66 units at bedtime of Levemir and she is breaking this into 2 different injections. Her morning sugars are between 170-204. Do you still want to to continue increasing by 2 units until sugars below 120?    Thank you, Wells Guiles

## 2016-12-28 NOTE — Telephone Encounter (Signed)
Called in new script. Pt aware. [name]

## 2017-01-09 LAB — HM COLONOSCOPY

## 2017-01-16 ENCOUNTER — Encounter: Payer: Self-pay | Admitting: Family Medicine

## 2017-01-17 ENCOUNTER — Ambulatory Visit (INDEPENDENT_AMBULATORY_CARE_PROVIDER_SITE_OTHER): Payer: BC Managed Care – PPO | Admitting: Family Medicine

## 2017-01-17 VITALS — BP 132/82 | HR 86 | Temp 98.0°F | Wt 212.6 lb

## 2017-01-17 DIAGNOSIS — F439 Reaction to severe stress, unspecified: Secondary | ICD-10-CM

## 2017-01-17 DIAGNOSIS — J4520 Mild intermittent asthma, uncomplicated: Secondary | ICD-10-CM

## 2017-01-17 DIAGNOSIS — J309 Allergic rhinitis, unspecified: Secondary | ICD-10-CM

## 2017-01-17 MED ORDER — ALBUTEROL SULFATE HFA 108 (90 BASE) MCG/ACT IN AERS: 2.0000 | INHALATION_SPRAY | Freq: Four times a day (QID) | RESPIRATORY_TRACT | 2 refills | Status: DC | PRN

## 2017-01-17 MED ORDER — ALBUTEROL SULFATE (2.5 MG/3ML) 0.083% IN NEBU
2.5000 mg | INHALATION_SOLUTION | Freq: Once | RESPIRATORY_TRACT | Status: AC
  Administered 2017-01-17: 2.5 mg via RESPIRATORY_TRACT

## 2017-01-17 MED ORDER — CLARITHROMYCIN 500 MG PO TABS: 500.0000 mg | ORAL_TABLET | Freq: Two times a day (BID) | ORAL | 0 refills | Status: DC

## 2017-01-17 NOTE — Progress Notes (Signed)
   Subjective:    Patient ID: Haley George, female    DOB: 10/14/1955, 61 y.o.   MRN: [id number]  HPI He complains of a intermittent history of chest congestion and coughing some wheezing started in late June after she had some surgery. She has an underlying htory of allergic rhinitis and had difficulty with asthma back in the 1980s. No fever, chills, sore throat, nasal congestion. She is having some PND and dry cough. She does not smoke. She is also having difficulty with her daughter who is in college and apparently not doing well. This is putting a lot of stress on the family.   Review of Systems     Objective:   Physical Exam Alert and in no distress. Tympanic membranes and canals are normal. Pharyngeal area is normal. Neck is supple without adenopathy or thyromegaly. Cardiac exam shows a regular sinus rhythm without murmurs or gallops. Lungs show scattered wheezing..        Assessment & Plan:  Mild intermittent asthmatic bronchitis without complication - Plan: albuterol (PROVENTIL) (2.5 MG/3ML) 0.083% nebulizer solution 2.5 mg, albuterol (PROVENTIL) (2.5 MG/3ML) 0.083% nebulizer solution 2.5 mg  Allergic rhinitis, unspecified seasonality, unspecified trigger  Stress at home she was given 2 breathing treatments which did make her much better but still some wheezing was noted. Recommend that she use the albuterol when she gets home demonstrated proper use of the medication. Discussed follow-up with her in 2 weeks or sooner if she has difficulty including possibly going to the emergency room. I will give her erythromycin to make sure there is no underlying infectious component to it. I then discussed the issue with her daughter. She does seem to have a good handle on how to deal with this. Encouraged her to let her daughter know what will and will not occur if she stays in school versus staying at home including paying rent and all of her bills etc. Return here in 2 weeks.

## 2017-01-17 NOTE — Patient Instructions (Signed)
When you get home go ahead and use the inhaler again and you can use it every 4 hours if you need to

## 2017-01-27 ENCOUNTER — Other Ambulatory Visit: Payer: Self-pay | Admitting: Cardiovascular Disease

## 2017-01-27 NOTE — Telephone Encounter (Signed)
Rx(s) sent to pharmacy electronically.  

## 2017-01-30 ENCOUNTER — Encounter: Payer: Self-pay | Admitting: Family Medicine

## 2017-01-30 ENCOUNTER — Ambulatory Visit (INDEPENDENT_AMBULATORY_CARE_PROVIDER_SITE_OTHER): Payer: BC Managed Care – PPO | Admitting: Family Medicine

## 2017-01-30 VITALS — BP 170/88 | HR 86 | Temp 98.2°F | Resp 17 | Ht 65.0 in | Wt 214.0 lb

## 2017-01-30 DIAGNOSIS — J4520 Mild intermittent asthma, uncomplicated: Secondary | ICD-10-CM | POA: Diagnosis not present

## 2017-01-30 DIAGNOSIS — J309 Allergic rhinitis, unspecified: Secondary | ICD-10-CM | POA: Diagnosis not present

## 2017-01-30 DIAGNOSIS — Z23 Encounter for immunization: Secondary | ICD-10-CM | POA: Diagnosis not present

## 2017-01-30 LAB — CBC WITH DIFFERENTIAL/PLATELET
Basophils Absolute: 21 cells/uL (ref 0–200)
Basophils Relative: 0.5 %
EOS PCT: 11.1 %
Eosinophils Absolute: 455 cells/uL (ref 15–500)
HEMATOCRIT: 32.9 % — AB (ref 35.0–45.0)
HEMOGLOBIN: 10.7 g/dL — AB (ref 11.7–15.5)
LYMPHS ABS: 992 {cells}/uL (ref 850–3900)
MCH: 29.2 pg (ref 27.0–33.0)
MCHC: 32.5 g/dL (ref 32.0–36.0)
MCV: 89.6 fL (ref 80.0–100.0)
MONOS PCT: 8.5 %
MPV: 10.8 fL (ref 7.5–12.5)
NEUTROS ABS: 2284 {cells}/uL (ref 1500–7800)
NEUTROS PCT: 55.7 %
Platelets: 251 10*3/uL (ref 140–400)
RBC: 3.67 10*6/uL — ABNORMAL LOW (ref 3.80–5.10)
RDW: 12.6 % (ref 11.0–15.0)
Total Lymphocyte: 24.2 %
WBC mixed population: 349 cells/uL (ref 200–950)
WBC: 4.1 10*3/uL (ref 3.8–10.8)

## 2017-01-30 LAB — COMPREHENSIVE METABOLIC PANEL
AG RATIO: 1.4 (calc) (ref 1.0–2.5)
ALKALINE PHOSPHATASE (APISO): 40 U/L (ref 33–130)
ALT: 22 U/L (ref 6–29)
AST: 28 U/L (ref 10–35)
Albumin: 4.2 g/dL (ref 3.6–5.1)
BILIRUBIN TOTAL: 0.4 mg/dL (ref 0.2–1.2)
BUN/Creatinine Ratio: 14 (calc) (ref 6–22)
BUN: 17 mg/dL (ref 7–25)
CALCIUM: 9.3 mg/dL (ref 8.6–10.4)
CO2: 27 mmol/L (ref 20–32)
Chloride: 105 mmol/L (ref 98–110)
Creat: 1.18 mg/dL — ABNORMAL HIGH (ref 0.50–1.05)
GLUCOSE: 259 mg/dL — AB (ref 65–99)
Globulin: 3.1 g/dL (calc) (ref 1.9–3.7)
Potassium: 5.1 mmol/L (ref 3.5–5.3)
Sodium: 139 mmol/L (ref 135–146)
Total Protein: 7.3 g/dL (ref 6.1–8.1)

## 2017-01-30 MED ORDER — LEVOFLOXACIN 500 MG PO TABS: 500.0000 mg | ORAL_TABLET | Freq: Every day | ORAL | 0 refills | Status: DC

## 2017-01-30 MED ORDER — MOMETASONE FUROATE 220 MCG/INH IN AEPB: 2.0000 | INHALATION_SPRAY | Freq: Every day | RESPIRATORY_TRACT | 12 refills | Status: DC

## 2017-01-30 MED ORDER — ALBUTEROL SULFATE HFA 108 (90 BASE) MCG/ACT IN AERS: 2.0000 | INHALATION_SPRAY | Freq: Four times a day (QID) | RESPIRATORY_TRACT | 2 refills | Status: DC | PRN

## 2017-01-30 NOTE — Progress Notes (Signed)
   Subjective:    Patient ID: Haley George, female    DOB: 18-Jun-1955, 61 y.o.   MRN: [id number]  HPI She is here for a recheck. She states that she is still having difficulty with wheezing and has been using her albuterol 4 times per day. She has had no difficulty with fever, chills, shortness of breath,, sore throat, earache, productive cough.. She does have a history of underlying allergies and has also had a previous history of asthma nothing to this extent.   Review of Systems     Objective:   Physical Exam Alert and in no distress. Tympanic membranes and canals are normal. Pharyngeal area is normal. Neck is supple without adenopathy or thyromegaly. Cardiac exam shows a regular sinus rhythm without murmurs or gallops. Lungs show expiratory wheezing.        Assessment & Plan:  Mild intermittent asthmatic bronchitis without complication - Plan: CBC with Differential/Platelet, Comprehensive metabolic panel, DG Chest 2 View, mometasone (ASMANEX 60 METERED DOSES) 220 MCG/INH inhaler, levofloxacin (LEVAQUIN) 500 MG tablet, albuterol (PROVENTIL HFA;VENTOLIN HFA) 108 (90 Base) MCG/ACT inhaler  Allergic rhinitis, unspecified seasonality, unspecified trigger  Need for influenza vaccination - Plan: Flu Vaccine QUAD 6+ mos PF IM (Fluarix Quad PF) This is not clear-cut as she has a previous history of asthma but this was several years ago. She is really not having any difficulty with infection related symptoms. I will go ahead and treat her with Biaxin and also place her on a steroid inhaler as well as backup Proventil. If this does not work, I will refer to pulmonary for further evaluation

## 2017-01-31 ENCOUNTER — Telehealth: Payer: Self-pay | Admitting: Family Medicine

## 2017-01-31 NOTE — Telephone Encounter (Signed)
Haley George called and left message that you wanted her to take a MVI with iron due to her hgb being low.  She has a ton of MVI without iron.  What strength iron tablet do you recommend her adding to her multi vitamins?  [phone]

## 2017-02-01 ENCOUNTER — Encounter: Payer: Self-pay | Admitting: Internal Medicine

## 2017-02-01 NOTE — Telephone Encounter (Signed)
Called pt and advised per provider to add Haley George 325/65 once daily to her multi vitamin she is already taking.

## 2017-02-06 ENCOUNTER — Telehealth: Payer: Self-pay | Admitting: Family Medicine

## 2017-02-06 MED ORDER — RELION LANCETS THIN 26G MISC: 1.0000 [IU] | Freq: Two times a day (BID) | 12 refills | Status: DC

## 2017-02-06 MED ORDER — GLUCOSE BLOOD VI STRP: ORAL_STRIP | 12 refills | Status: DC

## 2017-02-06 NOTE — Telephone Encounter (Signed)
[facility] test strips

## 2017-02-06 NOTE — Telephone Encounter (Signed)
Called in [facility] [address] Haley George

## 2017-02-06 NOTE — Telephone Encounter (Signed)
Pt states picked up her meds that were refilled but her tests strips, nor lancets and BD nano pen needles were not refilled as she had asked at her last office visit.  Please send to [facility]

## 2017-02-07 ENCOUNTER — Telehealth: Payer: Self-pay | Admitting: Family Medicine

## 2017-02-07 ENCOUNTER — Other Ambulatory Visit: Payer: Self-pay | Admitting: Family Medicine

## 2017-02-07 MED ORDER — GLUCOSE BLOOD VI STRP: ORAL_STRIP | 12 refills | Status: DC

## 2017-02-07 MED ORDER — RELION LANCETS THIN 26G MISC: 1.0000 [IU] | Freq: Two times a day (BID) | 12 refills | Status: DC

## 2017-02-07 MED ORDER — INSULIN PEN NEEDLE 32G X 4 MM MISC: 1 refills | Status: DC

## 2017-02-07 NOTE — Telephone Encounter (Signed)
[facility] of test strips and lancets that she needs so she need for those items to be called in to [facility] on [address] and the needles to be sent [facility] [place]. [facility] and they have not received authorization to refill anything from [place] yet.

## 2017-02-07 NOTE — Telephone Encounter (Signed)
Test strips and lancets called to [facility],  BD pen needles called to [place]. [name]

## 2017-02-08 ENCOUNTER — Telehealth: Payer: Self-pay | Admitting: Family Medicine

## 2017-02-08 LAB — HM MAMMOGRAPHY

## 2017-02-08 NOTE — Telephone Encounter (Signed)
Faxed order to [facility]- they will call pt to schedule. [name]

## 2017-02-08 NOTE — Telephone Encounter (Signed)
Go ahead and set this up

## 2017-02-08 NOTE — Telephone Encounter (Signed)
LM on pt's VCM

## 2017-02-08 NOTE — Telephone Encounter (Signed)
[facility] called, pt there getting mammogram and over due for bone density scan and wanted to know if she could get it while she was there.  Informed them would send message and would be later today before order could be placed if approved.

## 2017-02-13 ENCOUNTER — Encounter: Payer: Self-pay | Admitting: Family Medicine

## 2017-02-13 ENCOUNTER — Ambulatory Visit (INDEPENDENT_AMBULATORY_CARE_PROVIDER_SITE_OTHER): Payer: BC Managed Care – PPO | Admitting: Family Medicine

## 2017-02-13 VITALS — BP 145/80 | HR 79 | Ht 64.96 in | Wt 212.2 lb

## 2017-02-13 DIAGNOSIS — E669 Obesity, unspecified: Secondary | ICD-10-CM

## 2017-02-13 DIAGNOSIS — E118 Type 2 diabetes mellitus with unspecified complications: Secondary | ICD-10-CM | POA: Diagnosis not present

## 2017-02-13 DIAGNOSIS — J4520 Mild intermittent asthma, uncomplicated: Secondary | ICD-10-CM

## 2017-02-13 MED ORDER — FLUTICASONE FUROATE-VILANTEROL 100-25 MCG/INH IN AEPB: 1.0000 | INHALATION_SPRAY | Freq: Every day | RESPIRATORY_TRACT | 3 refills | Status: DC

## 2017-02-13 MED ORDER — FLUCONAZOLE 150 MG PO TABS: 150.0000 mg | ORAL_TABLET | Freq: Once | ORAL | 0 refills | Status: AC

## 2017-02-13 NOTE — Patient Instructions (Signed)
When your blood sugars in the morning run consistently between 100 and 120,you can cut down by 2 units every 2 days to keep your blood sugar in the 120 range

## 2017-02-13 NOTE — Progress Notes (Signed)
   Subjective:    Patient ID: Haley George, female    DOB: 28-Jun-1955, 61 y.o.   MRN: [id number]  HPI She is here for a recheck. She states that she is roughly 50-60% better with the coughing. So far she has had 2 different antibiotics as well as being placed on Asmanex. She is also using albuterol twice per day.An x-ray was ordered however she has not gotten it yet. She also has made some lifestyle changes. She is involved in a 90 day program called [employer]. She describes essentially a ow-carb diet program with exercise. She and her husband are getting involved with this.   Review of Systems     Objective:   Physical Exam Alert and in no distress. Cardiac exam shows regular rhythm without murmurs or gallops. Lungs are clear to auscultation.       Assessment & Plan:  Mild intermittent asthmatic bronchitis without complication - Plan: fluticasone furoate-vilanterol (BREO ELLIPTA) 100-25 MCG/INH AEPB  Obesity (BMI 30-39.9)  Diabetes mellitus with complication ([place]) Since she has not responded well a steroid inhaler, I will give her a combination medication. Discussed the use of albuterol but hopefully lessen twice per week. She will recheck with me in about 3 weeks. I also encouraged her to continue with her diet and exercise program. Discussed possibly lowering her insulin requirement if she maintains blood sugars below 120.Information was put ir [facility]S.

## 2017-02-14 LAB — HM DEXA SCAN

## 2017-03-07 ENCOUNTER — Other Ambulatory Visit: Payer: Self-pay | Admitting: Cardiovascular Disease

## 2017-03-07 NOTE — Telephone Encounter (Signed)
Rx(s) sent to pharmacy electronically.  

## 2017-03-09 ENCOUNTER — Ambulatory Visit
Admission: RE | Admit: 2017-03-09 | Discharge: 2017-03-09 | Disposition: A | Discharge status: Home / Self Care | Payer: BC Managed Care – PPO | Source: Ambulatory Visit | Attending: Family Medicine | Admitting: Family Medicine

## 2017-03-09 ENCOUNTER — Ambulatory Visit (INDEPENDENT_AMBULATORY_CARE_PROVIDER_SITE_OTHER): Payer: BC Managed Care – PPO | Admitting: Cardiovascular Disease

## 2017-03-09 ENCOUNTER — Encounter: Payer: Self-pay | Admitting: Cardiovascular Disease

## 2017-03-09 VITALS — BP 169/84 | HR 80 | Ht 65.0 in | Wt 210.0 lb

## 2017-03-09 DIAGNOSIS — E1169 Type 2 diabetes mellitus with other specified complication: Secondary | ICD-10-CM | POA: Diagnosis not present

## 2017-03-09 DIAGNOSIS — I358 Other nonrheumatic aortic valve disorders: Secondary | ICD-10-CM | POA: Diagnosis not present

## 2017-03-09 DIAGNOSIS — R0602 Shortness of breath: Secondary | ICD-10-CM | POA: Diagnosis not present

## 2017-03-09 DIAGNOSIS — J4520 Mild intermittent asthma, uncomplicated: Secondary | ICD-10-CM

## 2017-03-09 DIAGNOSIS — I10 Essential (primary) hypertension: Secondary | ICD-10-CM

## 2017-03-09 DIAGNOSIS — E668 Other obesity: Secondary | ICD-10-CM | POA: Diagnosis not present

## 2017-03-09 DIAGNOSIS — E1159 Type 2 diabetes mellitus with other circulatory complications: Secondary | ICD-10-CM | POA: Diagnosis not present

## 2017-03-09 DIAGNOSIS — E118 Type 2 diabetes mellitus with unspecified complications: Secondary | ICD-10-CM

## 2017-03-09 DIAGNOSIS — I25118 Atherosclerotic heart disease of native coronary artery with other forms of angina pectoris: Secondary | ICD-10-CM | POA: Diagnosis not present

## 2017-03-09 DIAGNOSIS — E785 Hyperlipidemia, unspecified: Secondary | ICD-10-CM

## 2017-03-09 MED ORDER — RANOLAZINE ER 1000 MG PO TB12: 1000.0000 mg | ORAL_TABLET | Freq: Two times a day (BID) | ORAL | 3 refills | Status: DC

## 2017-03-09 NOTE — Patient Instructions (Signed)
Dr Croitoru recommends that you schedule a follow-up appointment in 12 months. You will receive a reminder letter in the mail two months in advance. If you don't receive a letter, please call our office to schedule the follow-up appointment.  If you need a refill on your cardiac medications before your next appointment, please call your pharmacy. 

## 2017-03-09 NOTE — Progress Notes (Signed)
Patient ID: Haley George, female   DOB: January 14, 1956, 61 y.o.   MRN: [id number]    Cardiology Office Note    Date:  03/09/2017   ID:  Haley George, DOB [birth year], MRN [id number]  PCP:  Denita Lung, MD  Cardiologist:   Sanda Klein, MD   Chief Complaint  Patient presents with  . Follow-up    6 months; No Sx.    History of Present Illness:  Haley George is a 61 y.o. female with previous percutaneous revascularization procedures performed in all 3 major coronary arteries and restenosis requiring repeat PCI. The last interventions were performed in 2010 when she had overlapping stents placed at the level of older stents in the mid LAD artery and in the left circumflex coronary artery. She has insulin-requiring type 2 diabetes mellitus and secondary mixed hypertriglyceridemia and hypercholesterolemia. She seems to have had substantial improvement with Ranexa. Her most recent nuclear stress test performed in November 2016 was normal.  2007 mid LAD 2.2512 mini vision, left circumflex 2.518 Cypher, RCA 3.023 Cypher 2010 mid LAD 2.2512 Taxus overlapping 2.012 mini vision, overlapping another 2.012 mini vision, left circumflex 2.516 Taxus upstream of previous stent.  She is having some protracted problems with wheezing and dyspnea and coughing.  She has completed a couple of rounds of antibiotics and is taking inhalers including inhaled steroids.  She is due to see her primary care physician about this soon.  Symptoms of wheezing seem to have worsened since her back surgery earlier this summer.  Does not have edema, orthopnea or PND.  Echo in June 2018 showed normal left ventricular systolic function.  There was mild LVH and relaxation impairment, but without signs of elevated filling pressures. She had a normal nuclear stress test in November 2016. EF was 56%. She had a "false positive" ECG response.    Most recent hemoglobin A1c in June was substantially worse at 10.5%, after having  improved earlier this year (7.1% in March).  This was likely after receiving four steroid injections in her back.  She eventually had to undergo lumbar spine surgery with Dr. Vertell Limber which has led to substantial improvement in her mobility.  In March her lipid profile showed an LDL cholesterol of 91, but triglycerides of 249 and HDL of 34.  Most recent labs from October show that she is mildly anemic with a hemoglobin of 10.7, normocytic normochromic.  She had donated blood just before those  tests were drawn.   Past Medical History:  Diagnosis Date  . Asthma   . Cancer ([place])    basal cell chest  . Complication of anesthesia   . Coronary artery disease   . Depression   . Diabetes mellitus without complication ([place])    type 2  . Heart murmur   . Hepatitis    auto- immune  . History of kidney stones   . History of nuclear stress test 05/21/2010   dipyridamole; normal perfusion, preserved LV systolic EF of 40%  . Hyperlipidemia   . Hypertension   . Liver failure ([place])    h/o autoimmune hepatitis   . Myocardial infarction ([place])   . Neuropathy    tingling and burning left leg to foot  . Obesity   . PONV (postoperative nausea and vomiting)   . Psoriasis   . Tubular adenoma of colon    colon path 01/09/2017    Past Surgical History:  Procedure Laterality Date  . Carotid Doppler  10/2008   R &  L ICAs 0-49% diameter reduction   . COLONOSCOPY    . CORONARY ANGIOPLASTY WITH STENT PLACEMENT  09/2008   2.25x81mm Cypher DES to in-stent restenosis of prox LAD; Mini-Vision stent x2 2.0x70mm to area distal of initial LAD stent; 3 2.5x25mm Taxus stents prox to Cypher stent in circumflex  . CORONARY ANGIOPLASTY WITH STENT PLACEMENT  09/2005   Cypher DES 3.0x74mm to distal RCA; 2.25x68mm Mini0Vision stent to mid LAD; 2.5x61mm Cypher stent to circumflex  . LUMBAR LAMINECTOMY/DECOMPRESSION MICRODISCECTOMY Left 10/18/2016   Procedure: Left Lumbar Four-Five Microdiscectomy;  Surgeon: Erline Levine, MD;   Location: [facility];  Service: Neurosurgery;  Laterality: Left;  Left L4-5 Microdiscectomy  . ROTATOR CUFF REPAIR Left   . SHOULDER ARTHROSCOPY W/ ROTATOR CUFF REPAIR Right   . TRANSTHORACIC ECHOCARDIOGRAM  04/2010   EF=>55%, borderline conc LVH; trace MR & TR; AV mildly sclerotic; aortic root sclerosis/calcif  . ureteral stents      Outpatient Medications Prior to Visit  Medication Sig Dispense Refill  . albuterol (PROVENTIL HFA;VENTOLIN HFA) 108 (90 Base) MCG/ACT inhaler Inhale 2 puffs into the lungs every 6 (six) hours as needed for wheezing or shortness of breath. 1 Inhaler 2  . Ascorbic Acid (VITAMIN C PO) Take 1 tablet by mouth daily.    Marland Kitchen aspirin EC 81 MG tablet Take 81 mg by mouth at bedtime.    . Coenzyme Q10 300 MG CAPS Take 300 mg by mouth daily.    . Cranberry (SM CRANBERRY) 300 MG tablet Take 300 mg by mouth every evening.    . ezetimibe (ZETIA) 10 MG tablet Take 1 tablet (10 mg total) by mouth daily. 90 tablet 3  . fenofibrate (TRICOR) 145 MG tablet TAKE 1 TABLET BY MOUTH EVERY DAY 30 tablet 7  . fluticasone furoate-vilanterol (BREO ELLIPTA) 100-25 MCG/INH AEPB Inhale 1 puff into the lungs daily. 3 each 3  . glucose blood test strip Pt uses relion meter- test bid for glucose montioring E11.9 Please dispense relion test strips. 100 each 12  . Insulin Detemir (LEVEMIR FLEXPEN) 100 UNIT/ML Pen Inject 66 Units into the skin daily at 10 pm. Patient is to go up 2 units every two days till morning reading is 120 (Patient taking differently: Inject 80 Units into the skin daily at 10 pm. Patient is to go up 2 units every two days till morning reading is 120) 30 mL 11  . Insulin Pen Needle (BD PEN NEEDLE NANO U/F) 32G X 4 MM MISC Use as directed for insulin administration. 100 each 1  . isosorbide mononitrate (IMDUR) 30 MG 24 hr tablet Take 1 tablet (30 mg total) by mouth daily. 90 tablet 1  . Krill Oil 500 MG CAPS Take 500 mg by mouth every evening.    Marland Kitchen levofloxacin (LEVAQUIN) 500 MG tablet  Take 1 tablet (500 mg total) by mouth daily. 7 tablet 0  . metFORMIN (GLUCOPHAGE) 500 MG tablet Take 1 tablet (500 mg total) by mouth 2 (two) times daily with a meal. 180 tablet 3  . metoprolol succinate (TOPROL-XL) 25 MG 24 hr tablet Take 1.5 tablets (37.5 mg total) by mouth daily. 135 tablet 1  . mometasone (ASMANEX 60 METERED DOSES) 220 MCG/INH inhaler Inhale 2 puffs into the lungs daily. 1 Inhaler 12  . Multiple Vitamin (MULTIVITAMIN WITH MINERALS) TABS Take 1 tablet by mouth daily.    Marland Kitchen NITROSTAT 0.4 MG SL tablet DISSOLVE 1 TAB UNDER THE TONGUE EVERY 5 MIN AS NEEDED FOR CHEST PAIN (MAX OF 3)  25 tablet 1  . Omega-3 Fatty Acids (FISH OIL) 1000 MG CAPS Take 1,000-2,000 mg by mouth 2 (two) times daily. 1000 mg in the morning & 2000 mg in the evening    . RANEXA 1000 MG SR tablet TAKE 1 TABLET BY MOUTH TWICE DAILY 180 tablet 0  . RELION LANCETS THIN 26G MISC 1 Units by Does not apply route 2 (two) times daily. E 11.9 100 each 12  . rosuvastatin (CRESTOR) 20 MG tablet Take 1 tablet (20 mg total) by mouth daily. 90 tablet 3  . valsartan (DIOVAN) 160 MG tablet Take 1 tablet (160 mg total) by mouth daily. 90 tablet 3  . fenofibrate (TRICOR) 145 MG tablet Take 1 tablet (145 mg total) by mouth daily. 90 tablet 1   No facility-administered medications prior to visit.      Allergies:   Penicillins   Social History   Socioeconomic History  . Marital status: Single    Spouse name: None  . Number of children: 1  . Years of education: None  . Highest education level: None  Social Needs  . Financial resource strain: None  . Food insecurity - worry: None  . Food insecurity - inability: None  . Transportation needs - medical: None  . Transportation needs - non-medical: None  Occupational History    Employer: [employer]  Tobacco Use  . Smoking status: Never Smoker  . Smokeless tobacco: Never Used  Substance and Sexual Activity  . Alcohol use: No  . Drug use: No  . Sexual  activity: Not Currently  Other Topics Concern  . None  Social History Narrative  . None     ROS:   Please see the history of present illness.    ROS All other systems reviewed and are negative.   PHYSICAL EXAM:   VS:  BP (!) 169/84   Pulse 80   Ht 5\' 5"  (1.651 m)   Wt 210 lb (95.3 kg)   BMI 34.95 kg/m     General: Alert, oriented x3, no distress, moderately obese Head: no evidence of trauma, PERRL, EOMI, no exophtalmos or lid lag, no myxedema, no xanthelasma; normal ears, nose and oropharynx Neck: normal jugular venous pulsations and no hepatojugular reflux; brisk carotid pulses without delay and no carotid bruits Chest: clear to auscultation, no signs of consolidation by percussion or palpation, normal fremitus, symmetrical and full respiratory excursions Cardiovascular: normal position and quality of the apical impulse, regular rhythm, normal first and second heart sounds, early peaking 3/6 aortic ejection murmur at the right upper sternal border radiating towards the carotids, no diastolic murmurs, rubs or gallops Abdomen: no tenderness or distention, no masses by palpation, no abnormal pulsatility or arterial bruits, normal bowel sounds, no hepatosplenomegaly Extremities: no clubbing, cyanosis or edema; 2+ radial, ulnar and brachial pulses bilaterally; 2+ right femoral, posterior tibial and dorsalis pedis pulses; 2+ left femoral, posterior tibial and dorsalis pedis pulses; no subclavian or femoral bruits Neurological: grossly nonfocal Psych: Normal mood and affect   Wt Readings from Last 3 Encounters:  03/09/17 210 lb (95.3 kg)  02/13/17 212 lb 3.2 oz (96.3 kg)  01/30/17 214 lb (97.1 kg)      Studies/Labs Reviewed:   EKG:  EKG is ordered today.  The ekg ordered today demonstrates NSR, normal tracing, QTC 438 ms.   Recent Labs: 01/30/2017: ALT 22; BUN 17; Creat 1.18; Hemoglobin 10.7; Platelets 251; Potassium 5.1; Sodium 139   Lipid Panel    Component Value Date/Time  CHOL 175 07/12/2016 1413   TRIG 249 (H) 07/12/2016 1413   HDL 34 (L) 07/12/2016 1413   CHOLHDL 5.1 (H) 07/12/2016 1413   VLDL 50 (H) 07/12/2016 1413   LDLCALC 91 07/12/2016 1413   LDLDIRECT 93 11/15/2012 0829    ASSESSMENT:    No diagnosis found.   PLAN:  In order of problems listed above:  1. CAD: She does not have angina pectoris while taking 3 different antianginal medications.  Had a recent low risk nuclear stress test.  She has had an excellent symptomatic response to treatment with Ranexa  2. Dyspnea: Recent echo showed no evidence to support either systolic or diastolic heart failure.  She is clearly wheezing at rest today.  Clinically there are no signs of hypervolemia.  The problem seems to be related to reactive airway disease. 3. Ao sclerosis: Her murmur is very prominent but the echo shows no evidence of significant aortic stenosis.Marland Kitchen 4. HLP: Requires 3 agents to keep her cholesterol and triglycerides in favorable range, due to mixed hyper lipidemia at baseline.  suspect that with her recent worsening glycemic control we will also see worsening triglyceride level. 5. HTN: Blood pressure is quite high today, but she has not yet taken her medicines.  Reports good blood pressure control at home.  Over the summer her blood pressure was consistently around 130/80. 6. DM: Substantial worsening of glycemic control related to steroid injections.  Hopefully we will see improvement on labs that she will have done this coming Monday with Dr. Redmond School.  She is on a high dose of insulin.  Encouraged her to  discussed the use of SGLT2 inhibitors such as empagliflozin with Dr. Redmond School. 7. Obesity: Over the summer her back problems prevented exercise and now she has a lot of issues with reactive airway disease and wheezing.  Eventually need to get her back to regular physical activity.    Medication Adjustments/Labs and Tests Ordered: Current medicines are reviewed at length with the patient  today.  Concerns regarding medicines are outlined above.  Medication changes, Labs and Tests ordered today are listed in the Patient Instructions below. There are no Patient Instructions on file for this visit.     Signed, Sanda Klein, MD  03/09/2017 9:20 AM    [facility] [address], [place], [place]  [address] Phone: [phone]; Fax: [phone]

## 2017-03-13 ENCOUNTER — Encounter: Payer: Self-pay | Admitting: Family Medicine

## 2017-03-13 ENCOUNTER — Ambulatory Visit: Payer: BC Managed Care – PPO | Admitting: Family Medicine

## 2017-03-13 VITALS — BP 150/80 | HR 75 | Resp 16 | Ht 65.0 in | Wt 205.6 lb

## 2017-03-13 DIAGNOSIS — I10 Essential (primary) hypertension: Secondary | ICD-10-CM | POA: Diagnosis not present

## 2017-03-13 DIAGNOSIS — E118 Type 2 diabetes mellitus with unspecified complications: Secondary | ICD-10-CM

## 2017-03-13 DIAGNOSIS — I2510 Atherosclerotic heart disease of native coronary artery without angina pectoris: Secondary | ICD-10-CM

## 2017-03-13 DIAGNOSIS — E1159 Type 2 diabetes mellitus with other circulatory complications: Secondary | ICD-10-CM | POA: Diagnosis not present

## 2017-03-13 DIAGNOSIS — J4520 Mild intermittent asthma, uncomplicated: Secondary | ICD-10-CM | POA: Diagnosis not present

## 2017-03-13 DIAGNOSIS — E1169 Type 2 diabetes mellitus with other specified complication: Secondary | ICD-10-CM

## 2017-03-13 DIAGNOSIS — E669 Obesity, unspecified: Secondary | ICD-10-CM | POA: Diagnosis not present

## 2017-03-13 DIAGNOSIS — E785 Hyperlipidemia, unspecified: Secondary | ICD-10-CM | POA: Diagnosis not present

## 2017-03-13 LAB — POCT UA - MICROALBUMIN
ALBUMIN/CREATININE RATIO, URINE, POC: 18.3
CREATININE, POC: 32.2 mg/dL
MICROALBUMIN (UR) POC: 5.9 mg/L

## 2017-03-13 LAB — POCT GLYCOSYLATED HEMOGLOBIN (HGB A1C): HEMOGLOBIN A1C: 7.3

## 2017-03-13 MED ORDER — CLARITHROMYCIN 500 MG PO TABS: 500.0000 mg | ORAL_TABLET | Freq: Two times a day (BID) | ORAL | 0 refills | Status: DC

## 2017-03-13 MED ORDER — FLUTICASONE FUROATE-VILANTEROL 200-25 MCG/INH IN AEPB: 1.0000 | INHALATION_SPRAY | Freq: Every day | RESPIRATORY_TRACT | 11 refills | Status: DC

## 2017-03-13 NOTE — Progress Notes (Signed)
Subjective:    Patient ID: Haley George, female    DOB: 1956/01/25, 61 y.o.   MRN: [id number]  Haley George is a 61 y.o. female who presents for follow-up of Type 2 diabetes mellitus.  Patient is checking home blood sugars.   Home blood sugar records: BGs range between 87 and 125 How often is blood sugars being checked: daily Current symptoms/problems include none and have been unchanged. Daily foot checks: yes    Any foot concerns: none Last eye exam: Dr Katy Fitch in April  Exercise: She is involved in a program called [employer] [facility] and is walking 6 days a week for about an hour.  She is involved in a dietary program that cuts back on carbohydrates as well as more frequent but smaller meals.  She continues to have difficulty with cough and congestion as well as wheezing.  She was treated with Cipro and did not clear up the coughing.  She continues on Breo and her albuterol inhaler.  She is using the albuterol once or twice per day.  She was recently seen by her dermatologist and cardiologist.  She is taking Ranexa and seems to be doing well on that.  She does see her cardiologist regularly.  The following portions of the patient's history were reviewed and updated as appropriate: allergies, current medications, past medical history, past social history and problem list.  ROS as in subjective above.     Objective:    Physical Exam Alert and in no distress otherwise not examined.   Lab Review Diabetic Labs Latest Ref Rng & Units 03/13/2017 01/30/2017 10/18/2016 07/12/2016 02/10/2015  HbA1c - 7.3 - 10.5(H) 7.1 6.8  Microalbumin mg/L 5.9 - - 125.4 -  Micro/Creat Ratio - 18.3 - - 58.3 -  Chol <200 mg/dL - - - 175 -  HDL >50 mg/dL - - - 34(L) -  Calc LDL <100 mg/dL - - - 91 -  Triglycerides <150 mg/dL - - - 249(H) -  Creatinine 0.50 - 1.05 mg/dL - 1.18(H) 0.99 1.04 -   BP/Weight 03/13/2017 03/09/2017 02/13/2017 01/30/2017 11/11/9468  Systolic BP 962 836 629 476 546  Diastolic BP 80  84 80 88 82  Wt. (Lbs) 205.6 210 212.2 214 212.6  BMI 34.21 34.95 35.35 35.61 35.38   Foot/eye exam completion dates Latest Ref Rng & Units 02/12/2016 09/26/2014  Eye Exam No Retinopathy No Retinopathy No Retinopathy  Foot Form Completion - - -  A1c was 7.3  [place]  reports that  has never smoked. she has never used smokeless tobacco. She reports that she does not drink alcohol or use drugs.     Assessment & Plan:    Diabetes mellitus with complication ([place]) - Plan: HgB A1c, POCT UA - Microalbumin  Mild intermittent asthmatic bronchitis without complication  Obesity (BMI 30-39.9)  Hypertension associated with diabetes ([place])  Hyperlipidemia associated with type 2 diabetes mellitus ([place])  ASHD (arteriosclerotic heart disease)   1. Rx changes: I will place her on Biaxin as well as increase the Breo  2. Education: Reviewed 'ABCs' of diabetes management (respective goals in parentheses):  A1C (<7), blood pressure (<130/80), and cholesterol (LDL <100). 3. Compliance at present is estimated to be good. Efforts to improve compliance (if necessary) will be directed at Continue with present medication and exercise regimen. 4. Follow up: 4 months Also discussed the possibility of using Jardiance but since she is really doing a pretty good job of getting her A1c under control, I will hold  off on adding that medication to the regimen. She is to call me in 2 weeks and let me know how she is doing and if no improvement in her pulmonary function, refer to pulmonary will be made.

## 2017-03-13 NOTE — Patient Instructions (Addendum)
When your morning blood sugars start staying below 100 and regular readjust her insulin Call me in 2 weeks to let me know how your breathing is

## 2017-03-14 ENCOUNTER — Telehealth: Payer: Self-pay

## 2017-03-14 ENCOUNTER — Telehealth: Payer: Self-pay | Admitting: Cardiovascular Disease

## 2017-03-14 DIAGNOSIS — E1169 Type 2 diabetes mellitus with other specified complication: Secondary | ICD-10-CM

## 2017-03-14 DIAGNOSIS — L409 Psoriasis, unspecified: Secondary | ICD-10-CM

## 2017-03-14 DIAGNOSIS — E1159 Type 2 diabetes mellitus with other circulatory complications: Secondary | ICD-10-CM

## 2017-03-14 DIAGNOSIS — E785 Hyperlipidemia, unspecified: Secondary | ICD-10-CM

## 2017-03-14 DIAGNOSIS — I10 Essential (primary) hypertension: Principal | ICD-10-CM

## 2017-03-14 MED ORDER — DOXYCYCLINE HYCLATE 100 MG PO TABS: 100.0000 mg | ORAL_TABLET | Freq: Two times a day (BID) | ORAL | 0 refills | Status: DC

## 2017-03-14 NOTE — Telephone Encounter (Signed)
New Message  Pt call requesting to speak with RN about her medication list. Please call back to discuss

## 2017-03-14 NOTE — Telephone Encounter (Signed)
Asked the pharmacist which antibiotics can be used on her present medication regimen.  I am interested in either doxycycline or Septra.  Find out and let me know

## 2017-03-14 NOTE — Telephone Encounter (Signed)
lmtcb

## 2017-03-14 NOTE — Telephone Encounter (Signed)
Call in doxycycline 100 mg.  1 twice per day, 20 pills.

## 2017-03-14 NOTE — Telephone Encounter (Signed)
Pt aware doxy called into pharmacy. Haley George

## 2017-03-14 NOTE — Telephone Encounter (Signed)
Called in doxycycline, D/C order for Biaxcin. LM for pt to CB. [name]

## 2017-03-14 NOTE — Telephone Encounter (Signed)
Spoke with Darnelle Maffucci at pharmacy- he reports doxycycline is best choice for the Renexa. [name]

## 2017-03-14 NOTE — Telephone Encounter (Signed)
Pt called and let VCM that apparently when I called the pharmacy yesterday to verify if there were any interactions between Renexa and Biacin- I was informed there was not. Haley George went to pharmacy and they would not dispense drug to her because we were given incorrect information because the "computers were down."   Haley George needs a new ABX called into [facility]. [name]

## 2017-03-14 NOTE — Telephone Encounter (Signed)
Returning your call. °

## 2017-03-15 ENCOUNTER — Other Ambulatory Visit: Payer: Self-pay

## 2017-03-15 MED ORDER — EZETIMIBE 10 MG PO TABS: 10.0000 mg | ORAL_TABLET | Freq: Every day | ORAL | 3 refills | Status: DC

## 2017-03-15 MED ORDER — METOPROLOL SUCCINATE ER 25 MG PO TB24: 37.5000 mg | ORAL_TABLET | Freq: Every day | ORAL | 3 refills | Status: DC

## 2017-03-15 MED ORDER — VALSARTAN 160 MG PO TABS: 160.0000 mg | ORAL_TABLET | Freq: Every day | ORAL | 3 refills | Status: DC

## 2017-03-15 MED ORDER — RANOLAZINE ER 1000 MG PO TB12: 1000.0000 mg | ORAL_TABLET | Freq: Two times a day (BID) | ORAL | 3 refills | Status: DC

## 2017-03-15 MED ORDER — ROSUVASTATIN CALCIUM 20 MG PO TABS: 20.0000 mg | ORAL_TABLET | Freq: Every day | ORAL | 3 refills | Status: DC

## 2017-03-15 MED ORDER — FENOFIBRATE 145 MG PO TABS: 145.0000 mg | ORAL_TABLET | Freq: Every day | ORAL | 3 refills | Status: DC

## 2017-03-15 MED ORDER — ISOSORBIDE MONONITRATE ER 30 MG PO TB24: 30.0000 mg | ORAL_TABLET | Freq: Every day | ORAL | 3 refills | Status: DC

## 2017-03-15 NOTE — Telephone Encounter (Signed)
Opened in error

## 2017-03-15 NOTE — Telephone Encounter (Signed)
Returned call to patient. Patient had no more refills on her cardiac prescriptions and was concerned, since she was just seen by Dr Sallyanne Kuster. Rx(s) sent to patient's preferred pharmacy electronically.  Patient also wanted to make note that she had labs completed on Monday with her PCP (Dr Redmond School). Her Hemoglobin A1c was 7.3% after 1 month of healthy eating. Dr Redmond School recommended waiting until her follow-up appointment in January to discuss the addition of Jardiance.

## 2017-03-23 ENCOUNTER — Encounter: Payer: Self-pay | Admitting: Family Medicine

## 2017-03-27 ENCOUNTER — Telehealth: Payer: Self-pay | Admitting: Family Medicine

## 2017-03-27 DIAGNOSIS — R05 Cough: Secondary | ICD-10-CM

## 2017-03-27 DIAGNOSIS — R059 Cough, unspecified: Secondary | ICD-10-CM

## 2017-03-27 NOTE — Telephone Encounter (Signed)
Pt aware of referral. Advised pt to CB if no call within next few days. [name]

## 2017-03-27 NOTE — Telephone Encounter (Signed)
Refer her to pulmonary

## 2017-03-27 NOTE — Addendum Note (Signed)
Addended by: Arley Phenix L on: 03/27/2017 04:52 PM   Modules accepted: Orders

## 2017-03-27 NOTE — Telephone Encounter (Signed)
Pt called and states that she is still coughing and wheezing and still congested she has finished her 3 round of antibiotics, she is wanting to know what needs to be done now, if she needs  To be referred to the pulmonolgist now pt can be reached at   [phone]

## 2017-03-28 ENCOUNTER — Encounter: Payer: Self-pay | Admitting: Family Medicine

## 2017-04-06 ENCOUNTER — Ambulatory Visit: Payer: BC Managed Care – PPO | Admitting: Internal Medicine

## 2017-04-06 ENCOUNTER — Other Ambulatory Visit (INDEPENDENT_AMBULATORY_CARE_PROVIDER_SITE_OTHER): Payer: BC Managed Care – PPO

## 2017-04-06 ENCOUNTER — Encounter: Payer: Self-pay | Admitting: Internal Medicine

## 2017-04-06 DIAGNOSIS — J45901 Unspecified asthma with (acute) exacerbation: Secondary | ICD-10-CM

## 2017-04-06 DIAGNOSIS — J329 Chronic sinusitis, unspecified: Secondary | ICD-10-CM | POA: Diagnosis not present

## 2017-04-06 DIAGNOSIS — J82 Pulmonary eosinophilia, not elsewhere classified: Secondary | ICD-10-CM

## 2017-04-06 DIAGNOSIS — J8283 Eosinophilic asthma: Secondary | ICD-10-CM

## 2017-04-06 LAB — CBC WITH DIFFERENTIAL/PLATELET
BASOS ABS: 0 10*3/uL (ref 0.0–0.1)
Basophils Relative: 0.7 % (ref 0.0–3.0)
EOS PCT: 14.7 % — AB (ref 0.0–5.0)
Eosinophils Absolute: 0.6 10*3/uL (ref 0.0–0.7)
HCT: 38.9 % (ref 36.0–46.0)
Hemoglobin: 12.7 g/dL (ref 12.0–15.0)
LYMPHS ABS: 0.9 10*3/uL (ref 0.7–4.0)
Lymphocytes Relative: 21.2 % (ref 12.0–46.0)
MCHC: 32.8 g/dL (ref 30.0–36.0)
MCV: 87.6 fl (ref 78.0–100.0)
MONOS PCT: 7.1 % (ref 3.0–12.0)
Monocytes Absolute: 0.3 10*3/uL (ref 0.1–1.0)
NEUTROS ABS: 2.4 10*3/uL (ref 1.4–7.7)
NEUTROS PCT: 56.3 % (ref 43.0–77.0)
PLATELETS: 256 10*3/uL (ref 150.0–400.0)
RBC: 4.44 Mil/uL (ref 3.87–5.11)
RDW: 14.4 % (ref 11.5–15.5)
WBC: 4.3 10*3/uL (ref 4.0–10.5)

## 2017-04-06 LAB — NITRIC OXIDE: NITRIC OXIDE: 78

## 2017-04-06 MED ORDER — ALBUTEROL SULFATE (2.5 MG/3ML) 0.083% IN NEBU
2.5000 mg | INHALATION_SOLUTION | Freq: Once | RESPIRATORY_TRACT | Status: AC
  Administered 2017-04-06: 2.5 mg via RESPIRATORY_TRACT

## 2017-04-06 MED ORDER — PREDNISONE 10 MG PO TABS: ORAL_TABLET | ORAL | 0 refills | Status: DC

## 2017-04-06 MED ORDER — FLUTICASONE FUROATE-VILANTEROL 200-25 MCG/INH IN AEPB: 1.0000 | INHALATION_SPRAY | Freq: Every day | RESPIRATORY_TRACT | 5 refills | Status: DC

## 2017-04-06 MED ORDER — FLUTICASONE PROPIONATE 50 MCG/ACT NA SUSP: 2.0000 | Freq: Every day | NASAL | 3 refills | Status: DC

## 2017-04-06 NOTE — Progress Notes (Signed)
Subjective:    Patient ID: Haley George, female    DOB: Feb 23, 1956, 61 y.o.   MRN: [id number]  PCP Denita Lung, MD  HPI   IOV 04/06/2017  Chief Complaint  Patient presents with  . Advice Only    Referred by Dr. Redmond School.  Pt has had a cough with congestion since early August. Pt been on multiple abx which has not helped the cough. Pt states cough is worse at night when lying down and has wheezing involved.       61 year old female.  She has been retired for 3 years.  She does some public speaking.  She is a non-smoker.  She lives in a 61 year old house with mixed carpet and wooden floors.  She has a indoor outdoor cat from many years.  She has no family history of asthma.  She has a personal history of possible asthma 1983 when she developed post bronchitis sinus and respiratory symptoms.  She recollects at that time she did have skin allergy testing which was positive for certain pollen but otherwise has been allergy free and symptom free for all these years.  Then most recently in June 2018 had back surgery and following this in July 2018 started developing insidious onset of chronic cough associated with sinus congestion that has persisted and progressively worsened since then.  RSI cough score is 29 indicating severe cough.  Her voice gets hoarse.  Symptoms are associated with wheezing and chest tightness particularly in the early hours between 2 AM and 4 AM.  She does wake up in the middle of the night from her sleep with the symptoms.  She feels as though there is something in her airways and she has to cough hard and will bring up small amount of sputum that is discolored slightly yellow.  She has had 3 course of antibiotics without any relief.  She has never been on steroids [only time she was was on methylprednisolone for autoimmune hepatitis many years ago].  She does have albuterol for rescue that seems to help in the short-term.  She was given a Brio inhaler by her primary care  physician several weeks ago but she only used it for a few days and gave up because of lack of relief.  She feels her sinuses are clogged up.  She is not on any nasal spray.  Review of the lab shows clear chest x-ray.  There is an escalating eosinophil count as documented below and a positive exam nitric oxide test    Dr Lorenza Cambridge Reflux Symptom Index (> 13-15 suggestive of LPR cough) 04/06/2017   Hoarseness of problem with voice 3  Clearing  Of Throat 3  Excess throat mucus or feeling of post nasal drip 4  Difficulty swallowing food, liquid or tablets 1  Cough after eating or lying down 5  Breathing difficulties or choking episodes 4  Troublesome or annoying cough 5  Sensation of something sticking in throat or lump in throat 4  Heartburn, chest pain, indigestion, or stomach acid coming up 0  TOTAL 29    +CXR 03/09/17 - personally visualized and clear - to me and radiology  Results for AKEIA, PEROT (MRN [id number]) as of 04/06/2017 10:16  Ref. Range 01/03/2012 07:27 12/03/2012 15:43 07/12/2016 14:13 10/18/2016 10:50 01/30/2017 10:14  Eosinophils Absolute Latest Ref Range: 15 - 500 cells/uL 0.2 0.2 245  455  Results for JANEEN, WATSON (MRN [id number]) as of 04/06/2017 10:16  Ref. Range 01/03/2012 07:27 12/03/2012  15:43 07/12/2016 14:13 10/18/2016 10:50 01/30/2017 10:14  Eosinophil Latest Units: % 2 4 7   11.1   FeNO 78 ppb 04/06/2017     has a past medical history of Asthma, Cancer ([place]), Complication of anesthesia, Coronary artery disease, Depression, Diabetes mellitus without complication ([place]), Heart murmur, Hepatitis, History of kidney stones, History of nuclear stress test (05/21/2010), Hyperlipidemia, Hypertension, Liver failure ([place]), Myocardial infarction ([place]), Neuropathy, Obesity, PONV (postoperative nausea and vomiting), Psoriasis, and Tubular adenoma of colon.   reports that  has never smoked. she has never used smokeless tobacco.  Past Surgical History:  Procedure Laterality  Date  . Carotid Doppler  10/2008   R & L ICAs 0-49% diameter reduction   . COLONOSCOPY    . CORONARY ANGIOPLASTY WITH STENT PLACEMENT  09/2008   2.25x76mm Cypher DES to in-stent restenosis of prox LAD; Mini-Vision stent x2 2.0x82mm to area distal of initial LAD stent; 3 2.5x22mm Taxus stents prox to Cypher stent in circumflex  . CORONARY ANGIOPLASTY WITH STENT PLACEMENT  09/2005   Cypher DES 3.0x64mm to distal RCA; 2.25x56mm Mini0Vision stent to mid LAD; 2.5x21mm Cypher stent to circumflex  . LUMBAR LAMINECTOMY/DECOMPRESSION MICRODISCECTOMY Left 10/18/2016   Procedure: Left Lumbar Four-Five Microdiscectomy;  Surgeon: Erline Levine, MD;  Location: [facility];  Service: Neurosurgery;  Laterality: Left;  Left L4-5 Microdiscectomy  . ROTATOR CUFF REPAIR Left   . SHOULDER ARTHROSCOPY W/ ROTATOR CUFF REPAIR Right   . TRANSTHORACIC ECHOCARDIOGRAM  04/2010   EF=>55%, borderline conc LVH; trace MR & TR; AV mildly sclerotic; aortic root sclerosis/calcif  . ureteral stents      Allergies  Allergen Reactions  . Penicillins Hives     PATIENT HAD A PCN REACTION WITH IMMEDIATE RASH, FACIAL/TONGUE/THROAT SWELLING, SOB, OR LIGHTHEADEDNESS WITH HYPOTENSION:  #  #  #  YES  #  #  #   Has patient had a PCN reaction causing severe rash involving mucus membranes or skin necrosis:Unknown Has patient had a PCN reaction that required hospitalization:No Has patient had a PCN reaction occurring within the last 10 years:No If all of the above answers are "NO", then may proceed with Cephalosporin use.     Immunization History  Administered Date(s) Administered  . DTaP 08/23/1991, 11/07/2001  . Hepatitis B 02/24/1999, 04/14/1999, 07/25/1999  . Influenza Split 01/19/2013  . Influenza,inj,Quad PF,6+ Mos 01/13/2014, 02/10/2015, 01/30/2017  . Influenza-Unspecified 02/22/2016  . PPD Test 11/17/1993  . Pneumococcal Conjugate-13 07/12/2016  . Pneumococcal Polysaccharide-23 06/03/1998  . Tdap 09/15/2006, 07/12/2016     Family History  Problem Relation Age of Onset  . Diabetes Brother      Current Outpatient Medications:  .  albuterol (PROVENTIL HFA;VENTOLIN HFA) 108 (90 Base) MCG/ACT inhaler, Inhale 2 puffs into the lungs every 6 (six) hours as needed for wheezing or shortness of breath., Disp: 1 Inhaler, Rfl: 2 .  Ascorbic Acid (VITAMIN C PO), Take 1 tablet by mouth daily., Disp: , Rfl:  .  aspirin EC 81 MG tablet, Take 81 mg by mouth at bedtime., Disp: , Rfl:  .  Coenzyme Q10 300 MG CAPS, Take 300 mg by mouth daily., Disp: , Rfl:  .  Cranberry (SM CRANBERRY) 300 MG tablet, Take 300 mg by mouth every evening., Disp: , Rfl:  .  ezetimibe (ZETIA) 10 MG tablet, Take 1 tablet (10 mg total) by mouth daily., Disp: 90 tablet, Rfl: 3 .  fenofibrate (TRICOR) 145 MG tablet, Take 1 tablet (145 mg total) by mouth daily., Disp: 90 tablet, Rfl: 3 .  glucose blood test strip, Pt uses relion meter- test bid for glucose montioring E11.9 Please dispense relion test strips., Disp: 100 each, Rfl: 12 .  Insulin Detemir (LEVEMIR FLEXPEN) 100 UNIT/ML Pen, Inject 66 Units into the skin daily at 10 pm. Patient is to go up 2 units every two days till morning reading is 120 (Patient taking differently: Inject 80 Units into the skin daily at 10 pm. Patient is to go up 2 units every two days till morning reading is 120), Disp: 30 mL, Rfl: 11 .  Insulin Pen Needle (BD PEN NEEDLE NANO U/F) 32G X 4 MM MISC, Use as directed for insulin administration., Disp: 100 each, Rfl: 1 .  isosorbide mononitrate (IMDUR) 30 MG 24 hr tablet, Take 1 tablet (30 mg total) by mouth daily., Disp: 90 tablet, Rfl: 3 .  Krill Oil 500 MG CAPS, Take 500 mg by mouth every evening., Disp: , Rfl:  .  metFORMIN (GLUCOPHAGE) 500 MG tablet, Take 1 tablet (500 mg total) by mouth 2 (two) times daily with a meal., Disp: 180 tablet, Rfl: 3 .  metoprolol succinate (TOPROL-XL) 25 MG 24 hr tablet, Take 1.5 tablets (37.5 mg total) by mouth daily., Disp: 135 tablet, Rfl:  3 .  Multiple Vitamin (MULTIVITAMIN WITH MINERALS) TABS, Take 1 tablet by mouth daily., Disp: , Rfl:  .  Omega-3 Fatty Acids (FISH OIL) 1000 MG CAPS, Take 1,000-2,000 mg by mouth 2 (two) times daily. 1000 mg in the morning & 2000 mg in the evening, Disp: , Rfl:  .  ranolazine (RANEXA) 1000 MG SR tablet, Take 1 tablet (1,000 mg total) by mouth 2 (two) times daily., Disp: 180 tablet, Rfl: 3 .  RELION LANCETS THIN 26G MISC, 1 Units by Does not apply route 2 (two) times daily. E 11.9, Disp: 100 each, Rfl: 12 .  rosuvastatin (CRESTOR) 20 MG tablet, Take 1 tablet (20 mg total) by mouth daily., Disp: 90 tablet, Rfl: 3 .  valsartan (DIOVAN) 160 MG tablet, Take 1 tablet (160 mg total) by mouth daily., Disp: 90 tablet, Rfl: 3 .  NITROSTAT 0.4 MG SL tablet, DISSOLVE 1 TAB UNDER THE TONGUE EVERY 5 MIN AS NEEDED FOR CHEST PAIN (MAX OF 3) (Patient not taking: Reported on 04/06/2017), Disp: 25 tablet, Rfl: 1     Review of Systems  Constitutional: Negative for fever and unexpected weight change.  HENT: Positive for congestion, postnasal drip and sneezing. Negative for dental problem, ear pain, rhinorrhea, sinus pressure, sore throat and trouble swallowing.   Eyes: Negative for redness and itching.  Respiratory: Positive for cough and wheezing. Negative for chest tightness and shortness of breath.   Cardiovascular: Negative for palpitations and leg swelling.  Gastrointestinal: Negative for nausea and vomiting.  Genitourinary: Negative for dysuria.  Musculoskeletal: Negative for joint swelling.  Skin: Negative for rash.  Allergic/Immunologic: Negative.  Negative for environmental allergies, food allergies and immunocompromised state.  Neurological: Negative for headaches.  Hematological: Does not bruise/bleed easily.  Psychiatric/Behavioral: Negative for dysphoric mood. The patient is not nervous/anxious.        Objective:   Physical Exam  Constitutional: She is oriented to person, place, and time. She  appears well-developed and well-nourished. No distress.  HENT:  Head: Normocephalic and atraumatic.  Right Ear: External ear normal.  Left Ear: External ear normal.  Mouth/Throat: Oropharynx is clear and moist. No oropharyngeal exudate.  Nasal twang of sinus congestion  Eyes: Conjunctivae and EOM are normal. Pupils are equal, round, and reactive to light. Right  eye exhibits no discharge. Left eye exhibits no discharge. No scleral icterus.  Neck: Normal range of motion. Neck supple. No JVD present. No tracheal deviation present. No thyromegaly present.  Cardiovascular: Normal rate, regular rhythm, normal heart sounds and intact distal pulses. Exam reveals no gallop and no friction rub.  No murmur heard. Pulmonary/Chest: Effort normal. No respiratory distress. She has wheezes. She has no rales. She exhibits no tenderness.  Bilateral extensive wheezing  Abdominal: Soft. Bowel sounds are normal. She exhibits no distension and no mass. There is no tenderness. There is no rebound and no guarding.  Musculoskeletal: Normal range of motion. She exhibits no edema or tenderness.  Lymphadenopathy:    She has no cervical adenopathy.  Neurological: She is alert and oriented to person, place, and time. She has normal reflexes. No cranial nerve deficit. She exhibits normal muscle tone. Coordination normal.  Skin: Skin is warm and dry. No rash noted. She is not diaphoretic. No erythema. No pallor.  Psychiatric: She has a normal mood and affect. Her behavior is normal. Judgment and thought content normal.  Vitals reviewed.   Vitals:   04/06/17 1004  BP: 128/78  Pulse: 79  SpO2: 95%        Assessment & Plan:     ICD-10-CM   1. 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 Chronic sinusitis, unspecified location J32.9       Do blood IgE, allergy test and cbc with diff  Please take Take prednisone  40mg  once daily x 3 days, then 30mg  once daily x 3 days, then 20mg  once daily x 3 days, then prednisone 10mg  once daily  x 3 days and stop  Start breo high dose daily   take generic fluticasone inhaler 2 squirts each nostril daily   Use albuterol as needed  Followup - in 4-6 weeks with Dr Chase Caller - call sooner if above not working or go to ER esp if worse - at followup depending on response can discuss CT sinus and othe tests if needed  - cough score and feno at followup   Dr. Brand Males, M.D., [facility].C.P Pulmonary and Critical Care Medicine Staff Physician, [place] Director - Interstitial Lung Disease  Program  Pulmonary [place] at [place], [place], [address]  Pager: [phone], If no answer or between  15:00h - 7:00h: call [phone] Telephone: [phone]

## 2017-04-06 NOTE — Addendum Note (Signed)
Addended by: Lorretta Harp on: 04/06/2017 11:05 AM   Modules accepted: Orders

## 2017-04-06 NOTE — Patient Instructions (Addendum)
ICD-10-CM   1. Asthma with acute exacerbation, unspecified asthma severity, unspecified whether persistent J45.901   2. 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 Chronic sinusitis, unspecified location J32.9     Do blood IgE, allergy test and cbc with diff  Please take Take prednisone 40mg  once daily x 3 days, then 30mg  once daily x 3 days, then 20mg  once daily x 3 days, then prednisone 10mg  once daily  x 3 days and stop  Start breo high dose daily   take generic fluticasone inhaler 2 squirts each nostril daily   Use albuterol as needed  Followup - in 4-6 weeks with Dr Chase Caller - call sooner if above not working or go to ER esp if worse - at followup depending on response can discuss CT sinus and othe tests if needed  - cough score and feno at followup

## 2017-04-07 LAB — RESPIRATORY ALLERGY PROFILE REGION II ~~LOC~~
ALLERGEN, COMM SILVER BIRCH, T3: 0.13 kU/L — AB
Allergen, Cedar tree, t12: 0.16 kU/L — ABNORMAL HIGH
Allergen, Cottonwood, t14: 0.39 kU/L — ABNORMAL HIGH
Allergen, D pternoyssinus,d7: <0.1 kU/L
Allergen, Mouse Urine Protein, e78: <0.1 kU/L
Allergen, Mulberry, t76: <0.1 kU/L
Allergen, Oak,t7: 0.21 kU/L — ABNORMAL HIGH
Allergen, P. notatum, m1: 1.47 kU/L — ABNORMAL HIGH
Aspergillus fumigatus, m3: 0.15 kU/L — ABNORMAL HIGH
BERMUDA GRASS: 0.19 kU/L — AB
BOX ELDER: 0.28 kU/L — AB
CLASS: 0
CLASS: 0
CLASS: 0
CLASS: 0
CLASS: 0
CLASS: 0
CLASS: 0
CLASS: 1
COMMON RAGWEED (SHORT) (W1) IGE: 0.16 kU/L — AB
Cat Dander: <0.1 kU/L
Class: 0
Class: 0
Class: 0
Class: 0
Class: 0
Class: 0
Class: 0
Class: 0
Class: 0
Class: 0
Class: 0
Class: 0
Class: 0
Class: 0
Class: 1
Class: 2
Cockroach: 0.13 kU/L — ABNORMAL HIGH
Dog Dander: <0.1 kU/L
ELM IGE: 0.51 kU/L — AB
IGE (IMMUNOGLOBULIN E), SERUM: 621 kU/L — AB (ref <=114)
JOHNSON GRASS: 0.17 kU/L — AB
Rough Pigweed  IgE: 0.19 kU/L — ABNORMAL HIGH
SHEEP SORREL IGE: 0.19 kU/L — AB
Timothy Grass: 0.17 kU/L — ABNORMAL HIGH

## 2017-04-07 LAB — INTERPRETATION:

## 2017-04-13 ENCOUNTER — Telehealth: Payer: Self-pay | Admitting: Internal Medicine

## 2017-04-13 NOTE — Telephone Encounter (Signed)
Brand Males, MD  Pinion, Waldemar Dickens, CMA        Let Phylliss Bob know that blood allergy panel positive for several allergerns and IgE marker of airway inflmation bcuase of allergies is high. Want to see how she does with breo before we decide on step up Rx    Pt is aware of results and voiced her understand. Pt is requesting names of allergens.  MR please advise. Thanks.

## 2017-04-14 NOTE — Telephone Encounter (Signed)
Results for Haley George, Haley George (MRN [id number]) as of 04/14/2017 15:05  Ref. Range 04/06/2017 11:17  Sheep Sorrel IgE Latest Units: kU/L 0.19 (H)  Pecan/Hickory Tree IgE Latest Units: kU/L <0.10  IgE (Immunoglobulin E), Serum Latest Ref Range: <OR=114 kU/L 621 (H)  Allergen, D pternoyssinus,d7 Latest Units: kU/L <0.10  Cat Dander Latest Units: kU/L <0.10  Dog Dander Latest Units: kU/L <0.10  [place] Grass Latest Units: kU/L 0.19 (H)  Johnson Grass Latest Units: kU/L 0.17 (H)  Timothy Grass Latest Units: kU/L 0.17 (H)  Cockroach Latest Units: kU/L 0.13 (H)  Elm IgE Latest Units: kU/L 0.51 (H)  COMMON RAGWEED (SHORT) (W1) IGE Latest Units: kU/L 0.16 (H)  D. farinae Latest Units: kU/L <0.10  Box Elder IgE Latest Units: kU/L 0.28 (H)  Rough Pigweed  IgE Latest Units: kU/L 0.19 (H)    She is positive for everything in red. If she wants better understanding of this and implications in management a) avoid grass and gardenng work; avoid roaches and then consider allergy referral.   Dr. Brand Males, M.D., [facility].C.P Pulmonary and Critical Care Medicine Staff Physician, [place] Director - Interstitial Lung Disease  Program  Pulmonary [place] at [place], [place], [address]  Pager: [phone], If no answer or between  15:00h - 7:00h: call [phone] Telephone: [phone]  '

## 2017-04-14 NOTE — Telephone Encounter (Signed)
lmtcb x1 for pt. 

## 2017-04-17 NOTE — Telephone Encounter (Signed)
Called and spoke with pt letting her know everything that she was allergic to. Told pt that MR would go over everything in further detail with her at her OV to see what the next steps would be. Nothing further needed.

## 2017-05-05 ENCOUNTER — Ambulatory Visit: Payer: BC Managed Care – PPO | Admitting: Internal Medicine

## 2017-05-19 ENCOUNTER — Ambulatory Visit: Payer: BC Managed Care – PPO | Admitting: Internal Medicine

## 2017-05-19 ENCOUNTER — Encounter: Payer: Self-pay | Admitting: Internal Medicine

## 2017-05-19 VITALS — BP 116/64 | HR 88 | Ht 65.0 in | Wt 197.8 lb

## 2017-05-19 DIAGNOSIS — R768 Other specified abnormal immunological findings in serum: Secondary | ICD-10-CM

## 2017-05-19 DIAGNOSIS — J4540 Moderate persistent asthma, uncomplicated: Secondary | ICD-10-CM | POA: Diagnosis not present

## 2017-05-19 DIAGNOSIS — J82 Pulmonary eosinophilia, not elsewhere classified: Secondary | ICD-10-CM

## 2017-05-19 DIAGNOSIS — J8283 Eosinophilic asthma: Secondary | ICD-10-CM

## 2017-05-19 NOTE — Progress Notes (Signed)
Subjective:     Patient ID: Haley George, female   DOB: [birth year], 63 y.o.   MRN: [id number]  HPI  PCP Denita Lung, MD  HPI   IOV 04/06/2017  Chief Complaint  Patient presents with  . Advice Only    Referred by Dr. Redmond School.  Pt has had a cough with congestion since early August. Pt been on multiple abx which has not helped the cough. Pt states cough is worse at night when lying down and has wheezing involved.       62 year old female.  She has been retired for 3 years.  She does some public speaking.  She is a non-smoker.  She lives in a 61 year old house with mixed carpet and wooden floors.  She has a indoor outdoor cat from many years.  She has no family history of asthma.  She has a personal history of possible asthma 1983 when she developed post bronchitis sinus and respiratory symptoms.  She recollects at that time she did have skin allergy testing which was positive for certain pollen but otherwise has been allergy free and symptom free for all these years.  Then most recently in June 2018 had back surgery and following this in July 2018 started developing insidious onset of chronic cough associated with sinus congestion that has persisted and progressively worsened since then.  RSI cough score is 29 indicating severe cough.  Her voice gets hoarse.  Symptoms are associated with wheezing and chest tightness particularly in the early hours between 2 AM and 4 AM.  She does wake up in the middle of the night from her sleep with the symptoms.  She feels as though there is something in her airways and she has to cough hard and will bring up small amount of sputum that is discolored slightly yellow.  She has had 3 course of antibiotics without any relief.  She has never been on steroids [only time she was was on methylprednisolone for autoimmune hepatitis many years ago].  She does have albuterol for rescue that seems to help in the short-term.  She was given a Brio inhaler by her primary  care physician several weeks ago but she only used it for a few days and gave up because of lack of relief.  She feels her sinuses are clogged up.  She is not on any nasal spray.  Review of the lab shows clear chest x-ray.  There is an escalating eosinophil count as documented below and a positive exam nitric oxide test  OV 05/19/2017  Chief Complaint  Patient presents with  . Follow-up    Pt states she has been doing much better since last visit. States she is hardly coughing and denies any complaints of SOB or CP.    62 year old female with a new diagnosis of cough variant asthma allergic phenotype.  Since her last visit she was started on prednisone and Brio.  After that she feels completely fine.  She still has some postnasal drip which she has is a because of cardiac medications.  RSI cough score is dropped to 4 from 29.  She feels great.  She has questions about her positive allergy panel.  At this point in time she wants to hold off on referral to an allergist.  She wants to adopt a wait and watch approach.  She continues with Brio  Dr Lorenza Cambridge Reflux Symptom Index (> 13-15 suggestive of LPR cough) 04/06/2017 feno 78 05/19/2017 feno 47  Hoarseness of problem with  voice 3 0  Clearing  Of Throat 3 1  Excess throat mucus or feeling of post nasal drip 4 3  Difficulty swallowing food, liquid or tablets 1 0  Cough after eating or lying down 5 0  Breathing difficulties or choking episodes 4 0  Troublesome or annoying cough 5 0  Sensation of something sticking in throat or lump in throat 4 0  Heartburn, chest pain, indigestion, or stomach acid coming up 0 0  TOTAL 29 4      Ref. Range 04/06/2017 11:17  Sheep Sorrel IgE Latest Units: kU/L 0.19 (H)  Pecan/Hickory Tree IgE Latest Units: kU/L <0.10  IgE (Immunoglobulin E), Serum Latest Ref Range: <OR=114 kU/L 621 (H)  Allergen, D pternoyssinus,d7 Latest Units: kU/L <0.10  Cat Dander Latest Units: kU/L <0.10  Dog Dander Latest Units:  kU/L <0.10  [place] Grass Latest Units: kU/L 0.19 (H)  Johnson Grass Latest Units: kU/L 0.17 (H)  Timothy Grass Latest Units: kU/L 0.17 (H)  Cockroach Latest Units: kU/L 0.13 (H)  Elm IgE Latest Units: kU/L 0.51 (H)  COMMON RAGWEED (SHORT) (W1) IGE Latest Units: kU/L 0.16 (H)  D. farinae Latest Units: kU/L <0.10  Box Elder IgE Latest Units: kU/L 0.28 (H)  Rough Pigweed  IgE Latest Units: kU/L 0.19 (H)    Results for Haley George, Haley George (MRN [id number]) as of 05/19/2017 11:50    Ref. Range 04/06/2017 11:17  IgE (Immunoglobulin E), Serum Latest Ref Range: <OR=114 kU/L 621 (H)     +CXR 03/09/17 - personally visualized and clear - to me and radiology  Results for Haley George, Haley George (MRN [id number]) as of 04/06/2017 10:16  Ref. Range 01/03/2012 07:27 12/03/2012 15:43 07/12/2016 14:13 10/18/2016 10:50 01/30/2017 10:14  Eosinophils Absolute Latest Ref Range: 15 - 500 cells/uL 0.2 0.2 245  455  Results for Haley George, Haley George (MRN [id number]) as of 04/06/2017 10:16  Ref. Range 01/03/2012 07:27 12/03/2012 15:43 07/12/2016 14:13 10/18/2016 10:50 01/30/2017 10:14  Eosinophil Latest Units: % 2 4 7   11.1   FeNO 78 ppb 04/06/2017    Review of Systems     Objective:   Physical Exam  Constitutional: She is oriented to person, place, and time. She appears well-developed and well-nourished. No distress.  HENT:  Head: Normocephalic and atraumatic.  Right Ear: External ear normal.  Left Ear: External ear normal.  Mouth/Throat: Oropharynx is clear and moist. No oropharyngeal exudate.  Eyes: Conjunctivae and EOM are normal. Pupils are equal, round, and reactive to light. Right eye exhibits no discharge. Left eye exhibits no discharge. No scleral icterus.  Neck: Normal range of motion. Neck supple. No JVD present. No tracheal deviation present. No thyromegaly present.  Cardiovascular: Normal rate, regular rhythm, normal heart sounds and intact distal pulses. Exam reveals no gallop and no friction rub.  No murmur  heard. Pulmonary/Chest: Effort normal and breath sounds normal. No respiratory distress. She has no wheezes. She has no rales. She exhibits no tenderness.  Abdominal: Soft. Bowel sounds are normal. She exhibits no distension and no mass. There is no tenderness. There is no rebound and no guarding.  Musculoskeletal: Normal range of motion. She exhibits no edema or tenderness.  Lymphadenopathy:    She has no cervical adenopathy.  Neurological: She is alert and oriented to person, place, and time. She has normal reflexes. No cranial nerve deficit. She exhibits normal muscle tone. Coordination normal.  Skin: Skin is warm and dry. No rash noted. She is not diaphoretic. No erythema. No pallor.  Psychiatric:  She has a normal mood and affect. Her behavior is normal. Judgment and thought content normal.  Vitals reviewed.  Vitals:   05/19/17 1126  BP: 116/64  Pulse: 88  SpO2: 96%  Weight: 197 lb 12.8 oz (89.7 kg)  Height: 5\' 5"  (1.651 m)    Estimated body mass index is 32.92 kg/m as calculated from the following:   Height as of this encounter: 5\' 5"  (1.651 m).   Weight as of this encounter: 197 lb 12.8 oz (89.7 kg).     Assessment:       ICD-10-CM   1. 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 Elevated IgE level R76.8   s     Plan:       Glad you are better  Plan Continue breo scheduled and as needed flonase Use albuterol as needed Use mask with lawn mowing Allergy referral if things get out of control; till then hold off   Followup 6 months followup or sooner if needed; FeNO at followup    Dr. Brand Males, M.D., [facility].C.P Pulmonary and Critical Care Medicine Staff Physician, [place] Director - Interstitial Lung Disease  Program  Pulmonary [place] at [place], [place], [address]  Pager: [phone], If no answer or between  15:00h - 7:00h:  call [phone] Telephone: [phone]

## 2017-05-19 NOTE — Patient Instructions (Signed)
ICD-10-CM   1. 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 Moderate persistent extrinsic asthma without complication Q22.41   3. Elevated IgE level R76.8     Glad you are better  Plan Continue breo scheduled and as needed flonase Use albuterol as needed Use mask with lawn mowing Allergy referral if things get out of control; till then hold off   Followup 6 months followup or sooner if needed; FeNO at followup

## 2017-06-21 ENCOUNTER — Telehealth: Payer: Self-pay | Admitting: Internal Medicine

## 2017-06-21 MED ORDER — PREDNISONE 10 MG PO TABS: ORAL_TABLET | ORAL | 0 refills | Status: DC

## 2017-06-21 MED ORDER — DOXYCYCLINE HYCLATE 100 MG PO TABS: 100.0000 mg | ORAL_TABLET | Freq: Two times a day (BID) | ORAL | 0 refills | Status: DC

## 2017-06-21 NOTE — Telephone Encounter (Signed)
Spoke with patient. She stated that last week, she started wheezing again. Now she has a productive cough that is semi-productive cough with white, greenish phlegm. She described the cough as a deep cough. She denies any symptoms of fever or body aches. Also denied any SOB.   She wants to know if MR would prescribe another round of prednisone for her.   She wishes to use [facility] on [address].   MR, please advise. Thanks!

## 2017-06-21 NOTE — Telephone Encounter (Signed)
Do   Take prednisone 40 mg daily x 2 days, then 20mg  daily x 2 days, then 10mg  daily x 2 days, then 5mg  daily x 2 days and stop   And also  Take doxycycline 100mg  po twice daily x 5 days; take after meals and avoid sunlight

## 2017-06-21 NOTE — Telephone Encounter (Signed)
Spoke with pt. She is aware of Dr. Golden Pop recommendation. Rxs have been sent in. Nothing further was needed.

## 2017-07-18 ENCOUNTER — Ambulatory Visit: Payer: BC Managed Care – PPO | Admitting: Family Medicine

## 2017-08-02 ENCOUNTER — Telehealth: Payer: Self-pay | Admitting: Internal Medicine

## 2017-08-02 MED ORDER — AZITHROMYCIN 250 MG PO TABS: ORAL_TABLET | ORAL | 0 refills | Status: DC

## 2017-08-02 MED ORDER — PREDNISONE 10 MG PO TABS: ORAL_TABLET | ORAL | 0 refills | Status: DC

## 2017-08-02 NOTE — Telephone Encounter (Signed)
Called and spoke to patient. Patient stated that she has been coughing for 2 weeks now, productive -phlegm is light yellow per patient. She stated that her throat is hurting from coughing so much. She thinks this is asthma and allergy related. Reports she is taking her Breo as prescribed and is using her rescue inhaler at least daily. Patient stated at night she is wheezing more and between the wheeze and cough she isn't getting much sleep. Patient denies any fever. Patient also reports she is not taking an antihistamine for allergies. Patient reported that the previous Rx for prednisone helped her a lot.  MR please advise.

## 2017-08-02 NOTE — Telephone Encounter (Signed)
Probably the pollen load  Take prednisone 40 mg daily x 2 days, then 20mg  daily x 2 days, then 10mg  daily x 2 days, then 5mg  daily x 2 days and stop   Z Pak   Dr. Brand Males, M.D., [facility].C.P Pulmonary and Critical Care Medicine Staff Physician, [place] Director - Interstitial Lung Disease  Program  Pulmonary [place] [facility] [place], [place], [address]  Pager: [phone], If no answer or between  15:00h - 7:00h: call [phone] Telephone: [phone]

## 2017-08-02 NOTE — Telephone Encounter (Signed)
Spoke with patient. She is aware of MR's recommendations. Medications have been sent into pharmacy.   Nothing else needed at time of call.

## 2017-08-03 ENCOUNTER — Ambulatory Visit: Payer: BC Managed Care – PPO | Admitting: Family Medicine

## 2017-08-07 ENCOUNTER — Ambulatory Visit: Payer: BC Managed Care – PPO | Admitting: Family Medicine

## 2017-08-07 ENCOUNTER — Encounter: Payer: Self-pay | Admitting: Family Medicine

## 2017-08-07 VITALS — BP 146/88 | HR 72 | Temp 97.9°F | Ht 64.5 in | Wt 194.8 lb

## 2017-08-07 DIAGNOSIS — E785 Hyperlipidemia, unspecified: Secondary | ICD-10-CM

## 2017-08-07 DIAGNOSIS — I10 Essential (primary) hypertension: Secondary | ICD-10-CM

## 2017-08-07 DIAGNOSIS — E1159 Type 2 diabetes mellitus with other circulatory complications: Secondary | ICD-10-CM

## 2017-08-07 DIAGNOSIS — E669 Obesity, unspecified: Secondary | ICD-10-CM | POA: Diagnosis not present

## 2017-08-07 DIAGNOSIS — E1169 Type 2 diabetes mellitus with other specified complication: Secondary | ICD-10-CM

## 2017-08-07 DIAGNOSIS — J4520 Mild intermittent asthma, uncomplicated: Secondary | ICD-10-CM

## 2017-08-07 DIAGNOSIS — Z9889 Other specified postprocedural states: Secondary | ICD-10-CM | POA: Diagnosis not present

## 2017-08-07 DIAGNOSIS — E118 Type 2 diabetes mellitus with unspecified complications: Secondary | ICD-10-CM | POA: Diagnosis not present

## 2017-08-07 DIAGNOSIS — I2510 Atherosclerotic heart disease of native coronary artery without angina pectoris: Secondary | ICD-10-CM

## 2017-08-07 LAB — POCT URINALYSIS DIP (PROADVANTAGE DEVICE)
BILIRUBIN UA: NEGATIVE
BILIRUBIN UA: NEGATIVE mg/dL
Glucose, UA: NEGATIVE mg/dL
LEUKOCYTES UA: NEGATIVE
Nitrite, UA: NEGATIVE
PH UA: 6 (ref 5.0–8.0)
Protein Ur, POC: NEGATIVE mg/dL
RBC UA: NEGATIVE
SPECIFIC GRAVITY, URINE: 1.025
Urobilinogen, Ur: 3.5

## 2017-08-07 NOTE — Progress Notes (Signed)
  Subjective:    Patient ID: Haley George, female    DOB: Oct 12, 1955, 62 y.o.   MRN: [id number]  Haley George is a 62 y.o. female who presents for follow-up of Type 2 diabetes mellitus.  Patient is checking home blood sugars.   Home blood sugar records: BGs are running  consistent with Hgb A1C How often is blood sugars being checked: Daily Current symptoms/problems include none and have been unchanged. Daily foot checks: Yes   any foot concerns: No Last eye exam: In the last year Exercise: Home exercise routine includes yoga and Walking. She continues on insulin at 80 units.  She has noted some weight loss within the last several months.  She continues on metformin 500 mg twice daily.  Her asthma seems to be under good control on her present medication regimen.  She continues to be followed by cardiology and is on appropriate medications.  Continues on Crestor.  She had back surgery in June of last year and is much more physically mobile. The following portions of the patient's history were reviewed and updated as appropriate: allergies, current medications, past medical history, past social history and problem list.  ROS as in subjective above.     Objective:    Physical Exam Alert and in no distress otherwise not examined.  Blood pressure (!) 146/88, pulse 72, temperature 97.9 F (36.6 C), height 5' 4.5" (1.638 m), weight 194 lb 12.8 oz (88.4 kg).  Lab Review Diabetic Labs Latest Ref Rng & Units 03/13/2017 01/30/2017 10/18/2016 07/12/2016 02/10/2015  HbA1c - 7.3 - 10.5(H) 7.1 6.8  Microalbumin mg/L 5.9 - - 125.4 -  Micro/Creat Ratio - 18.3 - - 58.3 -  Chol <200 mg/dL - - - 175 -  HDL >50 mg/dL - - - 34(L) -  Calc LDL <100 mg/dL - - - 91 -  Triglycerides <150 mg/dL - - - 249(H) -  Creatinine 0.50 - 1.05 mg/dL - 1.18(H) 0.99 1.04 -   BP/Weight 08/07/2017 05/19/2017 04/06/2017 03/13/2017 17/61/6073  Systolic BP 710 626 948 546 270  Diastolic BP 88 64 78 80 84  Wt. (Lbs) 194.8  197.8 201.8 205.6 210  BMI 32.92 32.92 33.58 34.21 34.95   Foot/eye exam completion dates Latest Ref Rng & Units 02/12/2016 12/23/2014  Eye Exam No Retinopathy No Retinopathy No Retinopathy  Foot Form Completion - - -   A1c is 7.1 [name]  reports that she has never smoked. She has never used smokeless tobacco. She reports that she does not drink alcohol or use drugs.     Assessment & Plan:    Diabetes mellitus with complication (HCC)  Mild intermittent asthmatic bronchitis without complication  Obesity (BMI 30-39.9)  Hypertension associated with diabetes ([place])  Hyperlipidemia associated with type 2 diabetes mellitus ([place])  ASHD (arteriosclerotic heart disease)  History of back surgery   1. Rx changes: none   2. Hopefully with increased weight loss, her insulin requirements will diminish.  Education: Reviewed 'ABCs' of diabetes management (respective goals in parentheses):  A1C (<7), blood pressure (<130/80), and cholesterol (LDL <100). 3. Compleance at present is estimated to be good. Efforts to improve compliance (if necessary) will be directed at increased exercise.  She was also continue with her diet which is low in carbohydrates. 4. Follow up: 4 months

## 2017-08-07 NOTE — Addendum Note (Signed)
Addended by: Elyse Jarvis on: 08/07/2017 02:41 PM   Modules accepted: Orders

## 2017-08-08 ENCOUNTER — Telehealth: Payer: Self-pay | Admitting: Family Medicine

## 2017-08-08 NOTE — Telephone Encounter (Signed)
ok 

## 2017-08-08 NOTE — Telephone Encounter (Signed)
Pt requesting note on rx pad or letter stating she should have exercise 2-4 times a week (or whatever Dr Redmond School suggest) at the [facility] due to her diabetes and high blood pressure so that she can get reimbursed for [facility] fees through her flex account

## 2017-08-09 ENCOUNTER — Encounter: Payer: Self-pay | Admitting: Family Medicine

## 2017-08-09 NOTE — Telephone Encounter (Signed)
Letter typed and signed and ready for pick up.  Pt informed

## 2017-08-11 ENCOUNTER — Other Ambulatory Visit: Payer: Self-pay | Admitting: Medical

## 2017-09-10 ENCOUNTER — Other Ambulatory Visit: Payer: Self-pay | Admitting: Family Medicine

## 2017-09-10 DIAGNOSIS — E118 Type 2 diabetes mellitus with unspecified complications: Secondary | ICD-10-CM

## 2017-11-20 ENCOUNTER — Ambulatory Visit: Payer: BC Managed Care – PPO | Admitting: Family Medicine

## 2017-11-20 ENCOUNTER — Encounter: Payer: Self-pay | Admitting: Family Medicine

## 2017-11-20 VITALS — BP 132/78 | HR 70 | Temp 98.0°F | Wt 194.0 lb

## 2017-11-20 DIAGNOSIS — H9201 Otalgia, right ear: Secondary | ICD-10-CM | POA: Diagnosis not present

## 2017-11-20 NOTE — Progress Notes (Signed)
   Subjective:    Patient ID: Haley George, female    DOB: Jul 29, 1955, 62 y.o.   MRN: [id number]  HPI She complains of a 1 day history of difficulty with right earache, sore throat but no fever, chills, congestion.  She continues have difficulty with cough and is being followed by pulmonary for this.   Review of Systems     Objective:   Physical Exam Alert and in no distress. Tympanic membranes and canals are normal. Pharyngeal area is normal. Neck is supple without adenopathy or thyromegaly. Cardiac exam shows a regular sinus rhythm without murmurs or gallops. Lungs show scattered rhonchi        Assessment & Plan:  Right ear pain Recommend supportive care including using Aleve and Tylenol.  Call if continued difficulty.

## 2017-11-20 NOTE — Patient Instructions (Signed)
Take 2 Aleve twice per day and also take Tylenol with that if you need to

## 2017-12-19 ENCOUNTER — Ambulatory Visit: Payer: BC Managed Care – PPO | Admitting: Family Medicine

## 2017-12-19 ENCOUNTER — Encounter: Payer: Self-pay | Admitting: Family Medicine

## 2017-12-19 VITALS — BP 156/90 | HR 76 | Temp 98.4°F | Ht 65.0 in | Wt 196.6 lb

## 2017-12-19 DIAGNOSIS — I152 Hypertension secondary to endocrine disorders: Secondary | ICD-10-CM

## 2017-12-19 DIAGNOSIS — Z23 Encounter for immunization: Secondary | ICD-10-CM | POA: Diagnosis not present

## 2017-12-19 DIAGNOSIS — J82 Pulmonary eosinophilia, not elsewhere classified: Secondary | ICD-10-CM

## 2017-12-19 DIAGNOSIS — E1169 Type 2 diabetes mellitus with other specified complication: Secondary | ICD-10-CM

## 2017-12-19 DIAGNOSIS — J4530 Mild persistent asthma, uncomplicated: Secondary | ICD-10-CM

## 2017-12-19 DIAGNOSIS — J8283 Eosinophilic asthma: Secondary | ICD-10-CM | POA: Insufficient documentation

## 2017-12-19 DIAGNOSIS — E118 Type 2 diabetes mellitus with unspecified complications: Secondary | ICD-10-CM

## 2017-12-19 DIAGNOSIS — E1159 Type 2 diabetes mellitus with other circulatory complications: Secondary | ICD-10-CM

## 2017-12-19 DIAGNOSIS — E669 Obesity, unspecified: Secondary | ICD-10-CM

## 2017-12-19 DIAGNOSIS — E785 Hyperlipidemia, unspecified: Secondary | ICD-10-CM

## 2017-12-19 DIAGNOSIS — I2510 Atherosclerotic heart disease of native coronary artery without angina pectoris: Secondary | ICD-10-CM

## 2017-12-19 DIAGNOSIS — I10 Essential (primary) hypertension: Secondary | ICD-10-CM

## 2017-12-19 LAB — POCT GLYCOSYLATED HEMOGLOBIN (HGB A1C): Hemoglobin A1C: 9.2 % — AB (ref 4.0–5.6)

## 2017-12-19 NOTE — Progress Notes (Signed)
Subjective:    Patient ID: Haley George, female    DOB: [birth year], 62 y.o.   MRN: [id number]  Haley George is a 62 y.o. female who presents for follow-up of Type 2 diabetes mellitus.  Home blood sugar records: meter records and book.  She has slowly increased her insulin to 90 units.  She states that her morning blood sugars are now in 130 range. Current symptoms/problems include none and have been unchanged. Daily foot checks:yes   Any foot concerns: no Exercise: walking for one hour 5 days/week but stopped this several months ago. Diet: Plans to go back on a low carbohydrate diet. She continues on metformin.  She is also taking insulin as mentioned above at 90 units.  She is supposed to be taking Breo but has stopped that and has had difficulty recently with cough and congestion.  She continues on fenofibrate as well as Crestor.  She is taking isosorbide on a regular basis.  She is also taking metoprolol valsartan and Ranexa.  She has not had chest pain, shortness of breath, PND. The following portions of the patient's history were reviewed and updated as appropriate: allergies, current medications, past medical history, past social history and problem list.  ROS as in subjective above.     Objective:    Physical Exam Alert and in no distress lungs show expiratory wheezing  Lab Review Diabetic Labs Latest Ref Rng & Units 03/13/2017 01/30/2017 10/18/2016 07/12/2016 02/10/2015  HbA1c - 7.3 - 10.5(H) 7.1 6.8  Microalbumin mg/L 5.9 - - 125.4 -  Micro/Creat Ratio - 18.3 - - 58.3 -  Chol <200 mg/dL - - - 175 -  HDL >50 mg/dL - - - 34(L) -  Calc LDL <100 mg/dL - - - 91 -  Triglycerides <150 mg/dL - - - 249(H) -  Creatinine 0.50 - 1.05 mg/dL - 1.18(H) 0.99 1.04 -   BP/Weight 11/20/2017 08/07/2017 05/19/2017 04/06/2017 02/16/8526  Systolic BP 782 423 536 144 315  Diastolic BP 78 88 64 78 80  Wt. (Lbs) 194 194.8 197.8 201.8 205.6  BMI 32.79 32.92 32.92 33.58 34.21   Foot/eye exam  completion dates Latest Ref Rng & Units 02/12/2016 12/23/2014  Eye Exam No Retinopathy No Retinopathy No Retinopathy  Foot Form Completion - - -  A1c is 9.2  Haley George  reports that she has never smoked. She has never used smokeless tobacco. She reports that she does not drink alcohol or use drugs.     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  She can use the albuterol as a rescue.  She will continue to be followed by cardiology.  Discussed her diabetes in terms of possibly referring her.  She plans to get back on a more strict diet and exercise regimen.  She did have her A1c down into the low 7 range previously but does admit to deviating from her diet and exercise regimen.  She will keep her insulin dosing at the present range.  She continues have difficulty, I will refer to endocrinology. 1. Rx  changes: none 2. Education: Reviewed 'ABCs' of diabetes management (respective goals in parentheses):  A1C (<7), blood pressure (<130/80), and cholesterol (LDL <100). 3. Compliance at present is estimated to be poor. Efforts to improve compliance (if necessary) will be directed at increased exercise. 4. Follow up: 4 months

## 2017-12-19 NOTE — Addendum Note (Signed)
Addended by: Elyse Jarvis on: 12/19/2017 02:00 PM   Modules accepted: Orders

## 2018-01-03 ENCOUNTER — Telehealth: Payer: Self-pay

## 2018-01-03 NOTE — Telephone Encounter (Signed)
lmom informing patient that PCP has given the ok to take the Metformin ER.

## 2018-01-03 NOTE — Telephone Encounter (Signed)
Pt called and stated she is outb of her Metformin 500mg  and wants to know if she can use the Metformin ER 90 days supply that her daughter is no longer taking. Stated she does not want to purchase the regular Metformin if she can use the Extended Release. Please Advise.

## 2018-01-03 NOTE — Telephone Encounter (Signed)
yes

## 2018-02-14 ENCOUNTER — Other Ambulatory Visit: Payer: Self-pay | Admitting: Family Medicine

## 2018-03-13 ENCOUNTER — Other Ambulatory Visit: Payer: Self-pay | Admitting: Cardiovascular Disease

## 2018-03-27 ENCOUNTER — Ambulatory Visit: Payer: BC Managed Care – PPO | Admitting: Family Medicine

## 2018-03-27 ENCOUNTER — Encounter: Payer: Self-pay | Admitting: Family Medicine

## 2018-03-27 ENCOUNTER — Other Ambulatory Visit: Payer: Self-pay

## 2018-03-27 VITALS — BP 144/86 | HR 78 | Temp 98.3°F | Ht 65.0 in | Wt 204.2 lb

## 2018-03-27 DIAGNOSIS — E1159 Type 2 diabetes mellitus with other circulatory complications: Secondary | ICD-10-CM

## 2018-03-27 DIAGNOSIS — E118 Type 2 diabetes mellitus with unspecified complications: Secondary | ICD-10-CM

## 2018-03-27 DIAGNOSIS — L409 Psoriasis, unspecified: Secondary | ICD-10-CM | POA: Diagnosis not present

## 2018-03-27 DIAGNOSIS — I10 Essential (primary) hypertension: Secondary | ICD-10-CM

## 2018-03-27 DIAGNOSIS — E669 Obesity, unspecified: Secondary | ICD-10-CM

## 2018-03-27 DIAGNOSIS — E1169 Type 2 diabetes mellitus with other specified complication: Secondary | ICD-10-CM

## 2018-03-27 DIAGNOSIS — E785 Hyperlipidemia, unspecified: Secondary | ICD-10-CM

## 2018-03-27 DIAGNOSIS — Z23 Encounter for immunization: Secondary | ICD-10-CM | POA: Diagnosis not present

## 2018-03-27 DIAGNOSIS — Z9119 Patient's noncompliance with other medical treatment and regimen: Secondary | ICD-10-CM

## 2018-03-27 DIAGNOSIS — Z91199 Patient's noncompliance with other medical treatment and regimen due to unspecified reason: Secondary | ICD-10-CM

## 2018-03-27 LAB — POCT GLYCOSYLATED HEMOGLOBIN (HGB A1C): Hemoglobin A1C: 8.8 % — AB (ref 4.0–5.6)

## 2018-03-27 MED ORDER — VALSARTAN 160 MG PO TABS: 160.0000 mg | ORAL_TABLET | Freq: Every day | ORAL | 3 refills | Status: DC

## 2018-03-27 MED ORDER — RELION LANCETS THIN 26G MISC: 1.0000 [IU] | Freq: Two times a day (BID) | 12 refills | Status: DC

## 2018-03-27 MED ORDER — GLUCOSE BLOOD VI STRP: ORAL_STRIP | 12 refills | Status: DC

## 2018-03-27 MED ORDER — METFORMIN HCL 1000 MG PO TABS: 1000.0000 mg | ORAL_TABLET | Freq: Two times a day (BID) | ORAL | 3 refills | Status: DC

## 2018-03-27 MED ORDER — INSULIN PEN NEEDLE 32G X 4 MM MISC: 1 refills | Status: DC

## 2018-03-27 NOTE — Progress Notes (Signed)
  Subjective:    Patient ID: Haley George, female    DOB: 04-26-1955, 62 y.o.   MRN: [id number]  Haley George is a 62 y.o. female who presents for follow-up of Type 2 diabetes mellitus.  Patient is checking home blood sugars.   Home blood sugar records: meter record/ and log How often is blood sugars being checked: fasting QD avg. 97-140 Current symptoms/problems include none at this time. Daily foot checks: yes   Any foot concerns: fungal and treated by dermatology. Last eye exam: 07/2017 Dr. Katy Fitch Exercise: walking.  She is been having a lot of back and hip pain which is interfered with her walking.  She admits to dietary indiscretion.  When questioned further about making lifestyle changes she basically said that now she is volunteering and that interferes with her taking better care of herself.  When she was working, she use the excuse of work that kept her from taking better care of herself.  She is taking half of a valsartan per day.  She continues on Levemir at 80 units.  She is also taking metformin 500 twice daily.  Continues on her Crestor as well as fenofibrate.  The following portions of the patient's history were reviewed and updated as appropriate: allergies, current medications, past medical history, past social history and problem list.  ROS as in subjective above.     Objective:    Physical Exam Alert and in no distress.  Foot exam is recorded and is normal.   Lab Review Diabetic Labs Latest Ref Rng & Units 12/19/2017 03/13/2017 01/30/2017 10/18/2016 07/12/2016  HbA1c 4.0 - 5.6 % 9.2(A) 7.3 - 10.5(H) 7.1  Microalbumin mg/L - 5.9 - - 125.4  Micro/Creat Ratio - - 18.3 - - 58.3  Chol <200 mg/dL - - - - 175  HDL >50 mg/dL - - - - 34(L)  Calc LDL <100 mg/dL - - - - 91  Triglycerides <150 mg/dL - - - - 249(H)  Creatinine 0.50 - 1.05 mg/dL - - 1.18(H) 0.99 1.04   BP/Weight 12/19/2017 11/20/2017 08/07/2017 05/19/2017 16/55/3748  Systolic BP 270 786 754 492 010  Diastolic BP 90  78 88 64 78  Wt. (Lbs) 196.6 194 194.8 197.8 201.8  BMI 32.72 32.79 32.92 32.92 33.58   Foot/eye exam completion dates Latest Ref Rng & Units 02/12/2016 12/23/2014  Eye Exam No Retinopathy No Retinopathy No Retinopathy  Foot Form Completion - - -   A1c is 8.8. Haley George  reports that she has never smoked. She has never used smokeless tobacco. She reports that she does not drink alcohol or use drugs.     Assessment & Plan:     1. Rx changes: She is to take a full dose of the valsartan.  We will also increase her metformin to 1000 twice daily. 2. Education: Reviewed 'ABCs' of diabetes management (respective goals in parentheses):  A1C (<7), blood pressure (<130/80), and cholesterol (LDL <100). 3. Compliance at present is estimated to be poor. Efforts to improve compliance (if necessary) will be directed at increased exercise.  [facility] but she stated that this was too much of a hassle for her.  Any attempt by me for her to take better care of herself is usually met with an excuse as to why she cannot do it. 4. Follow up: 4 months

## 2018-03-28 LAB — CBC WITH DIFFERENTIAL/PLATELET
BASOS ABS: 0 10*3/uL (ref 0.0–0.2)
Basos: 1 %
EOS (ABSOLUTE): 0.3 10*3/uL (ref 0.0–0.4)
Eos: 8 %
HEMOGLOBIN: 12.4 g/dL (ref 11.1–15.9)
Hematocrit: 37.8 % (ref 34.0–46.6)
IMMATURE GRANULOCYTES: 0 %
Immature Grans (Abs): 0 10*3/uL (ref 0.0–0.1)
LYMPHS: 23 %
Lymphocytes Absolute: 0.9 10*3/uL (ref 0.7–3.1)
MCH: 30.5 pg (ref 26.6–33.0)
MCHC: 32.8 g/dL (ref 31.5–35.7)
MCV: 93 fL (ref 79–97)
MONOCYTES: 10 %
Monocytes Absolute: 0.4 10*3/uL (ref 0.1–0.9)
NEUTROS PCT: 58 %
Neutrophils Absolute: 2.4 10*3/uL (ref 1.4–7.0)
PLATELETS: 217 10*3/uL (ref 150–450)
RBC: 4.06 x10E6/uL (ref 3.77–5.28)
RDW: 12 % — ABNORMAL LOW (ref 12.3–15.4)
WBC: 4.1 10*3/uL (ref 3.4–10.8)

## 2018-03-28 LAB — LIPID PANEL
CHOLESTEROL TOTAL: 173 mg/dL (ref 100–199)
Chol/HDL Ratio: 8.2 ratio — ABNORMAL HIGH (ref 0.0–4.4)
HDL: 21 mg/dL — AB (ref >39)
Triglycerides: 487 mg/dL — ABNORMAL HIGH (ref 0–149)

## 2018-03-28 LAB — COMPREHENSIVE METABOLIC PANEL
ALT: 32 IU/L (ref 0–32)
AST: 33 IU/L (ref 0–40)
Albumin/Globulin Ratio: 1.6 (ref 1.2–2.2)
Albumin: 4.2 g/dL (ref 3.6–4.8)
Alkaline Phosphatase: 41 IU/L (ref 39–117)
BILIRUBIN TOTAL: 0.3 mg/dL (ref 0.0–1.2)
BUN/Creatinine Ratio: 17 (ref 12–28)
BUN: 18 mg/dL (ref 8–27)
CALCIUM: 9.4 mg/dL (ref 8.7–10.3)
CHLORIDE: 100 mmol/L (ref 96–106)
CO2: 23 mmol/L (ref 20–29)
CREATININE: 1.05 mg/dL — AB (ref 0.57–1.00)
GFR calc Af Amer: 67 mL/min/{1.73_m2} (ref >59)
GFR, EST NON AFRICAN AMERICAN: 58 mL/min/{1.73_m2} — AB (ref >59)
GLUCOSE: 231 mg/dL — AB (ref 65–99)
Globulin, Total: 2.7 g/dL (ref 1.5–4.5)
Potassium: 4.9 mmol/L (ref 3.5–5.2)
Sodium: 137 mmol/L (ref 134–144)
TOTAL PROTEIN: 6.9 g/dL (ref 6.0–8.5)

## 2018-03-28 MED ORDER — ICOSAPENT ETHYL 1 G PO CAPS: 2.0000 | ORAL_CAPSULE | Freq: Two times a day (BID) | ORAL | 11 refills | Status: DC

## 2018-03-28 NOTE — Addendum Note (Signed)
Addended by: Denita Lung on: 03/28/2018 08:14 AM   Modules accepted: Orders

## 2018-04-02 ENCOUNTER — Telehealth: Payer: Self-pay | Admitting: Cardiovascular Disease

## 2018-04-02 MED ORDER — RANEXA 1000 MG PO TB12: 1000.0000 mg | ORAL_TABLET | Freq: Two times a day (BID) | ORAL | 0 refills | Status: DC

## 2018-04-02 NOTE — Telephone Encounter (Addendum)
° ° ° °  Patient is requesting 90 day supply, also a call to let her know request is complete   1. Which medications need to be refilled? (please list name of each medication and dose if known) ranolazine (RANEXA) 1000 MG SR tablet  2. Which pharmacy/location (including street and city if local pharmacy) is medication to be sent to? [place], [place] - [address]  3. Do they need a 30 day or 90 day supply? [place]

## 2018-04-02 NOTE — Telephone Encounter (Signed)
Spoke with pt, a follow up appointment scheduled and script for 90 days sent to the pharmacy.

## 2018-04-04 ENCOUNTER — Other Ambulatory Visit: Payer: Self-pay | Admitting: *Deleted

## 2018-04-04 MED ORDER — RANOLAZINE ER 1000 MG PO TB12: 1000.0000 mg | ORAL_TABLET | Freq: Two times a day (BID) | ORAL | 3 refills | Status: DC

## 2018-04-07 ENCOUNTER — Other Ambulatory Visit: Payer: Self-pay | Admitting: Cardiovascular Disease

## 2018-04-09 ENCOUNTER — Ambulatory Visit: Payer: BC Managed Care – PPO | Admitting: Medical

## 2018-04-09 ENCOUNTER — Encounter: Payer: Self-pay | Admitting: Medical

## 2018-04-09 VITALS — BP 132/80 | HR 89 | Temp 98.0°F | Ht 65.0 in | Wt 201.0 lb

## 2018-04-09 DIAGNOSIS — R300 Dysuria: Secondary | ICD-10-CM

## 2018-04-09 LAB — POCT URINALYSIS DIP (PROADVANTAGE DEVICE)
Bilirubin, UA: NEGATIVE
Glucose, UA: NEGATIVE mg/dL
Ketones, POC UA: NEGATIVE mg/dL
Nitrite, UA: NEGATIVE
PH UA: 6 (ref 5.0–8.0)
Protein Ur, POC: 100 mg/dL — AB
Specific Gravity, Urine: 1.02
Urobilinogen, Ur: NEGATIVE

## 2018-04-09 MED ORDER — NITROFURANTOIN MONOHYD MACRO 100 MG PO CAPS: 100.0000 mg | ORAL_CAPSULE | Freq: Two times a day (BID) | ORAL | 0 refills | Status: DC

## 2018-04-09 NOTE — Telephone Encounter (Signed)
Rx request sent to pharmacy.  

## 2018-04-09 NOTE — Progress Notes (Signed)
Subjective: Chief Complaint  Patient presents with  . Urinary Tract Infection    x 2days    Here for 2 day hx/o dysuria, urinary frequency, fatigue, feels like UTI, urinary urgency, cloudy urine.  No blood in urine.   No fever.   No vomiting or diarrhea.  Has had some nausea.   Last UTI 12/2017.  Has been hospitalized in 2014 for urinary infection.  No other aggravating or relieving factors.  She notes being here 2 weeks ago with Dr. Redmond School for med check.  Was prescribed Vascepa, but copay was $200.   Wants me to let Dr. Redmond School know.    No other complaint.  Past Medical History:  Diagnosis Date  . Asthma   . Cancer ([place])    basal cell chest  . Complication of anesthesia   . Coronary artery disease   . Depression   . Diabetes mellitus without complication ([place])    type 2  . Heart murmur   . Hepatitis    auto- immune  . History of kidney stones   . History of nuclear stress test 05/21/2010   dipyridamole; normal perfusion, preserved LV systolic EF of 10%  . Hyperlipidemia   . Hypertension   . Liver failure ([place])    h/o autoimmune hepatitis   . Myocardial infarction ([place])   . Neuropathy    tingling and burning left leg to foot  . Obesity   . PONV (postoperative nausea and vomiting)   . Psoriasis   . Tubular adenoma of colon    colon path 01/09/2017   ROS as in subjective   Objective: BP 132/80   Pulse 89   Temp 98 F (36.7 C) (Oral)   Ht 5\' 5"  (1.651 m)   Wt 201 lb (91.2 kg)   SpO2 96%   BMI 33.45 kg/m   Gen: wd, wn, nad Abdomen: +bs, soft, mild suprapubic tenderness, otherwise non tender, no hepatosplenomegaly Back: no CVA tenderness   Assessment: Encounter Diagnosis  Name Primary?  . Dysuria Yes     Plan: We discussed her symptoms and concerns.  Urine culture sent.  Begin Macrobid below.  Discussed hydration, rest, and await culture  I gave her a coupon card and samples for Vascepa.  Advised she run coupon card to see if cheaper since Dr. Redmond School  wanted her to be on this.   Haley George was seen today for urinary tract infection.  Diagnoses and all orders for this visit:  Dysuria -     Urine Culture -     POCT Urinalysis DIP (Proadvantage Device)  Other orders -     nitrofurantoin, macrocrystal-monohydrate, (MACROBID) 100 MG capsule; Take 1 capsule (100 mg total) by mouth 2 (two) times daily.

## 2018-04-11 LAB — URINE CULTURE

## 2018-04-21 ENCOUNTER — Telehealth: Payer: Self-pay | Admitting: Family Medicine

## 2018-04-21 NOTE — Telephone Encounter (Signed)
P.A. VASCEPA  °

## 2018-04-27 ENCOUNTER — Ambulatory Visit: Payer: BC Managed Care – PPO | Admitting: Cardiovascular Disease

## 2018-04-27 ENCOUNTER — Other Ambulatory Visit: Payer: Self-pay | Admitting: Cardiovascular Disease

## 2018-04-27 ENCOUNTER — Encounter (INDEPENDENT_AMBULATORY_CARE_PROVIDER_SITE_OTHER): Payer: Self-pay

## 2018-04-27 ENCOUNTER — Encounter: Payer: Self-pay | Admitting: Cardiovascular Disease

## 2018-04-27 VITALS — BP 134/76 | HR 89 | Ht 65.0 in | Wt 206.0 lb

## 2018-04-27 DIAGNOSIS — IMO0002 Reserved for concepts with insufficient information to code with codable children: Secondary | ICD-10-CM

## 2018-04-27 DIAGNOSIS — I25118 Atherosclerotic heart disease of native coronary artery with other forms of angina pectoris: Secondary | ICD-10-CM | POA: Diagnosis not present

## 2018-04-27 DIAGNOSIS — E1169 Type 2 diabetes mellitus with other specified complication: Secondary | ICD-10-CM

## 2018-04-27 DIAGNOSIS — I358 Other nonrheumatic aortic valve disorders: Secondary | ICD-10-CM

## 2018-04-27 DIAGNOSIS — E118 Type 2 diabetes mellitus with unspecified complications: Secondary | ICD-10-CM

## 2018-04-27 DIAGNOSIS — E1159 Type 2 diabetes mellitus with other circulatory complications: Secondary | ICD-10-CM

## 2018-04-27 DIAGNOSIS — E6609 Other obesity due to excess calories: Secondary | ICD-10-CM

## 2018-04-27 DIAGNOSIS — E785 Hyperlipidemia, unspecified: Secondary | ICD-10-CM

## 2018-04-27 DIAGNOSIS — J4530 Mild persistent asthma, uncomplicated: Secondary | ICD-10-CM | POA: Diagnosis not present

## 2018-04-27 DIAGNOSIS — Z6834 Body mass index (BMI) 34.0-34.9, adult: Secondary | ICD-10-CM

## 2018-04-27 DIAGNOSIS — I10 Essential (primary) hypertension: Secondary | ICD-10-CM

## 2018-04-27 DIAGNOSIS — E1165 Type 2 diabetes mellitus with hyperglycemia: Secondary | ICD-10-CM

## 2018-04-27 NOTE — Progress Notes (Signed)
Patient ID: Haley George, female   DOB: 08/02/1955, 63 y.o.   MRN: [id number]    Cardiology Office Note    Date:  04/28/2018   ID:  QUAMESHA MULLET, DOB 1955/11/16, MRN [id number]  PCP:  Denita Lung, MD  Cardiologist:   Sanda Klein, MD   Chief Complaint  Patient presents with  . Follow-up    12 months.  . Headache    Bending.    History of Present Illness:  Haley George is a 63 y.o. female with previous percutaneous revascularization procedures performed in all 3 major coronary arteries and restenosis requiring repeat PCI.   Echo June 2018 showed normal left ventricular systolic function, EF 44-01% and mild diastolic dysfunction without elevated filling pressures.  She has a murmur which is due to aortic valve sclerosis.  No recent cardiovascular problems.The patient specifically denies any chest pain at rest exertion, dyspnea at rest or with exertion, orthopnea, paroxysmal nocturnal dyspnea, syncope, palpitations, focal neurological deficits, intermittent claudication, lower extremity edema, unexplained weight gain, cough, hemoptysis or wheezing.  Since Ranexa are generic and she is no longer receiving the drug from the manufacturer she has been unable to afford it.  She has not taken any for about a week and has not had any recurrence of angina.  She is limited by right hip pain.  She is scheduled to have an MRI.  This has led to a reduction in her activity level and she believes this is responsible for worsening glycemic control.  She is also received some steroid shots over the last year and taken some oral steroids for bronchospasm which have interfered with glucose control.  Her last hemoglobin A1c was 9.1%.  It is been accompanied by significant hypertriglyceridemia.  Her triglycerides were about 500 despite statin and high-dose fenofibrate.  The last interventions were performed in 2010 when she had overlapping stents placed at the level of older stents in the mid LAD  artery and in the left circumflex coronary artery. She has insulin-requiring type 2 diabetes mellitus and secondary mixed hypertriglyceridemia and hypercholesterolemia. She seems to have had substantial improvement with Ranexa. Her most recent nuclear stress test performed in November 2016 was normal.  2007 mid LAD 2.2512 mini vision, left circumflex 2.518 Cypher, RCA 3.023 Cypher 2010 mid LAD 2.2512 Taxus overlapping 2.012 mini vision, overlapping another 2.012 mini vision, left circumflex 2.516 Taxus upstream of previous stent.  Echo in June 2018 showed normal left ventricular systolic function.  There was mild LVH and relaxation impairment, but without signs of elevated filling pressures. She had a normal nuclear stress test in November 2016. EF was 56%. She had a "false positive" ECG response.    Past Medical History:  Diagnosis Date  . Asthma   . Cancer ([place])    basal cell chest  . Complication of anesthesia   . Coronary artery disease   . Depression   . Diabetes mellitus without complication ([place])    type 2  . Heart murmur   . Hepatitis    auto- immune  . History of kidney stones   . History of nuclear stress test 05/21/2010   dipyridamole; normal perfusion, preserved LV systolic EF of 02%  . Hyperlipidemia   . Hypertension   . Liver failure ([place])    h/o autoimmune hepatitis   . Myocardial infarction ([place])   . Neuropathy    tingling and burning left leg to foot  . Obesity   . PONV (postoperative nausea and  vomiting)   . Psoriasis   . Tubular adenoma of colon    colon path 01/09/2017    Past Surgical History:  Procedure Laterality Date  . Carotid Doppler  10/2008   R & L ICAs 0-49% diameter reduction   . COLONOSCOPY    . CORONARY ANGIOPLASTY WITH STENT PLACEMENT  09/2008   2.25x59mm Cypher DES to in-stent restenosis of prox LAD; Mini-Vision stent x2 2.0x71mm to area distal of initial LAD stent; 3 2.5x30mm Taxus stents prox to Cypher stent in circumflex  . CORONARY  ANGIOPLASTY WITH STENT PLACEMENT  09/2005   Cypher DES 3.0x64mm to distal RCA; 2.25x25mm Mini0Vision stent to mid LAD; 2.5x73mm Cypher stent to circumflex  . LUMBAR LAMINECTOMY/DECOMPRESSION MICRODISCECTOMY Left 10/18/2016   Procedure: Left Lumbar Four-Five Microdiscectomy;  Surgeon: Erline Levine, MD;  Location: [facility];  Service: Neurosurgery;  Laterality: Left;  Left L4-5 Microdiscectomy  . ROTATOR CUFF REPAIR Left   . SHOULDER ARTHROSCOPY W/ ROTATOR CUFF REPAIR Right   . TRANSTHORACIC ECHOCARDIOGRAM  04/2010   EF=>55%, borderline conc LVH; trace MR & TR; AV mildly sclerotic; aortic root sclerosis/calcif  . ureteral stents      Outpatient Medications Prior to Visit  Medication Sig Dispense Refill  . albuterol (PROVENTIL HFA;VENTOLIN HFA) 108 (90 Base) MCG/ACT inhaler Inhale 2 puffs into the lungs every 6 (six) hours as needed for wheezing or shortness of breath. 1 Inhaler 2  . Ascorbic Acid (VITAMIN C PO) Take 1 tablet by mouth daily.    Marland Kitchen aspirin EC 81 MG tablet Take 81 mg by mouth at bedtime.    . BD PEN NEEDLE NANO U/F 32G X 4 MM MISC USE DAILY AS DIRECTED 100 each 1  . Coenzyme Q10 300 MG CAPS Take 300 mg by mouth daily.    . Cranberry (SM CRANBERRY) 300 MG tablet Take 300 mg by mouth every evening.    Marland Kitchen doxycycline (VIBRA-TABS) 100 MG tablet Take 1 tablet (100 mg total) by mouth 2 (two) times daily. 10 tablet 0  . fenofibrate (TRICOR) 145 MG tablet Take 1 tablet (145 mg total) by mouth daily. 90 tablet 3  . fluticasone furoate-vilanterol (BREO ELLIPTA) 200-25 MCG/INH AEPB Inhale 1 puff into the lungs daily. 1 each 5  . glucose blood test strip Pt uses relion meter- test bid for glucose montioring E11.9 Please dispense relion test strips. 100 each 12  . Icosapent Ethyl (VASCEPA) 1 g CAPS Take 2 capsules (2 g total) by mouth 2 (two) times daily. 120 capsule 11  . Insulin Pen Needle (BD PEN NEEDLE NANO U/F) 32G X 4 MM MISC Use as directed for insulin administration. 100 each 1  . isosorbide  mononitrate (IMDUR) 30 MG 24 hr tablet TAKE 1 TABLET BY MOUTH EVERY DAY 90 tablet 0  . LEVEMIR FLEXTOUCH 100 UNIT/ML Pen INJECT 66 UNITS INTO THE SKIN DAILY AT 10 PM, GO UP 2 UNITS EVERY 2 DAYS UNTIL MORNING IS 120 30 pen 3  . metFORMIN (GLUCOPHAGE) 1000 MG tablet Take 1 tablet (1,000 mg total) by mouth 2 (two) times daily with a meal. 180 tablet 3  . metoprolol succinate (TOPROL-XL) 25 MG 24 hr tablet Take 1.5 tablets (37.5 mg total) by mouth daily. 135 tablet 3  . Multiple Vitamin (MULTIVITAMIN WITH MINERALS) TABS Take 1 tablet by mouth daily.    . nitrofurantoin, macrocrystal-monohydrate, (MACROBID) 100 MG capsule Take 1 capsule (100 mg total) by mouth 2 (two) times daily. 14 capsule 0  . NITROSTAT 0.4 MG SL tablet  DISSOLVE 1 TAB UNDER THE TONGUE EVERY 5 MIN AS NEEDED FOR CHEST PAIN (MAX OF 3) 25 tablet 1  . Omega-3 Fatty Acids (FISH OIL) 1000 MG CAPS Take 1,000-2,000 mg by mouth 2 (two) times daily. 1000 mg in the morning & 2000 mg in the evening    . predniSONE (DELTASONE) 10 MG tablet 40 mg daily x 2 days, 20mg  daily x 2 days, 10mg  daily x 2 days, 5mg  daily x 2 days and stop 15 tablet 0  . RELION LANCETS THIN 26G MISC 1 Units by Does not apply route 2 (two) times daily. E 11.9 100 each 12  . rosuvastatin (CRESTOR) 20 MG tablet Take 1 tablet (20 mg total) by mouth daily. 90 tablet 3  . valsartan (DIOVAN) 160 MG tablet Take 1 tablet (160 mg total) by mouth daily. 90 tablet 3  . Krill Oil 500 MG CAPS Take 500 mg by mouth every evening.    . ranolazine (RANEXA) 1000 MG SR tablet Take 1 tablet (1,000 mg total) by mouth 2 (two) times daily. 180 tablet 3   No facility-administered medications prior to visit.      Allergies:   Penicillins   Social History   Socioeconomic History  . Marital status: Single    Spouse name: Not on file  . Number of children: 1  . Years of education: Not on file  . Highest education level: Not on file  Occupational History    Employer: [employer]  . Financial resource strain: Not on file  . Food insecurity:    Worry: Not on file    Inability: Not on file  . Transportation needs:    Medical: Not on file    Non-medical: Not on file  Tobacco Use  . Smoking status: Never Smoker  . Smokeless tobacco: Never Used  Substance and Sexual Activity  . Alcohol use: No  . Drug use: No  . Sexual activity: Not Currently  Lifestyle  . Physical activity:    Days per week: Not on file    Minutes per session: Not on file  . Stress: Not on file  Relationships  . Social connections:    Talks on phone: Not on file    Gets together: Not on file    Attends religious service: Not on file    Active member of club or organization: Not on file    Attends meetings of clubs or organizations: Not on file    Relationship status: Not on file  Other Topics Concern  . Not on file  Social History Narrative  . Not on file     ROS:   Please see the history of present illness.    ROS All other systems are reviewed and are negative   PHYSICAL EXAM:   VS:  BP 134/76 (BP Location: Left Arm, Patient Position: Sitting, Cuff Size: Normal)   Pulse 89   Ht 5\' 5"  (1.651 m)   Wt 206 lb (93.4 kg)   BMI 34.28 kg/m      General: Alert, oriented x3, no distress, moderately obese Head: no evidence of trauma, PERRL, EOMI, no exophtalmos or lid lag, no myxedema, no xanthelasma; normal ears, nose and oropharynx Neck: normal jugular venous pulsations and no hepatojugular reflux; brisk carotid pulses without delay and no carotid bruits Chest: She has a few scattered wheezes bilaterally, normal fremitus, symmetrical and full respiratory excursions Cardiovascular: normal position and quality of the apical impulse, regular rhythm, normal first  and second heart sounds, 2-3/6 early peaking aortic ejection murmur radiating towards the carotids and through the right upper chest, no diastolic murmurs, rubs or gallops Abdomen: no tenderness or  distention, no masses by palpation, no abnormal pulsatility or arterial bruits, normal bowel sounds, no hepatosplenomegaly Extremities: no clubbing, cyanosis or edema; 2+ radial, ulnar and brachial pulses bilaterally; 2+ right femoral, posterior tibial and dorsalis pedis pulses; 2+ left femoral, posterior tibial and dorsalis pedis pulses; no subclavian or femoral bruits Neurological: grossly nonfocal Psych: Normal mood and affect   Wt Readings from Last 3 Encounters:  04/27/18 206 lb (93.4 kg)  04/09/18 201 lb (91.2 kg)  03/27/18 204 lb 3.2 oz (92.6 kg)      Studies/Labs Reviewed:   EKG:  EKG is ordered today.  It is a completely normal tracing, normal sinus rhythm.  QTc 447 ms.  Recent Labs: 03/27/2018: ALT 32; BUN 18; Creatinine, Ser 1.05; Hemoglobin 12.4; Platelets 217; Potassium 4.9; Sodium 137   Lipid Panel    Component Value Date/Time   CHOL 173 03/27/2018 1100   TRIG 487 (H) 03/27/2018 1100   HDL 21 (L) 03/27/2018 1100   CHOLHDL 8.2 (H) 03/27/2018 1100   CHOLHDL 5.1 (H) 07/12/2016 1413   VLDL 50 (H) 07/12/2016 1413   LDLCALC Comment 03/27/2018 1100   LDLDIRECT 93 11/15/2012 0829    ASSESSMENT:    1. Coronary artery disease of native artery of native heart with stable angina pectoris ([place])   2. Mild persistent asthma without complication   3. Aortic valve sclerosis   4. Hyperlipidemia associated with type 2 diabetes mellitus ([place])   5. Hypertension associated with diabetes ([place])   6. Diabetes mellitus type 2, uncontrolled, with complications (H[facility])   7. Class 1 obesity due to excess calories with serious comorbidity and body mass index (BMI) of 34.0 to 34.9 in adult      PLAN:  In order of problems listed above:  1. CAD: Remains asymptomatic despite unable to take ranolazine.  Had a low risk nuclear stress test 2016.  Normal left ventricular systolic function.  Discussed options to increase isosorbide or add amlodipine if she develops stable angina  pectoris. 2. Reactive airway disease: Wheezing today because she forgot to take her inhaler.  Recent echo showed no evidence to support either systolic or elevation in mean left atrial pressure due to diastolic heart failure. 3. Ao sclerosis: Her murmur is very prominent but the echo in June 2018 shows no evidence of significant aortic stenosis. 4. HLP: Requires 3 agents to keep her cholesterol and triglycerides in favorable range, due to mixed hyper lipidemia at baseline.  Triglycerides have worsened due to poor glycemic control.  I think she is an excellent candidate for Vascepa, which will offer superior results compared to generic omega-3 supplements. 5. HTN: Fair control 6. DM: Substantial worsening of glycemic control related to steroid exposure and reduced physical activity.  I think she would do well with Jardiance or Wilder Glade since she has well-established cardiovascular disease. 7. Obesity: Reviewed the importance of calorie restriction, even more so if she is unable to physical.    Medication Adjustments/Labs and Tests Ordered: Current medicines are reviewed at length with the patient today.  Concerns regarding medicines are outlined above.  Medication changes, Labs and Tests ordered today are listed in the Patient Instructions below. Patient Instructions  Medication Instructions:  Dr Sallyanne Kuster has recommended making the following medication changes: 1. STOP Ranexa 2. STOP Krill oil  If you need a  refill on your cardiac medications before your next appointment, please call your pharmacy.   Lab work: Your physician recommends that you return for lab work in 6 months with your primary care doctor - FASTING.  If you have labs (blood work) drawn today and your tests are completely normal, you will receive your results only by: Marland Kitchen MyChart Message (if you have MyChart) OR . A paper copy in the mail If you have any lab test that is abnormal or we need to change your treatment, we will call  you to review the results.  Follow-Up: At [facility], you and your health needs are our priority.  As part of our continuing mission to provide you with exceptional heart care, we have created designated Provider Care Teams.  These Care Teams include your primary Cardiologist (physician) and Advanced Practice Providers (APPs -  Physician Assistants and Nurse Practitioners) who all work together to provide you with the care you need, when you need it. You will need a follow up appointment in 6 months.  You may see Sanda Klein, MD or one of the following Advanced Practice Providers on your designated Care Team: [place], [place] . Fabian Sharp, PA-C . You will receive a reminder letter in the mail two months in advance. If you don't receive a letter, please call our office to schedule the follow-up appointment.      Signed, Sanda Klein, MD  04/28/2018 12:36 PM    [address], [place], [place]  [address] Phone: [phone]; Fax: [phone]

## 2018-04-27 NOTE — Patient Instructions (Addendum)
Medication Instructions:  Dr Sallyanne Kuster has recommended making the following medication changes: 1. STOP Ranexa 2. STOP Krill oil  If you need a refill on your cardiac medications before your next appointment, please call your pharmacy.   Lab work: Your physician recommends that you return for lab work in 6 months with your primary care doctor - FASTING.  If you have labs (blood work) drawn today and your tests are completely normal, you will receive your results only by: Marland Kitchen MyChart Message (if you have MyChart) OR . A paper copy in the mail If you have any lab test that is abnormal or we need to change your treatment, we will call you to review the results.  Follow-Up: At [facility], you and your health needs are our priority.  As part of our continuing mission to provide you with exceptional heart care, we have created designated Provider Care Teams.  These Care Teams include your primary Cardiologist (physician) and Advanced Practice Providers (APPs -  Physician Assistants and Nurse Practitioners) who all work together to provide you with the care you need, when you need it. You will need a follow up appointment in 6 months.  You may see Sanda Klein, MD or one of the following Advanced Practice Providers on your designated Care Team: [place], [place] . Fabian Sharp, PA-C . You will receive a reminder letter in the mail two months in advance. If you don't receive a letter, please call our office to schedule the follow-up appointment.

## 2018-04-28 DIAGNOSIS — I358 Other nonrheumatic aortic valve disorders: Secondary | ICD-10-CM | POA: Insufficient documentation

## 2018-04-28 DIAGNOSIS — I350 Nonrheumatic aortic (valve) stenosis: Secondary | ICD-10-CM | POA: Insufficient documentation

## 2018-04-28 DIAGNOSIS — E6609 Other obesity due to excess calories: Secondary | ICD-10-CM | POA: Insufficient documentation

## 2018-04-28 DIAGNOSIS — Z6834 Body mass index (BMI) 34.0-34.9, adult: Secondary | ICD-10-CM

## 2018-04-30 NOTE — Telephone Encounter (Signed)
Rx(s) sent to pharmacy electronically.  

## 2018-05-05 NOTE — Telephone Encounter (Signed)
P.A. approved til 04/22/21, pt informed and cost if $9 with discount card

## 2018-05-14 ENCOUNTER — Other Ambulatory Visit: Payer: Self-pay | Admitting: Family Medicine

## 2018-05-14 ENCOUNTER — Other Ambulatory Visit: Payer: Self-pay | Admitting: Cardiovascular Disease

## 2018-05-14 DIAGNOSIS — E1169 Type 2 diabetes mellitus with other specified complication: Secondary | ICD-10-CM

## 2018-05-14 DIAGNOSIS — E785 Hyperlipidemia, unspecified: Principal | ICD-10-CM

## 2018-05-14 NOTE — Telephone Encounter (Signed)
Rx has been sent to the pharmacy electronically. ° °

## 2018-06-06 ENCOUNTER — Other Ambulatory Visit: Payer: Self-pay | Admitting: Cardiovascular Disease

## 2018-06-14 ENCOUNTER — Other Ambulatory Visit: Payer: Self-pay | Admitting: Cardiovascular Disease

## 2018-06-14 DIAGNOSIS — E1159 Type 2 diabetes mellitus with other circulatory complications: Secondary | ICD-10-CM

## 2018-06-14 DIAGNOSIS — L409 Psoriasis, unspecified: Secondary | ICD-10-CM

## 2018-06-14 DIAGNOSIS — I10 Essential (primary) hypertension: Principal | ICD-10-CM

## 2018-06-14 DIAGNOSIS — I152 Hypertension secondary to endocrine disorders: Secondary | ICD-10-CM

## 2018-07-11 ENCOUNTER — Telehealth: Payer: Self-pay | Admitting: Family Medicine

## 2018-07-11 ENCOUNTER — Ambulatory Visit: Payer: BC Managed Care – PPO | Admitting: Family Medicine

## 2018-07-11 ENCOUNTER — Other Ambulatory Visit: Payer: Self-pay

## 2018-07-11 ENCOUNTER — Encounter: Payer: Self-pay | Admitting: Family Medicine

## 2018-07-11 VITALS — BP 140/80 | HR 80 | Temp 98.3°F | Ht 65.0 in | Wt 200.6 lb

## 2018-07-11 DIAGNOSIS — R05 Cough: Secondary | ICD-10-CM

## 2018-07-11 DIAGNOSIS — E118 Type 2 diabetes mellitus with unspecified complications: Secondary | ICD-10-CM | POA: Diagnosis not present

## 2018-07-11 DIAGNOSIS — J4521 Mild intermittent asthma with (acute) exacerbation: Secondary | ICD-10-CM

## 2018-07-11 DIAGNOSIS — J329 Chronic sinusitis, unspecified: Secondary | ICD-10-CM | POA: Diagnosis not present

## 2018-07-11 DIAGNOSIS — R059 Cough, unspecified: Secondary | ICD-10-CM

## 2018-07-11 DIAGNOSIS — R509 Fever, unspecified: Secondary | ICD-10-CM | POA: Diagnosis not present

## 2018-07-11 DIAGNOSIS — J40 Bronchitis, not specified as acute or chronic: Secondary | ICD-10-CM

## 2018-07-11 LAB — POCT INFLUENZA A/B
Influenza A, POC: NEGATIVE
Influenza B, POC: NEGATIVE

## 2018-07-11 MED ORDER — AZITHROMYCIN 250 MG PO TABS: ORAL_TABLET | ORAL | 0 refills | Status: DC

## 2018-07-11 MED ORDER — ALBUTEROL SULFATE (2.5 MG/3ML) 0.083% IN NEBU: 2.5000 mg | INHALATION_SOLUTION | Freq: Once | RESPIRATORY_TRACT | Status: DC

## 2018-07-11 NOTE — Progress Notes (Signed)
Chief Complaint  Patient presents with  . Fever    started Sunday, cough started before the fever maybe Fri night. Has had HA since Sunday. Body aches. Did travel last Sat-Mon to [place].    7-8 days ago she started with post-nasal drainage, cough.  She then developed head congestion, and fever started 3 days ago.  Tmax 102.  Felt like fever broke earlier today.   +head congestion--in the last day the mucus has become yellow-tinged. She was coughing up phlegm for a while, then changed to dry, hacky cough, and it is now back to being productive--slightly yellow, productive all day long.  She has had a sore throat off and on, started at onset of illness, improved, but then recurred.  Today the right side of the throat was very sore, and radiates up to her ear.  She reports h/o ear infections.  She took Alka selzer cold tablets at bedtime, which has helped control her symptoms at night.  Has been able to sleep at night.  Hasn't needed to use albuterol inhaler; continues to take daily Breo inhaler. She has some heaviness and congestion in her chest, no shortness of breath.  Limited activity due to right hip/knee pain; hasn't had shortness of breath.  Was in [facility] 3/7-9.  Symptoms started 3/11.  [place] once, no other public transportation. She traveled with husband and daughter; nobody else is sick. Also went to an [employer] meeting 9/10 (without her family), about 30 people.  PMH, PSH, SH reviewed. She reports she recently had stem cell injections recently in hip and knee  Outpatient Encounter Medications as of 07/11/2018  Medication Sig Note  . Ascorbic Acid (VITAMIN C PO) Take 1 tablet by mouth daily.   . BD PEN NEEDLE NANO U/F 32G X 4 MM MISC USE DAILY AS DIRECTED   . Coenzyme Q10 300 MG CAPS Take 300 mg by mouth daily.   . Cranberry (SM CRANBERRY) 300 MG tablet Take 300 mg by mouth every evening.   . fenofibrate (TRICOR) 145 MG tablet TAKE 1 TABLET BY MOUTH EVERY DAY   .  fluticasone furoate-vilanterol (BREO ELLIPTA) 200-25 MCG/INH AEPB Inhale 1 puff into the lungs daily.   Marland Kitchen glucose blood test strip Pt uses relion meter- test bid for glucose montioring E11.9 Please dispense relion test strips.   Vanessa Kick Ethyl (VASCEPA) 1 g CAPS Take 2 capsules (2 g total) by mouth 2 (two) times daily.   . Insulin Pen Needle (BD PEN NEEDLE NANO U/F) 32G X 4 MM MISC Use as directed for insulin administration.   . isosorbide mononitrate (IMDUR) 30 MG 24 hr tablet Take 1 tablet (30 mg total) by mouth daily. ** DO NOT CRUSH **   . LEVEMIR FLEXTOUCH 100 UNIT/ML Pen INJECT 66 UNITS INTO THE SKIN DAILY AT 10 PM, GO UP 2 UNITS EVERY 2 DAYS UNTIL MORNING IS 120   . metFORMIN (GLUCOPHAGE) 1000 MG tablet Take 1 tablet (1,000 mg total) by mouth 2 (two) times daily with a meal.   . metoprolol succinate (TOPROL-XL) 25 MG 24 hr tablet TAKE 1.5 TABLETS (37.5 MG TOTAL) BY MOUTH DAILY.   . Multiple Vitamin (MULTIVITAMIN WITH MINERALS) TABS Take 1 tablet by mouth daily.   Marland Kitchen RELION LANCETS THIN 26G MISC 1 Units by Does not apply route 2 (two) times daily. E 11.9   . rosuvastatin (CRESTOR) 20 MG tablet TAKE 1 TABLET BY MOUTH EVERY DAY   . valsartan (DIOVAN) 160 MG tablet Take  1 tablet (160 mg total) by mouth daily.   . [DISCONTINUED] Omega-3 Fatty Acids (FISH OIL) 1000 MG CAPS Take 1,000-2,000 mg by mouth 2 (two) times daily. 1000 mg in the morning & 2000 mg in the evening   . albuterol (PROVENTIL HFA;VENTOLIN HFA) 108 (90 Base) MCG/ACT inhaler Inhale 2 puffs into the lungs every 6 (six) hours as needed for wheezing or shortness of breath. (Patient not taking: Reported on 07/11/2018)   . aspirin EC 81 MG tablet Take 81 mg by mouth at bedtime.   Marland Kitchen NITROSTAT 0.4 MG SL tablet DISSOLVE 1 TAB UNDER THE TONGUE EVERY 5 MIN AS NEEDED FOR CHEST PAIN (MAX OF 3) (Patient not taking: Reported on 07/11/2018) 10/18/2016: Never used medication    . [DISCONTINUED] doxycycline (VIBRA-TABS) 100 MG tablet Take 1 tablet  (100 mg total) by mouth 2 (two) times daily.   . [DISCONTINUED] nitrofurantoin, macrocrystal-monohydrate, (MACROBID) 100 MG capsule Take 1 capsule (100 mg total) by mouth 2 (two) times daily.   . [DISCONTINUED] predniSONE (DELTASONE) 10 MG tablet 40 mg daily x 2 days, 20mg  daily x 2 days, 10mg  daily x 2 days, 5mg  daily x 2 days and stop    No facility-administered encounter medications on file as of 07/11/2018.    Allergies  Allergen Reactions  . Penicillins Hives     PATIENT HAD A PCN REACTION WITH IMMEDIATE RASH, FACIAL/TONGUE/THROAT SWELLING, SOB, OR LIGHTHEADEDNESS WITH HYPOTENSION:  #  #  #  YES  #  #  #   Has patient had a PCN reaction causing severe rash involving mucus membranes or skin necrosis:Unknown Has patient had a PCN reaction that required hospitalization:No Has patient had a PCN reaction occurring within the last 10 years:No If all of the above answers are "NO", then may proceed with Cephalosporin use.    ROS:  Fever, and URI symptoms per HPI. Chest congestion per HPI. No dyspnea, nausea, vomiting, diarrhea or other bowel changes, no urinary complaints, bleeding, bruising, rash.  +joint pain (right knee, hip).  No mood changes   PHYSICAL EXAM:  BP 140/80   Pulse 80   Temp 98.3 F (36.8 C)   Ht 5\' 5"  (1.651 m)   Wt 200 lb 9.6 oz (91 kg)   BMI 33.38 kg/m   Well-appearing female, wearing mask, sounds very congested.  Rare cough during visit. Speaking comfortably, in no distress HEENT: conjunctiva and sclera are clear, EOMI.  Nasal mucosa is mildly edematous, with mild erythema, and bleeding noted in right distal nares. Sinuses tender x 4, more tender on both maxillary than frontal sinuses OP is clear TM's--mild effusion on the right, no erythema, slightly retracted on the left Neck: No lymphadenopathy or mass Heart: regular rate and rhythm Lungs: Diffuse wheezing throughout and coarse breath sounds. After nebulizer air movement was improved. Still had coarse  breath sounds and some scattered wheezes Neuro: alert and oriented, cranial nerves intact, normal gait Psych: normal mood, affect, hygiene and grooming Skin: normal turgor, no visible rash  Influenza tests negative for A&B  ASSESSMENT/PLAN:  Mild intermittent asthmatic bronchitis with acute exacerbation - Plan: azithromycin (ZITHROMAX) 250 MG tablet  Sinobronchitis  Fever, unspecified fever cause - Plan: Influenza A/B  Diabetes mellitus with complication ([facility])  Cough - Plan: Influenza A/B, albuterol (PROVENTIL) (2.5 MG/3ML) 0.083% nebulizer solution 2.5 mg  Supportive measures reviewed. Treat with z-pak.  Last had one in December.  If not responding, change to doxycycline. To use albuterol inhaler more frequently. F/u if persistent/worsening symptoms.  Initially treated as high risk pt for COVID given h/o fever, cough, shortness of breath, and recent travel to [place].  This does not appear to be c/w with COVID-19 infection, so not tested.   Stay well hydrated. Continue your Breo, and also use albuterol inhaler as needed for tightness, wheezing, dry cough or shortness of breath.  Take the z-pak as directed. Contact us if you don't get completely better (stays working in your system for 10 days).  If you need an extra course because symptoms are not completely resolved, it would start on day 11. If you aren't improving in the first 5 days at all, we would change your antibiotic to doxycycline.  Use Mucinex (guaifenesin), an expectorant to help keep your mucus thin.

## 2018-07-11 NOTE — Patient Instructions (Signed)
  Stay well hydrated. Continue your Breo, and also use albuterol inhaler as needed for tightness, wheezing, dry cough or shortness of breath.  Take the z-pak as directed. Contact us if you don't get completely better (stays working in your system for 10 days).  If you need an extra course because symptoms are not completely resolved, it would start on day 11. If you aren't improving in the first 5 days at all, we would change your antibiotic to doxycycline.  Use Mucinex (guaifenesin), an expectorant to help keep your mucus thin.

## 2018-07-11 NOTE — Telephone Encounter (Signed)
Pt called and has had a fever for 5 days of 101-102. She has coughing,has traveled to [place] over the weekend and was out sight seeing there, she also has sore throat and headache. Pt can be reached at [phone]

## 2018-07-26 ENCOUNTER — Other Ambulatory Visit: Payer: Self-pay | Admitting: Internal Medicine

## 2018-07-26 ENCOUNTER — Telehealth: Payer: Self-pay

## 2018-07-26 NOTE — Telephone Encounter (Signed)
Called pt to advise of Zoom needed for virtual meeting on Monday. LVM for pt to call back. [place]

## 2018-07-28 ENCOUNTER — Other Ambulatory Visit: Payer: Self-pay | Admitting: Internal Medicine

## 2018-07-30 ENCOUNTER — Ambulatory Visit: Payer: BC Managed Care – PPO | Admitting: Family Medicine

## 2018-08-08 ENCOUNTER — Other Ambulatory Visit: Payer: Self-pay

## 2018-08-08 ENCOUNTER — Other Ambulatory Visit: Payer: Self-pay | Admitting: Internal Medicine

## 2018-08-08 ENCOUNTER — Ambulatory Visit (INDEPENDENT_AMBULATORY_CARE_PROVIDER_SITE_OTHER): Payer: BC Managed Care – PPO | Admitting: Nurse Practitioner

## 2018-08-08 ENCOUNTER — Encounter: Payer: Self-pay | Admitting: Nurse Practitioner

## 2018-08-08 DIAGNOSIS — J82 Pulmonary eosinophilia, not elsewhere classified: Secondary | ICD-10-CM | POA: Diagnosis not present

## 2018-08-08 DIAGNOSIS — J8283 Eosinophilic asthma: Secondary | ICD-10-CM

## 2018-08-08 MED ORDER — FLUTICASONE FUROATE-VILANTEROL 200-25 MCG/INH IN AEPB: 1.0000 | INHALATION_SPRAY | Freq: Every day | RESPIRATORY_TRACT | 3 refills | Status: DC

## 2018-08-08 NOTE — Telephone Encounter (Signed)
Spoke with the pt  I refilled her [facility] and advised needs visit since not seen her over a year  Televisit with Tonya set for 3 pm today

## 2018-08-08 NOTE — Progress Notes (Signed)
Virtual Visit via Telephone Note  I connected with Haley George on 08/08/18 at  3:00 PM EDT by telephone and verified that I am speaking with the correct person using two identifiers.   I discussed the limitations, risks, security and privacy concerns of performing an evaluation and management service by telephone and the availability of in person appointments. I also discussed with the patient that there may be a patient responsible charge related to this service. The patient expressed understanding and agreed to proceed.   History of Present Illness: 63 year old female with cough variant asthma allergic phenotype who is followed by Dr. Chase Caller.  Patient has a tele-visit today for a follow-up visit.  She was last seen by Dr. Chase Caller on 05/19/2017.  She states that this is been a stable interval for her.  She has been doing well and has not had any exacerbations.  She has been compliant with Breo.  She takes Flonase as needed.  She states that she does need a refill on her Breo today.  She denies any recent cough or wheezing.  Denies f/c/s, n/v/d, hemoptysis, PND, leg swelling.    Observations/Objective:  Dr Lorenza [place] Reflux Symptom Index (> 13-15 suggestive of LPR cough) 04/06/2017 feno 78 05/19/2017 feno 47  Hoarseness of problem with voice 3 0  Clearing  Of Throat 3 1  Excess throat mucus or feeling of post nasal drip 4 3  Difficulty swallowing food, liquid or tablets 1 0  Cough after eating or lying down 5 0  Breathing difficulties or choking episodes 4 0  Troublesome or annoying cough 5 0  Sensation of something sticking in throat or lump in throat 4 0  Heartburn, chest pain, indigestion, or stomach acid coming up 0 0  TOTAL 29 4      Ref. Range 04/06/2017 11:17  Sheep Sorrel IgE Latest Units: kU/L 0.19 (H)  Pecan/Hickory Tree IgE Latest Units: kU/L <0.10  IgE (Immunoglobulin E), Serum Latest Ref Range: <OR=114 kU/L 621 (H)  Allergen, D pternoyssinus,d7 Latest Units:  kU/L <0.10  Cat Dander Latest Units: kU/L <0.10  Dog Dander Latest Units: kU/L <0.10  [place] Grass Latest Units: kU/L 0.19 (H)  Johnson Grass Latest Units: kU/L 0.17 (H)  Timothy Grass Latest Units: kU/L 0.17 (H)  Cockroach Latest Units: kU/L 0.13 (H)  Elm IgE Latest Units: kU/L 0.51 (H)  COMMON RAGWEED (SHORT) (W1) IGE Latest Units: kU/L 0.16 (H)  D. farinae Latest Units: kU/L <0.10  Box Elder IgE Latest Units: kU/L 0.28 (H)  Rough Pigweed  IgE Latest Units: kU/L 0.19 (H)    Results for CANDY, LEVERETT (MRN [id number]) as of 05/19/2017 11:50    Ref. Range 04/06/2017 11:17  IgE (Immunoglobulin E), Serum Latest Ref Range: <OR=114 kU/L 621 (H)     +CXR 03/09/17 - personally visualized and clear - to me and radiology  Results for DAILY, DOE (MRN [id number]) as of 04/06/2017 10:16  Ref. Range 01/03/2012 07:27 12/03/2012 15:43 07/12/2016 14:13 10/18/2016 10:50 01/30/2017 10:14  Eosinophils Absolute Latest Ref Range: 15 - 500 cells/uL 0.2 0.2 245  455  Results for IVETTE, CASTRONOVA (MRN [id number]) as of 04/06/2017 10:16  Ref. Range 01/03/2012 07:27 12/03/2012 15:43 07/12/2016 14:13 10/18/2016 10:50 01/30/2017 10:14  Eosinophil Latest Units: % 2 4 7   11.1   FeNO 78 ppb 04/06/2017   Assessment and Plan: Discussion: Patient was last seen by Dr. Chase Caller on 05/19/2017.  She states that this is been a stable interval for her.  She  has been doing well and has not had any exacerbations.  She has been compliant with Breo.  She takes Flonase as needed.  She states that she does need a refill on her Breo today.  She denies any recent cough or wheezing.  Patient Instructions  Continue breo scheduled and as needed flonase Use albuterol as needed Use mask with lawn mowing    Follow Up Instructions:  Follow up with Dr. Chase Caller in 6 months or sooner if needed   I discussed the assessment and treatment plan with the patient. The patient was provided an opportunity to ask questions  and all were answered. The patient agreed with the plan and demonstrated an understanding of the instructions.   The patient was advised to call back or seek an in-person evaluation if the symptoms worsen or if the condition fails to improve as anticipated.  I provided 22 minutes of non-face-to-face time during this encounter.   Fenton Foy, NP

## 2018-08-08 NOTE — Telephone Encounter (Signed)
ATC, went to voicemail. LMTCB. 

## 2018-08-08 NOTE — Assessment & Plan Note (Signed)
Discussion: Patient was last seen by Dr. Chase Caller on 05/19/2017.  She states that this is been a stable interval for her.  She has been doing well and has not had any exacerbations.  She has been compliant with Breo.  She takes Flonase as needed.  She states that she does need a refill on her Breo today.  She denies any recent cough or wheezing.  Patient Instructions  Continue breo scheduled and as needed flonase Use albuterol as needed Use mask with lawn mowing  Follow up:  Follow up with Dr. Chase Caller in 6 months or sooner if needed

## 2018-08-08 NOTE — Telephone Encounter (Signed)
Pt returning call to [facility] and can be reached @ [phone].Haley George

## 2018-08-08 NOTE — Patient Instructions (Addendum)
Continue breo scheduled and as needed flonase Use albuterol as needed Use mask with lawn mowing  Follow up:  Follow up with Dr. Chase Caller in 6 months or sooner if needed

## 2018-08-08 NOTE — Telephone Encounter (Signed)
Attempted to call pt but unable to reach. Left message for pt to return call.  When pt does return call, pt needs to be scheduled televisit so she can then be able to receive med refills.

## 2018-08-16 ENCOUNTER — Encounter: Payer: Self-pay | Admitting: Family Medicine

## 2018-08-20 ENCOUNTER — Ambulatory Visit: Payer: BC Managed Care – PPO | Admitting: Family Medicine

## 2018-08-20 ENCOUNTER — Telehealth: Payer: Self-pay

## 2018-08-20 ENCOUNTER — Other Ambulatory Visit: Payer: Self-pay

## 2018-08-20 ENCOUNTER — Encounter: Payer: Self-pay | Admitting: Family Medicine

## 2018-08-20 VITALS — BP 152/84 | Temp 98.2°F | Ht 65.0 in | Wt 203.0 lb

## 2018-08-20 DIAGNOSIS — I10 Essential (primary) hypertension: Secondary | ICD-10-CM | POA: Diagnosis not present

## 2018-08-20 DIAGNOSIS — E118 Type 2 diabetes mellitus with unspecified complications: Secondary | ICD-10-CM

## 2018-08-20 DIAGNOSIS — E1159 Type 2 diabetes mellitus with other circulatory complications: Secondary | ICD-10-CM | POA: Diagnosis not present

## 2018-08-20 DIAGNOSIS — N3000 Acute cystitis without hematuria: Secondary | ICD-10-CM

## 2018-08-20 MED ORDER — SULFAMETHOXAZOLE-TRIMETHOPRIM 800-160 MG PO TABS: 1.0000 | ORAL_TABLET | Freq: Two times a day (BID) | ORAL | 0 refills | Status: DC

## 2018-08-20 NOTE — Progress Notes (Signed)
lvm for pt to call and make an appointment . [place]

## 2018-08-20 NOTE — Progress Notes (Signed)
Check the records and see when she is scheduled to come back in.  She to come in probably in about a month

## 2018-08-20 NOTE — Progress Notes (Signed)
Start time: 11:20 End time:  11:40  Virtual Visit via Telephone Note  I connected with Haley George on 08/20/18 at 11:00 AM EDT by telephone and verified that I am speaking with the correct person using two identifiers.  Patient is at home, alone (husband is upstairs). MD is in office.   I discussed the limitations, risks, security and privacy concerns of performing an evaluation and management service by telephone and the availability of in person appointments. I also discussed with the patient that there may be a patient responsible charge related to this service. The patient expressed understanding and agreed to proceed. She reports she was unable to do video visit.   History of Present Illness: Chief Complaint  Patient presents with  . Urinary Tract Infection    thinks she mau have UTI. Having burning with urination, frequency and general discomfort. Urine is cloudy. Symptoms started Friday.    "I'm pretty sure I have a UTI." She has had UTI's in the past, last was in 03/2018. Culture showed E.coli, treated with macrobid.  Prior to that was June or July (treated by an Urgent Care), and she thinks it was treated with the same antibiotic. She recalls taking sulfa drugs in the past without problems.  3 days ago she started with tingling/stinging discomfort with urination and actually more constant in the vaginal area. Urine is cloudy, with a pungent odor. She is also having urinary urgency and frequency.  She had to get up 3x last night and voided 4x so far this morning.  Denies hematuria. Denies abdominal pain. Denies flank pain. Denies fever/chills. Denies any vaginal discharge, odor, or itch.  She was sitting more on Thursday and Friday (knitting project), and reports that this has contributed to developing UTI's in the past.  Patient is a diabetic. She recently cancelled her med check due to the COVID-19 pandemic. She has been baking more since she has been home, so sugars have  been higher (but she reports she increased Levemir to 120U to account for this, and sugars have been 122-128 in the mornings).  Lab Results  Component Value Date   HGBA1C 8.8 (A) 03/27/2018   PMH, PSH, SH reviewed    Outpatient Encounter Medications as of 08/20/2018  Medication Sig Note  . Ascorbic Acid (VITAMIN C PO) Take 1 tablet by mouth daily.   Marland Kitchen aspirin EC 81 MG tablet Take 81 mg by mouth at bedtime.   . BD PEN NEEDLE NANO U/F 32G X 4 MM MISC USE DAILY AS DIRECTED   . Coenzyme Q10 300 MG CAPS Take 300 mg by mouth daily.   . Cranberry (SM CRANBERRY) 300 MG tablet Take 300 mg by mouth every evening.   . fenofibrate (TRICOR) 145 MG tablet TAKE 1 TABLET BY MOUTH EVERY DAY   . fluticasone furoate-vilanterol (BREO ELLIPTA) 200-25 MCG/INH AEPB Inhale 1 puff into the lungs daily.   Marland Kitchen glucose blood test strip Pt uses relion meter- test bid for glucose montioring E11.9 Please dispense relion test strips.   Vanessa Kick Ethyl (VASCEPA) 1 g CAPS Take 2 capsules (2 g total) by mouth 2 (two) times daily.   . Insulin Pen Needle (BD PEN NEEDLE NANO U/F) 32G X 4 MM MISC Use as directed for insulin administration.   . isosorbide mononitrate (IMDUR) 30 MG 24 hr tablet Take 1 tablet (30 mg total) by mouth daily. ** DO NOT CRUSH **   . LEVEMIR FLEXTOUCH 100 UNIT/ML Pen INJECT 66 UNITS INTO THE  SKIN DAILY AT 10 PM, GO UP 2 UNITS EVERY 2 DAYS UNTIL MORNING IS 120   . metFORMIN (GLUCOPHAGE) 1000 MG tablet Take 1 tablet (1,000 mg total) by mouth 2 (two) times daily with a meal.   . metoprolol succinate (TOPROL-XL) 25 MG 24 hr tablet TAKE 1.5 TABLETS (37.5 MG TOTAL) BY MOUTH DAILY.   . Multiple Vitamin (MULTIVITAMIN WITH MINERALS) TABS Take 1 tablet by mouth daily.   Marland Kitchen RELION LANCETS THIN 26G MISC 1 Units by Does not apply route 2 (two) times daily. E 11.9   . rosuvastatin (CRESTOR) 20 MG tablet TAKE 1 TABLET BY MOUTH EVERY DAY   . valsartan (DIOVAN) 160 MG tablet Take 1 tablet (160 mg total) by mouth daily.    Marland Kitchen albuterol (PROVENTIL HFA;VENTOLIN HFA) 108 (90 Base) MCG/ACT inhaler Inhale 2 puffs into the lungs every 6 (six) hours as needed for wheezing or shortness of breath. (Patient not taking: Reported on 07/11/2018)   . NITROSTAT 0.4 MG SL tablet DISSOLVE 1 TAB UNDER THE TONGUE EVERY 5 MIN AS NEEDED FOR CHEST PAIN (MAX OF 3) (Patient not taking: Reported on 07/11/2018) 10/18/2016: Never used medication    . [DISCONTINUED] azithromycin (ZITHROMAX) 250 MG tablet Take 2 tablets by mouth on first day, then 1 tablet by mouth on days 2 through 5    Facility-Administered Encounter Medications as of 08/20/2018  Medication  . albuterol (PROVENTIL) (2.5 MG/3ML) 0.083% nebulizer solution 2.5 mg   Allergies  Allergen Reactions  . Penicillins Hives     PATIENT HAD A PCN REACTION WITH IMMEDIATE RASH, FACIAL/TONGUE/THROAT SWELLING, SOB, OR LIGHTHEADEDNESS WITH HYPOTENSION:  #  #  #  YES  #  #  #   Has patient had a PCN reaction causing severe rash involving mucus membranes or skin necrosis:Unknown Has patient had a PCN reaction that required hospitalization:No Has patient had a PCN reaction occurring within the last 10 years:No If all of the above answers are "NO", then may proceed with Cephalosporin use.    ROS:  No fever, chills, URI symptoms ,cough, shortness of breath, chest pain, GI complaints.  No vaginal discharge.  +urinary complaints as per HPI.  No rashes or other concerns.  Observations/Objective: BP (!) 152/84   Temp 98.2 F (36.8 C) (Oral)   Ht 5\' 5"  (1.651 m)   Wt 203 lb (92.1 kg)   BMI 33.78 kg/m   Exam is limited by virtual (telephone) nature of the visit.  She is alert, oriented, a good historian, in no distress, in good spirits.   Assessment and Plan:  Acute cystitis without hematuria - counseled re: expected course, red flags for which to contact us. Counseled re: hygiene, voiding after intercourse, avoid holding urine for prolonged times - Plan: sulfamethoxazole-trimethoprim  (BACTRIM DS) 800-160 MG tablet  Diabetes mellitus with complication ([place]) - needs to reschedule med check.  Last A1c was above goal, current sugars sound okay, even with UTI.  Hypertension associated with diabetes ([place]) - BP above goal today.  Continue monitoring.  Will make sure she sets up med check with Dr. Lincoln Bing DS BID x 7 d  R/s med check with [facility]  Follow Up Instructions:    I discussed the assessment and treatment plan with the patient. The patient was provided an opportunity to ask questions and all were answered. The patient agreed with the plan and demonstrated an understanding of the instructions.   The patient was advised to call back or seek an in-person  evaluation if the symptoms worsen or if the condition fails to improve as anticipated.  I provided 20 minutes of non-face-to-face time during this encounter.   Vikki Ports, MD

## 2018-08-20 NOTE — Patient Instructions (Signed)
Take the antibiotic twice daily for a week.  You should notice improvement in symptoms in 24-48 hours.  If your symptoms don't completely resolve, please let us know. Contact us if you develop fever, chills, vomiting, flank pain, blood in the urine, or other new concerns.  You are due to reschedule your med check.  Since your blood pressure was elevated today, please monitor it regularly and record the values, to have ready for you med check. Please cut back on the baking/sweets, so that you can keep your sugars in better control (and minimize insulin).   Urinary Tract Infection, Adult  A urinary tract infection (UTI) is an infection of any part of the urinary tract. The urinary tract includes the kidneys, ureters, bladder, and urethra. These organs make, store, and get rid of urine in the body. Your health care provider may use other names to describe the infection. An upper UTI affects the ureters and kidneys (pyelonephritis). A lower UTI affects the bladder (cystitis) and urethra (urethritis). What are the causes? Most urinary tract infections are caused by bacteria in your genital area, around the entrance to your urinary tract (urethra). These bacteria grow and cause inflammation of your urinary tract. What increases the risk? You are more likely to develop this condition if:  You have a urinary catheter that stays in place (indwelling).  You are not able to control when you urinate or have a bowel movement (you have incontinence).  You are female and you: ? Use a spermicide or diaphragm for birth control. ? Have low estrogen levels. ? Are pregnant.  You have certain genes that increase your risk (genetics).  You are sexually active.  You take antibiotic medicines.  You have a condition that causes your flow of urine to slow down, such as: ? An enlarged prostate, if you are female. ? Blockage in your urethra (stricture). ? A kidney stone. ? A nerve condition that affects your  bladder control (neurogenic bladder). ? Not getting enough to drink, or not urinating often.  You have certain medical conditions, such as: ? Diabetes. ? A weak disease-fighting system (immunesystem). ? Sickle cell disease. ? Gout. ? Spinal cord injury. What are the signs or symptoms? Symptoms of this condition include:  Needing to urinate right away (urgently).  Frequent urination or passing small amounts of urine frequently.  Pain or burning with urination.  Blood in the urine.  Urine that smells bad or unusual.  Trouble urinating.  Cloudy urine.  Vaginal discharge, if you are female.  Pain in the abdomen or the lower back. You may also have:  Vomiting or a decreased appetite.  Confusion.  Irritability or tiredness.  A fever.  Diarrhea. The first symptom in older adults may be confusion. In some cases, they may not have any symptoms until the infection has worsened. How is this diagnosed? This condition is diagnosed based on your medical history and a physical exam. You may also have other tests, including:  Urine tests.  Blood tests.  Tests for sexually transmitted infections (STIs). If you have had more than one UTI, a cystoscopy or imaging studies may be done to determine the cause of the infections. How is this treated? Treatment for this condition includes:  Antibiotic medicine.  Over-the-counter medicines to treat discomfort.  Drinking enough water to stay hydrated. If you have frequent infections or have other conditions such as a kidney stone, you may need to see a health care provider who specializes in the urinary tract (  urologist). In rare cases, urinary tract infections can cause sepsis. Sepsis is a life-threatening condition that occurs when the body responds to an infection. Sepsis is treated in the hospital with IV antibiotics, fluids, and other medicines. Follow these instructions at home:  Medicines  Take over-the-counter and  prescription medicines only as told by your health care provider.  If you were prescribed an antibiotic medicine, take it as told by your health care provider. Do not stop using the antibiotic even if you start to feel better. General instructions  Make sure you: ? Empty your bladder often and completely. Do not hold urine for long periods of time. ? Empty your bladder after sex. ? Wipe from front to back after a bowel movement if you are female. Use each tissue one time when you wipe.  Drink enough fluid to keep your urine pale yellow.  Keep all follow-up visits as told by your health care provider. This is important. Contact a health care provider if:  Your symptoms do not get better after 1-2 days.  Your symptoms go away and then return. Get help right away if you have:  Severe pain in your back or your lower abdomen.  A fever.  Nausea or vomiting. Summary  A urinary tract infection (UTI) is an infection of any part of the urinary tract, which includes the kidneys, ureters, bladder, and urethra.  Most urinary tract infections are caused by bacteria in your genital area, around the entrance to your urinary tract (urethra).  Treatment for this condition often includes antibiotic medicines.  If you were prescribed an antibiotic medicine, take it as told by your health care provider. Do not stop using the antibiotic even if you start to feel better.  Keep all follow-up visits as told by your health care provider. This is important. This information is not intended to replace advice given to you by your health care provider. Make sure you discuss any questions you have with your health care provider. Document Released: 01/19/2005 Document Revised: 10/19/2017 Document Reviewed: 10/19/2017 Elsevier Interactive Patient Education  2019 [employer].

## 2018-08-20 NOTE — Telephone Encounter (Signed)
Called pt to

## 2018-08-28 ENCOUNTER — Telehealth: Payer: Self-pay | Admitting: Family Medicine

## 2018-08-28 NOTE — Telephone Encounter (Signed)
Pt coming  In Thursday . [place]

## 2018-08-28 NOTE — Telephone Encounter (Signed)
Pt called and is still having problems with her UTI, still burning, still having to go the bathroom a lot, pt has finished the antiobtioc(bactrim) that was prescribed to her she is wanting to know if she can get something else to try and get rid of the UTI, she dont think the bactrim is helping [id number] [address], [place] pt can be reached at [phone]

## 2018-08-28 NOTE — Telephone Encounter (Signed)
Have her come in and give [place] a urine specimen so we know exactly what is going on.  Explained to her that we might need to send her to urology if she continues to have difficulty.

## 2018-08-29 ENCOUNTER — Telehealth: Payer: Self-pay | Admitting: Family Medicine

## 2018-08-29 NOTE — Telephone Encounter (Signed)
Pt was called concerning lab visit for tomorrow. Pt states she would like to get her diabetic test while she is here. Please advise pt.

## 2018-08-29 NOTE — Telephone Encounter (Signed)
Pt was made an appointment for tomorrow. [place]

## 2018-08-30 ENCOUNTER — Other Ambulatory Visit: Payer: Self-pay

## 2018-08-30 ENCOUNTER — Ambulatory Visit (INDEPENDENT_AMBULATORY_CARE_PROVIDER_SITE_OTHER): Payer: BC Managed Care – PPO | Admitting: Family Medicine

## 2018-08-30 ENCOUNTER — Encounter: Payer: Self-pay | Admitting: Family Medicine

## 2018-08-30 VITALS — BP 174/96 | HR 97 | Temp 81.0°F | Wt 201.8 lb

## 2018-08-30 DIAGNOSIS — E1159 Type 2 diabetes mellitus with other circulatory complications: Secondary | ICD-10-CM | POA: Diagnosis not present

## 2018-08-30 DIAGNOSIS — E785 Hyperlipidemia, unspecified: Secondary | ICD-10-CM

## 2018-08-30 DIAGNOSIS — E118 Type 2 diabetes mellitus with unspecified complications: Secondary | ICD-10-CM

## 2018-08-30 DIAGNOSIS — E1169 Type 2 diabetes mellitus with other specified complication: Secondary | ICD-10-CM

## 2018-08-30 DIAGNOSIS — E6609 Other obesity due to excess calories: Secondary | ICD-10-CM | POA: Diagnosis not present

## 2018-08-30 DIAGNOSIS — Z6834 Body mass index (BMI) 34.0-34.9, adult: Secondary | ICD-10-CM

## 2018-08-30 DIAGNOSIS — I2510 Atherosclerotic heart disease of native coronary artery without angina pectoris: Secondary | ICD-10-CM

## 2018-08-30 DIAGNOSIS — Z87440 Personal history of urinary (tract) infections: Secondary | ICD-10-CM

## 2018-08-30 DIAGNOSIS — IMO0002 Reserved for concepts with insufficient information to code with codable children: Secondary | ICD-10-CM

## 2018-08-30 DIAGNOSIS — I152 Hypertension secondary to endocrine disorders: Secondary | ICD-10-CM

## 2018-08-30 DIAGNOSIS — E1165 Type 2 diabetes mellitus with hyperglycemia: Secondary | ICD-10-CM

## 2018-08-30 DIAGNOSIS — I10 Essential (primary) hypertension: Secondary | ICD-10-CM

## 2018-08-30 LAB — POCT GLYCOSYLATED HEMOGLOBIN (HGB A1C): Hemoglobin A1C: 9.2 % — AB (ref 4.0–5.6)

## 2018-08-30 MED ORDER — INSULIN DETEMIR 100 UNIT/ML FLEXPEN: 120.0000 [IU] | PEN_INJECTOR | Freq: Every day | SUBCUTANEOUS | 1 refills | Status: DC

## 2018-08-30 NOTE — Progress Notes (Signed)
Subjective:    Patient ID: Haley George, female    DOB: 24-Jan-1956, 63 y.o.   MRN: [id number]  Haley George is a 63 y.o. female who presents for follow-up of Type 2 diabetes mellitus.  Patient is checking home blood sugars.   Home blood sugar records: pt writes them down forgot log.  She has increased her insulin dosing up to 120 units daily.  She is doing this all at 1 time.  She continues on metformin. How often is blood sugars being checked: Qd am fasting 2 hours post meal Current symptoms/problems include changes in blood sugar readings. Daily foot checks: yes   Any foot concerns: none at this time Last eye exam: 05/2017 Exercise: yard work, not that much. She does have underlying heart disease and is being cared for by Dr. Loletha Grayer she is taking Imdur as well as Toprol and Diovan. Presently she is taking fenofibrate, Vascepa as well as Crestor.  She is having no difficulty with them.  Recent blood work concerning her enzymes looks good. She also has had difficulty over the last year with urinary tract infections.  She was treated recently with Septra.  She did have symptoms of frequency and dysuria but within the last several days this has diminished. the following portions of the patient's history were reviewed and updated as appropriate: allergies, current medications, past medical history, past social history and problem list.  ROS as in subjective above.     Objective:    Physical Exam Alert and in no distress otherwise not examined.  Blood pressure (!) 174/96, pulse 97, temperature (!) 81 F (27.2 C), weight 201 lb 12.8 oz (91.5 kg), SpO2 97 %.  Lab Review Diabetic Labs Latest Ref Rng & Units 03/27/2018 12/19/2017 03/13/2017 01/30/2017 10/18/2016  HbA1c 4.0 - 5.6 % 8.8(A) 9.2(A) 7.3 - 10.5(H)  Microalbumin mg/L - - 5.9 - -  Micro/Creat Ratio - - - 18.3 - -  Chol 100 - 199 mg/dL 173 - - - -  HDL >39 mg/dL 21(L) - - - -  Calc LDL 0 - 99 mg/dL Comment - - - -  Triglycerides 0 -  149 mg/dL 487(H) - - - -  Creatinine 0.57 - 1.00 mg/dL 1.05(H) - - 1.18(H) 0.99   BP/Weight 08/30/2018 08/20/2018 07/11/2018 04/27/2018 99/24/2683  Systolic BP 419 622 297 989 211  Diastolic BP 96 84 80 76 80  Wt. (Lbs) 201.8 203 200.6 206 201  BMI 33.58 33.78 33.38 34.28 33.45   Foot/eye exam completion dates Latest Ref Rng & Units 03/27/2018 02/12/2016  Eye Exam No Retinopathy - No Retinopathy  Foot Form Completion - Done -  A1c is 9.2 urine dipstick was negative  [name]  reports that she has never smoked. She has never used smokeless tobacco. She reports that she does not drink alcohol or use drugs.     Assessment & Plan:    Diabetes mellitus with complication ([place]) - Plan: POCT glycosylated hemoglobin (Hb A1C), Ambulatory referral to Endocrinology, Amb Ref to Medical Weight Management, Insulin Detemir (LEVEMIR FLEXTOUCH) 100 UNIT/ML Pen  Diabetes mellitus type 2, uncontrolled, with complications ([facility])  Class 1 obesity due to excess calories with serious comorbidity and body mass index (BMI) of 34.0 to 34.9 in adult - Plan: Ambulatory referral to Endocrinology, Amb Ref to Medical Weight Management  Hyperlipidemia associated with type 2 diabetes mellitus ([place]) - Plan: Lipid panel  Hypertension associated with diabetes ([place])  ASHD (arteriosclerotic heart disease)  History of UTI  1. Rx changes: none 2. Education: Reviewed 'ABCs' of diabetes management (respective goals in parentheses):  A1C (<7), blood pressure (<130/80), and cholesterol (LDL <100). 3. Compliance at present is estimated to be poor. Efforts to improve compliance (if necessary) will be directed at It is time to refer to endocrinology and medical weight loss. 4. Follow up: 6 months I explained that I thought the UTI was behind her.  She has had 3 in the last year but do not feel the need to refer to urology. Her diabetes is not under adequate control and I have decided to refer her to endocrinology for further care of  this.  Also discussed getting involved in medical weight loss and will make that referral as well.

## 2018-08-31 LAB — LIPID PANEL
Chol/HDL Ratio: 9.5 ratio — ABNORMAL HIGH (ref 0.0–4.4)
Cholesterol, Total: 143 mg/dL (ref 100–199)
HDL: 15 mg/dL — ABNORMAL LOW
Triglycerides: 598 mg/dL (ref 0–149)

## 2018-09-10 ENCOUNTER — Ambulatory Visit: Payer: BC Managed Care – PPO | Admitting: Internal Medicine

## 2018-09-10 ENCOUNTER — Telehealth: Payer: Self-pay | Admitting: Internal Medicine

## 2018-09-10 NOTE — Progress Notes (Deleted)
Patient ID: Haley George, female   DOB: 1955/12/26, 63 y.o.   MRN: [id number]   HPI: Haley George is a 63 y.o.-year-old female, referred by her PCP, Dr. Redmond School, for management of DM2, dx in ***, insulin-dependent, uncontrolled, with complications (CAD, s/p AMI, s/p stents, Ao valve stenosis, CKD).  Reviewed latest HbA1c level: Lab Results  Component Value Date   HGBA1C 9.2 (A) 08/30/2018   HGBA1C 8.8 (A) 03/27/2018   HGBA1C 9.2 (A) 12/19/2017   HGBA1C 7.3 03/13/2017   HGBA1C 10.5 (H) 10/18/2016   HGBA1C 7.1 07/12/2016   HGBA1C 6.8 02/10/2015   HGBA1C 7.9 10/02/2014   HGBA1C 7.8 05/06/2014   HGBA1C 7.1 (H) 01/03/2014   Pt is on a regimen of: -Metformin 1000 mg twice a day -Levemir 120 units at bedtime  Pt checks her sugars *** a day and they are: - am: n/c - 2h after b'fast: n/c - before lunch: n/c - 2h after lunch: n/c - before dinner: n/c - 2h after dinner: n/c - bedtime: n/c - nighttime: n/c Lowest sugar was ***; she has hypoglycemia awareness at 70.  Highest sugar was ***.  Glucometer: ReliOn  Pt's meals are: - Breakfast: - Lunch: - Dinner: - Snacks:  - + CKD, last BUN/creatinine:  Lab Results  Component Value Date   BUN 18 03/27/2018   BUN 17 01/30/2017   CREATININE 1.05 (H) 03/27/2018   CREATININE 1.18 (H) 01/30/2017  On Diovan 160.  - + HL; last set of lipids: Lab Results  Component Value Date   CHOL 143 08/30/2018   HDL 15 (L) 08/30/2018   LDLCALC Comment 08/30/2018   LDLDIRECT 93 11/15/2012   TRIG 598 (HH) 08/30/2018   CHOLHDL 9.5 (H) 08/30/2018  On fenofibrate + Vascepa + Crestor 20  - last eye exam was in 05/2017. No DR.   - no numbness and tingling in her feet.  Pt has FH of DM in ***.  She also has a history of autoimmune hepatitis.  ROS: Constitutional: no weight gain, no weight loss, no fatigue, no subjective hyperthermia, no subjective hypothermia, no nocturia Eyes: no blurry vision, no xerophthalmia ENT: no sore throat, no  nodules palpated in neck, no dysphagia, no odynophagia, no hoarseness, no tinnitus, no hypoacusis Cardiovascular: no CP, no SOB, no palpitations, no leg swelling Respiratory: no cough, no SOB, no wheezing Gastrointestinal: no N, no V, no D, no C, no acid reflux Musculoskeletal: no muscle, no joint aches Skin: no rash, no hair loss Neurological: no tremors, no numbness or tingling/no dizziness/no HAs Psychiatric: no depression, no anxiety  PE: There were no vitals taken for this visit. Wt Readings from Last 3 Encounters:  08/30/18 201 lb 12.8 oz (91.5 kg)  08/20/18 203 lb (92.1 kg)  07/11/18 200 lb 9.6 oz (91 kg)   Constitutional: overweight, in NAD Eyes: PERRLA, EOMI, no exophthalmos ENT: moist mucous membranes, no thyromegaly, no cervical lymphadenopathy Cardiovascular: RRR, No MRG Respiratory: CTA B Gastrointestinal: abdomen soft, NT, ND, BS+ Musculoskeletal: no deformities, strength intact in all 4 Skin: moist, warm, no rashes Neurological: no tremor with outstretched hands, DTR normal in all 4  ASSESSMENT: 1. DM2, insulin-dependent, uncontrolled, with complications - CAD, s/p AMI, s/p stents - Dr. Sallyanne Kuster - Ao valve stenosis - CKD  PLAN:  1. Patient with long-standing, uncontrolled diabetes, on metformin high-dose long-acting insulin, which became insufficient - I suggested to:  There are no Patient Instructions on file for this visit. - Strongly advised her to start checking sugars  at different times of the day - check 1x a day, rotating checks - discussed about CBG targets for treatment: 80-130 mg/dL before meals and <180 mg/dL after meals; target HbA1c <7%. - given sugar log and advised how to fill it and to bring it at next appt  - given foot care handout and explained the principles  - given instructions for hypoglycemia management "15-15 rule"  - advised for yearly eye exams  - Return to clinic in 3 mo with sugar log   Haley Kingdom, MD PhD [facility]  Endocrinology

## 2018-09-10 NOTE — Telephone Encounter (Signed)
Patient called and had an issue.  Patient stated that prior to her telephone visit with NP on 08/08/2018 that she had asked if she would be charged her $80 copay and that she was told no.  Patient stated she didn't really need a visit and that all she needed was a refill of Breo and was told she had to have a visit to get a refill.  She is upset that she asked about the copay and received the bill for this. Patient stated that she would have just not gotten the Breo/completed the e-visit if she would have know.  She shared that her husband is having a big surgery and she would have been fine without the medication in the long run. She would like to speak to someone in billing.   Routing to [employer]. Kathlee Nations please call the patient, thanks!

## 2018-09-11 NOTE — Telephone Encounter (Addendum)
Discussed with Jackson Latino, there is not much we can do about this, due to rapidly changing rules for tele-health due to COVID, we could not predict how insurance was going to process claims.  Attempted to call patient back, left voicemail.  Spoke with Maryann Conners, she spoke with Jackson Latino, explained the situation further.  Per Jackson Latino she should not have needed this visit to get her refill.  Sent request for this visit to be changed to a no charge.  Called the patient and updated her on the status.

## 2018-09-13 ENCOUNTER — Telehealth: Payer: Self-pay | Admitting: Cardiovascular Disease

## 2018-09-13 NOTE — Telephone Encounter (Signed)
New message:   Patient calling stating some one called her. I did not see a message. Please call patient.

## 2018-09-13 NOTE — Telephone Encounter (Signed)
CALLED PATIENT - PHONE RINGS THEN  HANGS UP  NOT SURE HOW CALLED PATIENT WILL SEND TO SCHEDULER IT LOOKS AS IF APPT SCHEDULER FOR July 2020

## 2018-10-05 ENCOUNTER — Other Ambulatory Visit: Payer: Self-pay | Admitting: Family Medicine

## 2018-10-05 DIAGNOSIS — E118 Type 2 diabetes mellitus with unspecified complications: Secondary | ICD-10-CM

## 2018-10-16 ENCOUNTER — Other Ambulatory Visit: Payer: Self-pay

## 2018-10-18 ENCOUNTER — Encounter: Payer: Self-pay | Admitting: Internal Medicine

## 2018-10-18 ENCOUNTER — Ambulatory Visit: Payer: BC Managed Care – PPO | Admitting: Internal Medicine

## 2018-10-18 ENCOUNTER — Other Ambulatory Visit: Payer: Self-pay

## 2018-10-18 DIAGNOSIS — E118 Type 2 diabetes mellitus with unspecified complications: Secondary | ICD-10-CM

## 2018-10-18 MED ORDER — LEVEMIR FLEXTOUCH 100 UNIT/ML ~~LOC~~ SOPN: PEN_INJECTOR | SUBCUTANEOUS | 3 refills | Status: DC

## 2018-10-18 MED ORDER — OZEMPIC (0.25 OR 0.5 MG/DOSE) 2 MG/1.5ML ~~LOC~~ SOPN: 0.5000 mg | PEN_INJECTOR | SUBCUTANEOUS | 5 refills | Status: DC

## 2018-10-18 MED ORDER — BD PEN NEEDLE NANO U/F 32G X 4 MM MISC: 3 refills | Status: DC

## 2018-10-18 NOTE — Patient Instructions (Signed)
Please continue: - Metformin 1000 mg 2x a day with meals  Please start Ozempic 0.25 mg weekly in a.m. (for example on Monday morning) x 4 weeks, then increase to 0.5 mg weekly in a.m. if no nausea or hypoglycemia.  Decrease: - Levemir to 70 units at bedtime  Please come back for a follow-up appointment in 3 months.  PATIENT INSTRUCTIONS FOR TYPE 2 DIABETES:  **Please join MyChart!** - see attached instructions about how to join if you have not done so already.  DIET AND EXERCISE Diet and exercise is an important part of diabetic treatment.  We recommended aerobic exercise in the form of brisk walking (working between 40-60% of maximal aerobic capacity, similar to brisk walking) for 150 minutes per week (such as 30 minutes five days per week) along with 3 times per week performing 'resistance' training (using various gauge rubber tubes with handles) 5-10 exercises involving the major muscle groups (upper body, lower body and core) performing 10-15 repetitions (or near fatigue) each exercise. Start at half the above goal but build slowly to reach the above goals. If limited by weight, joint pain, or disability, we recommend daily walking in a swimming pool with water up to waist to reduce pressure from joints while allow for adequate exercise.    BLOOD GLUCOSES Monitoring your blood glucoses is important for continued management of your diabetes. Please check your blood glucoses 2-4 times a day: fasting, before meals and at bedtime (you can rotate these measurements - e.g. one day check before the 3 meals, the next day check before 2 of the meals and before bedtime, etc.).   HYPOGLYCEMIA (low blood sugar) Hypoglycemia is usually a reaction to not eating, exercising, or taking too much insulin/ other diabetes drugs.  Symptoms include tremors, sweating, hunger, confusion, headache, etc. Treat IMMEDIATELY with 15 grams of Carbs: . 4 glucose tablets .  cup regular juice/soda . 2 tablespoons  raisins . 4 teaspoons sugar . 1 tablespoon honey Recheck blood glucose in 15 mins and repeat above if still symptomatic/blood glucose <100.  RECOMMENDATIONS TO REDUCE YOUR RISK OF DIABETIC COMPLICATIONS: * Take your prescribed MEDICATION(S) * Follow a DIABETIC diet: Complex carbs, fiber rich foods, (monounsaturated and polyunsaturated) fats * AVOID saturated/trans fats, high fat foods, >2,300 mg salt per day. * EXERCISE at least 5 times a week for 30 minutes or preferably daily.  * DO NOT SMOKE OR DRINK more than 1 drink a day. * Check your FEET every day. Do not wear tightfitting shoes. Contact us if you develop an ulcer * See your EYE doctor once a year or more if needed * Get a FLU shot once a year * Get a PNEUMONIA vaccine once before and once after age 3 years  GOALS:  * Your Hemoglobin A1c of <7%  * fasting sugars need to be <130 * after meals sugars need to be <180 (2h after you start eating) * Your Systolic BP should be 785 or lower  * Your Diastolic BP should be 80 or lower  * Your HDL (Good Cholesterol) should be 40 or higher  * Your LDL (Bad Cholesterol) should be 100 or lower. * Your Triglycerides should be 150 or lower  * Your Urine microalbumin (kidney function) should be <30 * Your Body Mass Index should be 25 or lower    Please consider the following ways to cut down carbs and fat and increase fiber and micronutrients in your diet: - substitute whole grain for white bread or pasta -  substitute brown rice for white rice - substitute 90-calorie flat bread pieces for slices of bread when possible - substitute sweet potatoes or yams for white potatoes - substitute humus for margarine - substitute tofu for cheese when possible - substitute almond or rice milk for regular milk (would not drink soy milk daily due to concern for soy estrogen influence on breast cancer risk) - substitute dark chocolate for other sweets when possible - substitute water - can add lemon or  orange slices for taste - for diet sodas (artificial sweeteners will trick your body that you can eat sweets without getting calories and will lead you to overeating and weight gain in the long run) - do not skip breakfast or other meals (this will slow down the metabolism and will result in more weight gain over time)  - can try smoothies made from fruit and almond/rice milk in am instead of regular breakfast - can also try old-fashioned (not instant) oatmeal made with almond/rice milk in am - order the dressing on the side when eating salad at a restaurant (pour less than half of the dressing on the salad) - eat as little meat as possible - can try juicing, but should not forget that juicing will get rid of the fiber, so would alternate with eating raw veg./fruits or drinking smoothies - use as little oil as possible, even when using olive oil - can dress a salad with a mix of balsamic vinegar and lemon juice, for e.g. - use agave nectar, stevia sugar, or regular sugar rather than artificial sweateners - steam or broil/roast veggies  - snack on veggies/fruit/nuts (unsalted, preferably) when possible, rather than processed foods - reduce or eliminate aspartame in diet (it is in diet sodas, chewing gum, etc) Read the labels!  Try to read Dr. Janene Harvey book: "Program for Reversing Diabetes" for other ideas for healthy eating.

## 2018-10-18 NOTE — Progress Notes (Signed)
Patient ID: Haley George, female   DOB: [birth year], 63 y.o.   MRN: [id number]   HPI: Haley George is a 63 y.o.-year-old female, referred by her PCP, Dr. Redmond School, for management of DM2, dx initially in 1997 2/2 liver failure, then GDM, then re-occuring in 2007, insulin-dependent, uncontrolled, with long-term complications (CAD-s/p AMI, s/p stents, aortic valve stenosis; CKD).  Reviewed latest HbA1c level: Lab Results  Component Value Date   HGBA1C 9.2 (A) 08/30/2018   HGBA1C 8.8 (A) 03/27/2018   HGBA1C 9.2 (A) 12/19/2017   HGBA1C 7.3 03/13/2017   HGBA1C 10.5 (H) 10/18/2016   HGBA1C 7.1 07/12/2016   HGBA1C 6.8 02/10/2015   HGBA1C 7.9 10/02/2014   HGBA1C 7.8 05/06/2014   HGBA1C 7.1 (H) 01/03/2014  She was on steroid injections in 2019.  Pt is on a regimen of: - Metformin 1000 mg 2x a day, with meals - Levemir 140 (70 units x2) units at bedtime She was on Actos before.  Pt checks her sugars 2x a day and they are: - am: 128-200 - 2h after b'fast: n/c - before lunch: n/c - 2h after lunch: n/c - before dinner: n/c - 2h after dinner: up to 300 - bedtime: n/c - nighttime: n/c Lowest sugar was 40 - 2012, since then 109, but usually 121; she has hypoglycemia awareness at 70.  Highest sugar was 368.  Glucometer: ReliOn  Pt's meals are: - Breakfast: if not skipping - egg + whole wheat toast; cheerios + milk - Lunch: chicken salad, apple, veggies, trisquits - Dinner: meat + 2 veggies +/- rolls - Snacks: water, cranberry juice, after dinner: pretzels, nut mix, popcorn  - no CKD, last BUN/creatinine:  Lab Results  Component Value Date   BUN 18 03/27/2018   BUN 17 01/30/2017   CREATININE 1.05 (H) 03/27/2018   CREATININE 1.18 (H) 01/30/2017  On Diovan 160.  - + HL; last set of lipids: Lab Results  Component Value Date   CHOL 143 08/30/2018   HDL 15 (L) 08/30/2018   LDLCALC Comment 08/30/2018   LDLDIRECT 93 11/15/2012   TRIG 598 (HH) 08/30/2018   CHOLHDL 9.5 (H)  08/30/2018  On fenofibrate, Vascepa, Crestor 20.  - last eye exam was in 05/2017. No DR.   - no numbness and tingling in her feet.  Pt has FH of DM in M, M uncle, MGM.  She also has a history of autoimmune hepatitis.  She lost a signif. amount of weight on [employer] in 2018.  She also has a history of psoriasis diagnosed as a child.  ROS: Constitutional: + Weight gain, no weight loss, no fatigue, no subjective hyperthermia, no subjective hypothermia, no nocturia, + poor sleep Eyes: no blurry vision, no xerophthalmia ENT: no sore throat, no nodules palpated in neck, no dysphagia, no odynophagia, no hoarseness, + tinnitus, no hypoacusis Cardiovascular: no CP, no SOB, + palpitations, no leg swelling Respiratory: no cough, no SOB, no wheezing Gastrointestinal: no N, no V, no D, no C, no acid reflux Musculoskeletal: + Muscle, + joint aches Skin: + Macular rash due to psoriasis - on legs, no hair loss Neurological: no tremors, no numbness or tingling/no dizziness/no HAs Psychiatric: no depression, no anxiety  Past Medical History:  Diagnosis Date  . Asthma   . Cancer ([place])    basal cell chest  . Complication of anesthesia   . Coronary artery disease   . Depression   . Diabetes mellitus without complication ([place])    type 2  .  Heart murmur   . Hepatitis    auto- immune  . History of kidney stones   . History of nuclear stress test 05/21/2010   dipyridamole; normal perfusion, preserved LV systolic EF of 73%  . Hyperlipidemia   . Hypertension   . Liver failure ([place])    h/o autoimmune hepatitis   . Myocardial infarction ([place])   . Neuropathy    tingling and burning left leg to foot  . Obesity   . PONV (postoperative nausea and vomiting)   . Psoriasis   . Tubular adenoma of colon    colon path 01/09/2017   Past Surgical History:  Procedure Laterality Date  . Carotid Doppler  10/2008   R & L ICAs 0-49% diameter reduction   . COLONOSCOPY    . CORONARY ANGIOPLASTY WITH  STENT PLACEMENT  09/2008   2.25x106mm Cypher DES to in-stent restenosis of prox LAD; Mini-Vision stent x2 2.0x64mm to area distal of initial LAD stent; 3 2.5x43mm Taxus stents prox to Cypher stent in circumflex  . CORONARY ANGIOPLASTY WITH STENT PLACEMENT  09/2005   Cypher DES 3.0x38mm to distal RCA; 2.25x68mm Mini0Vision stent to mid LAD; 2.5x39mm Cypher stent to circumflex  . LUMBAR LAMINECTOMY/DECOMPRESSION MICRODISCECTOMY Left 10/18/2016   Procedure: Left Lumbar Four-Five Microdiscectomy;  Surgeon: Erline Levine, MD;  Location: [facility];  Service: Neurosurgery;  Laterality: Left;  Left L4-5 Microdiscectomy  . ROTATOR CUFF REPAIR Left   . SHOULDER ARTHROSCOPY W/ ROTATOR CUFF REPAIR Right   . TRANSTHORACIC ECHOCARDIOGRAM  04/2010   EF=>55%, borderline conc LVH; trace MR & TR; AV mildly sclerotic; aortic root sclerosis/calcif  . ureteral stents     Social History   Socioeconomic History  . Marital status: Single    Spouse name: Not on file  . Number of children: 1  . Years of education: Not on file  . 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 Financial resource strain: Not on file  . Food insecurity    Worry: Not on file    Inability: Not on file  . Transportation needs    Medical: Not on file    Non-medical: Not on file  Tobacco Use  . Smoking status: Never Smoker  . Smokeless tobacco: Never Used  Substance and Sexual Activity  . Alcohol use: No  . Drug use: No  . Sexual activity: Not Currently  Lifestyle  . Physical activity    Days per week: Not on file    Minutes per session: Not on file  . Stress: Not on file  Relationships  . Social Herbalist on phone: Not on file    Gets together: Not on file    Attends religious service: Not on file    Active member of club or organization: Not on file    Attends meetings of clubs or organizations: Not on  file    Relationship status: Not on file  . Intimate partner violence    Fear of current or ex partner: Not on file    Emotionally abused: Not on file    Physically abused: Not on file    Forced sexual activity: Not on file  Other Topics Concern  . Not on file  Social History Narrative  . Not on file   Current Outpatient Medications on File Prior to Visit  Medication Sig Dispense Refill  . Ascorbic Acid (VITAMIN C PO)  Take 1 tablet by mouth daily.    Marland Kitchen aspirin EC 81 MG tablet Take 81 mg by mouth at bedtime.    . Coenzyme Q10 300 MG CAPS Take 300 mg by mouth daily.    . Cranberry (SM CRANBERRY) 300 MG tablet Take 300 mg by mouth every evening.    . fenofibrate (TRICOR) 145 MG tablet TAKE 1 TABLET BY MOUTH EVERY DAY 90 tablet 3  . glucose blood test strip Pt uses relion meter- test bid for glucose montioring E11.9 Please dispense relion test strips. 100 each 12  . Icosapent Ethyl (VASCEPA) 1 g CAPS Take 2 capsules (2 g total) by mouth 2 (two) times daily. 120 capsule 11  . Insulin Pen Needle (BD PEN NEEDLE NANO U/F) 32G X 4 MM MISC Use as directed for insulin administration. 100 each 1  . isosorbide mononitrate (IMDUR) 30 MG 24 hr tablet Take 1 tablet (30 mg total) by mouth daily. ** DO NOT CRUSH ** 90 tablet 3  . LEVEMIR FLEXTOUCH 100 UNIT/ML Pen INJECT 120 UNITS INTO THE SKIN DAILY. 15 mL 1  . metFORMIN (GLUCOPHAGE) 1000 MG tablet Take 1 tablet (1,000 mg total) by mouth 2 (two) times daily with a meal. 180 tablet 3  . metoprolol succinate (TOPROL-XL) 25 MG 24 hr tablet TAKE 1.5 TABLETS (37.5 MG TOTAL) BY MOUTH DAILY. 135 tablet 3  . Multiple Vitamin (MULTIVITAMIN WITH MINERALS) TABS Take 1 tablet by mouth daily.    Marland Kitchen RELION LANCETS THIN 26G MISC 1 Units by Does not apply route 2 (two) times daily. E 11.9 100 each 12  . rosuvastatin (CRESTOR) 20 MG tablet TAKE 1 TABLET BY MOUTH EVERY DAY 90 tablet 3  . valsartan (DIOVAN) 160 MG tablet Take 1 tablet (160 mg total) by mouth daily. 90 tablet  3  . albuterol (PROVENTIL HFA;VENTOLIN HFA) 108 (90 Base) MCG/ACT inhaler Inhale 2 puffs into the lungs every 6 (six) hours as needed for wheezing or shortness of breath. (Patient not taking: Reported on 07/11/2018) 1 Inhaler 2  . fluticasone furoate-vilanterol (BREO ELLIPTA) 200-25 MCG/INH AEPB Inhale 1 puff into the lungs daily. (Patient not taking: Reported on 10/18/2018) 1 each 3  . NITROSTAT 0.4 MG SL tablet DISSOLVE 1 TAB UNDER THE TONGUE EVERY 5 MIN AS NEEDED FOR CHEST PAIN (MAX OF 3) (Patient not taking: Reported on 07/11/2018) 25 tablet 1   Current Facility-Administered Medications on File Prior to Visit  Medication Dose Route Frequency Provider Last Rate Last Dose  . albuterol (PROVENTIL) (2.5 MG/3ML) 0.083% nebulizer solution 2.5 mg  2.5 mg Nebulization Once Rita Ohara, MD       Allergies  Allergen Reactions  . Penicillins Hives     PATIENT HAD A PCN REACTION WITH IMMEDIATE RASH, FACIAL/TONGUE/THROAT SWELLING, SOB, OR LIGHTHEADEDNESS WITH HYPOTENSION:  #  #  #  YES  #  #  #   Has patient had a PCN reaction causing severe rash involving mucus membranes or skin necrosis:Unknown Has patient had a PCN reaction that required hospitalization:No Has patient had a PCN reaction occurring within the last 10 years:No If all of the above answers are "NO", then may proceed with Cephalosporin use.    Family History  Problem Relation Age of Onset  . Diabetes Brother     PE: BP 122/70   Pulse 87   Ht 5\' 5"  (1.651 m)   Wt 203 lb (92.1 kg)   SpO2 97%   BMI 33.78 kg/m  Wt Readings from Last 3  Encounters:  10/18/18 203 lb (92.1 kg)  08/30/18 201 lb 12.8 oz (91.5 kg)  08/20/18 203 lb (92.1 kg)   Constitutional: overweight, in NAD Eyes: PERRLA, EOMI, no exophthalmos ENT: moist mucous membranes, no thyromegaly, no cervical lymphadenopathy Cardiovascular: RRR, No RG, +2/6 SEM Respiratory: CTA B Gastrointestinal: abdomen soft, NT, ND, BS+ Musculoskeletal: no deformities, strength intact in  all 4 Skin: moist, warm, no rashes Neurological: no tremor with outstretched hands, DTR normal in all 4  ASSESSMENT: 1. DM2, insulin-dependent, uncontrolled, with long-term complications - CAD-s/p AMI, s/p stents, aortic valve stenosis - CKD  PLAN:  1. Patient with long-standing, uncontrolled diabetes, on oral antidiabetic regimen + a large dose of long-acting insulin, which became insufficient.  Latest HbA1c (from 08/2018) was reviewed and this was high, at 9.2%. -At this visit, she tells me that she really would not want to be here today.  She feels that her body is working against her and it is difficult for her to put more effort into her diabetes control.  She saw nutrition in the past and this did not help.  She also was checking sugars and writing them down before, but does not want to do this anymore.  We discussed that I can help her gain control of her diabetes but I will need her help.  We discussed about improving her diet, although reducing her insulin resistance and I made specific suggestions.  She has tried a low-carb diet before which helped her lose weight in the past, but her diabetes did not improve.  I did not suggest a low-carb, but a lower fat diet and we discussed about options for healthier meals.  I also advised her to stop cranberry juice.  She cannot drink diet juice since she cannot tolerate aspartame.  She would not want to start any exercise program and for now I advised her  to start with improving her diet. -Also, we discussed about correct injection techniques, how to rotate the sites and what alternative sites to use.  As of now, she is injecting the middle of her abdomen, within a small region, so I suspect that she has quite significant scar tissue buildup and poor absorption.  At today's visit I will reduce her Levemir dose.  We also discussed about continuing Metformin and using a GLP-1 receptor agonist to help with the staircase pattern of increasing blood sugars  throughout the day.  Will try Ozempic if this is covered by her insurance.  We discussed about benefits and possible side effects.  She agrees to try this.  She tells me that her husband is on Jardiance (she is concerned about yeast infections) and her daughter is on Trulicity (she has nausea, no, after each injection). - I suggested to:  Patient Instructions  Please continue: - Metformin 1000 mg 2x a day with meals  Please start Ozempic 0.25 mg weekly in a.m. (for example on Monday morning) x 4 weeks, then increase to 0.5 mg weekly in a.m. if no nausea or hypoglycemia.  Decrease: - Levemir to 70 units at bedtime  Please come back for a follow-up appointment in 3 months.  - Continue checking sugars at different times of the day - check 1-2x a day, rotating checks - discussed about CBG targets for treatment: 80-130 mg/dL before meals and <180 mg/dL after meals; target HbA1c <7%. - given sugar log and advised how to fill it and to bring it at next appt  - given foot care handout and explained the  principles  - given instructions for hypoglycemia management "15-15 rule"  - advised for yearly eye exams  - Return to clinic in 3 mo with sugar log-she would not want to return sooner  [name], MD PhD [facility] End[facility]

## 2018-11-05 ENCOUNTER — Telehealth: Payer: Self-pay | Admitting: *Deleted

## 2018-11-05 NOTE — Telephone Encounter (Signed)

## 2018-11-06 ENCOUNTER — Ambulatory Visit: Payer: BC Managed Care – PPO | Admitting: Cardiovascular Disease

## 2018-11-06 ENCOUNTER — Other Ambulatory Visit: Payer: Self-pay | Admitting: Family Medicine

## 2018-11-06 ENCOUNTER — Telehealth: Payer: Self-pay | Admitting: Family Medicine

## 2018-11-06 ENCOUNTER — Other Ambulatory Visit: Payer: Self-pay

## 2018-11-06 VITALS — BP 162/89 | HR 92 | Temp 99.2°F | Ht 65.0 in | Wt 201.6 lb

## 2018-11-06 DIAGNOSIS — I358 Other nonrheumatic aortic valve disorders: Secondary | ICD-10-CM | POA: Diagnosis not present

## 2018-11-06 DIAGNOSIS — E1159 Type 2 diabetes mellitus with other circulatory complications: Secondary | ICD-10-CM

## 2018-11-06 DIAGNOSIS — E1165 Type 2 diabetes mellitus with hyperglycemia: Secondary | ICD-10-CM

## 2018-11-06 DIAGNOSIS — E6609 Other obesity due to excess calories: Secondary | ICD-10-CM | POA: Diagnosis not present

## 2018-11-06 DIAGNOSIS — J4530 Mild persistent asthma, uncomplicated: Secondary | ICD-10-CM

## 2018-11-06 DIAGNOSIS — E1169 Type 2 diabetes mellitus with other specified complication: Secondary | ICD-10-CM

## 2018-11-06 DIAGNOSIS — Z6834 Body mass index (BMI) 34.0-34.9, adult: Secondary | ICD-10-CM

## 2018-11-06 DIAGNOSIS — I25118 Atherosclerotic heart disease of native coronary artery with other forms of angina pectoris: Secondary | ICD-10-CM

## 2018-11-06 DIAGNOSIS — E118 Type 2 diabetes mellitus with unspecified complications: Secondary | ICD-10-CM | POA: Diagnosis not present

## 2018-11-06 DIAGNOSIS — E785 Hyperlipidemia, unspecified: Secondary | ICD-10-CM

## 2018-11-06 DIAGNOSIS — IMO0002 Reserved for concepts with insufficient information to code with codable children: Secondary | ICD-10-CM

## 2018-11-06 DIAGNOSIS — I10 Essential (primary) hypertension: Secondary | ICD-10-CM

## 2018-11-06 MED ORDER — LEVEMIR FLEXTOUCH 100 UNIT/ML ~~LOC~~ SOPN: PEN_INJECTOR | SUBCUTANEOUS | 3 refills | Status: DC

## 2018-11-06 NOTE — Progress Notes (Signed)
Patient ID: Haley George, female   DOB: July 19, 1955, 63 y.o.   MRN: [id number]    Cardiology Office Note    Date:  11/08/2018   ID:  Haley George, DOB [birth year], MRN [id number]  PCP:  Denita Lung, MD  Cardiologist:   Sanda Klein, MD   Chief Complaint  Patient presents with  . Coronary Artery Disease    History of Present Illness:  Haley George is a 63 y.o. female with previous percutaneous revascularization procedures performed in all 3 major coronary arteries and restenosis requiring repeat PCI.   Echo June 2018 showed normal left ventricular systolic function, EF 85-46% and mild diastolic dysfunction without elevated filling pressures.  She has a murmur which is due to aortic valve sclerosis.  She is generally doing quite well.  After undergoing stem cell injections in her right knee and right hip she is noticed that she is able to increase her exercise level.  Subsequently, she has noticed that when she climbs the stairs she gets a little winded and occasionally feels a vague chest discomfort.  This does not have interfere in any way with her activities and overall she feels better than she did a year ago.  The patient specifically denies any chest pain or dyspnea at rest, orthopnea, paroxysmal nocturnal dyspnea, syncope, palpitations, focal neurological deficits, intermittent claudication, lower extremity edema, unexplained weight gain, cough, hemoptysis or wheezing.  Control over metabolic risk factors remains a big challenge.  She's only recently started taking the Vascepa, so the most recent lipid profile is not really representative of therapy.  The triglycerides are severely elevated at 598.  Her LDL is 91 and her HDL was only 15.  She is still taking rosuvastatin and fenofibrate as well.  This week she received her first dose of Ozempic.  Her last hemoglobin A1c in May was 9.2%.  She remains mildly obese.  She reports that at home her blood pressure is consistently well  controlled in the 120s/60-70 range.  On June 25 at the endocrinologist her blood pressure was 122/70.  Her blood pressure is a little high today, but this is atypical.   The last interventions were performed in 2010 when she had overlapping stents placed at the level of older stents in the mid LAD artery and in the left circumflex coronary artery. She has insulin-requiring type 2 diabetes mellitus and secondary mixed hypertriglyceridemia and hypercholesterolemia. She seems to have had substantial improvement with Ranexa. Her most recent nuclear stress test performed in November 2016 was normal.  2007 mid LAD 2.2512 mini vision, left circumflex 2.518 Cypher, RCA 3.023 Cypher 2010 mid LAD 2.2512 Taxus overlapping 2.012 mini vision, overlapping another 2.012 mini vision, left circumflex 2.516 Taxus upstream of previous stent.  Echo in June 2018 showed normal left ventricular systolic function.  There was mild LVH and relaxation impairment, but without signs of elevated filling pressures. She had a normal nuclear stress test in November 2016. EF was 56%. She had a "false positive" ECG response.    Past Medical History:  Diagnosis Date  . Asthma   . Cancer ([place])    basal cell chest  . Complication of anesthesia   . Coronary artery disease   . Depression   . Diabetes mellitus without complication ([place])    type 2  . Heart murmur   . Hepatitis    auto- immune  . History of kidney stones   . History of nuclear stress test 05/21/2010   dipyridamole;  normal perfusion, preserved LV systolic EF of 16%  . Hyperlipidemia   . Hypertension   . Liver failure ([place])    h/o autoimmune hepatitis   . Myocardial infarction ([place])   . Neuropathy    tingling and burning left leg to foot  . Obesity   . PONV (postoperative nausea and vomiting)   . Psoriasis   . Tubular adenoma of colon    colon path 01/09/2017    Past Surgical History:  Procedure Laterality Date  . Carotid Doppler  10/2008   R & L  ICAs 0-49% diameter reduction   . COLONOSCOPY    . CORONARY ANGIOPLASTY WITH STENT PLACEMENT  09/2008   2.25x37mm Cypher DES to in-stent restenosis of prox LAD; Mini-Vision stent x2 2.0x90mm to area distal of initial LAD stent; 3 2.5x41mm Taxus stents prox to Cypher stent in circumflex  . CORONARY ANGIOPLASTY WITH STENT PLACEMENT  09/2005   Cypher DES 3.0x5mm to distal RCA; 2.25x8mm Mini0Vision stent to mid LAD; 2.5x48mm Cypher stent to circumflex  . LUMBAR LAMINECTOMY/DECOMPRESSION MICRODISCECTOMY Left 10/18/2016   Procedure: Left Lumbar Four-Five Microdiscectomy;  Surgeon: Erline Levine, MD;  Location: [facility];  Service: Neurosurgery;  Laterality: Left;  Left L4-5 Microdiscectomy  . ROTATOR CUFF REPAIR Left   . SHOULDER ARTHROSCOPY W/ ROTATOR CUFF REPAIR Right   . TRANSTHORACIC ECHOCARDIOGRAM  04/2010   EF=>55%, borderline conc LVH; trace MR & TR; AV mildly sclerotic; aortic root sclerosis/calcif  . ureteral stents      Outpatient Medications Prior to Visit  Medication Sig Dispense Refill  . Ascorbic Acid (VITAMIN C PO) Take 1 tablet by mouth daily.    Marland Kitchen aspirin EC 81 MG tablet Take 81 mg by mouth at bedtime.    . Coenzyme Q10 300 MG CAPS Take 300 mg by mouth daily.    . Cranberry (SM CRANBERRY) 300 MG tablet Take 300 mg by mouth every evening.    . ezetimibe (ZETIA) 10 MG tablet Take 10 mg by mouth daily.    . fenofibrate (TRICOR) 145 MG tablet TAKE 1 TABLET BY MOUTH EVERY DAY 90 tablet 3  . glucose blood test strip Pt uses relion meter- test bid for glucose montioring E11.9 Please dispense relion test strips. 100 each 12  . Icosapent Ethyl (VASCEPA) 1 g CAPS Take 2 capsules (2 g total) by mouth 2 (two) times daily. 120 capsule 11  . Insulin Pen Needle (BD PEN NEEDLE NANO U/F) 32G X 4 MM MISC Use as directed for insulin administration. 100 each 3  . isosorbide mononitrate (IMDUR) 30 MG 24 hr tablet Take 1 tablet (30 mg total) by mouth daily. ** DO NOT CRUSH ** 90 tablet 3  . L-Lysine 1000  MG TABS Take 1 tablet by mouth daily.    . metFORMIN (GLUCOPHAGE) 1000 MG tablet Take 1 tablet (1,000 mg total) by mouth 2 (two) times daily with a meal. 180 tablet 3  . Methylsulfonylmethane (MSM PO) Take 6,000 mg by mouth daily.    . metoprolol succinate (TOPROL-XL) 25 MG 24 hr tablet TAKE 1.5 TABLETS (37.5 MG TOTAL) BY MOUTH DAILY. 135 tablet 3  . Multiple Vitamin (MULTIVITAMIN WITH MINERALS) TABS Take 1 tablet by mouth daily.    Marland Kitchen NITROSTAT 0.4 MG SL tablet DISSOLVE 1 TAB UNDER THE TONGUE EVERY 5 MIN AS NEEDED FOR CHEST PAIN (MAX OF 3) 25 tablet 1  . RELION LANCETS THIN 26G MISC 1 Units by Does not apply route 2 (two) times daily. E 11.9 100 each  12  . rosuvastatin (CRESTOR) 20 MG tablet TAKE 1 TABLET BY MOUTH EVERY DAY 90 tablet 3  . Semaglutide,0.25 or 0.5MG /DOS, (OZEMPIC, 0.25 OR 0.5 MG/DOSE,) 2 MG/1.5ML SOPN Inject 0.5 mg into the skin once a week. 2 pen 5  . valsartan (DIOVAN) 160 MG tablet Take 1 tablet (160 mg total) by mouth daily. 90 tablet 3  . Insulin Detemir (LEVEMIR FLEXTOUCH) 100 UNIT/ML Pen INJECT 70 UNITS INTO THE SKIN DAILY. 30 mL 3  . albuterol (PROVENTIL HFA;VENTOLIN HFA) 108 (90 Base) MCG/ACT inhaler Inhale 2 puffs into the lungs every 6 (six) hours as needed for wheezing or shortness of breath. (Patient not taking: Reported on 07/11/2018) 1 Inhaler 2  . fluticasone furoate-vilanterol (BREO ELLIPTA) 200-25 MCG/INH AEPB Inhale 1 puff into the lungs daily. (Patient not taking: Reported on 10/18/2018) 1 each 3   Facility-Administered Medications Prior to Visit  Medication Dose Route Frequency Provider Last Rate Last Dose  . albuterol (PROVENTIL) (2.5 MG/3ML) 0.083% nebulizer solution 2.5 mg  2.5 mg Nebulization Once Rita Ohara, MD         Allergies:   Penicillins   Social History   Socioeconomic History  . Marital status: Single    Spouse name: Not on file  . Number of children: 1  . Years of education: Not on file  . Highest education level: Not on file  Occupational  History    Employer: [employer] Needs  . Financial resource strain: Not on file  . Food insecurity    Worry: Not on file    Inability: Not on file  . Transportation needs    Medical: Not on file    Non-medical: Not on file  Tobacco Use  . Smoking status: Never Smoker  . Smokeless tobacco: Never Used  Substance and Sexual Activity  . Alcohol use: No  . Drug use: No  . Sexual activity: Not Currently  Lifestyle  . Physical activity    Days per week: Not on file    Minutes per session: Not on file  . Stress: Not on file  Relationships  . Social Herbalist on phone: Not on file    Gets together: Not on file    Attends religious service: Not on file    Active member of club or organization: Not on file    Attends meetings of clubs or organizations: Not on file    Relationship status: Not on file  Other Topics Concern  . Not on file  Social History Narrative  . Not on file     ROS:   Please see the history of present illness.    ROS All other systems are reviewed and are negative  PHYSICAL EXAM:   VS:  Pulse 92   Temp 99.2 F (37.3 C) (Temporal)   Ht 5\' 5"  (1.651 m)   Wt 201 lb 9.6 oz (91.4 kg)   SpO2 98%   BMI 33.55 kg/m      General: Alert, oriented x3, no distress, mildly obese Head: no evidence of trauma, PERRL, EOMI, no exophtalmos or lid lag, no myxedema, no xanthelasma; normal ears, nose and oropharynx Neck: normal jugular venous pulsations and no hepatojugular reflux; brisk carotid pulses without delay and no carotid bruits Chest: clear to auscultation, no signs of consolidation by percussion or palpation, normal fremitus, symmetrical and full respiratory excursions Cardiovascular: normal position and quality of the apical impulse, regular rhythm, normal first and second heart sounds, 1-2/6 early peaking  systolic ejection m murmur in the aortic focus, no diastolic urmurs, rubs or gallops Abdomen: no tenderness or  distention, no masses by palpation, no abnormal pulsatility or arterial bruits, normal bowel sounds, no hepatosplenomegaly Extremities: no clubbing, cyanosis or edema; 2+ radial, ulnar and brachial pulses bilaterally; 2+ right femoral, posterior tibial and dorsalis pedis pulses; 2+ left femoral, posterior tibial and dorsalis pedis pulses; no subclavian or femoral bruits Neurological: grossly nonfocal Psych: Normal mood and affect    Wt Readings from Last 3 Encounters:  11/06/18 201 lb 9.6 oz (91.4 kg)  10/18/18 203 lb (92.1 kg)  08/30/18 201 lb 12.8 oz (91.5 kg)      Studies/Labs Reviewed:   EKG:  EKG is not ordered today.  It is a completely normal tracing, normal sinus rhythm.  QTc 447 ms.  Recent Labs: 03/27/2018: ALT 32; BUN 18; Creatinine, Ser 1.05; Hemoglobin 12.4; Platelets 217; Potassium 4.9; Sodium 137   Lipid Panel    Component Value Date/Time   CHOL 143 08/30/2018 0850   TRIG 598 (HH) 08/30/2018 0850   HDL 15 (L) 08/30/2018 0850   CHOLHDL 9.5 (H) 08/30/2018 0850   CHOLHDL 5.1 (H) 07/12/2016 1413   VLDL 50 (H) 07/12/2016 1413   [place] Comment 08/30/2018 0850   LDLDIRECT 93 11/15/2012 0829    ASSESSMENT:    No diagnosis found.   PLAN:  In order of problems listed above:  1. CAD: She is minimally symptomatic, despite a higher level of physical activity and stopping the ranolazine..  Had a low risk nuclear stress test 2016.  Normal left ventricular systolic function.   2. Reactive airway disease: Symptoms are controlled on inhaled steroids and bronchodilators.  Wheezing today because she forgot to take her inhaler.  Recent echo showed no evidence to support either systolic or elevation in mean left atrial pressure due to diastolic heart failure. 3. Ao sclerosis: Her murmur is very prominent but the echo in June 2018 shows no evidence of significant aortic stenosis. 4. HLP: She has a very disadvantageous lipid profile with high cholesterol, very low HDL, severely  elevated triglycerides.  Despite high-dose rosuvastatin, the LDL cholesterol level is not at target, although it is drastically improved compared to baseline). 5. HTN: Fair control 6. DM: Glycemic control remains elusive, but she just started treatment with Ozempic.  Hopefully will see improvement in glucose levels as well as lipid profile in a few months. 7. Obesity: She has a weakness for "crunchy" food by which he means crackers and chips.  Reviewed the importance of trying to stick to foods that are low in carbohydrates and especially avoiding carbohydrates with high glycemic index properties.  Try alternative such as knots, snacks based on peas or other non-starchy foods.    Medication Adjustments/Labs and Tests Ordered: Current medicines are reviewed at length with the patient today.  Concerns regarding medicines are outlined above.  Medication changes, Labs and Tests ordered today are listed in the Patient Instructions below. Patient Instructions  Medication Instructions:  Dr Sallyanne Kuster recommends that you continue on your current medications as directed. Please refer to the Current Medication list given to you today.  If you need a refill on your cardiac medications before your next appointment, please call your pharmacy.   Follow-Up: At [facility], you and your health needs are our priority.  As part of our continuing mission to provide you with exceptional heart care, we have created designated Provider Care Teams.  These Care Teams include your primary Cardiologist (physician)  and Advanced Practice Providers (APPs -  Physician Assistants and Nurse Practitioners) who all work together to provide you with the care you need, when you need it. You will need a follow up appointment in 6 months.  Please call our office 2 months in advance to schedule this appointment.  You may see Sanda Klein, MD or one of the following Advanced Practice Providers on your designated Care Team: [place],  [place] . Fabian Sharp, PA-C . You will receive a reminder letter in the mail two months in advance. If you don't receive a letter, please call our office to schedule the follow-up appointment.      Signed, Sanda Klein, MD  11/08/2018 6:13 PM    [facility] [address] [place], [place], [place]  [address] Phone: [phone]; Fax: [phone]

## 2018-11-06 NOTE — Telephone Encounter (Signed)
Pt left voice mail she needs her levemir rx to be for 90 day supply, her last rx was less thatn 90 days and it still costs her the same $  She states she takes 120 units per day  [facility]

## 2018-11-06 NOTE — Patient Instructions (Addendum)
Medication Instructions:  Dr Sallyanne Kuster recommends that you continue on your current medications as directed. Please refer to the Current Medication list given to you today.  If you need a refill on your cardiac medications before your next appointment, please call your pharmacy.   Follow-Up: At [facility], you and your health needs are our priority.  As part of our continuing mission to provide you with exceptional heart care, we have created designated Provider Care Teams.  These Care Teams include your primary Cardiologist (physician) and Advanced Practice Providers (APPs -  Physician Assistants and Nurse Practitioners) who all work together to provide you with the care you need, when you need it. You will need a follow up appointment in 6 months.  Please call our office 2 months in advance to schedule this appointment.  You may see Sanda Klein, MD or one of the following Advanced Practice Providers on your designated Care Team: [place], [place] . Fabian Sharp, PA-C . You will receive a reminder letter in the mail two months in advance. If you don't receive a letter, please call our office to schedule the follow-up appointment.

## 2018-11-06 NOTE — Telephone Encounter (Signed)
Called pt and advised med was sent in for pt. kH

## 2018-11-07 ENCOUNTER — Other Ambulatory Visit: Payer: Self-pay

## 2018-11-07 DIAGNOSIS — E118 Type 2 diabetes mellitus with unspecified complications: Secondary | ICD-10-CM

## 2018-11-07 MED ORDER — LEVEMIR FLEXTOUCH 100 UNIT/ML ~~LOC~~ SOPN: PEN_INJECTOR | SUBCUTANEOUS | 3 refills | Status: DC

## 2018-11-08 ENCOUNTER — Encounter: Payer: Self-pay | Admitting: Cardiovascular Disease

## 2018-11-10 ENCOUNTER — Emergency Department (HOSPITAL_COMMUNITY): Payer: BC Managed Care – PPO

## 2018-11-10 ENCOUNTER — Inpatient Hospital Stay (HOSPITAL_COMMUNITY)
Admission: EM | Admit: 2018-11-10 | Discharge: 2018-11-14 | DRG: 247 | Disposition: A | Discharge status: Home / Self Care | Payer: BC Managed Care – PPO | Attending: Family Medicine | Admitting: Family Medicine

## 2018-11-10 ENCOUNTER — Encounter (HOSPITAL_COMMUNITY): Payer: Self-pay | Admitting: *Deleted

## 2018-11-10 ENCOUNTER — Other Ambulatory Visit: Payer: Self-pay

## 2018-11-10 DIAGNOSIS — R0789 Other chest pain: Secondary | ICD-10-CM

## 2018-11-10 DIAGNOSIS — I2510 Atherosclerotic heart disease of native coronary artery without angina pectoris: Secondary | ICD-10-CM | POA: Diagnosis present

## 2018-11-10 DIAGNOSIS — I152 Hypertension secondary to endocrine disorders: Secondary | ICD-10-CM

## 2018-11-10 DIAGNOSIS — E1159 Type 2 diabetes mellitus with other circulatory complications: Secondary | ICD-10-CM | POA: Diagnosis present

## 2018-11-10 DIAGNOSIS — E118 Type 2 diabetes mellitus with unspecified complications: Secondary | ICD-10-CM

## 2018-11-10 DIAGNOSIS — Z794 Long term (current) use of insulin: Secondary | ICD-10-CM

## 2018-11-10 DIAGNOSIS — E781 Pure hyperglyceridemia: Secondary | ICD-10-CM | POA: Diagnosis present

## 2018-11-10 DIAGNOSIS — I259 Chronic ischemic heart disease, unspecified: Secondary | ICD-10-CM

## 2018-11-10 DIAGNOSIS — I25118 Atherosclerotic heart disease of native coronary artery with other forms of angina pectoris: Principal | ICD-10-CM

## 2018-11-10 DIAGNOSIS — Z79899 Other long term (current) drug therapy: Secondary | ICD-10-CM

## 2018-11-10 DIAGNOSIS — Z955 Presence of coronary angioplasty implant and graft: Secondary | ICD-10-CM

## 2018-11-10 DIAGNOSIS — Z833 Family history of diabetes mellitus: Secondary | ICD-10-CM

## 2018-11-10 DIAGNOSIS — N189 Chronic kidney disease, unspecified: Secondary | ICD-10-CM | POA: Diagnosis present

## 2018-11-10 DIAGNOSIS — I10 Essential (primary) hypertension: Secondary | ICD-10-CM | POA: Diagnosis present

## 2018-11-10 DIAGNOSIS — N184 Chronic kidney disease, stage 4 (severe): Secondary | ICD-10-CM

## 2018-11-10 DIAGNOSIS — R079 Chest pain, unspecified: Secondary | ICD-10-CM | POA: Diagnosis present

## 2018-11-10 DIAGNOSIS — I252 Old myocardial infarction: Secondary | ICD-10-CM

## 2018-11-10 DIAGNOSIS — Z1159 Encounter for screening for other viral diseases: Secondary | ICD-10-CM

## 2018-11-10 DIAGNOSIS — Z7982 Long term (current) use of aspirin: Secondary | ICD-10-CM

## 2018-11-10 DIAGNOSIS — N179 Acute kidney failure, unspecified: Secondary | ICD-10-CM

## 2018-11-10 DIAGNOSIS — E119 Type 2 diabetes mellitus without complications: Secondary | ICD-10-CM

## 2018-11-10 DIAGNOSIS — L409 Psoriasis, unspecified: Secondary | ICD-10-CM

## 2018-11-10 DIAGNOSIS — E785 Hyperlipidemia, unspecified: Secondary | ICD-10-CM | POA: Diagnosis present

## 2018-11-10 DIAGNOSIS — J45909 Unspecified asthma, uncomplicated: Secondary | ICD-10-CM | POA: Diagnosis present

## 2018-11-10 DIAGNOSIS — E860 Dehydration: Secondary | ICD-10-CM | POA: Diagnosis present

## 2018-11-10 DIAGNOSIS — E1142 Type 2 diabetes mellitus with diabetic polyneuropathy: Secondary | ICD-10-CM | POA: Diagnosis present

## 2018-11-10 LAB — BASIC METABOLIC PANEL
Anion gap: 11 (ref 5–15)
BUN: 40 mg/dL — ABNORMAL HIGH (ref 8–23)
CO2: 21 mmol/L — ABNORMAL LOW (ref 22–32)
Calcium: 10.8 mg/dL — ABNORMAL HIGH (ref 8.9–10.3)
Chloride: 103 mmol/L (ref 98–111)
Creatinine, Ser: 3.14 mg/dL — ABNORMAL HIGH (ref 0.44–1.00)
GFR calc Af Amer: 18 mL/min — ABNORMAL LOW (ref >60)
GFR calc non Af Amer: 15 mL/min — ABNORMAL LOW (ref >60)
Glucose, Bld: 118 mg/dL — ABNORMAL HIGH (ref 70–99)
Potassium: 4.9 mmol/L (ref 3.5–5.1)
Sodium: 135 mmol/L (ref 135–145)

## 2018-11-10 LAB — TROPONIN I (HIGH SENSITIVITY): Troponin I (High Sensitivity): 11 ng/L (ref <18)

## 2018-11-10 LAB — CBC
HCT: 39.7 % (ref 36.0–46.0)
Hemoglobin: 12.5 g/dL (ref 12.0–15.0)
MCH: 29.1 pg (ref 26.0–34.0)
MCHC: 31.5 g/dL (ref 30.0–36.0)
MCV: 92.3 fL (ref 80.0–100.0)
Platelets: 268 10*3/uL (ref 150–400)
RBC: 4.3 MIL/uL (ref 3.87–5.11)
RDW: 13.3 % (ref 11.5–15.5)
WBC: 6 10*3/uL (ref 4.0–10.5)
nRBC: 0 % (ref 0.0–0.2)

## 2018-11-10 MED ORDER — SODIUM CHLORIDE 0.9% FLUSH
3.0000 mL | Freq: Once | INTRAVENOUS | Status: AC
  Administered 2018-11-11: 3 mL via INTRAVENOUS

## 2018-11-10 NOTE — ED Triage Notes (Signed)
Pt reports onset of chest pressure since last night with nausea and sob. Pain is radiating into her left back today, similar to her pain with MI in past. ekg done and no resp distress noted at triage.

## 2018-11-11 ENCOUNTER — Other Ambulatory Visit: Payer: Self-pay

## 2018-11-11 ENCOUNTER — Encounter (HOSPITAL_COMMUNITY): Payer: Self-pay

## 2018-11-11 DIAGNOSIS — E119 Type 2 diabetes mellitus without complications: Secondary | ICD-10-CM | POA: Diagnosis not present

## 2018-11-11 DIAGNOSIS — R079 Chest pain, unspecified: Secondary | ICD-10-CM | POA: Diagnosis present

## 2018-11-11 DIAGNOSIS — N179 Acute kidney failure, unspecified: Secondary | ICD-10-CM | POA: Diagnosis not present

## 2018-11-11 DIAGNOSIS — Z794 Long term (current) use of insulin: Secondary | ICD-10-CM

## 2018-11-11 DIAGNOSIS — I25118 Atherosclerotic heart disease of native coronary artery with other forms of angina pectoris: Secondary | ICD-10-CM | POA: Diagnosis not present

## 2018-11-11 LAB — URINALYSIS, COMPLETE (UACMP) WITH MICROSCOPIC
Bacteria, UA: NONE SEEN
Bilirubin Urine: NEGATIVE
Glucose, UA: NEGATIVE mg/dL
Hgb urine dipstick: NEGATIVE
Ketones, ur: NEGATIVE mg/dL
Leukocytes,Ua: NEGATIVE
Nitrite: NEGATIVE
Protein, ur: NEGATIVE mg/dL
Specific Gravity, Urine: 1.012 (ref 1.005–1.030)
pH: 5 (ref 5.0–8.0)

## 2018-11-11 LAB — BASIC METABOLIC PANEL
Anion gap: 9 (ref 5–15)
BUN: 40 mg/dL — ABNORMAL HIGH (ref 8–23)
CO2: 21 mmol/L — ABNORMAL LOW (ref 22–32)
Calcium: 9.4 mg/dL (ref 8.9–10.3)
Chloride: 107 mmol/L (ref 98–111)
Creatinine, Ser: 2.69 mg/dL — ABNORMAL HIGH (ref 0.44–1.00)
GFR calc Af Amer: 21 mL/min — ABNORMAL LOW (ref >60)
GFR calc non Af Amer: 18 mL/min — ABNORMAL LOW (ref >60)
Glucose, Bld: 201 mg/dL — ABNORMAL HIGH (ref 70–99)
Potassium: 4.5 mmol/L (ref 3.5–5.1)
Sodium: 137 mmol/L (ref 135–145)

## 2018-11-11 LAB — CBC
HCT: 34.4 % — ABNORMAL LOW (ref 36.0–46.0)
Hemoglobin: 10.8 g/dL — ABNORMAL LOW (ref 12.0–15.0)
MCH: 29.3 pg (ref 26.0–34.0)
MCHC: 31.4 g/dL (ref 30.0–36.0)
MCV: 93.5 fL (ref 80.0–100.0)
Platelets: 198 10*3/uL (ref 150–400)
RBC: 3.68 MIL/uL — ABNORMAL LOW (ref 3.87–5.11)
RDW: 13.4 % (ref 11.5–15.5)
WBC: 4.4 10*3/uL (ref 4.0–10.5)
nRBC: 0 % (ref 0.0–0.2)

## 2018-11-11 LAB — GLUCOSE, CAPILLARY
Glucose-Capillary: 199 mg/dL — ABNORMAL HIGH (ref 70–99)
Glucose-Capillary: 145 mg/dL — ABNORMAL HIGH (ref 70–99)
Glucose-Capillary: 128 mg/dL — ABNORMAL HIGH (ref 70–99)
Glucose-Capillary: 171 mg/dL — ABNORMAL HIGH (ref 70–99)

## 2018-11-11 LAB — CBG MONITORING, ED: Glucose-Capillary: 120 mg/dL — ABNORMAL HIGH (ref 70–99)

## 2018-11-11 LAB — TROPONIN I (HIGH SENSITIVITY): Troponin I (High Sensitivity): 11 ng/L (ref <18)

## 2018-11-11 LAB — CREATININE, URINE, RANDOM: Creatinine, Urine: 111.51 mg/dL

## 2018-11-11 LAB — HIV ANTIBODY (ROUTINE TESTING W REFLEX): HIV Screen 4th Generation wRfx: NONREACTIVE

## 2018-11-11 LAB — SARS CORONAVIRUS 2 BY RT PCR (HOSPITAL ORDER, PERFORMED IN ~~LOC~~ HOSPITAL LAB): SARS Coronavirus 2: NEGATIVE

## 2018-11-11 LAB — SODIUM, URINE, RANDOM: Sodium, Ur: 126 mmol/L

## 2018-11-11 LAB — MRSA PCR SCREENING: MRSA by PCR: NEGATIVE

## 2018-11-11 MED ORDER — ONDANSETRON HCL 4 MG/2ML IJ SOLN: 4.0000 mg | Freq: Four times a day (QID) | INTRAMUSCULAR | Status: DC | PRN

## 2018-11-11 MED ORDER — METOPROLOL SUCCINATE ER 25 MG PO TB24
25.0000 mg | ORAL_TABLET | Freq: Every day | ORAL | Status: DC
  Administered 2018-11-11 – 2018-11-14 (×4): 25 mg via ORAL
  Filled 2018-11-11 (×4): qty 1

## 2018-11-11 MED ORDER — NITROGLYCERIN 0.4 MG SL SUBL
0.4000 mg | SUBLINGUAL_TABLET | SUBLINGUAL | Status: AC | PRN
  Administered 2018-11-11 (×3): 0.4 mg via SUBLINGUAL
  Filled 2018-11-11 (×2): qty 1

## 2018-11-11 MED ORDER — ASPIRIN 81 MG PO CHEW
324.0000 mg | CHEWABLE_TABLET | Freq: Once | ORAL | Status: AC
  Administered 2018-11-11: 324 mg via ORAL
  Filled 2018-11-11: qty 4

## 2018-11-11 MED ORDER — ISOSORBIDE MONONITRATE ER 30 MG PO TB24
30.0000 mg | ORAL_TABLET | Freq: Every day | ORAL | Status: DC
  Administered 2018-11-11 – 2018-11-14 (×4): 30 mg via ORAL
  Filled 2018-11-11 (×4): qty 1

## 2018-11-11 MED ORDER — SODIUM CHLORIDE 0.9 % IV SOLN
INTRAVENOUS | Status: DC
  Administered 2018-11-11: 01:00:00 via INTRAVENOUS

## 2018-11-11 MED ORDER — INSULIN ASPART 100 UNIT/ML ~~LOC~~ SOLN
0.0000 [IU] | Freq: Every day | SUBCUTANEOUS | Status: DC
  Administered 2018-11-12: 2 [IU] via SUBCUTANEOUS

## 2018-11-11 MED ORDER — HEPARIN SODIUM (PORCINE) 5000 UNIT/ML IJ SOLN
5000.0000 [IU] | Freq: Three times a day (TID) | INTRAMUSCULAR | Status: DC
  Administered 2018-11-11 – 2018-11-14 (×8): 5000 [IU] via SUBCUTANEOUS
  Filled 2018-11-11 (×8): qty 1

## 2018-11-11 MED ORDER — ASPIRIN EC 81 MG PO TBEC
81.0000 mg | DELAYED_RELEASE_TABLET | Freq: Every day | ORAL | Status: DC
  Administered 2018-11-11 – 2018-11-12 (×2): 81 mg via ORAL
  Filled 2018-11-11 (×2): qty 1

## 2018-11-11 MED ORDER — INSULIN GLARGINE 100 UNIT/ML ~~LOC~~ SOLN
45.0000 [IU] | Freq: Every day | SUBCUTANEOUS | Status: DC
  Administered 2018-11-11 – 2018-11-13 (×3): 45 [IU] via SUBCUTANEOUS
  Filled 2018-11-11 (×4): qty 0.45

## 2018-11-11 MED ORDER — ONDANSETRON HCL 4 MG/2ML IJ SOLN
4.0000 mg | Freq: Four times a day (QID) | INTRAMUSCULAR | Status: DC | PRN
  Administered 2018-11-11 – 2018-11-13 (×2): 4 mg via INTRAVENOUS
  Filled 2018-11-11 (×2): qty 2

## 2018-11-11 MED ORDER — ACETAMINOPHEN 325 MG PO TABS
650.0000 mg | ORAL_TABLET | ORAL | Status: DC | PRN
  Administered 2018-11-11: 650 mg via ORAL
  Filled 2018-11-11: qty 2

## 2018-11-11 MED ORDER — SODIUM CHLORIDE 0.9 % IV SOLN
INTRAVENOUS | Status: AC
  Administered 2018-11-11: 12:00:00 via INTRAVENOUS

## 2018-11-11 MED ORDER — ONDANSETRON HCL 4 MG/2ML IJ SOLN
4.0000 mg | Freq: Once | INTRAMUSCULAR | Status: AC
  Administered 2018-11-11: 4 mg via INTRAVENOUS
  Filled 2018-11-11: qty 2

## 2018-11-11 MED ORDER — ROSUVASTATIN CALCIUM 5 MG PO TABS
10.0000 mg | ORAL_TABLET | Freq: Every evening | ORAL | Status: DC
  Administered 2018-11-11: 10 mg via ORAL
  Filled 2018-11-11: qty 2

## 2018-11-11 MED ORDER — INSULIN ASPART 100 UNIT/ML ~~LOC~~ SOLN
0.0000 [IU] | Freq: Three times a day (TID) | SUBCUTANEOUS | Status: DC
  Administered 2018-11-11 (×2): 2 [IU] via SUBCUTANEOUS
  Administered 2018-11-11 – 2018-11-13 (×4): 3 [IU] via SUBCUTANEOUS

## 2018-11-11 MED ORDER — SODIUM CHLORIDE 0.9 % IV BOLUS (SEPSIS)
1000.0000 mL | Freq: Once | INTRAVENOUS | Status: AC
  Administered 2018-11-11: 1000 mL via INTRAVENOUS

## 2018-11-11 MED ORDER — SODIUM CHLORIDE 0.9 % IV SOLN
INTRAVENOUS | Status: AC
  Administered 2018-11-11: 1000 mL via INTRAVENOUS

## 2018-11-11 MED ORDER — ZOLPIDEM TARTRATE 5 MG PO TABS: 5.0000 mg | ORAL_TABLET | Freq: Every evening | ORAL | Status: DC | PRN

## 2018-11-11 MED ORDER — MORPHINE SULFATE (PF) 2 MG/ML IV SOLN
2.0000 mg | INTRAVENOUS | Status: DC | PRN
  Administered 2018-11-11: 2 mg via INTRAVENOUS
  Filled 2018-11-11 (×2): qty 1

## 2018-11-11 NOTE — H&P (Signed)
History and Physical    Haley George NFA:[id number] DOB: [birth year] DOA: 11/10/2018  PCP: Denita Lung, MD   Patient coming from: Home   Chief Complaint: Chest pain   HPI: Haley George is a 63 y.o. female with medical history significant for coronary artery disease with stents, dependent diabetes mellitus, and osteoarthritis, now presenting to the emergency department for evaluation of chest pain.  Patient reports that she has been very active for the past few days moving heavy items and working outside in the heat, developed chest discomfort and nausea the night of 11/09/2018.  She has continued to experience constant chest discomfort since time of onset, waxing and waning some, associated with nausea but no vomiting, and she feels short of breath when the discomfort becomes more intense.  She continued to work carrying heavy items on 11/10/2018, but did not have much change in her symptoms while doing that, though she reports feeling quite fatigued afterwards.  Her symptoms are worse with certain movements, which is different than her prior experiences with angina, but the character of her chest pressure and the radiation towards her left shoulder blade are similar to before.  She denies any fevers, chills, or cough.  She has not had any leg swelling recently.  Denies calf tenderness.  She denies any recent vomiting or diarrhea, but notes that she has not eaten much of anything in the past 2 days due to her chest discomfort.  ED Course: Upon arrivl to the ED, patient is found to be afebrile, saturating well on room air, and with remaining vitals also normal.  EKG features a sinus rhythm and chest x-ray is negative for acute cardiopulmonary disease.  Chemistry panel is concerning for a creatinine of 3.18, up from 1.05 in December 2019.  CBC is unremarkable.  High-sensitivity troponin is normal.  Patient was treated with 3 and 24 mg of aspirin, nitroglycerin, and 1 L of normal saline in the ED.   Chest discomfort improved some with nitroglycerin, but does persist, and hospitalists consulted for admission.  Review of Systems:  All other systems reviewed and apart from HPI, are negative.  Past Medical History:  Diagnosis Date  . Asthma   . Cancer ([place])    basal cell chest  . Complication of anesthesia   . Coronary artery disease   . Depression   . Diabetes mellitus without complication ([place])    type 2  . Heart murmur   . Hepatitis    auto- immune  . History of kidney stones   . History of nuclear stress test 05/21/2010   dipyridamole; normal perfusion, preserved LV systolic EF of 46%  . Hyperlipidemia   . Hypertension   . Liver failure ([place])    h/o autoimmune hepatitis   . Myocardial infarction ([place])   . Neuropathy    tingling and burning left leg to foot  . Obesity   . PONV (postoperative nausea and vomiting)   . Psoriasis   . Tubular adenoma of colon    colon path 01/09/2017    Past Surgical History:  Procedure Laterality Date  . Carotid Doppler  10/2008   R & L ICAs 0-49% diameter reduction   . COLONOSCOPY    . CORONARY ANGIOPLASTY WITH STENT PLACEMENT  09/2008   2.25x8mm Cypher DES to in-stent restenosis of prox LAD; Mini-Vision stent x2 2.0x53mm to area distal of initial LAD stent; 3 2.5x20mm Taxus stents prox to Cypher stent in circumflex  . CORONARY ANGIOPLASTY WITH STENT  PLACEMENT  09/2005   Cypher DES 3.0x7mm to distal RCA; 2.25x12mm Mini0Vision stent to mid LAD; 2.5x38mm Cypher stent to circumflex  . LUMBAR LAMINECTOMY/DECOMPRESSION MICRODISCECTOMY Left 10/18/2016   Procedure: Left Lumbar Four-Five Microdiscectomy;  Surgeon: Erline Levine, MD;  Location: [facility];  Service: Neurosurgery;  Laterality: Left;  Left L4-5 Microdiscectomy  . ROTATOR CUFF REPAIR Left   . SHOULDER ARTHROSCOPY W/ ROTATOR CUFF REPAIR Right   . TRANSTHORACIC ECHOCARDIOGRAM  04/2010   EF=>55%, borderline conc LVH; trace MR & TR; AV mildly sclerotic; aortic root sclerosis/calcif  .  ureteral stents       reports that she has never smoked. She has never used smokeless tobacco. She reports that she does not drink alcohol or use drugs.  Allergies  Allergen Reactions  . Penicillins Hives     PATIENT HAD A PCN REACTION WITH IMMEDIATE RASH, FACIAL/TONGUE/THROAT SWELLING, SOB, OR LIGHTHEADEDNESS WITH HYPOTENSION:  #  #  #  YES  #  #  #   Has patient had a PCN reaction causing severe rash involving mucus membranes or skin necrosis:Unknown Has patient had a PCN reaction that required hospitalization:No Has patient had a PCN reaction occurring within the last 10 years:No If all of the above answers are "NO", then may proceed with Cephalosporin use.     Family History  Problem Relation Age of Onset  . Diabetes Brother      Prior to Admission medications   Medication Sig Start Date End Date Taking? Authorizing Provider  Ascorbic Acid (VITAMIN C PO) Take 5,000 mg by mouth daily.    Yes [provider]  aspirin EC 81 MG tablet Take 81 mg by mouth at bedtime.   Yes [provider]  Coenzyme Q10 300 MG CAPS Take 300 mg by mouth daily.   Yes [provider]  Cranberry (SM CRANBERRY) 300 MG tablet Take 300 mg by mouth every evening.   Yes [provider]  ezetimibe (ZETIA) 10 MG tablet Take 10 mg by mouth every evening.    Yes [provider]  fenofibrate (TRICOR) 145 MG tablet TAKE 1 TABLET BY MOUTH EVERY DAY Patient taking differently: Take 145 mg by mouth every evening.  06/06/18  Yes Croitoru, Mihai, MD  glucose blood test strip Pt uses relion meter- test bid for glucose montioring E11.9 Please dispense relion test strips. 03/27/18  Yes Denita Lung, MD  Icosapent Ethyl (VASCEPA) 1 g CAPS Take 2 capsules (2 g total) by mouth 2 (two) times daily. 03/28/18  Yes Denita Lung, MD  Insulin Detemir (LEVEMIR FLEXTOUCH) 100 UNIT/ML Pen INJECT 120 UNITS INTO THE SKIN DAILY. Patient taking differently: Inject 70 Units into the skin  every evening.  11/07/18  Yes Denita Lung, MD  Insulin Pen Needle (BD PEN NEEDLE NANO U/F) 32G X 4 MM MISC Use as directed for insulin administration. 10/18/18  Yes [name], MD  isosorbide mononitrate (IMDUR) 30 MG 24 hr tablet Take 1 tablet (30 mg total) by mouth daily. ** DO NOT CRUSH ** 04/30/18  Yes Croitoru, Mihai, MD  L-Lysine 1000 MG TABS Take 1 tablet by mouth daily.   Yes [provider]  metFORMIN (GLUCOPHAGE) 1000 MG tablet Take 1 tablet (1,000 mg total) by mouth 2 (two) times daily with a meal. 03/27/18  Yes Denita Lung, MD  Methylsulfonylmethane (MSM PO) Take 6,000 mg by mouth daily.   Yes [provider]  metoprolol succinate (TOPROL-XL) 25 MG 24 hr tablet TAKE 1.5 TABLETS (  37.5 MG TOTAL) BY MOUTH DAILY. Patient taking differently: Take 25 mg by mouth daily.  06/14/18  Yes Croitoru, Mihai, MD  Multiple Vitamin (MULTIVITAMIN WITH MINERALS) TABS Take 1 tablet by mouth daily.   Yes [provider]  NITROSTAT 0.4 MG SL tablet DISSOLVE 1 TAB UNDER THE TONGUE EVERY 5 MIN AS NEEDED FOR CHEST PAIN (MAX OF 3) Patient taking differently: Place 0.4 mg under the tongue every 5 (five) minutes as needed for chest pain.  04/23/15  Yes Croitoru, Mihai, MD  RELION LANCETS THIN 26G MISC 1 Units by Does not apply route 2 (two) times daily. E 11.9 03/27/18  Yes Denita Lung, MD  rosuvastatin (CRESTOR) 20 MG tablet TAKE 1 TABLET BY MOUTH EVERY DAY Patient taking differently: Take 20 mg by mouth every evening.  05/14/18  Yes Croitoru, Mihai, MD  Semaglutide,0.25 or 0.5MG /DOS, (OZEMPIC, 0.25 OR 0.5 MG/DOSE,) 2 MG/1.5ML SOPN Inject 0.5 mg into the skin once a week. Patient taking differently: Inject 0.5 mg into the skin every Monday.  10/18/18  Yes [name], MD  valsartan (DIOVAN) 160 MG tablet Take 1 tablet (160 mg total) by mouth daily. Patient taking differently: Take 80 mg by mouth daily.  03/27/18  Yes Denita Lung, MD    Physical Exam: Vitals:    11/10/18 2107 11/11/18 0036 11/11/18 0100  BP: 140/75 (!) 142/72 (!) 130/97  Pulse: 94 84 88  Resp: 16 16 18   Temp: 99.2 F (37.3 C) 98.2 F (36.8 C)   TempSrc: Oral Oral   SpO2: 95% 96% 95%    Constitutional: NAD, calm  Eyes: PERTLA, lids and conjunctivae normal ENMT: Mucous membranes are moist. Posterior pharynx clear of any exudate or lesions.   Neck: normal, supple, no masses, no thyromegaly Respiratory:  no wheezing, no crackles. No accessory muscle use.  Cardiovascular: S1 & S2 heard, regular rate and rhythm. No extremity edema.   Abdomen: No distension, non-tender, soft. Bowel sounds active.  Musculoskeletal: no clubbing / cyanosis. No joint deformity upper and lower extremities.    Skin: no significant rashes, lesions, ulcers. Warm, dry, well-perfused. Neurologic: CN 2-12 grossly intact. Sensation intact. Strength 5/5 in all 4 limbs.  Psychiatric: Alert and oriented x 3. Very pleasant, cooperative.    Labs on Admission: I have personally reviewed following labs and imaging studies  CBC: Recent Labs  Lab 11/10/18 2118  WBC 6.0  HGB 12.5  HCT 39.7  MCV 92.3  PLT 350   Basic Metabolic Panel: Recent Labs  Lab 11/10/18 2118  NA 135  K 4.9  CL 103  CO2 21*  GLUCOSE 118*  BUN 40*  CREATININE 3.14*  CALCIUM 10.8*   GFR: Estimated Creatinine Clearance: 21 mL/min (A) (by C-G formula based on SCr of 3.14 mg/dL (H)). Liver Function Tests: No results for input(s): AST, ALT, ALKPHOS, BILITOT, PROT, ALBUMIN in the last 168 hours. No results for input(s): LIPASE, AMYLASE in the last 168 hours. No results for input(s): AMMONIA in the last 168 hours. Coagulation Profile: No results for input(s): INR, PROTIME in the last 168 hours. Cardiac Enzymes: No results for input(s): CKTOTAL, CKMB, CKMBINDEX, TROPONINI in the last 168 hours. BNP (last 3 results) No results for input(s): PROBNP in the last 8760 hours. HbA1C: No results for input(s): HGBA1C in the last 72 hours.  CBG: Recent Labs  Lab 11/11/18 0244  GLUCAP 120*   Lipid Profile: No results for input(s): CHOL, HDL, LDLCALC, TRIG, CHOLHDL, LDLDIRECT in the last 72 hours. Thyroid  Function Tests: No results for input(s): TSH, T4TOTAL, FREET4, T3FREE, THYROIDAB in the last 72 hours. Anemia Panel: No results for input(s): VITAMINB12, FOLATE, FERRITIN, TIBC, IRON, RETICCTPCT in the last 72 hours. Urine analysis:    Component Value Date/Time   COLORURINE RED (A) 01/03/2012 0726   APPEARANCEUR TURBID (A) 01/03/2012 0726   LABSPEC 1.020 04/09/2018 1113   PHURINE 7.0 01/03/2012 0726   GLUCOSEU 500 (A) 01/03/2012 0726   HGBUR LARGE (A) 01/03/2012 0726   BILIRUBINUR negative 04/09/2018 1113   BILIRUBINUR n 08/29/2016 0900   BILIRUBINUR Negative 04/17/2008 1036   KETONESUR negative 04/09/2018 1113   KETONESUR 15 (A) 01/03/2012 0726   PROTEINUR =100 (A) 04/09/2018 1113   PROTEINUR pos 08/29/2016 0900   PROTEINUR >300 (A) 01/03/2012 0726   UROBILINOGEN negative 07/12/2016 1521   UROBILINOGEN >8.0 (H) 01/03/2012 0726   NITRITE Negative 04/09/2018 1113   NITRITE n 08/29/2016 0900   NITRITE POSITIVE (A) 01/03/2012 0726   LEUKOCYTESUR Large (3+) (A) 04/09/2018 1113   LEUKOCYTESUR Negative 04/17/2008 1036   Sepsis Labs: @LABRCNTIP (procalcitonin:4,lacticidven:4) ) Recent Results (from the past 240 hour(s))  SARS Coronavirus 2 (CEPHEID - Performed in [place] hospital lab), Hosp Order     Status: None   Collection Time: 11/11/18  1:53 AM   Specimen: Nasopharyngeal Swab  Result Value Ref Range Status   SARS Coronavirus 2 NEGATIVE NEGATIVE Final    Comment: (NOTE) If result is NEGATIVE SARS-CoV-2 target nucleic acids are NOT DETECTED. The SARS-CoV-2 RNA is generally detectable in upper and lower  respiratory specimens during the acute phase of infection. The lowest  concentration of SARS-CoV-2 viral copies this assay can detect is 250  copies / mL. A negative result does not preclude SARS-CoV-2  infection  and should not be used as the sole basis for treatment or other  patient management decisions.  A negative result may occur with  improper specimen collection / handling, submission of specimen other  than nasopharyngeal swab, presence of viral mutation(s) within the  areas targeted by this assay, and inadequate number of viral copies  (<250 copies / mL). A negative result must be combined with clinical  observations, patient history, and epidemiological information. If result is POSITIVE SARS-CoV-2 target nucleic acids are DETECTED. The SARS-CoV-2 RNA is generally detectable in upper and lower  respiratory specimens dur ing the acute phase of infection.  Positive  results are indicative of active infection with SARS-CoV-2.  Clinical  correlation with patient history and other diagnostic information is  necessary to determine patient infection status.  Positive results do  not rule out bacterial infection or co-infection with other viruses. If result is PRESUMPTIVE POSTIVE SARS-CoV-2 nucleic acids MAY BE PRESENT.   A presumptive positive result was obtained on the submitted specimen  and confirmed on repeat testing.  While 2019 novel coronavirus  (SARS-CoV-2) nucleic acids may be present in the submitted sample  additional confirmatory testing may be necessary for epidemiological  and / or clinical management purposes  to differentiate between  SARS-CoV-2 and other Sarbecovirus currently known to infect humans.  If clinically indicated additional testing with an alternate test  methodology [phone]) is advised. The SARS-CoV-2 RNA is generally  detectable in upper and lower respiratory sp ecimens during the acute  phase of infection. The expected result is Negative. Fact Sheet for Patients:  StrictlyIdeas.no Fact Sheet for Healthcare Providers: BankingDealers.co.za This test is not yet approved or cleared by the [place]  FDA and has been authorized for detection  and/or diagnosis of SARS-CoV-2 by FDA under an Emergency Use Authorization (EUA).  This EUA will remain in effect (meaning this test can be used) for the duration of the COVID-19 declaration under Section 564(b)(1) of the Act, 21 U.S.C. section 360bbb-3(b)(1), unless the authorization is terminated or revoked sooner. Performed at [facility], [place] [address], [place], [place] [address]      Radiological Exams on Admission: Dg Chest 2 View  Result Date: 11/10/2018 CLINICAL DATA:  Chest pain and shortness of breath. EXAM: CHEST - 2 VIEW COMPARISON:  02/27/2017 FINDINGS: The cardiomediastinal contours are normal. Coronary stent visualized. The lungs are clear. Pulmonary vasculature is normal. No consolidation, pleural effusion, or pneumothorax. No acute osseous abnormalities are seen. IMPRESSION: No acute chest findings. Electronically Signed   By: Keith Rake M.D.   On: 11/10/2018 22:03    EKG: Independently reviewed. Sinus rhythm, no acute ST-T abnormalities.   Assessment/Plan   1. Chest pain; CAD  - Pt has CAD with stents and presents with 24 hrs of chest discomfort that is similar to her prior MI but different in that it is worse with certain movements  - She reports improvement with NTG in ED  - EKG with no acute ischemic features, HS troponin is normal x2 >4 hrs apart,  CXR unremarkable  - She was treated with ASA 324 mg in ED  - Continue cardiac monitoring, continue ASA, statin, and beta-blocker, repeat EKG    2. Acute kidney injury  - SCr is 3.18 on admission, up from 1.05 in December 2019  - Likely acute prerenal azotemia given the hx of working outside in heat for the past couple days and not eating much  - She was given a liter NS in ED  - Renally-dose medications, hold ARB, check FENa, continue IVF hydration, repeat chem panel in am    3. Insulin-dependent DM  - A1c was 9.2% in May 2020  - Managed with Lantus, Ozempic,  and metformin at home, held on admission  - Monitor CBG's, continue Lantus, use Novolog correctional     PPE: Mask, face shield  DVT prophylaxis: sq heparin  Code Status: Full  Family Communication: Discussed with patient  Consults called: None Admission status: Observation     Vianne Bulls, MD [facility] Pager [phone]  If 7PM-7AM, please contact night-coverage [email or web address] Password [email or web address]  11/11/2018, 3:13 AM

## 2018-11-11 NOTE — ED Provider Notes (Signed)
TIME SEEN: 12:39 AM  CHIEF COMPLAINT: Chest pain  HPI: Patient is a 63 year old female with history of hypertension, hyperlipidemia, diabetes, CAD status post 7 stents followed by Dr. Sallyanne Kuster who presents to the emergency department today with chest pain that started at 8 PM.  Describes it as a left-sided heaviness that radiates under her armpit.  She feels short of breath, nauseated and "clammy".  States symptoms have improved but not resolved.  They are worse with lying flat and better with sitting upright.  Feels somewhat similar to her anginal equivalent.  Last cardiac catheterization appears to be in 2010.  She has had a stress test in 2016.  Last echocardiogram was in 2018.   Patient's renal function found to be elevated today.  No recent vomiting, diarrhea.  Good oral intake.  Not on chronic NSAIDs.   Echo 09/26/2016:  Study Conclusions  - Left ventricle: The cavity size was normal. Wall thickness was   increased in a pattern of mild LVH. Systolic function was normal.   The estimated ejection fraction was in the range of 60% to 65%.   Wall motion was normal; there were no regional wall motion   abnormalities. Doppler parameters are consistent with abnormal   left ventricular relaxation (grade 1 diastolic dysfunction). - Aortic valve: Mildly calcified annulus.    ROS: See HPI Constitutional: no fever  Eyes: no drainage  ENT: no runny nose   Cardiovascular:  no chest pain  Resp: no SOB  GI: no vomiting GU: no dysuria Integumentary: no rash  Allergy: no hives  Musculoskeletal: no leg swelling  Neurological: no slurred speech ROS otherwise negative  PAST MEDICAL HISTORY/PAST SURGICAL HISTORY:  Past Medical History:  Diagnosis Date  . Asthma   . Cancer ([place])    basal cell chest  . Complication of anesthesia   . Coronary artery disease   . Depression   . Diabetes mellitus without complication ([place])    type 2  . Heart murmur   . Hepatitis    auto- immune  . History  of kidney stones   . History of nuclear stress test 05/21/2010   dipyridamole; normal perfusion, preserved LV systolic EF of 42%  . Hyperlipidemia   . Hypertension   . Liver failure ([place])    h/o autoimmune hepatitis   . Myocardial infarction ([place])   . Neuropathy    tingling and burning left leg to foot  . Obesity   . PONV (postoperative nausea and vomiting)   . Psoriasis   . Tubular adenoma of colon    colon path 01/09/2017    MEDICATIONS:  Prior to Admission medications   Medication Sig Start Date End Date Taking? Authorizing Provider  Ascorbic Acid (VITAMIN C PO) Take 1 tablet by mouth daily.    [provider]  aspirin EC 81 MG tablet Take 81 mg by mouth at bedtime.    [provider]  Coenzyme Q10 300 MG CAPS Take 300 mg by mouth daily.    [provider]  Cranberry (SM CRANBERRY) 300 MG tablet Take 300 mg by mouth every evening.    [provider]  ezetimibe (ZETIA) 10 MG tablet Take 10 mg by mouth daily.    [provider]  fenofibrate (TRICOR) 145 MG tablet TAKE 1 TABLET BY MOUTH EVERY DAY 06/06/18   Croitoru, Mihai, MD  glucose blood test strip Pt uses relion meter- test bid for glucose montioring E11.9 Please dispense relion test strips. 03/27/18   Jill Alexanders  C, MD  Icosapent Ethyl (VASCEPA) 1 g CAPS Take 2 capsules (2 g total) by mouth 2 (two) times daily. 03/28/18   Denita Lung, MD  Insulin Detemir (LEVEMIR FLEXTOUCH) 100 UNIT/ML Pen INJECT 120 UNITS INTO THE SKIN DAILY. 11/07/18   Denita Lung, MD  Insulin Pen Needle (BD PEN NEEDLE NANO U/F) 32G X 4 MM MISC Use as directed for insulin administration. 10/18/18   [name], MD  isosorbide mononitrate (IMDUR) 30 MG 24 hr tablet Take 1 tablet (30 mg total) by mouth daily. ** DO NOT CRUSH ** 04/30/18   Croitoru, Mihai, MD  L-Lysine 1000 MG TABS Take 1 tablet by mouth daily.    [provider]  metFORMIN (GLUCOPHAGE) 1000 MG tablet Take 1 tablet (1,000 mg total) by  mouth 2 (two) times daily with a meal. 03/27/18   Denita Lung, MD  Methylsulfonylmethane (MSM PO) Take 6,000 mg by mouth daily.    [provider]  metoprolol succinate (TOPROL-XL) 25 MG 24 hr tablet TAKE 1.5 TABLETS (37.5 MG TOTAL) BY MOUTH DAILY. 06/14/18   Croitoru, Mihai, MD  Multiple Vitamin (MULTIVITAMIN WITH MINERALS) TABS Take 1 tablet by mouth daily.    [provider]  NITROSTAT 0.4 MG SL tablet DISSOLVE 1 TAB UNDER THE TONGUE EVERY 5 MIN AS NEEDED FOR CHEST PAIN (MAX OF 3) 04/23/15   Croitoru, Mihai, MD  RELION LANCETS THIN 26G MISC 1 Units by Does not apply route 2 (two) times daily. E 11.9 03/27/18   Denita Lung, MD  rosuvastatin (CRESTOR) 20 MG tablet TAKE 1 TABLET BY MOUTH EVERY DAY 05/14/18   Croitoru, Mihai, MD  Semaglutide,0.25 or 0.5MG /DOS, (OZEMPIC, 0.25 OR 0.5 MG/DOSE,) 2 MG/1.5ML SOPN Inject 0.5 mg into the skin once a week. 10/18/18   [name], MD  valsartan (DIOVAN) 160 MG tablet Take 1 tablet (160 mg total) by mouth daily. 03/27/18   Denita Lung, MD    ALLERGIES:  Allergies  Allergen Reactions  . Penicillins Hives     PATIENT HAD A PCN REACTION WITH IMMEDIATE RASH, FACIAL/TONGUE/THROAT SWELLING, SOB, OR LIGHTHEADEDNESS WITH HYPOTENSION:  #  #  #  YES  #  #  #   Has patient had a PCN reaction causing severe rash involving mucus membranes or skin necrosis:Unknown Has patient had a PCN reaction that required hospitalization:No Has patient had a PCN reaction occurring within the last 10 years:No If all of the above answers are "NO", then may proceed with Cephalosporin use.     SOCIAL HISTORY:  Social History   Tobacco Use  . Smoking status: Never Smoker  . Smokeless tobacco: Never Used  Substance Use Topics  . Alcohol use: No    FAMILY HISTORY: Family History  Problem Relation Age of Onset  . Diabetes Brother     EXAM: BP (!) 142/72 (BP Location: Left Arm)   Pulse 84   Temp 98.2 F (36.8 C) (Oral)   Resp 16   SpO2 96%   CONSTITUTIONAL: Alert and oriented and responds appropriately to questions. Well-appearing; well-nourished HEAD: Normocephalic EYES: Conjunctivae clear, pupils appear equal, EOMI ENT: normal nose; moist mucous membranes NECK: Supple, no meningismus, no nuchal rigidity, no LAD  CARD: RRR; S1 and S2 appreciated; no murmurs, no clicks, no rubs, no gallops RESP: Normal chest excursion without splinting or tachypnea; breath sounds clear and equal bilaterally; no wheezes, no rhonchi, no rales, no hypoxia or respiratory distress, speaking full sentences ABD/GI: Normal bowel sounds; non-distended; soft,  non-tender, no rebound, no guarding, no peritoneal signs, no hepatosplenomegaly BACK:  The back appears normal and is non-tender to palpation, there is no CVA tenderness EXT: Normal ROM in all joints; non-tender to palpation; no edema; normal capillary refill; no cyanosis, no calf tenderness or swelling    SKIN: Normal color for age and race; warm; no rash NEURO: Moves all extremities equally PSYCH: The patient's mood and manner are appropriate. Grooming and personal hygiene are appropriate.  MEDICAL DECISION MAKING: Patient here with chest pain.  Has multiple risk factors and has significant history of CAD.  Chest pain does seem somewhat atypical.  Will give nitroglycerin given she is still having some symptoms.  First troponin negative.  EKG shows no ischemic change and chest x-ray is clear.  Labs show creatinine of 3.14 which is elevated from December 2019.  Will give IV fluids.  Unclear etiology of acute renal failure.  I am going to give her a dose of aspirin for cardioprotection as I feel the benefit outweighs the risk of 1 dose of aspirin.  ED PROGRESS: Will discuss with medicine for admission.  I do not think this is a PE or dissection.  No signs of pneumonia or volume overload on imaging.   1:21 AM Discussed patient's case with hospitalist, Dr. Myna Hidalgo.  I have recommended admission and patient  (and family if present) agree with this plan. Admitting physician will place admission orders.   I reviewed all nursing notes, vitals, pertinent previous records, EKGs, lab and urine results, imaging (as available).      EKG Interpretation  Date/Time:  Saturday November 10 2018 21:11:13 EDT Ventricular Rate:  87 PR Interval:  128 QRS Duration: 72 QT Interval:  360 QTC Calculation: 433 R Axis:   63 Text Interpretation:  Normal sinus rhythm Normal ECG No significant change since last tracing Confirmed by Koleton Duchemin, Cyril Mourning [phone]) on 11/11/2018 12:39:18 AM         Zaryia Markel, Delice Bison, DO 11/11/18 0133

## 2018-11-11 NOTE — ED Notes (Signed)
ED TO INPATIENT HANDOFF REPORT  ED Nurse Name and Phone #: Annie Main [phone]  S Name/Age/Gender Haley George 63 y.o. female Room/Bed: 025C/025C  Code Status   Code Status: Prior  Home/SNF/Other Home Patient oriented to: self, place, time and situation Is this baseline? Yes   Triage Complete: Triage complete  Chief Complaint chest pain, nausea ,   Triage Note Pt reports onset of chest pressure since last night with nausea and sob. Pain is radiating into her left back today, similar to her pain with MI in past. ekg done and no resp distress noted at triage.   Allergies Allergies  Allergen Reactions  . Penicillins Hives     PATIENT HAD A PCN REACTION WITH IMMEDIATE RASH, FACIAL/TONGUE/THROAT SWELLING, SOB, OR LIGHTHEADEDNESS WITH HYPOTENSION:  #  #  #  YES  #  #  #   Has patient had a PCN reaction causing severe rash involving mucus membranes or skin necrosis:Unknown Has patient had a PCN reaction that required hospitalization:No Has patient had a PCN reaction occurring within the last 10 years:No If all of the above answers are "NO", then may proceed with Cephalosporin use.     Level of Care/Admitting Diagnosis ED Disposition    ED Disposition Condition [facility] Area: [place] [[id number]]  Level of Care: Progressive [102]  I expect the patient will be discharged within 24 hours: Yes  LOW acuity---Tx typically complete <24 hrs---ACUTE conditions typically can be evaluated <24 hours---LABS likely to return to acceptable levels <24 hours---IS near functional baseline---EXPECTED to return to current living arrangement---NOT newly hypoxic: Does not meet criteria for 5C-Observation unit  Covid Evaluation: Asymptomatic Screening Protocol (No Symptoms)  Diagnosis: Chest pain [382505]  Admitting Physician: Vianne Bulls [[id number]]  Attending Physician: Vianne Bulls [[id number]]  PT Class (Do Not Modify): Observation [104]  PT Acc Code (Do Not  Modify): Observation [10022]       B Medical/Surgery History Past Medical History:  Diagnosis Date  . Asthma   . Cancer ([place])    basal cell chest  . Complication of anesthesia   . Coronary artery disease   . Depression   . Diabetes mellitus without complication ([place])    type 2  . Heart murmur   . Hepatitis    auto- immune  . History of kidney stones   . History of nuclear stress test 05/21/2010   dipyridamole; normal perfusion, preserved LV systolic EF of 40%  . Hyperlipidemia   . Hypertension   . Liver failure ([place])    h/o autoimmune hepatitis   . Myocardial infarction ([place])   . Neuropathy    tingling and burning left leg to foot  . Obesity   . PONV (postoperative nausea and vomiting)   . Psoriasis   . Tubular adenoma of colon    colon path 01/09/2017   Past Surgical History:  Procedure Laterality Date  . Carotid Doppler  10/2008   R & L ICAs 0-49% diameter reduction   . COLONOSCOPY    . CORONARY ANGIOPLASTY WITH STENT PLACEMENT  09/2008   2.25x62mm Cypher DES to in-stent restenosis of prox LAD; Mini-Vision stent x2 2.0x59mm to area distal of initial LAD stent; 3 2.5x76mm Taxus stents prox to Cypher stent in circumflex  . CORONARY ANGIOPLASTY WITH STENT PLACEMENT  09/2005   Cypher DES 3.0x58mm to distal RCA; 2.25x15mm Mini0Vision stent to mid LAD; 2.5x66mm Cypher stent to circumflex  . LUMBAR LAMINECTOMY/DECOMPRESSION MICRODISCECTOMY Left 10/18/2016  Procedure: Left Lumbar Four-Five Microdiscectomy;  Surgeon: Erline Levine, MD;  Location: [facility];  Service: Neurosurgery;  Laterality: Left;  Left L4-5 Microdiscectomy  . ROTATOR CUFF REPAIR Left   . SHOULDER ARTHROSCOPY W/ ROTATOR CUFF REPAIR Right   . TRANSTHORACIC ECHOCARDIOGRAM  04/2010   EF=>55%, borderline conc LVH; trace MR & TR; AV mildly sclerotic; aortic root sclerosis/calcif  . ureteral stents       A IV Location/Drains/Wounds Patient Lines/Drains/Airways Status   Active Line/Drains/Airways    Name:    Placement date:   Placement time:   Site:   Days:   Peripheral IV 11/11/18 Left Antecubital   11/11/18    0107    Antecubital   less than 1   Incision (Closed) 10/18/16 Back Other (Comment)   10/18/16    1334     754          Intake/Output Last 24 hours No intake or output data in the 24 hours ending 11/11/18 0216  Labs/Imaging Results for orders placed or performed during the hospital encounter of 11/10/18 (from the past 48 hour(s))  Basic metabolic panel     Status: Abnormal   Collection Time: 11/10/18  9:18 PM  Result Value Ref Range   Sodium 135 135 - 145 mmol/L   Potassium 4.9 3.5 - 5.1 mmol/L   Chloride 103 98 - 111 mmol/L   CO2 21 (L) 22 - 32 mmol/L   Glucose, Bld 118 (H) 70 - 99 mg/dL   BUN 40 (H) 8 - 23 mg/dL   Creatinine, Ser 3.14 (H) 0.44 - 1.00 mg/dL   Calcium 10.8 (H) 8.9 - 10.3 mg/dL   GFR calc non Af Amer 15 (L) >60 mL/min   GFR calc Af Amer 18 (L) >60 mL/min   Anion gap 11 5 - 15    Comment: Performed at [facility], [address], [place], [place] [address]  CBC     Status: None   Collection Time: 11/10/18  9:18 PM  Result Value Ref Range   WBC 6.0 4.0 - 10.5 K/uL   RBC 4.30 3.87 - 5.11 MIL/uL   Hemoglobin 12.5 12.0 - 15.0 g/dL   HCT 39.7 36.0 - 46.0 %   MCV 92.3 80.0 - 100.0 fL   MCH 29.1 26.0 - 34.0 pg   MCHC 31.5 30.0 - 36.0 g/dL   RDW 13.3 11.5 - 15.5 %   Platelets 268 150 - 400 K/uL   nRBC 0.0 0.0 - 0.2 %    Comment: Performed at [facility], [place] [address], [place], [place] [address]  Troponin I (High Sensitivity)     Status: None   Collection Time: 11/10/18  9:18 PM  Result Value Ref Range   Troponin I (High Sensitivity) 11 <18 ng/L    Comment: (NOTE) Elevated high sensitivity troponin I (hsTnI) values and significant  changes across serial measurements may suggest ACS but many other  chronic and acute conditions are known to elevate hsTnI results.  Refer to the "Links" section for chest pain algorithms and additional   guidance. Performed at [facility], [place] [address], [place], [place] [address]    Dg Chest 2 View  Result Date: 11/10/2018 CLINICAL DATA:  Chest pain and shortness of breath. EXAM: CHEST - 2 VIEW COMPARISON:  02/27/2017 FINDINGS: The cardiomediastinal contours are normal. Coronary stent visualized. The lungs are clear. Pulmonary vasculature is normal. No consolidation, pleural effusion, or pneumothorax. No acute osseous abnormalities are  seen. IMPRESSION: No acute chest findings. Electronically Signed   By: Keith Rake M.D.   On: 11/10/2018 22:03    Pending Labs Unresulted Labs (From admission, onward)    Start     Ordered   11/11/18 0123  SARS Coronavirus 2 (CEPHEID - Performed in [facility] hospital lab), Hosp Order  (Asymptomatic Patients Labs)  Once,   STAT    Question:  Rule Out  Answer:  Yes   11/11/18 0122          Vitals/Pain Today's Vitals   11/10/18 2107 11/10/18 2114 11/11/18 0035 11/11/18 0036  BP: 140/75   (!) 142/72  Pulse: 94   84  Resp: 16   16  Temp: 99.2 F (37.3 C)   98.2 F (36.8 C)  TempSrc: Oral   Oral  SpO2: 95%   96%  PainSc:  8  4      Isolation Precautions No active isolations  Medications Medications  sodium chloride flush (NS) 0.9 % injection 3 mL (has no administration in time range)  nitroGLYCERIN (NITROSTAT) SL tablet 0.4 mg (0.4 mg Sublingual Given 11/11/18 0122)  0.9 %  sodium chloride infusion ( Intravenous New Bag/Given 11/11/18 0123)  aspirin chewable tablet 324 mg (324 mg Oral Given 11/11/18 0121)  sodium chloride 0.9 % bolus 1,000 mL (0 mLs Intravenous Stopped 11/11/18 0213)  ondansetron (ZOFRAN) injection 4 mg (4 mg Intravenous Given 11/11/18 0125)    Mobility walks Low fall risk   Focused Assessments Cardiac Assessment Handoff:  Cardiac Rhythm: Normal sinus rhythm Lab Results  Component Value Date   CKTOTAL 192 (H) 09/21/2010   CKMB 2.9 09/21/2010   TROPONINI  09/21/2010    <0.30        Due to the release  kinetics of cTnI, a negative result within the first hours of the onset of symptoms does not rule out myocardial infarction with certainty. If myocardial infarction is still suspected, repeat the test at appropriate intervals. **Please note change in reference range.**   No results found for: DDIMER Does the Patient currently have chest pain? Yes     R Recommendations: See Admitting Provider Note  Report given to:   Additional Notes:

## 2018-11-11 NOTE — Plan of Care (Signed)

## 2018-11-11 NOTE — Consult Note (Signed)
Cardiology Consultation:   Patient ID: Haley George MRN: [id number]; DOB: 11-22-1955  Admit date: 11/10/2018 Date of Consult: 11/11/2018  Primary Care Provider: Denita Lung, MD Primary Cardiologist: Haley Klein, MD  Primary Electrophysiologist:  None    Patient Profile:   Haley George is a 63 y.o. female with a hx of CAD with extensive history of PCIs who is being seen today for the evaluation of chest pain at the request of Dr . Haley George.  History of Present Illness:   Haley George 63 yo female history of CAD with multiple prior PCIs presents with chest pain . ER found to have marked AKI with Cr up to 3 (baseline is 1), admitted to medicine for management. Cardiology contacted about recent chest pain symptoms   Reporst a heaviness across her entire chest starting Friday night with some associated nausea. Constant feeling, lasted all night and into Saturday. Separate left shoulder blade pain that would come and go, she reports the shoulder blade pain similar to prior angina. Heaviness did improve with NG in ER.    BUN 40 Cr 3.14  hstrop 11-->11 COVID neg  CXR no acute findings EKG SR, no ischemic changes 09/2016 echo LVEF 60-65%, no WMAs, grade I diastolic dysfunction 8676 nuclear stress: no ischemia.      Heart Pathway Score:     Past Medical History:  Diagnosis Date  . Asthma   . Cancer ([place])    basal cell chest  . Complication of anesthesia   . Coronary artery disease   . Depression   . Diabetes mellitus without complication ([place])    type 2  . Heart murmur   . Hepatitis    auto- immune  . History of kidney stones   . History of nuclear stress test 05/21/2010   dipyridamole; normal perfusion, preserved LV systolic EF of 19%  . Hyperlipidemia   . Hypertension   . Liver failure ([place])    h/o autoimmune hepatitis   . Myocardial infarction ([place])   . Neuropathy    tingling and burning left leg to foot  . Obesity   . PONV (postoperative nausea and vomiting)    . Psoriasis   . Tubular adenoma of colon    colon path 01/09/2017    Past Surgical History:  Procedure Laterality Date  . Carotid Doppler  10/2008   R & L ICAs 0-49% diameter reduction   . COLONOSCOPY    . CORONARY ANGIOPLASTY WITH STENT PLACEMENT  09/2008   2.25x63mm Cypher DES to in-stent restenosis of prox LAD; Mini-Vision stent x2 2.0x46mm to area distal of initial LAD stent; 3 2.5x55mm Taxus stents prox to Cypher stent in circumflex  . CORONARY ANGIOPLASTY WITH STENT PLACEMENT  09/2005   Cypher DES 3.0x60mm to distal RCA; 2.25x71mm Mini0Vision stent to mid LAD; 2.5x28mm Cypher stent to circumflex  . LUMBAR LAMINECTOMY/DECOMPRESSION MICRODISCECTOMY Left 10/18/2016   Procedure: Left Lumbar Four-Five Microdiscectomy;  Surgeon: Erline Levine, MD;  Location: [facility] [place];  Service: Neurosurgery;  Laterality: Left;  Left L4-5 Microdiscectomy  . ROTATOR CUFF REPAIR Left   . SHOULDER ARTHROSCOPY W/ ROTATOR CUFF REPAIR Right   . TRANSTHORACIC ECHOCARDIOGRAM  04/2010   EF=>55%, borderline conc LVH; trace MR & TR; AV mildly sclerotic; aortic root sclerosis/calcif  . ureteral stents      Inpatient Medications: Scheduled Meds: . aspirin EC  81 mg Oral QHS  . heparin  5,000 Units Subcutaneous Q8H  . insulin aspart  0-15 Units Subcutaneous TID WC  .  insulin aspart  0-5 Units Subcutaneous QHS  . insulin glargine  45 Units Subcutaneous QHS  . isosorbide mononitrate  30 mg Oral Daily  . metoprolol succinate  25 mg Oral Daily  . rosuvastatin  10 mg Oral QPM   Continuous Infusions: . sodium chloride 1,000 mL (11/11/18 0426)   PRN Meds: acetaminophen, morphine injection, nitroGLYCERIN, ondansetron (ZOFRAN) IV, zolpidem  Allergies:    Allergies  Allergen Reactions  . Penicillins Hives     PATIENT HAD A PCN REACTION WITH IMMEDIATE RASH, FACIAL/TONGUE/THROAT SWELLING, SOB, OR LIGHTHEADEDNESS WITH HYPOTENSION:  #  #  #  YES  #  #  #   Has patient had a PCN reaction causing severe rash involving mucus  membranes or skin necrosis:Unknown Has patient had a PCN reaction that required hospitalization:No Has patient had a PCN reaction occurring within the last 10 years:No If all of the above answers are "NO", then may proceed with Cephalosporin use.     Social History:   Social History   Socioeconomic History  . Marital status: Married    Spouse name: Not on file  . Number of children: 1  . Years of education: Not on file  . Highest education level: Not on file  Occupational History    Employer: [employer]  . Financial resource strain: Not very hard  . Food insecurity    Worry: Never true    Inability: Never true  . Transportation needs    Medical: No    Non-medical: No  Tobacco Use  . Smoking status: Never Smoker  . Smokeless tobacco: Never Used  Substance and Sexual Activity  . Alcohol use: No  . Drug use: No  . Sexual activity: Not Currently  Lifestyle  . Physical activity    Days per week: 2 days    Minutes per session: Not on file  . Stress: Only a little  Relationships  . Social Herbalist on phone: Three times a week    Gets together: Not on file    Attends religious service: Not on file    Active member of club or organization: Yes    Attends meetings of clubs or organizations: Not on file    Relationship status: Married  . Intimate partner violence    Fear of current or ex partner: Not on file    Emotionally abused: Not on file    Physically abused: Not on file    Forced sexual activity: Not on file  Other Topics Concern  . Not on file  Social History Narrative  . Not on file    Family History:    Family History  Problem Relation Age of Onset  . Diabetes Brother      ROS:  Please see the history of present illness.  All other ROS reviewed and negative.     Physical Exam/Data:   Vitals:   11/11/18 0430 11/11/18 0500 11/11/18 0600 11/11/18 0732  BP: (!) 149/70 (!) 124/57 (!) 125/58 139/60  Pulse:     78  Resp: 18 13 13 16   Temp:    98 F (36.7 C)  TempSrc:    Oral  SpO2:    97%  Weight:      Height:        Intake/Output Summary (Last 24 hours) at 11/11/2018 0935 Last data filed at 11/11/2018 0732 Gross per 24 hour  Intake 863.13 ml  Output 850 ml  Net 13.13 ml  Last 3 Weights 11/11/2018 11/06/2018 10/18/2018  Weight (lbs) 198 lb 6.6 oz 201 lb 9.6 oz 203 lb  Weight (kg) 90 kg 91.445 kg 92.08 kg     Body mass index is 33.02 kg/m.  General:  Well nourished, well developed, in no acute distress HEENT: normal Lymph: no adenopathy Neck: no JVD Endocrine:  No thryomegaly Vascular: No carotid bruits; FA pulses 2+ bilaterally without bruits  Cardiac:  normal S1, S2; 2/6 systolic murmur rusb Lungs:  clear to auscultation bilaterally, no wheezing, rhonchi or rales  Abd: soft, nontender, no hepatomegaly  Ext: no edema Musculoskeletal:  No deformities, BUE and BLE strength normal and equal Skin: warm and dry  Neuro:  CNs 2-12 intact, no focal abnormalities noted Psych:  Normal affect     Laboratory Data:  High Sensitivity Troponin:   Recent Labs  Lab 11/10/18 2118 11/11/18 0151  TROPONINIHS 11 11     Cardiac EnzymesNo results for input(s): TROPONINI in the last 168 hours. No results for input(s): TROPIPOC in the last 168 hours.  Chemistry Recent Labs  Lab 11/10/18 2118 11/11/18 0541  NA 135 137  K 4.9 4.5  CL 103 107  CO2 21* 21*  GLUCOSE 118* 201*  BUN 40* 40*  CREATININE 3.14* 2.69*  CALCIUM 10.8* 9.4  GFRNONAA 15* 18*  GFRAA 18* 21*  ANIONGAP 11 9    No results for input(s): PROT, ALBUMIN, AST, ALT, ALKPHOS, BILITOT in the last 168 hours. Hematology Recent Labs  Lab 11/10/18 2118 11/11/18 0541  WBC 6.0 4.4  RBC 4.30 3.68*  HGB 12.5 10.8*  HCT 39.7 34.4*  MCV 92.3 93.5  MCH 29.1 29.3  MCHC 31.5 31.4  RDW 13.3 13.4  PLT 268 198   BNPNo results for input(s): BNP, PROBNP in the last 168 hours.  DDimer No results for input(s): DDIMER in the last  168 hours.   Radiology/Studies:  Dg Chest 2 View  Result Date: 11/10/2018 CLINICAL DATA:  Chest pain and shortness of breath. EXAM: CHEST - 2 VIEW COMPARISON:  02/27/2017 FINDINGS: The cardiomediastinal contours are normal. Coronary stent visualized. The lungs are clear. Pulmonary vasculature is normal. No consolidation, pleural effusion, or pneumothorax. No acute osseous abnormalities are seen. IMPRESSION: No acute chest findings. Electronically Signed   By: Keith Rake M.D.   On: 11/10/2018 22:03    Assessment and Plan:   1. CAD/chest pain -extensive CAD history with multiple prior interventions - fairly atypical chest pain, primarily in its duration and some positional component. She reports the left shoudler blade pain is similar to prior angina, and there was some improvement in symptoms with NG. SHe is quite concerned given her prior history. No objective evidence of ischemia by EKG or enzymes.  - last ischemic evaluation in 2016.   - we will plan for lexiscan tomorrow AM to further evaluate   2. AKI - likely prerenal, startin to improve with IVFs - management per primary team    For questions or updates, please contact [place] Please consult [email or web address] for contact info under     Signed, Carlyle Dolly, MD  11/11/2018 9:35 AM

## 2018-11-11 NOTE — Progress Notes (Signed)
Patient ID: Haley George, female   DOB: 1955-11-07, 63 y.o.   MRN: [id number] Patient was admitted early this morning for chest pain and acute kidney injury and started on IV fluids.  Patient seen and examined at bedside.  She still has intermittent nausea and some chest pressure.  I have reviewed her medical records including this morning's H&P myself.  I have also reviewed her current medications and labs.  Her creatinine is improving.  Continue IV fluids.  She would like Dr. Sallyanne Kuster to be notified.  I will notify cardiology.  Repeat a.m. labs.

## 2018-11-12 ENCOUNTER — Observation Stay (HOSPITAL_COMMUNITY): Payer: BC Managed Care – PPO

## 2018-11-12 DIAGNOSIS — I208 Other forms of angina pectoris: Secondary | ICD-10-CM

## 2018-11-12 DIAGNOSIS — E860 Dehydration: Secondary | ICD-10-CM | POA: Diagnosis present

## 2018-11-12 DIAGNOSIS — E781 Pure hyperglyceridemia: Secondary | ICD-10-CM | POA: Diagnosis present

## 2018-11-12 DIAGNOSIS — Z79899 Other long term (current) drug therapy: Secondary | ICD-10-CM | POA: Diagnosis not present

## 2018-11-12 DIAGNOSIS — J45909 Unspecified asthma, uncomplicated: Secondary | ICD-10-CM | POA: Diagnosis present

## 2018-11-12 DIAGNOSIS — I259 Chronic ischemic heart disease, unspecified: Secondary | ICD-10-CM | POA: Diagnosis not present

## 2018-11-12 DIAGNOSIS — Z794 Long term (current) use of insulin: Secondary | ICD-10-CM | POA: Diagnosis not present

## 2018-11-12 DIAGNOSIS — E1142 Type 2 diabetes mellitus with diabetic polyneuropathy: Secondary | ICD-10-CM | POA: Diagnosis present

## 2018-11-12 DIAGNOSIS — Z1159 Encounter for screening for other viral diseases: Secondary | ICD-10-CM | POA: Diagnosis not present

## 2018-11-12 DIAGNOSIS — R079 Chest pain, unspecified: Secondary | ICD-10-CM

## 2018-11-12 DIAGNOSIS — N179 Acute kidney failure, unspecified: Secondary | ICD-10-CM | POA: Diagnosis present

## 2018-11-12 DIAGNOSIS — Z833 Family history of diabetes mellitus: Secondary | ICD-10-CM | POA: Diagnosis not present

## 2018-11-12 DIAGNOSIS — I10 Essential (primary) hypertension: Secondary | ICD-10-CM

## 2018-11-12 DIAGNOSIS — E785 Hyperlipidemia, unspecified: Secondary | ICD-10-CM | POA: Diagnosis present

## 2018-11-12 DIAGNOSIS — I25118 Atherosclerotic heart disease of native coronary artery with other forms of angina pectoris: Secondary | ICD-10-CM | POA: Diagnosis present

## 2018-11-12 DIAGNOSIS — Z955 Presence of coronary angioplasty implant and graft: Secondary | ICD-10-CM | POA: Diagnosis not present

## 2018-11-12 DIAGNOSIS — R0789 Other chest pain: Secondary | ICD-10-CM | POA: Diagnosis present

## 2018-11-12 DIAGNOSIS — E1159 Type 2 diabetes mellitus with other circulatory complications: Secondary | ICD-10-CM

## 2018-11-12 DIAGNOSIS — I252 Old myocardial infarction: Secondary | ICD-10-CM | POA: Diagnosis not present

## 2018-11-12 DIAGNOSIS — Z7982 Long term (current) use of aspirin: Secondary | ICD-10-CM | POA: Diagnosis not present

## 2018-11-12 DIAGNOSIS — I2510 Atherosclerotic heart disease of native coronary artery without angina pectoris: Secondary | ICD-10-CM | POA: Diagnosis not present

## 2018-11-12 LAB — CBC WITH DIFFERENTIAL/PLATELET
Abs Immature Granulocytes: 0.01 10*3/uL (ref 0.00–0.07)
Basophils Absolute: 0 10*3/uL (ref 0.0–0.1)
Basophils Relative: 1 %
Eosinophils Absolute: 0.3 10*3/uL (ref 0.0–0.5)
Eosinophils Relative: 8 %
HCT: 34.1 % — ABNORMAL LOW (ref 36.0–46.0)
Hemoglobin: 10.8 g/dL — ABNORMAL LOW (ref 12.0–15.0)
Immature Granulocytes: 0 %
Lymphocytes Relative: 33 %
Lymphs Abs: 1.2 10*3/uL (ref 0.7–4.0)
MCH: 29.8 pg (ref 26.0–34.0)
MCHC: 31.7 g/dL (ref 30.0–36.0)
MCV: 94.2 fL (ref 80.0–100.0)
Monocytes Absolute: 0.3 10*3/uL (ref 0.1–1.0)
Monocytes Relative: 9 %
Neutro Abs: 1.7 10*3/uL (ref 1.7–7.7)
Neutrophils Relative %: 49 %
Platelets: 189 10*3/uL (ref 150–400)
RBC: 3.62 MIL/uL — ABNORMAL LOW (ref 3.87–5.11)
RDW: 13.2 % (ref 11.5–15.5)
WBC: 3.6 10*3/uL — ABNORMAL LOW (ref 4.0–10.5)
nRBC: 0 % (ref 0.0–0.2)

## 2018-11-12 LAB — GLUCOSE, CAPILLARY
Glucose-Capillary: 166 mg/dL — ABNORMAL HIGH (ref 70–99)
Glucose-Capillary: 196 mg/dL — ABNORMAL HIGH (ref 70–99)
Glucose-Capillary: 152 mg/dL — ABNORMAL HIGH (ref 70–99)
Glucose-Capillary: 208 mg/dL — ABNORMAL HIGH (ref 70–99)

## 2018-11-12 LAB — NM MYOCAR MULTI W/SPECT W/WALL MOTION / EF
Estimated workload: 1 METS
Exercise duration (min): 5 min
Exercise duration (sec): 2 s
Peak HR: 111 {beats}/min
Rest HR: 75 {beats}/min

## 2018-11-12 LAB — COMPREHENSIVE METABOLIC PANEL
ALT: 29 U/L (ref 0–44)
AST: 37 U/L (ref 15–41)
Albumin: 3.2 g/dL — ABNORMAL LOW (ref 3.5–5.0)
Alkaline Phosphatase: 27 U/L — ABNORMAL LOW (ref 38–126)
Anion gap: 7 (ref 5–15)
BUN: 34 mg/dL — ABNORMAL HIGH (ref 8–23)
CO2: 20 mmol/L — ABNORMAL LOW (ref 22–32)
Calcium: 8.3 mg/dL — ABNORMAL LOW (ref 8.9–10.3)
Chloride: 107 mmol/L (ref 98–111)
Creatinine, Ser: 1.63 mg/dL — ABNORMAL HIGH (ref 0.44–1.00)
GFR calc Af Amer: 39 mL/min — ABNORMAL LOW (ref >60)
GFR calc non Af Amer: 34 mL/min — ABNORMAL LOW (ref >60)
Glucose, Bld: 215 mg/dL — ABNORMAL HIGH (ref 70–99)
Potassium: 4.7 mmol/L (ref 3.5–5.1)
Sodium: 134 mmol/L — ABNORMAL LOW (ref 135–145)
Total Bilirubin: 0.7 mg/dL (ref 0.3–1.2)
Total Protein: 6.2 g/dL — ABNORMAL LOW (ref 6.5–8.1)

## 2018-11-12 LAB — MAGNESIUM: Magnesium: 1.7 mg/dL (ref 1.7–2.4)

## 2018-11-12 MED ORDER — TECHNETIUM TC 99M TETROFOSMIN IV KIT
30.0000 | PACK | Freq: Once | INTRAVENOUS | Status: AC | PRN
  Administered 2018-11-12: 30 via INTRAVENOUS

## 2018-11-12 MED ORDER — REGADENOSON 0.4 MG/5ML IV SOLN
0.4000 mg | Freq: Once | INTRAVENOUS | Status: AC
  Administered 2018-11-12: 0.4 mg via INTRAVENOUS
  Filled 2018-11-12: qty 5

## 2018-11-12 MED ORDER — REGADENOSON 0.4 MG/5ML IV SOLN
INTRAVENOUS | Status: AC
  Filled 2018-11-12: qty 5

## 2018-11-12 MED ORDER — ROSUVASTATIN CALCIUM 20 MG PO TABS
40.0000 mg | ORAL_TABLET | Freq: Every evening | ORAL | Status: DC
  Administered 2018-11-12: 40 mg via ORAL
  Filled 2018-11-12: qty 2

## 2018-11-12 MED ORDER — SEMAGLUTIDE(0.25 OR 0.5MG/DOS) 2 MG/1.5ML ~~LOC~~ SOPN: 0.5000 mg | PEN_INJECTOR | SUBCUTANEOUS | Status: DC

## 2018-11-12 MED ORDER — SODIUM CHLORIDE 0.9 % IV SOLN
INTRAVENOUS | Status: DC
  Administered 2018-11-12 – 2018-11-13 (×2): via INTRAVENOUS

## 2018-11-12 MED ORDER — FENOFIBRATE 160 MG PO TABS
160.0000 mg | ORAL_TABLET | Freq: Every day | ORAL | Status: DC
  Administered 2018-11-12 – 2018-11-14 (×3): 160 mg via ORAL
  Filled 2018-11-12 (×3): qty 1

## 2018-11-12 MED ORDER — TECHNETIUM TC 99M TETROFOSMIN IV KIT
10.0000 | PACK | Freq: Once | INTRAVENOUS | Status: AC | PRN
  Administered 2018-11-12: 10 via INTRAVENOUS

## 2018-11-12 NOTE — Progress Notes (Signed)
Patient presented for Lexiscan. Tolerated procedure well. Pending final stress imaging result. [facility] to read.  

## 2018-11-12 NOTE — Progress Notes (Signed)
Myovue done today      IMpression:   IMPRESSION: 1. Moderate-sized reversible defect in the anteroseptal wall compatible with ischemia.  2. Normal left ventricular wall motion.  3. Left ventricular ejection fraction 52%   Given above would recomm L heart cath to define anatomy   LImit contrast    Pt understands and agrees to proceed.  Dorris Carnes MD

## 2018-11-12 NOTE — Progress Notes (Signed)
Patient ID: Haley George, female   DOB: January 26, 1956, 63 y.o.   MRN: [id number]  PROGRESS NOTE    Haley George  EZM:[id number] DOB: [birth year] DOA: 11/10/2018 PCP: Denita Lung, MD   Brief Narrative:  63 year old female with history of coronary artery disease with stents, diabetes mellitus and osteoarthritis presented with chest pain.  Initial high-sensitivity troponin was normal.  She was found to have creatinine of 3.18, up from 1.05 in December 2019.  She was treated with IV fluids.  Cardiology was consulted.  Assessment & Plan:   Chest pain in a patient with coronary disease and stents -Currently still has some mild intermittent chest pressure.  Cardiology following.  For stress test today. -Continue aspirin, metoprolol, statin  Acute kidney injury -Presented with creatinine of 3.18; up from 1.05 in December 2019.  Probably secondary to prerenal azotemia from dehydration and working outside in the heat -Treated with IV fluids.  Creatinine improving to 1.63 today. -Monitor.  Diabetes mellitus type 2 -A1c 9.2 in May 2 and 20 -Continue Lantus.  Continue CBGs with SSI  Hypertension -Continue metoprolol, isosorbide mononitrate  Hyperlipidemia/hypertriglyceridemia -Continue higher doses of Crestor.  Fenofibrate has been started as well.  DVT prophylaxis: Heparin Code Status: Full Family Communication: Spoke to patient at bedside Disposition Plan: Home in 1 to 2 days once cleared by cardiology  Consultants: Cardiology  Procedures: Stress test today  Antimicrobials:  None  Subjective: Patient seen and examined at bedside.  Feels better.  Still having some mild intermittent chest pressure.  No overnight fever, nausea or vomiting.  Objective: Vitals:   11/12/18 0941 11/12/18 0944 11/12/18 1111 11/12/18 1514  BP: (!) 163/56 (!) 163/73 (!) 141/61 130/69  Pulse:   80 81  Resp: 12 15  16   Temp:   98 F (36.7 C) 98.1 F (36.7 C)  TempSrc:   Oral Oral  SpO2:   99% 99%   Weight:      Height:        Intake/Output Summary (Last 24 hours) at 11/12/2018 1601 Last data filed at 11/12/2018 1000 Gross per 24 hour  Intake 240 ml  Output 1100 ml  Net -860 ml   Filed Weights   11/11/18 0400 11/12/18 0046  Weight: 90 kg 91.7 kg    Examination:  General exam: Appears calm and comfortable.  No distress  respiratory system: Bilateral decreased breath sounds at bases Cardiovascular system: S1 & S2 heard, Rate controlled Gastrointestinal system: Abdomen is nondistended, soft and nontender. Normal bowel sounds heard. Extremities: No cyanosis, clubbing, edema    Data Reviewed: I have personally reviewed following labs and imaging studies  CBC: Recent Labs  Lab 11/10/18 2118 11/11/18 0541 11/12/18 0154  WBC 6.0 4.4 3.6*  NEUTROABS  --   --  1.7  HGB 12.5 10.8* 10.8*  HCT 39.7 34.4* 34.1*  MCV 92.3 93.5 94.2  PLT 268 198 503   Basic Metabolic Panel: Recent Labs  Lab 11/10/18 2118 11/11/18 0541 11/12/18 0154  NA 135 137 134*  K 4.9 4.5 4.7  CL 103 107 107  CO2 21* 21* 20*  GLUCOSE 118* 201* 215*  BUN 40* 40* 34*  CREATININE 3.14* 2.69* 1.63*  CALCIUM 10.8* 9.4 8.3*  MG  --   --  1.7   GFR: Estimated Creatinine Clearance: 40.6 mL/min (A) (by C-G formula based on SCr of 1.63 mg/dL (H)). Liver Function Tests: Recent Labs  Lab 11/12/18 0154  AST 37  ALT 29  ALKPHOS 27*  BILITOT 0.7  PROT 6.2*  ALBUMIN 3.2*   No results for input(s): LIPASE, AMYLASE in the last 168 hours. No results for input(s): AMMONIA in the last 168 hours. Coagulation Profile: No results for input(s): INR, PROTIME in the last 168 hours. Cardiac Enzymes: No results for input(s): CKTOTAL, CKMB, CKMBINDEX, TROPONINI in the last 168 hours. BNP (last 3 results) No results for input(s): PROBNP in the last 8760 hours. HbA1C: No results for input(s): HGBA1C in the last 72 hours. CBG: Recent Labs  Lab 11/11/18 1137 11/11/18 1624 11/11/18 2107 11/12/18 0612  11/12/18 1134  GLUCAP 145* 128* 171* 166* 196*   Lipid Profile: No results for input(s): CHOL, HDL, LDLCALC, TRIG, CHOLHDL, LDLDIRECT in the last 72 hours. Thyroid Function Tests: No results for input(s): TSH, T4TOTAL, FREET4, T3FREE, THYROIDAB in the last 72 hours. Anemia Panel: No results for input(s): VITAMINB12, FOLATE, FERRITIN, TIBC, IRON, RETICCTPCT in the last 72 hours. Sepsis Labs: No results for input(s): PROCALCITON, LATICACIDVEN in the last 168 hours.  Recent Results (from the past 240 hour(s))  SARS Coronavirus 2 (CEPHEID - Performed in [facility] hospital lab), Hosp Order     Status: None   Collection Time: 11/11/18  1:53 AM   Specimen: Nasopharyngeal Swab  Result Value Ref Range Status   SARS Coronavirus 2 NEGATIVE NEGATIVE Final    Comment: (NOTE) If result is NEGATIVE SARS-CoV-2 target nucleic acids are NOT DETECTED. The SARS-CoV-2 RNA is generally detectable in upper and lower  respiratory specimens during the acute phase of infection. The lowest  concentration of SARS-CoV-2 viral copies this assay can detect is 250  copies / mL. A negative result does not preclude SARS-CoV-2 infection  and should not be used as the sole basis for treatment or other  patient management decisions.  A negative result may occur with  improper specimen collection / handling, submission of specimen other  than nasopharyngeal swab, presence of viral mutation(s) within the  areas targeted by this assay, and inadequate number of viral copies  (<250 copies / mL). A negative result must be combined with clinical  observations, patient history, and epidemiological information. If result is POSITIVE SARS-CoV-2 target nucleic acids are DETECTED. The SARS-CoV-2 RNA is generally detectable in upper and lower  respiratory specimens dur ing the acute phase of infection.  Positive  results are indicative of active infection with SARS-CoV-2.  Clinical  correlation with patient history and other  diagnostic information is  necessary to determine patient infection status.  Positive results do  not rule out bacterial infection or co-infection with other viruses. If result is PRESUMPTIVE POSTIVE SARS-CoV-2 nucleic acids MAY BE PRESENT.   A presumptive positive result was obtained on the submitted specimen  and confirmed on repeat testing.  While 2019 novel coronavirus  (SARS-CoV-2) nucleic acids may be present in the submitted sample  additional confirmatory testing may be necessary for epidemiological  and / or clinical management purposes  to differentiate between  SARS-CoV-2 and other Sarbecovirus currently known to infect humans.  If clinically indicated additional testing with an alternate test  methodology [phone]) is advised. The SARS-CoV-2 RNA is generally  detectable in upper and lower respiratory sp ecimens during the acute  phase of infection. The expected result is Negative. Fact Sheet for Patients:  StrictlyIdeas.no Fact Sheet for Healthcare Providers: BankingDealers.co.za This test is not yet approved or cleared by the [place] FDA and has been authorized for detection and/or diagnosis of SARS-CoV-2 by FDA under an Emergency Use Authorization (  EUA).  This EUA will remain in effect (meaning this test can be used) for the duration of the COVID-19 declaration under Section 564(b)(1) of the Act, 21 U.S.C. section 360bbb-3(b)(1), unless the authorization is terminated or revoked sooner. Performed at [facility], [place] [address], [place], [place] [address]   MRSA PCR Screening     Status: None   Collection Time: 11/11/18  3:58 AM   Specimen: Nasopharyngeal  Result Value Ref Range Status   MRSA by PCR NEGATIVE NEGATIVE Final    Comment:        The GeneXpert MRSA Assay (FDA approved for NASAL specimens only), is one component of a comprehensive MRSA colonization surveillance program. It is not intended to  diagnose MRSA infection nor to guide or monitor treatment for MRSA infections. Performed at [facility], [place] [address], [place], [place] [address]      Scheduled Meds: . aspirin EC  81 mg Oral QHS  . fenofibrate  160 mg Oral Daily  . heparin  5,000 Units Subcutaneous Q8H  . insulin aspart  0-15 Units Subcutaneous TID WC  . insulin aspart  0-5 Units Subcutaneous QHS  . insulin glargine  45 Units Subcutaneous QHS  . isosorbide mononitrate  30 mg Oral Daily  . metoprolol succinate  25 mg Oral Daily  . regadenoson      . rosuvastatin  40 mg Oral QPM   Continuous Infusions: . sodium chloride       LOS: 0 days        Aline August, MD [facility] 11/12/2018, 4:01 PM

## 2018-11-12 NOTE — Progress Notes (Signed)
Lexiscan portion of test complete. Patient in no distress. 

## 2018-11-12 NOTE — H&P (View-Only) (Signed)
Progress Note  Patient Name: Haley George Date of Encounter: 11/12/2018  Primary Cardiologist: Sanda Klein, MD   Subjective   Mild chest pressure overnight. No dyspnea. Seen in nuc med.   Inpatient Medications    Scheduled Meds: . aspirin EC  81 mg Oral QHS  . heparin  5,000 Units Subcutaneous Q8H  . insulin aspart  0-15 Units Subcutaneous TID WC  . insulin aspart  0-5 Units Subcutaneous QHS  . insulin glargine  45 Units Subcutaneous QHS  . isosorbide mononitrate  30 mg Oral Daily  . metoprolol succinate  25 mg Oral Daily  . rosuvastatin  10 mg Oral QPM   Continuous Infusions:  PRN Meds: acetaminophen, morphine injection, ondansetron (ZOFRAN) IV, technetium tetrofosmin, zolpidem   Vital Signs    Vitals:   11/11/18 2011 11/11/18 2323 11/12/18 0046 11/12/18 0312  BP: (!) 120/58 (!) 116/55  118/64  Pulse: 75 80  76  Resp: 17 16  16   Temp: 97.9 F (36.6 C) 98.3 F (36.8 C)  98.1 F (36.7 C)  TempSrc: Oral Oral  Oral  SpO2: 100% 100%  100%  Weight:   91.7 kg   Height:        Intake/Output Summary (Last 24 hours) at 11/12/2018 0814 Last data filed at 11/11/2018 2011 Gross per 24 hour  Intake 480 ml  Output 1450 ml  Net -970 ml   Filed Weights   11/11/18 0400 11/12/18 0046  Weight: 90 kg 91.7 kg    Telemetry    Unable to review as patient seen in nuclear medicine   Tele:   SR Physical Exam   GEN: No acute distress.   Neck: No JVD, no carotid bruits Cardiac: RRR, no murmurs, rubs, or gallops.  Respiratory: Clear to auscultation bilaterally, no wheezes/ rales/ rhonchi GI: NABS, Soft, nontender, non-distended  MS: No edema; No deformity. Neuro:  Nonfocal, moving all extremities spontaneously Psych: Normal affect   Labs    Chemistry Recent Labs  Lab 11/10/18 2118 11/11/18 0541 11/12/18 0154  NA 135 137 134*  K 4.9 4.5 4.7  CL 103 107 107  CO2 21* 21* 20*  GLUCOSE 118* 201* 215*  BUN 40* 40* 34*  CREATININE 3.14* 2.69* 1.63*  CALCIUM  10.8* 9.4 8.3*  PROT  --   --  6.2*  ALBUMIN  --   --  3.2*  AST  --   --  37  ALT  --   --  29  ALKPHOS  --   --  27*  BILITOT  --   --  0.7  GFRNONAA 15* 18* 34*  GFRAA 18* 21* 39*  ANIONGAP 11 9 7      Hematology Recent Labs  Lab 11/10/18 2118 11/11/18 0541 11/12/18 0154  WBC 6.0 4.4 3.6*  RBC 4.30 3.68* 3.62*  HGB 12.5 10.8* 10.8*  HCT 39.7 34.4* 34.1*  MCV 92.3 93.5 94.2  MCH 29.1 29.3 29.8  MCHC 31.5 31.4 31.7  RDW 13.3 13.4 13.2  PLT 268 198 189    Radiology    Dg Chest 2 View  Result Date: 11/10/2018 CLINICAL DATA:  Chest pain and shortness of breath. EXAM: CHEST - 2 VIEW COMPARISON:  02/27/2017 FINDINGS: The cardiomediastinal contours are normal. Coronary stent visualized. The lungs are clear. Pulmonary vasculature is normal. No consolidation, pleural effusion, or pneumothorax. No acute osseous abnormalities are seen. IMPRESSION: No acute chest findings. Electronically Signed   By: Keith Rake M.D.   On: 11/10/2018 22:03  Cardiac Studies    Pending stress test   Patient Profile     63 y.o. female with a PMH of CAD s/p multiple PCI, HTN, HLD, DM type 2, and OA who is being followed by cardiology for chest pain  Assessment & Plan    1. Chest pain in patient with CAD s/p PCI to all 3 major coronary arteries and restenosis requiring repeat PCI: patient presented with exertional chest pain which started 11/09/2018 and persisted throughout the weekend prompting her to present to the ED. EKG was non-ischemic. Trops negative at 11 x2.  - Pending NST. Patient report prior false negative test. Currently 2/10 mild chest pressure, similar to prior.  - Continue aspirin and statin - Continue imdur and metoprolol succinate  2. HLD/hypertriglyceridemia: Tcholesterol 143, HDL 15, unable to calculate LDL, Triglycerides 598. On crestor 20mg  daily. - Will increase crestor to 40mg  daily for risk factor modifications - Will start fenofibrate for elevated triglycerides -  Consider referral to lipid clinic for PSK-9i consideration  3. HTN: BP overall stable - Continue imdur and metoprolol succinate  4. AKI: Cr elevated to 3.14 on admission, down to 1.63 today after IVF. Baseline ~1. - Continue management per primary team    For questions or updates, please contact [place] Please consult [email or web address] for contact info under Cardiology/STEMI.      Signed, [name], PA   PT seen and examined   I agree with findings as noted by B Bhagat above   PT with known CAD   Presents with CP that hat typcial/atypcial features  Prolonged  Trop neg  Just back from myovue  Lungs CTA Cardiac RRR  Normal S1, S2    Abd supple Ext are without edema   Await test results   Did sayshe had a false negative in past  Agree with fenofibrate for triglycerides   Cr improving   Still not normal   ANother reason for noninvasive eval at this time  Dorris Carnes, MD

## 2018-11-12 NOTE — Progress Notes (Addendum)
Progress Note  Patient Name: Haley George Date of Encounter: 11/12/2018  Primary Cardiologist: Sanda Klein, MD   Subjective   Mild chest pressure overnight. No dyspnea. Seen in nuc med.   Inpatient Medications    Scheduled Meds: . aspirin EC  81 mg Oral QHS  . heparin  5,000 Units Subcutaneous Q8H  . insulin aspart  0-15 Units Subcutaneous TID WC  . insulin aspart  0-5 Units Subcutaneous QHS  . insulin glargine  45 Units Subcutaneous QHS  . isosorbide mononitrate  30 mg Oral Daily  . metoprolol succinate  25 mg Oral Daily  . rosuvastatin  10 mg Oral QPM   Continuous Infusions:  PRN Meds: acetaminophen, morphine injection, ondansetron (ZOFRAN) IV, technetium tetrofosmin, zolpidem   Vital Signs    Vitals:   11/11/18 2011 11/11/18 2323 11/12/18 0046 11/12/18 0312  BP: (!) 120/58 (!) 116/55  118/64  Pulse: 75 80  76  Resp: 17 16  16   Temp: 97.9 F (36.6 C) 98.3 F (36.8 C)  98.1 F (36.7 C)  TempSrc: Oral Oral  Oral  SpO2: 100% 100%  100%  Weight:   91.7 kg   Height:        Intake/Output Summary (Last 24 hours) at 11/12/2018 0814 Last data filed at 11/11/2018 2011 Gross per 24 hour  Intake 480 ml  Output 1450 ml  Net -970 ml   Filed Weights   11/11/18 0400 11/12/18 0046  Weight: 90 kg 91.7 kg    Telemetry    Unable to review as patient seen in nuclear medicine   Tele:   SR Physical Exam   GEN: No acute distress.   Neck: No JVD, no carotid bruits Cardiac: RRR, no murmurs, rubs, or gallops.  Respiratory: Clear to auscultation bilaterally, no wheezes/ rales/ rhonchi GI: NABS, Soft, nontender, non-distended  MS: No edema; No deformity. Neuro:  Nonfocal, moving all extremities spontaneously Psych: Normal affect   Labs    Chemistry Recent Labs  Lab 11/10/18 2118 11/11/18 0541 11/12/18 0154  NA 135 137 134*  K 4.9 4.5 4.7  CL 103 107 107  CO2 21* 21* 20*  GLUCOSE 118* 201* 215*  BUN 40* 40* 34*  CREATININE 3.14* 2.69* 1.63*  CALCIUM  10.8* 9.4 8.3*  PROT  --   --  6.2*  ALBUMIN  --   --  3.2*  AST  --   --  37  ALT  --   --  29  ALKPHOS  --   --  27*  BILITOT  --   --  0.7  GFRNONAA 15* 18* 34*  GFRAA 18* 21* 39*  ANIONGAP 11 9 7      Hematology Recent Labs  Lab 11/10/18 2118 11/11/18 0541 11/12/18 0154  WBC 6.0 4.4 3.6*  RBC 4.30 3.68* 3.62*  HGB 12.5 10.8* 10.8*  HCT 39.7 34.4* 34.1*  MCV 92.3 93.5 94.2  MCH 29.1 29.3 29.8  MCHC 31.5 31.4 31.7  RDW 13.3 13.4 13.2  PLT 268 198 189    Radiology    Dg Chest 2 View  Result Date: 11/10/2018 CLINICAL DATA:  Chest pain and shortness of breath. EXAM: CHEST - 2 VIEW COMPARISON:  02/27/2017 FINDINGS: The cardiomediastinal contours are normal. Coronary stent visualized. The lungs are clear. Pulmonary vasculature is normal. No consolidation, pleural effusion, or pneumothorax. No acute osseous abnormalities are seen. IMPRESSION: No acute chest findings. Electronically Signed   By: Keith Rake M.D.   On: 11/10/2018 22:03  Cardiac Studies    Pending stress test   Patient Profile     63 y.o. female with a PMH of CAD s/p multiple PCI, HTN, HLD, DM type 2, and OA who is being followed by cardiology for chest pain  Assessment & Plan    1. Chest pain in patient with CAD s/p PCI to all 3 major coronary arteries and restenosis requiring repeat PCI: patient presented with exertional chest pain which started 11/09/2018 and persisted throughout the weekend prompting her to present to the ED. EKG was non-ischemic. Trops negative at 11 x2.  - Pending NST. Patient report prior false negative test. Currently 2/10 mild chest pressure, similar to prior.  - Continue aspirin and statin - Continue imdur and metoprolol succinate  2. HLD/hypertriglyceridemia: Tcholesterol 143, HDL 15, unable to calculate LDL, Triglycerides 598. On crestor 20mg  daily. - Will increase crestor to 40mg  daily for risk factor modifications - Will start fenofibrate for elevated triglycerides -  Consider referral to lipid clinic for PSK-9i consideration  3. HTN: BP overall stable - Continue imdur and metoprolol succinate  4. AKI: Cr elevated to 3.14 on admission, down to 1.63 today after IVF. Baseline ~1. - Continue management per primary team    For questions or updates, please contact [place] Please consult [email or web address] for contact info under Cardiology/STEMI.      Signed, [name], PA   PT seen and examined   I agree with findings as noted by B Bhagat above   PT with known CAD   Presents with CP that hat typcial/atypcial features  Prolonged  Trop neg  Just back from myovue  Lungs CTA Cardiac RRR  Normal S1, S2    Abd supple Ext are without edema   Await test results   Did sayshe had a false negative in past  Agree with fenofibrate for triglycerides   Cr improving   Still not normal   ANother reason for noninvasive eval at this time  Dorris Carnes, MD

## 2018-11-12 NOTE — Progress Notes (Signed)
Patient ID: Haley George, female   DOB: 03-07-1956, 63 y.o.   MRN: [id number] Stress test done today: IMPRESSION: 1. Moderate-sized reversible defect in the anteroseptal wall compatible with ischemia.  2. Normal left ventricular wall motion.  3. Left ventricular ejection fraction 52%  4. Non invasive risk stratification*: Intermediate  Spoke to Dr. Ross/cardiology on phone about the same.  She recommended the patient needs to remain in the hospital and patient might need cardiac cath in a.m.  We put her back on some IV fluids.  Repeat a.m. labs.  Keep n.p.o. past midnight. We will change her from observation to inpatient as patient will remain to stay in the hospital for further cardiac work-up and treatment.  Still having intermittent chest pressures.

## 2018-11-13 ENCOUNTER — Encounter (HOSPITAL_COMMUNITY)
Admission: EM | Disposition: A | Discharge status: Home / Self Care | Payer: Self-pay | Source: Home / Self Care | Attending: Internal Medicine

## 2018-11-13 DIAGNOSIS — I25118 Atherosclerotic heart disease of native coronary artery with other forms of angina pectoris: Principal | ICD-10-CM

## 2018-11-13 DIAGNOSIS — N179 Acute kidney failure, unspecified: Secondary | ICD-10-CM

## 2018-11-13 DIAGNOSIS — I2510 Atherosclerotic heart disease of native coronary artery without angina pectoris: Secondary | ICD-10-CM

## 2018-11-13 DIAGNOSIS — E785 Hyperlipidemia, unspecified: Secondary | ICD-10-CM

## 2018-11-13 HISTORY — PX: CORONARY STENT INTERVENTION: CATH118234

## 2018-11-13 HISTORY — PX: LEFT HEART CATH AND CORONARY ANGIOGRAPHY: CATH118249

## 2018-11-13 LAB — GLUCOSE, CAPILLARY
Glucose-Capillary: 151 mg/dL — ABNORMAL HIGH (ref 70–99)
Glucose-Capillary: 142 mg/dL — ABNORMAL HIGH (ref 70–99)
Glucose-Capillary: 107 mg/dL — ABNORMAL HIGH (ref 70–99)
Glucose-Capillary: 169 mg/dL — ABNORMAL HIGH (ref 70–99)
Glucose-Capillary: 190 mg/dL — ABNORMAL HIGH (ref 70–99)

## 2018-11-13 LAB — BASIC METABOLIC PANEL
Anion gap: 6 (ref 5–15)
BUN: 22 mg/dL (ref 8–23)
CO2: 21 mmol/L — ABNORMAL LOW (ref 22–32)
Calcium: 8.2 mg/dL — ABNORMAL LOW (ref 8.9–10.3)
Chloride: 111 mmol/L (ref 98–111)
Creatinine, Ser: 1.19 mg/dL — ABNORMAL HIGH (ref 0.44–1.00)
GFR calc Af Amer: 57 mL/min — ABNORMAL LOW (ref >60)
GFR calc non Af Amer: 49 mL/min — ABNORMAL LOW (ref >60)
Glucose, Bld: 157 mg/dL — ABNORMAL HIGH (ref 70–99)
Potassium: 4.8 mmol/L (ref 3.5–5.1)
Sodium: 138 mmol/L (ref 135–145)

## 2018-11-13 LAB — POCT ACTIVATED CLOTTING TIME
Activated Clotting Time: 213 seconds
Activated Clotting Time: 334 seconds

## 2018-11-13 SURGERY — LEFT HEART CATH AND CORONARY ANGIOGRAPHY
Anesthesia: LOCAL

## 2018-11-13 MED ORDER — ONDANSETRON HCL 4 MG/2ML IJ SOLN: 4.0000 mg | Freq: Four times a day (QID) | INTRAMUSCULAR | Status: DC | PRN

## 2018-11-13 MED ORDER — ASPIRIN 81 MG PO CHEW: 81.0000 mg | CHEWABLE_TABLET | Freq: Every day | ORAL | Status: DC

## 2018-11-13 MED ORDER — MIDAZOLAM HCL 2 MG/2ML IJ SOLN
INTRAMUSCULAR | Status: AC
  Filled 2018-11-13: qty 2

## 2018-11-13 MED ORDER — LIDOCAINE HCL (PF) 1 % IJ SOLN
INTRAMUSCULAR | Status: DC | PRN
  Administered 2018-11-13: 2 mL via INTRADERMAL

## 2018-11-13 MED ORDER — SODIUM CHLORIDE 0.9% FLUSH: 3.0000 mL | Freq: Two times a day (BID) | INTRAVENOUS | Status: DC

## 2018-11-13 MED ORDER — FENTANYL CITRATE (PF) 100 MCG/2ML IJ SOLN
INTRAMUSCULAR | Status: AC
  Filled 2018-11-13: qty 2

## 2018-11-13 MED ORDER — SODIUM CHLORIDE 0.9% FLUSH: 3.0000 mL | INTRAVENOUS | Status: DC | PRN

## 2018-11-13 MED ORDER — ASPIRIN 81 MG PO CHEW
81.0000 mg | CHEWABLE_TABLET | ORAL | Status: AC
  Administered 2018-11-13: 81 mg via ORAL
  Filled 2018-11-13: qty 1

## 2018-11-13 MED ORDER — SODIUM CHLORIDE 0.9 % IV SOLN: INTRAVENOUS | Status: AC

## 2018-11-13 MED ORDER — HEPARIN SODIUM (PORCINE) 1000 UNIT/ML IJ SOLN
INTRAMUSCULAR | Status: DC | PRN
  Administered 2018-11-13: 5000 [IU] via INTRAVENOUS
  Administered 2018-11-13: 4500 [IU] via INTRAVENOUS
  Administered 2018-11-13: 3000 [IU] via INTRAVENOUS
  Administered 2018-11-13: 5000 [IU] via INTRAVENOUS

## 2018-11-13 MED ORDER — SODIUM CHLORIDE 0.9 % IV SOLN: 250.0000 mL | INTRAVENOUS | Status: DC | PRN

## 2018-11-13 MED ORDER — VERAPAMIL HCL 2.5 MG/ML IV SOLN
INTRAVENOUS | Status: DC | PRN
  Administered 2018-11-13: 10 mL via INTRA_ARTERIAL

## 2018-11-13 MED ORDER — LABETALOL HCL 5 MG/ML IV SOLN: 10.0000 mg | INTRAVENOUS | Status: AC | PRN

## 2018-11-13 MED ORDER — HEPARIN (PORCINE) IN NACL 1000-0.9 UT/500ML-% IV SOLN
INTRAVENOUS | Status: DC | PRN
  Administered 2018-11-13: 500 mL

## 2018-11-13 MED ORDER — MIDAZOLAM HCL 2 MG/2ML IJ SOLN
INTRAMUSCULAR | Status: DC | PRN
  Administered 2018-11-13: 2 mg via INTRAVENOUS
  Administered 2018-11-13 (×3): 1 mg via INTRAVENOUS

## 2018-11-13 MED ORDER — HYDRALAZINE HCL 20 MG/ML IJ SOLN: 10.0000 mg | INTRAMUSCULAR | Status: AC | PRN

## 2018-11-13 MED ORDER — CLOPIDOGREL BISULFATE 75 MG PO TABS
75.0000 mg | ORAL_TABLET | Freq: Every day | ORAL | Status: DC
  Administered 2018-11-14: 75 mg via ORAL
  Filled 2018-11-13: qty 1

## 2018-11-13 MED ORDER — SODIUM CHLORIDE 0.9 % IV SOLN
INTRAVENOUS | Status: AC | PRN
  Administered 2018-11-13: 100 mL/h via INTRAVENOUS

## 2018-11-13 MED ORDER — VERAPAMIL HCL 2.5 MG/ML IV SOLN
INTRAVENOUS | Status: AC
  Filled 2018-11-13: qty 2

## 2018-11-13 MED ORDER — HEPARIN SODIUM (PORCINE) 1000 UNIT/ML IJ SOLN
INTRAMUSCULAR | Status: AC
  Filled 2018-11-13: qty 1

## 2018-11-13 MED ORDER — NITROGLYCERIN 1 MG/10 ML FOR IR/CATH LAB
INTRA_ARTERIAL | Status: AC
  Filled 2018-11-13: qty 10

## 2018-11-13 MED ORDER — LIDOCAINE HCL (PF) 1 % IJ SOLN
INTRAMUSCULAR | Status: AC
  Filled 2018-11-13: qty 30

## 2018-11-13 MED ORDER — ACETAMINOPHEN 325 MG PO TABS: 650.0000 mg | ORAL_TABLET | ORAL | Status: DC | PRN

## 2018-11-13 MED ORDER — HEPARIN (PORCINE) IN NACL 1000-0.9 UT/500ML-% IV SOLN
INTRAVENOUS | Status: AC
  Filled 2018-11-13: qty 1000

## 2018-11-13 MED ORDER — SODIUM CHLORIDE 0.9 % IV SOLN: INTRAVENOUS | Status: DC

## 2018-11-13 MED ORDER — NITROGLYCERIN 1 MG/10 ML FOR IR/CATH LAB
INTRA_ARTERIAL | Status: DC | PRN
  Administered 2018-11-13 (×2): 200 ug via INTRACORONARY
  Administered 2018-11-13: 400 ug via INTRA_ARTERIAL

## 2018-11-13 MED ORDER — FENTANYL CITRATE (PF) 100 MCG/2ML IJ SOLN
INTRAMUSCULAR | Status: DC | PRN
  Administered 2018-11-13 (×4): 25 ug via INTRAVENOUS

## 2018-11-13 MED ORDER — IOHEXOL 350 MG/ML SOLN
INTRAVENOUS | Status: DC | PRN
  Administered 2018-11-13: 13:00:00 110 mL via INTRACARDIAC

## 2018-11-13 MED ORDER — HEPARIN (PORCINE) IN NACL 1000-0.9 UT/500ML-% IV SOLN
INTRAVENOUS | Status: DC | PRN
  Administered 2018-11-13 (×2): 500 mL

## 2018-11-13 MED ORDER — CLOPIDOGREL BISULFATE 300 MG PO TABS
ORAL_TABLET | ORAL | Status: AC
  Filled 2018-11-13: qty 2

## 2018-11-13 MED ORDER — CLOPIDOGREL BISULFATE 300 MG PO TABS
ORAL_TABLET | ORAL | Status: DC | PRN
  Administered 2018-11-13: 600 mg via ORAL

## 2018-11-13 MED ORDER — ASPIRIN 81 MG PO CHEW
81.0000 mg | CHEWABLE_TABLET | Freq: Every day | ORAL | Status: DC
  Administered 2018-11-14: 81 mg via ORAL
  Filled 2018-11-13: qty 1

## 2018-11-13 SURGICAL SUPPLY — 22 items
BALLN SAPPHIRE ~~LOC~~ 2.5X15 (BALLOONS) ×2 IMPLANT
BALLN SAPPHIRE ~~LOC~~ 2.75X12 (BALLOONS) ×1 IMPLANT
BALLN WOLVERINE 2.50X10 (BALLOONS) ×2
BALLOON WOLVERINE 2.50X10 (BALLOONS) ×1 IMPLANT
CATH 5FR JL3.5 JR4 ANG PIG MP (CATHETERS) ×2 IMPLANT
CATH LAUNCHER 6FR EBU3.5 (CATHETERS) ×2 IMPLANT
COVER DOME SNAP 22 D (MISCELLANEOUS) ×2 IMPLANT
DEVICE RAD TR BAND REGULAR (VASCULAR PRODUCTS) ×2 IMPLANT
GUIDEWIRE INQWIRE 1.5J.035X260 (WIRE) ×1 IMPLANT
INQWIRE 1.5J .035X260CM (WIRE) ×2
KIT ENCORE 26 ADVANTAGE (KITS) ×2 IMPLANT
KIT HEART LEFT (KITS) ×2 IMPLANT
KIT HEMO VALVE WATCHDOG (MISCELLANEOUS) ×2 IMPLANT
PACK CARDIAC CATHETERIZATION (CUSTOM PROCEDURE TRAY) ×2 IMPLANT
SHEATH PROBE COVER 6X72 (BAG) ×2 IMPLANT
SHEATH RAIN RADIAL 21G 6FR (SHEATH) ×2 IMPLANT
STENT SYNERGY DES 2.5X12 (Permanent Stent) ×1 IMPLANT
TRANSDUCER W/STOPCOCK (MISCELLANEOUS) ×2 IMPLANT
TUBING CIL FLEX 10 FLL-RA (TUBING) ×2 IMPLANT
WIRE ASAHI PROWATER 180CM (WIRE) ×2 IMPLANT
WIRE HI TORQ BMW 190CM (WIRE) ×2 IMPLANT
WIRE HI TORQ VERSACORE-J 145CM (WIRE) ×2 IMPLANT

## 2018-11-13 NOTE — Interval H&P Note (Signed)
Cath Lab Visit (complete for each Cath Lab visit)  Clinical Evaluation Leading to the Procedure:   ACS: Yes.    Non-ACS:    Anginal Classification: CCS IV  Anti-ischemic medical therapy: Minimal Therapy (1 class of medications)  Non-Invasive Test Results: Intermediate-risk stress test findings: cardiac mortality 1-3%/year  Prior CABG: No previous CABG  Abnormal stress test.    History and Physical Interval Note:  11/13/2018 11:40 AM  Haley George  has presented today for surgery, with the diagnosis of aortic stenosis.  The various methods of treatment have been discussed with the patient and family. After consideration of risks, benefits and other options for treatment, the patient has consented to  Procedure(s): LEFT HEART CATH AND CORONARY ANGIOGRAPHY (N/A) as a surgical intervention.  The patient's history has been reviewed, patient examined, no change in status, stable for surgery.  I have reviewed the patient's chart and labs.  Questions were answered to the patient's satisfaction.     Larae Grooms

## 2018-11-13 NOTE — Progress Notes (Signed)
Patient ID: ARLANA CANIZALES, female   DOB: 03/20/1956, 63 y.o.   MRN: [id number]  PROGRESS NOTE    KAIRY FOLSOM  BMW:[id number] DOB: 1955-11-08 DOA: 11/10/2018 PCP: Denita Lung, MD   Brief Narrative:  63 year old female with history of coronary artery disease with stents, diabetes mellitus and osteoarthritis presented with chest pain.  Initial high-sensitivity troponin was normal.  She was found to have creatinine of 3.18, up from 1.05 in December 2019.  She was treated with IV fluids.  Cardiology was consulted. Stress test done on 11/12/2018 showed moderate sized reversible defect in the anteroseptal wall compatible with ischemia.  Assessment & Plan:   Chest pain in a patient with coronary disease and stents -Initial troponins negative.  Cardiology following.  Stress test done on 11/12/2018 showed moderate sized reversible defect in the anteroseptal wall compatible with ischemia. -For cardiac cath today.  Follow further cardiology recommendations. -Continue aspirin, metoprolol, statin  Acute kidney injury -Presented with creatinine of 3.18; up from 1.05 in December 2019.  Probably secondary to prerenal azotemia from dehydration and working outside in the heat -Treated with IV fluids.  Creatinine improving to 1.19 today. -Monitor.  Diabetes mellitus type 2 -A1c 9.2 in May 2 and 20 -Continue Lantus.  Continue CBGs with SSI  Hypertension -Continue metoprolol, isosorbide mononitrate  Hyperlipidemia/hypertriglyceridemia -Continue higher doses of Crestor.  Fenofibrate has been started as well.  DVT prophylaxis: Heparin Code Status: Full Family Communication: Spoke to patient at bedside Disposition Plan: Home probably tomorrow if cleared by cardiology.  Consultants: Cardiology  Procedures:  Stress test on 11/12/2018 IMPRESSION: 1. Moderate-sized reversible defect in the anteroseptal wall compatible with ischemia.  2. Normal left ventricular wall motion.  3. Left ventricular  ejection fraction 52%  4. Non invasive risk stratification*: Intermediate  Antimicrobials:  None  Subjective: Patient seen and examined at bedside.  Feels a little anxious.  Denies any current chest pain.  No overnight fever, nausea or vomiting. Objective: Vitals:   11/13/18 1250 11/13/18 1255 11/13/18 1300 11/13/18 1304  BP: (!) 159/104 138/87 (!) 147/91 (!) 144/90  Pulse: 79 78 79 79  Resp: 13 11 12  (!) 7  Temp:      TempSrc:      SpO2: 100% 96% 99% (!) 0%  Weight:      Height:        Intake/Output Summary (Last 24 hours) at 11/13/2018 1328 Last data filed at 11/13/2018 0242 Gross per 24 hour  Intake 1105.65 ml  Output -  Net 1105.65 ml   Filed Weights   11/11/18 0400 11/12/18 0046 11/13/18 0308  Weight: 90 kg 91.7 kg 91.9 kg    Examination:  General exam: No acute distress respiratory system: Bilateral decreased breath sounds at bases.  No wheezing Cardiovascular system: Rate controlled, S1-S2 heard Gastrointestinal system: Abdomen is nondistended, soft and nontender. Normal bowel sounds heard. Extremities: No cyanosis, edema    Data Reviewed: I have personally reviewed following labs and imaging studies  CBC: Recent Labs  Lab 11/10/18 2118 11/11/18 0541 11/12/18 0154  WBC 6.0 4.4 3.6*  NEUTROABS  --   --  1.7  HGB 12.5 10.8* 10.8*  HCT 39.7 34.4* 34.1*  MCV 92.3 93.5 94.2  PLT 268 198 272   Basic Metabolic Panel: Recent Labs  Lab 11/10/18 2118 11/11/18 0541 11/12/18 0154 11/13/18 0723  NA 135 137 134* 138  K 4.9 4.5 4.7 4.8  CL 103 107 107 111  CO2 21* 21* 20* 21*  GLUCOSE  118* 201* 215* 157*  BUN 40* 40* 34* 22  CREATININE 3.14* 2.69* 1.63* 1.19*  CALCIUM 10.8* 9.4 8.3* 8.2*  MG  --   --  1.7  --    GFR: Estimated Creatinine Clearance: 55.6 mL/min (A) (by C-G formula based on SCr of 1.19 mg/dL (H)). Liver Function Tests: Recent Labs  Lab 11/12/18 0154  AST 37  ALT 29  ALKPHOS 27*  BILITOT 0.7  PROT 6.2*  ALBUMIN 3.2*   No  results for input(s): LIPASE, AMYLASE in the last 168 hours. No results for input(s): AMMONIA in the last 168 hours. Coagulation Profile: No results for input(s): INR, PROTIME in the last 168 hours. Cardiac Enzymes: No results for input(s): CKTOTAL, CKMB, CKMBINDEX, TROPONINI in the last 168 hours. BNP (last 3 results) No results for input(s): PROBNP in the last 8760 hours. HbA1C: No results for input(s): HGBA1C in the last 72 hours. CBG: Recent Labs  Lab 11/12/18 1134 11/12/18 1614 11/12/18 2134 11/13/18 0604 11/13/18 1048  GLUCAP 196* 152* 208* 151* 142*   Lipid Profile: No results for input(s): CHOL, HDL, LDLCALC, TRIG, CHOLHDL, LDLDIRECT in the last 72 hours. Thyroid Function Tests: No results for input(s): TSH, T4TOTAL, FREET4, T3FREE, THYROIDAB in the last 72 hours. Anemia Panel: No results for input(s): VITAMINB12, FOLATE, FERRITIN, TIBC, IRON, RETICCTPCT in the last 72 hours. Sepsis Labs: No results for input(s): PROCALCITON, LATICACIDVEN in the last 168 hours.  Recent Results (from the past 240 hour(s))  SARS Coronavirus 2 (CEPHEID - Performed in [place] hospital lab), Hosp Order     Status: None   Collection Time: 11/11/18  1:53 AM   Specimen: Nasopharyngeal Swab  Result Value Ref Range Status   SARS Coronavirus 2 NEGATIVE NEGATIVE Final    Comment: (NOTE) If result is NEGATIVE SARS-CoV-2 target nucleic acids are NOT DETECTED. The SARS-CoV-2 RNA is generally detectable in upper and lower  respiratory specimens during the acute phase of infection. The lowest  concentration of SARS-CoV-2 viral copies this assay can detect is 250  copies / mL. A negative result does not preclude SARS-CoV-2 infection  and should not be used as the sole basis for treatment or other  patient management decisions.  A negative result may occur with  improper specimen collection / handling, submission of specimen other  than nasopharyngeal swab, presence of viral mutation(s) within  the  areas targeted by this assay, and inadequate number of viral copies  (<250 copies / mL). A negative result must be combined with clinical  observations, patient history, and epidemiological information. If result is POSITIVE SARS-CoV-2 target nucleic acids are DETECTED. The SARS-CoV-2 RNA is generally detectable in upper and lower  respiratory specimens dur ing the acute phase of infection.  Positive  results are indicative of active infection with SARS-CoV-2.  Clinical  correlation with patient history and other diagnostic information is  necessary to determine patient infection status.  Positive results do  not rule out bacterial infection or co-infection with other viruses. If result is PRESUMPTIVE POSTIVE SARS-CoV-2 nucleic acids MAY BE PRESENT.   A presumptive positive result was obtained on the submitted specimen  and confirmed on repeat testing.  While 2019 novel coronavirus  (SARS-CoV-2) nucleic acids may be present in the submitted sample  additional confirmatory testing may be necessary for epidemiological  and / or clinical management purposes  to differentiate between  SARS-CoV-2 and other Sarbecovirus currently known to infect humans.  If clinically indicated additional testing with an alternate test  methodology [phone]) is advised. The SARS-CoV-2 RNA is generally  detectable in upper and lower respiratory sp ecimens during the acute  phase of infection. The expected result is Negative. Fact Sheet for Patients:  StrictlyIdeas.no Fact Sheet for Healthcare Providers: BankingDealers.co.za This test is not yet approved or cleared by the [place] FDA and has been authorized for detection and/or diagnosis of SARS-CoV-2 by FDA under an Emergency Use Authorization (EUA).  This EUA will remain in effect (meaning this test can be used) for the duration of the COVID-19 declaration under Section 564(b)(1) of the Act, 21  U.S.C. section 360bbb-3(b)(1), unless the authorization is terminated or revoked sooner. Performed at [facility], [place] [address], [place], [place] [address]   MRSA PCR Screening     Status: None   Collection Time: 11/11/18  3:58 AM   Specimen: Nasopharyngeal  Result Value Ref Range Status   MRSA by PCR NEGATIVE NEGATIVE Final    Comment:        The GeneXpert MRSA Assay (FDA approved for NASAL specimens only), is one component of a comprehensive MRSA colonization surveillance program. It is not intended to diagnose MRSA infection nor to guide or monitor treatment for MRSA infections. Performed at [facility], [place] [address] [place], [place] [address]      Scheduled Meds: . [MAR Hold] aspirin EC  81 mg Oral QHS  . [MAR Hold] fenofibrate  160 mg Oral Daily  . [MAR Hold] heparin  5,000 Units Subcutaneous Q8H  . [MAR Hold] insulin aspart  0-15 Units Subcutaneous TID WC  . [MAR Hold] insulin aspart  0-5 Units Subcutaneous QHS  . [MAR Hold] insulin glargine  45 Units Subcutaneous QHS  . [MAR Hold] isosorbide mononitrate  30 mg Oral Daily  . [MAR Hold] metoprolol succinate  25 mg Oral Daily  . [MAR Hold] rosuvastatin  40 mg Oral QPM  . [MAR Hold] Semaglutide(0.25 or 0.5MG /DOS)  0.5 mg Subcutaneous Q Mon  . sodium chloride flush  3 mL Intravenous Q12H   Continuous Infusions: . sodium chloride 100 mL/hr at 11/13/18 0242  . sodium chloride    . sodium chloride       LOS: 1 day        Aline August, MD [facility] 11/13/2018, 1:28 PM

## 2018-11-13 NOTE — Progress Notes (Signed)
Progress Note  Patient Name: Haley George Date of Encounter: 11/13/2018  Primary Cardiologist: Sanda Klein, MD   Subjective   Currently denies CP  Breathing is OK  Inpatient Medications    Scheduled Meds:  aspirin EC  81 mg Oral QHS   fenofibrate  160 mg Oral Daily   heparin  5,000 Units Subcutaneous Q8H   insulin aspart  0-15 Units Subcutaneous TID WC   insulin aspart  0-5 Units Subcutaneous QHS   insulin glargine  45 Units Subcutaneous QHS   isosorbide mononitrate  30 mg Oral Daily   metoprolol succinate  25 mg Oral Daily   rosuvastatin  40 mg Oral QPM   Semaglutide(0.25 or 0.5MG /DOS)  0.5 mg Subcutaneous Q Mon   sodium chloride flush  3 mL Intravenous Q12H   Continuous Infusions:  sodium chloride 100 mL/hr at 11/13/18 0242   sodium chloride     sodium chloride     PRN Meds: sodium chloride, acetaminophen, morphine injection, ondansetron (ZOFRAN) IV, sodium chloride flush, zolpidem   Vital Signs    Vitals:   11/12/18 1514 11/12/18 2040 11/12/18 2336 11/13/18 0308  BP: 130/69 139/74 138/67 (!) 148/73  Pulse: 81     Resp: 16 16 (!) 24 15  Temp: 98.1 F (36.7 C) 98.4 F (36.9 C) 98.3 F (36.8 C) 98.4 F (36.9 C)  TempSrc: Oral Oral Oral Oral  SpO2: 99% 98%    Weight:    91.9 kg  Height:        Intake/Output Summary (Last 24 hours) at 11/13/2018 0653 Last data filed at 11/12/2018 1624 Gross per 24 hour  Intake 100 ml  Output 300 ml  Net -200 ml   Filed Weights   11/11/18 0400 11/12/18 0046 11/13/18 0308  Weight: 90 kg 91.7 kg 91.9 kg    Telemetry    SR  Personally reviewed  Tele:   SR Physical Exam   GEN: No acute distress.   Neck: No JVD, no carotid bruits Cardiac: RRR, no murmurs, rubs, or gallops.  Respiratory: Clear to auscultation bilaterally, no wheezes/ rales/ rhonchi GI: NABS, Soft, nontender, non-distended  MS: No edema; No deformity.  R wrist:   Mild swelling above radial site  Soft   Wristband on Neuro:   Nonfocal, moving all extremities spontaneously Psych: Normal affect   Labs    Chemistry Recent Labs  Lab 11/10/18 2118 11/11/18 0541 11/12/18 0154  NA 135 137 134*  K 4.9 4.5 4.7  CL 103 107 107  CO2 21* 21* 20*  GLUCOSE 118* 201* 215*  BUN 40* 40* 34*  CREATININE 3.14* 2.69* 1.63*  CALCIUM 10.8* 9.4 8.3*  PROT  --   --  6.2*  ALBUMIN  --   --  3.2*  AST  --   --  37  ALT  --   --  29  ALKPHOS  --   --  27*  BILITOT  --   --  0.7  GFRNONAA 15* 18* 34*  GFRAA 18* 21* 39*  ANIONGAP 11 9 7      Hematology Recent Labs  Lab 11/10/18 2118 11/11/18 0541 11/12/18 0154  WBC 6.0 4.4 3.6*  RBC 4.30 3.68* 3.62*  HGB 12.5 10.8* 10.8*  HCT 39.7 34.4* 34.1*  MCV 92.3 93.5 94.2  MCH 29.1 29.3 29.8  MCHC 31.5 31.4 31.7  RDW 13.3 13.4 13.2  PLT 268 198 189    Radiology    Nm Myocar Multi W/spect W/wall Motion / Ef  Result Date: 11/12/2018 CLINICAL DATA:  Chest pain EXAM: MYOCARDIAL IMAGING WITH SPECT (REST AND PHARMACOLOGIC-STRESS) GATED LEFT VENTRICULAR WALL MOTION STUDY LEFT VENTRICULAR EJECTION FRACTION TECHNIQUE: Standard myocardial SPECT imaging was performed after resting intravenous injection of 10 mCi Tc-38m tetrofosmin. Subsequently, intravenous infusion of Lexiscan was performed under the supervision of the Cardiology staff. At peak effect of the drug, 30 mCi Tc-20m tetrofosmin was injected intravenously and standard myocardial SPECT imaging was performed. Quantitative gated imaging was also performed to evaluate left ventricular wall motion, and estimate left ventricular ejection fraction. COMPARISON:  None. FINDINGS: Perfusion: There is a moderate-sized defect on stress imaging within the anteroseptal wall which appears normal on rest images compatible with ischemia. Wall Motion: Normal left ventricular wall motion. No left ventricular dilation. Left Ventricular Ejection Fraction: 52 % End diastolic volume 82 ml End systolic volume 39 ml IMPRESSION: 1. Moderate-sized  reversible defect in the anteroseptal wall compatible with ischemia. 2. Normal left ventricular wall motion. 3. Left ventricular ejection fraction 52% 4. Non invasive risk stratification*: Intermediate *2012 Appropriate Use Criteria for Coronary Revascularization Focused Update: J Am Coll Cardiol. 2542;70(6):237-628. http://content.[email or web address]?articleid=[email or web address] Electronically Signed   By: Rolm Baptise M.D.   On: 11/12/2018 15:53    Cardiac Studies   L heart cath   RPDA lesion is 80% stenosed. Small, distal vessel disease.  Previously placed Mid RCA stent (unknown type) is widely patent.  Previously placed Dist Cx stent (unknown type) is widely patent.  Mid Cx lesion is 70% stenosed.  1st Diag lesion is 50% stenosed.  2nd Diag lesion is 80% stenosed.  Mid LAD lesion is 90% stenosed.  Scoring balloon angioplasty was performed using a BALLOON WOLVERINE 2.50X10, used to treat the distal area of restenosis  A drug-eluting stent was successfully placed using a STENT SYNERGY DES 2.5X12, in the proximal area of restenosis which had more recoil.  Post intervention, there is a 0% residual stenosis. Pain was reproduced with balloon inflations.  The left ventricular systolic function is normal.  LV end diastolic pressure is normal.  The left ventricular ejection fraction is 55-65% by visual estimate.  There is no aortic valve stenosis.   Successful PCI of the LAD with cutting balloon and stent.   Stented only the proximal portion of restenosis to avoid long area of 3 stent layers.  Residual small vessel disease in the PDA.  Moderate disease in the distal circumflex.  Continue medical treatment.     Patient Profile     63 y.o. female with a PMH of CAD s/p multiple PCI, HTN, HLD, DM type 2, and OA who is being followed by cardiology for chest pain  Assessment & Plan    1. Chest pain in patient with CAD s/p PCI to all 3 major coronary arteries and restenosis  requiring repeat PCI: patient presented with exertional chest pain which started 11/09/2018 and persisted throughout the weekend prompting her to present to the ED. EKG was non-ischemic. Trops negative at 11 x2.  -Myovue yesterday with anteroseptal ischemia   New    L heart cath as noted above   Pt with 3V CAD   Previously placed stentts patent to RCA and LCx  Pt underwent PTCA/DES to distal area of restenosis in LAD  Plan to continue ASA and PLavix  COnsider Plavix monotherapy after      WIll review in AM   Probable D/C at that time     2. HLD/hypertriglyceridemia: Tcholesterol 143, HDL 15, unable to calculate LDL, Triglycerides 598. On  crestor 20mg  daily. Crestor increased and fenofibrate added  3. HTN: BP overall stable - Continue imdur and metoprolol succinate  4. AKI: Cr elevated to 3.14 on admission,  Cr down to 1.63   Will repeat later this am   Limited contrast when do cath   May need to stage if intervention planned In December Cr was 1.05    PRobable D/C tomorrow    For questions or updates, please contact [facility] Please consult [email or web address] for contact info under Cardiology/STEMI.      Signed, Dorris Carnes, MD

## 2018-11-14 ENCOUNTER — Encounter (HOSPITAL_COMMUNITY): Payer: Self-pay | Admitting: Interventional Cardiology

## 2018-11-14 DIAGNOSIS — I2510 Atherosclerotic heart disease of native coronary artery without angina pectoris: Secondary | ICD-10-CM | POA: Diagnosis present

## 2018-11-14 DIAGNOSIS — I259 Chronic ischemic heart disease, unspecified: Secondary | ICD-10-CM

## 2018-11-14 DIAGNOSIS — Z955 Presence of coronary angioplasty implant and graft: Secondary | ICD-10-CM

## 2018-11-14 LAB — CBC WITH DIFFERENTIAL/PLATELET
Abs Immature Granulocytes: 0.01 10*3/uL (ref 0.00–0.07)
Basophils Absolute: 0 10*3/uL (ref 0.0–0.1)
Basophils Relative: 1 %
Eosinophils Absolute: 0.3 10*3/uL (ref 0.0–0.5)
Eosinophils Relative: 7 %
HCT: 32.7 % — ABNORMAL LOW (ref 36.0–46.0)
Hemoglobin: 10.6 g/dL — ABNORMAL LOW (ref 12.0–15.0)
Immature Granulocytes: 0 %
Lymphocytes Relative: 30 %
Lymphs Abs: 1.1 10*3/uL (ref 0.7–4.0)
MCH: 29.8 pg (ref 26.0–34.0)
MCHC: 32.4 g/dL (ref 30.0–36.0)
MCV: 91.9 fL (ref 80.0–100.0)
Monocytes Absolute: 0.4 10*3/uL (ref 0.1–1.0)
Monocytes Relative: 11 %
Neutro Abs: 1.9 10*3/uL (ref 1.7–7.7)
Neutrophils Relative %: 51 %
Platelets: 173 10*3/uL (ref 150–400)
RBC: 3.56 MIL/uL — ABNORMAL LOW (ref 3.87–5.11)
RDW: 13.1 % (ref 11.5–15.5)
WBC: 3.8 10*3/uL — ABNORMAL LOW (ref 4.0–10.5)
nRBC: 0 % (ref 0.0–0.2)

## 2018-11-14 LAB — BASIC METABOLIC PANEL
Anion gap: 5 (ref 5–15)
BUN: 13 mg/dL (ref 8–23)
CO2: 22 mmol/L (ref 22–32)
Calcium: 8.1 mg/dL — ABNORMAL LOW (ref 8.9–10.3)
Chloride: 111 mmol/L (ref 98–111)
Creatinine, Ser: 1.11 mg/dL — ABNORMAL HIGH (ref 0.44–1.00)
GFR calc Af Amer: >60 mL/min (ref >60)
GFR calc non Af Amer: 54 mL/min — ABNORMAL LOW (ref >60)
Glucose, Bld: 139 mg/dL — ABNORMAL HIGH (ref 70–99)
Potassium: 4.6 mmol/L (ref 3.5–5.1)
Sodium: 138 mmol/L (ref 135–145)

## 2018-11-14 LAB — MAGNESIUM: Magnesium: 1.6 mg/dL — ABNORMAL LOW (ref 1.7–2.4)

## 2018-11-14 LAB — GLUCOSE, CAPILLARY: Glucose-Capillary: 113 mg/dL — ABNORMAL HIGH (ref 70–99)

## 2018-11-14 MED ORDER — MAGNESIUM SULFATE 2 GM/50ML IV SOLN: 2.0000 g | Freq: Once | INTRAVENOUS | Status: DC

## 2018-11-14 MED ORDER — CLOPIDOGREL BISULFATE 75 MG PO TABS: 75.0000 mg | ORAL_TABLET | Freq: Every day | ORAL | 0 refills | Status: DC

## 2018-11-14 NOTE — Plan of Care (Signed)

## 2018-11-14 NOTE — Progress Notes (Signed)
Discharge instructions reviewed and questions answered.  Reviewed scheduled appointments.  Patient via wheelchair to waiting spouses truck in stable condition

## 2018-11-14 NOTE — Progress Notes (Signed)
Progress Note  Patient Name: Haley George Date of Encounter: 11/14/2018  Primary Cardiologist: Sanda Klein, MD   Subjective   Breathing OK   No CP    Inpatient Medications    Scheduled Meds:  aspirin  81 mg Oral Daily   clopidogrel  75 mg Oral Q breakfast   fenofibrate  160 mg Oral Daily   heparin  5,000 Units Subcutaneous Q8H   insulin aspart  0-15 Units Subcutaneous TID WC   insulin aspart  0-5 Units Subcutaneous QHS   insulin glargine  45 Units Subcutaneous QHS   isosorbide mononitrate  30 mg Oral Daily   metoprolol succinate  25 mg Oral Daily   rosuvastatin  40 mg Oral QPM   Semaglutide(0.25 or 0.5MG /DOS)  0.5 mg Subcutaneous Q Mon   sodium chloride flush  3 mL Intravenous Q12H   Continuous Infusions:  sodium chloride     PRN Meds: sodium chloride, acetaminophen, morphine injection, ondansetron (ZOFRAN) IV, sodium chloride flush, zolpidem   Vital Signs    Vitals:   11/13/18 1954 11/13/18 2331 11/14/18 0322 11/14/18 0612  BP: 127/66 118/61 124/60   Pulse: 82     Resp: 15 14 15    Temp: 98.3 F (36.8 C) 97.7 F (36.5 C) 97.7 F (36.5 C)   TempSrc: Oral Oral    SpO2: 99%     Weight:    90.9 kg  Height:        Intake/Output Summary (Last 24 hours) at 11/14/2018 0631 Last data filed at 11/13/2018 1720 Gross per 24 hour  Intake 240 ml  Output --  Net 240 ml   Filed Weights   11/12/18 0046 11/13/18 0308 11/14/18 0612  Weight: 91.7 kg 91.9 kg 90.9 kg    Telemetry    SR  Personally reviewed  Tele:   SR Physical Exam   GEN: No acute distress.   Neck: No JVD, no carotid bruits Cardiac: RRR, no murmurs, rubs, or gallops.  Respiratory: Clear to auscultation bilaterally, no wheezes/ rales/ rhonchi GI: NABS, Soft, nontender, non-distended  MS: No edema; No deformity.  R wrist:   Sl swelling but soft   Bruising   Neuro:  Nonfocal, moving all extremities spontaneously Psych: Normal affect   Labs    Chemistry Recent Labs  Lab  11/12/18 0154 11/13/18 0723 11/14/18 0226  NA 134* 138 138  K 4.7 4.8 4.6  CL 107 111 111  CO2 20* 21* 22  GLUCOSE 215* 157* 139*  BUN 34* 22 13  CREATININE 1.63* 1.19* 1.11*  CALCIUM 8.3* 8.2* 8.1*  PROT 6.2*  --   --   ALBUMIN 3.2*  --   --   AST 37  --   --   ALT 29  --   --   ALKPHOS 27*  --   --   BILITOT 0.7  --   --   GFRNONAA 34* 49* 54*  GFRAA 39* 57* >60  ANIONGAP 7 6 5      Hematology Recent Labs  Lab 11/11/18 0541 11/12/18 0154 11/14/18 0226  WBC 4.4 3.6* 3.8*  RBC 3.68* 3.62* 3.56*  HGB 10.8* 10.8* 10.6*  HCT 34.4* 34.1* 32.7*  MCV 93.5 94.2 91.9  MCH 29.3 29.8 29.8  MCHC 31.4 31.7 32.4  RDW 13.4 13.2 13.1  PLT 198 189 173    Radiology    Nm Myocar Multi W/spect W/wall Motion / Ef  Result Date: 11/12/2018 CLINICAL DATA:  Chest pain EXAM: MYOCARDIAL IMAGING WITH SPECT (  REST AND PHARMACOLOGIC-STRESS) GATED LEFT VENTRICULAR WALL MOTION STUDY LEFT VENTRICULAR EJECTION FRACTION TECHNIQUE: Standard myocardial SPECT imaging was performed after resting intravenous injection of 10 mCi Tc-30m tetrofosmin. Subsequently, intravenous infusion of Lexiscan was performed under the supervision of the Cardiology staff. At peak effect of the drug, 30 mCi Tc-68m tetrofosmin was injected intravenously and standard myocardial SPECT imaging was performed. Quantitative gated imaging was also performed to evaluate left ventricular wall motion, and estimate left ventricular ejection fraction. COMPARISON:  None. FINDINGS: Perfusion: There is a moderate-sized defect on stress imaging within the anteroseptal wall which appears normal on rest images compatible with ischemia. Wall Motion: Normal left ventricular wall motion. No left ventricular dilation. Left Ventricular Ejection Fraction: 52 % End diastolic volume 82 ml End systolic volume 39 ml IMPRESSION: 1. Moderate-sized reversible defect in the anteroseptal wall compatible with ischemia. 2. Normal left ventricular wall motion. 3. Left  ventricular ejection fraction 52% 4. Non invasive risk stratification*: Intermediate *2012 Appropriate Use Criteria for Coronary Revascularization Focused Update: J Am Coll Cardiol. 7829;56(2):130-865. http://[email or web address]?[email or web address] Electronically Signed   By: Rolm Baptise M.D.   On: 11/12/2018 15:53    Cardiac Studies   L heart cath   RPDA lesion is 80% stenosed. Small, distal vessel disease.  Previously placed Mid RCA stent (unknown type) is widely patent.  Previously placed Dist Cx stent (unknown type) is widely patent.  Mid Cx lesion is 70% stenosed.  1st Diag lesion is 50% stenosed.  2nd Diag lesion is 80% stenosed.  Mid LAD lesion is 90% stenosed.  Scoring balloon angioplasty was performed using a BALLOON WOLVERINE 2.50X10, used to treat the distal area of restenosis  A drug-eluting stent was successfully placed using a STENT SYNERGY DES 2.5X12, in the proximal area of restenosis which had more recoil.  Post intervention, there is a 0% residual stenosis. Pain was reproduced with balloon inflations.  The left ventricular systolic function is normal.  LV end diastolic pressure is normal.  The left ventricular ejection fraction is 55-65% by visual estimate.  There is no aortic valve stenosis.   Successful PCI of the LAD with cutting balloon and stent.   Stented only the proximal portion of restenosis to avoid long area of 3 stent layers.  Residual small vessel disease in the PDA.  Moderate disease in the distal circumflex.  Continue medical treatment.     Patient Profile     63 y.o. female with a PMH of CAD s/p multiple PCI, HTN, HLD, DM type 2, and OA who is being followed by cardiology for chest pain  Assessment & Plan    1. Chest pain in patient with CAD s/p PCI to all 3 major coronary arteries and restenosis requiring repeat PCI: patient presented with exertional chest pain which started 11/09/2018 and persisted throughout the  weekend prompting her to present to the ED. EKG was non-ischemic. Trops negative at 11 x2.  -Myovue yesterday with anteroseptal ischemia   New    L heart cath as noted above   Pt with 3V CAD   Previously placed stentts patent to RCA and LCx  Pt underwent PTCA/DES to distal area of restenosis in LAD Recomm continue ASA and PLavix  COnsider Plavix monotherapy after      PT is OK for d/c from cardiac standpoint  WIll make sure that he has f/u in clinic     2. HLD/hypertriglyceridemia: Tcholesterol 143, HDL 15, unable to calculate LDL, Triglycerides 598. On crestor 20mg  daily. Crestor increased and fenofibrate added  3. HTN: BP is OK  - Continue imdur and metoprolol succinate  4. AKI: Cr elevated to 3.14 on admission,  Cr down to 1.1 today      For questions or updates, please contact [place] Please consult [email or web address] for contact info under Cardiology/STEMI.      Signed, Dorris Carnes, MD

## 2018-11-14 NOTE — Discharge Instructions (Signed)
Coronary Angiogram With Stent, Care After °This sheet gives you information about how to care for yourself after your procedure. Your health care provider may also give you more specific instructions. If you have problems or questions, contact your health care provider. °What can I expect after the procedure? °After your procedure, it is common to have: °· Bruising in the area where a small, thin tube (catheter) was inserted. This usually fades within 1-2 weeks. °· Blood collecting in the tissue (hematoma) that may be painful to the touch. It should usually decrease in size and tenderness within 1-2 weeks. °Follow these instructions at home: °Insertion area care °· Do not take baths, swim, or use a hot tub until your health care provider approves. °· You may shower 24-48 hours after the procedure or as directed by your health care provider. °· Follow instructions from your health care provider about how to take care of your incision. Make sure you: °? Wash your hands with soap and water before you change your bandage (dressing). If soap and water are not available, use hand sanitizer. °? Change your dressing as told by your health care provider. °? Leave stitches (sutures), skin glue, or adhesive strips in place. These skin closures may need to stay in place for 2 weeks or longer. If adhesive strip edges start to loosen and curl up, you may trim the loose edges. Do not remove adhesive strips completely unless your health care provider tells you to do that. °· Remove the bandage (dressing) and gently wash the catheter insertion site with plain soap and water. °· Pat the area dry with a clean towel. Do not rub the area, because that may cause bleeding. °· Do not apply powder or lotion to the incision area. °· Check your incision area every day for signs of infection. Check for: °? More redness, swelling, or pain. °? More fluid or blood. °? Warmth. °? Pus or a bad smell. °Activity °· Do not drive for 24 hours if you  were given a medicine to help you relax (sedative). °· Do not lift anything that is heavier than 10 lb (4.5 kg) for 5 days after your procedure or as directed by your health care provider. °· Ask your health care provider when it is okay for you: °? To return to work or school. °? To resume usual physical activities or sports. °? To resume sexual activity. °Eating and drinking ° °· Eat a heart-healthy diet. This should include plenty of fresh fruits and vegetables. °· Avoid the following types of food: °? Food that is high in salt. °? Canned or highly processed food. °? Food that is high in saturated fat or sugar. °? Fried food. °· Limit alcohol intake to no more than 1 drink a day for non-pregnant women and 2 drinks a day for men. One drink equals 12 oz of beer, 5 oz of wine, or 1½ oz of hard liquor. °Lifestyle ° °· Do not use any products that contain nicotine or tobacco, such as cigarettes and e-cigarettes. If you need help quitting, ask your health care provider. °· Take steps to manage and control your weight. °· Get regular exercise. °· Manage your blood pressure. °· Manage other health problems, such as diabetes. °General instructions °· Take over-the-counter and prescription medicines only as told by your health care provider. Blood thinners may be prescribed after your procedure to improve blood flow through the stent. °· If you need an MRI after your heart stent has been placed,   be sure to tell the health care provider who orders the MRI that you have a heart stent. °· Keep all follow-up visits as directed by your health care provider. This is important. °Contact a health care provider if: °· You have a fever. °· You have chills. °· You have increased bleeding from the catheter insertion area. Hold pressure on the area. °Get help right away if: °· You develop chest pain or shortness of breath. °· You feel faint or you pass out. °· You have unusual pain at the catheter insertion area. °· You have redness,  warmth, or swelling at the catheter insertion area. °· You have drainage (other than a small amount of blood on the dressing) from the catheter insertion area. °· The catheter insertion area is bleeding, and the bleeding does not stop after 30 minutes of holding steady pressure on the area. °· You develop bleeding from any other place, such as from your rectum. There may be bright red blood in your urine or stool, or it may appear as black, tarry stool. °This information is not intended to replace advice given to you by your health care provider. Make sure you discuss any questions you have with your health care provider. °Document Released: 10/29/2004 Document Revised: 03/24/2017 Document Reviewed: 01/07/2016 °[employer] Patient Education © 2020 [employer] ° °

## 2018-11-14 NOTE — Progress Notes (Signed)
CARDIAC REHAB PHASE I   PRE:  Rate/Rhythm: 85 SR    BP: sitting 127/56    SaO2:   MODE:  Ambulation: 500 ft   POST:  Rate/Rhythm: 105 ST    BP: sitting 158/78     SaO2:   Tolerated well, no c/o. Ed completed/reviewed. She is working on diet/DM and planning to start exercising now. She needed review of NTG use. Will refer to [place]. She would probably prefer in-person CRPII versus virtual.  Pt is interested in participating in Virtual Cardiac Rehab. Pt advised that Virtual Cardiac Rehab is provided at no cost to the patient. Checklist:  1. Pt has smart device  ie smartphone and/or ipad for downloading an app  Yes 2. Reliable internet/wifi service    Yes 3. Understands how to use their smartphone and navigate within an app.  Yes  Reviewed with pt the scheduling process for virtual cardiac rehab.  Pt verbalized understanding. [place], ACSM 11/14/2018 9:50 AM

## 2018-11-14 NOTE — Discharge Summary (Signed)
Physician Discharge Summary  Haley George XBM:[id number] DOB: 12-Oct-1955 DOA: 11/10/2018  PCP: Denita Lung, MD  Admit date: 11/10/2018 Discharge date: 11/14/2018  Admitted From: Home Disposition: Home  Recommendations for Outpatient Follow-up:  1. Follow up with PCP in 4 to 5 days 2. Follow-up with cardiology in 1 to 2 weeks 3. Please obtain BMP/CBC at next PCPs visit in 4 to 5 days 4. Please follow up on the following pending results:  Home Health: None Equipment/Devices: None  Discharge Condition: Stable CODE STATUS: Code Diet recommendation: Cardiac  Subjective: Patient seen and examined.  She has no complaints.  She states that " I am feeling the best that I have felt in a long time" no chest pain no other complaint.  Brief/Interim Summary: 63 year old female with history of coronary artery disease with stents, diabetes mellitus and osteoarthritis presented with chest pain.  Initial high-sensitivity troponin was normal.  She was found to have creatinine of 3.18, up from 1.05 in December 2019.  She was treated with IV fluids.  Cardiology was consulted. Stress test done on 11/12/2018 showed moderate sized reversible defect in the anteroseptal wall compatible with ischemia she underwent coronary angiography and was placed another DES stent and was started on Plavix and was continued on aspirin.  Patient feels much better today.  Has been seen by cardiology and has been cleared for discharge.  Her renal function has improved significantly and currently it is 1.1.  Patient's blood pressure is fairly stable and normal.  I have advised the patient to check her blood pressure twice daily and as long as it remains less than 027 systolic, she should continue to hold valsartan on until her BMP is checked on her next PCPs visit in next 5 days.  Of note, patient's magnesium is slightly on the low side at 1.6.  She will receive 2 g of IV magnesium sulfate before discharge today.  Discharge  Diagnoses:  Principal Problem:   Chest pain Active Problems:   Hypertension associated with diabetes ([place])   Insulin-requiring or dependent type II diabetes mellitus ([place])   Coronary artery disease of native artery of native heart with stable angina pectoris ([place])   AKI (acute kidney injury) ([place])   Coronary artery stenosis   S/P drug eluting coronary stent placement    Discharge Instructions  Discharge Instructions    Amb Referral to Cardiac Rehabilitation   Complete by: As directed    Diagnosis:  Coronary Stents PTCA     After initial evaluation and assessments completed: Virtual Based Care may be provided alone or in conjunction with Phase 2 Cardiac Rehab based on patient barriers.: Yes   Discharge patient   Complete by: As directed    Discharge disposition: 01-Home or Self Care   Discharge patient date: 11/14/2018     Allergies as of 11/14/2018      Reactions   Penicillins Hives   PATIENT HAD A PCN REACTION WITH IMMEDIATE RASH, FACIAL/TONGUE/THROAT SWELLING, SOB, OR LIGHTHEADEDNESS WITH HYPOTENSION:  #  #  #  YES  #  #  #   Has patient had a PCN reaction causing severe rash involving mucus membranes or skin necrosis:Unknown Has patient had a PCN reaction that required hospitalization:No Has patient had a PCN reaction occurring within the last 10 years:No If all of the above answers are "NO", then may proceed with Cephalosporin use.      Medication List    TAKE these medications   aspirin EC 81 MG tablet  Take 81 mg by mouth at bedtime.   BD Pen Needle Nano U/F 32G X 4 MM Misc Generic drug: Insulin Pen Needle Use as directed for insulin administration.   clopidogrel 75 MG tablet Commonly known as: PLAVIX Take 1 tablet (75 mg total) by mouth daily with breakfast. Start taking on: November 15, 2018   Coenzyme Q10 300 MG Caps Take 300 mg by mouth daily.   ezetimibe 10 MG tablet Commonly known as: ZETIA Take 10 mg by mouth every evening.   fenofibrate 145 MG  tablet Commonly known as: TRICOR TAKE 1 TABLET BY MOUTH EVERY DAY What changed: when to take this   glucose blood test strip Pt uses relion meter- test bid for glucose montioring E11.9 Please dispense relion test strips.   Icosapent Ethyl 1 g Caps Commonly known as: Vascepa Take 2 capsules (2 g total) by mouth 2 (two) times daily.   isosorbide mononitrate 30 MG 24 hr tablet Commonly known as: IMDUR Take 1 tablet (30 mg total) by mouth daily. ** DO NOT CRUSH **   L-Lysine 1000 MG Tabs Take 1 tablet by mouth daily.   Levemir FlexTouch 100 UNIT/ML Pen Generic drug: Insulin Detemir INJECT 120 UNITS INTO THE SKIN DAILY. What changed:   how much to take  how to take this  when to take this  additional instructions   metFORMIN 1000 MG tablet Commonly known as: GLUCOPHAGE Take 1 tablet (1,000 mg total) by mouth 2 (two) times daily with a meal.   metoprolol succinate 25 MG 24 hr tablet Commonly known as: TOPROL-XL TAKE 1.5 TABLETS (37.5 MG TOTAL) BY MOUTH DAILY. What changed: how much to take   MSM PO Take 6,000 mg by mouth daily.   multivitamin with minerals Tabs tablet Take 1 tablet by mouth daily.   Nitrostat 0.4 MG SL tablet Generic drug: nitroGLYCERIN DISSOLVE 1 TAB UNDER THE TONGUE EVERY 5 MIN AS NEEDED FOR CHEST PAIN (MAX OF 3) What changed: See the new instructions.   Ozempic (0.25 or 0.5 MG/DOSE) 2 MG/1.5ML Sopn Generic drug: Semaglutide(0.25 or 0.5MG /DOS) Inject 0.5 mg into the skin once a week. What changed: when to take this   ReliOn Lancets Thin 26G Misc 1 Units by Does not apply route 2 (two) times daily. E 11.9   rosuvastatin 20 MG tablet Commonly known as: CRESTOR TAKE 1 TABLET BY MOUTH EVERY DAY What changed: when to take this   SM Cranberry 300 MG tablet Generic drug: Cranberry Take 300 mg by mouth every evening.   valsartan 160 MG tablet Commonly known as: DIOVAN Take 1 tablet (160 mg total) by mouth daily. What changed: how much to  take   VITAMIN C PO Take 5,000 mg by mouth daily.      Follow-up Information    Ledora Bottcher, PA Follow up on 11/22/2018.   Specialties: Physician Assistant, Cardiology, Radiology Why: 10:45 AM for [facility] Contact information: [address] [place] [address] [phone]        Denita Lung, MD Follow up in 5 day(s).   Specialty: Family Medicine Contact information: [place] [address] [phone]        Sanda Klein, MD .   Specialty: Cardiology Contact information: [address] [place] [address] [place] [address] [phone]          Allergies  Allergen Reactions  . Penicillins Hives     PATIENT HAD A PCN REACTION WITH IMMEDIATE RASH, FACIAL/TONGUE/THROAT SWELLING, SOB, OR LIGHTHEADEDNESS  WITH HYPOTENSION:  #  #  #  YES  #  #  #   Has patient had a PCN reaction causing severe rash involving mucus membranes or skin necrosis:Unknown Has patient had a PCN reaction that required hospitalization:No Has patient had a PCN reaction occurring within the last 10 years:No If all of the above answers are "NO", then may proceed with Cephalosporin use.     Consultations: Cardiology   Procedures/Studies: Dg Chest 2 View  Result Date: 11/10/2018 CLINICAL DATA:  Chest pain and shortness of breath. EXAM: CHEST - 2 VIEW COMPARISON:  02/27/2017 FINDINGS: The cardiomediastinal contours are normal. Coronary stent visualized. The lungs are clear. Pulmonary vasculature is normal. No consolidation, pleural effusion, or pneumothorax. No acute osseous abnormalities are seen. IMPRESSION: No acute chest findings. Electronically Signed   By: Keith Rake M.D.   On: 11/10/2018 22:03   Nm Myocar Multi W/spect W/wall Motion / Ef  Result Date: 11/12/2018 CLINICAL DATA:  Chest pain EXAM: MYOCARDIAL IMAGING WITH SPECT (REST AND PHARMACOLOGIC-STRESS) GATED LEFT VENTRICULAR WALL MOTION STUDY LEFT VENTRICULAR EJECTION FRACTION  TECHNIQUE: Standard myocardial SPECT imaging was performed after resting intravenous injection of 10 mCi Tc-41m tetrofosmin. Subsequently, intravenous infusion of Lexiscan was performed under the supervision of the Cardiology staff. At peak effect of the drug, 30 mCi Tc-54m tetrofosmin was injected intravenously and standard myocardial SPECT imaging was performed. Quantitative gated imaging was also performed to evaluate left ventricular wall motion, and estimate left ventricular ejection fraction. COMPARISON:  None. FINDINGS: Perfusion: There is a moderate-sized defect on stress imaging within the anteroseptal wall which appears normal on rest images compatible with ischemia. Wall Motion: Normal left ventricular wall motion. No left ventricular dilation. Left Ventricular Ejection Fraction: 52 % End diastolic volume 82 ml End systolic volume 39 ml IMPRESSION: 1. Moderate-sized reversible defect in the anteroseptal wall compatible with ischemia. 2. Normal left ventricular wall motion. 3. Left ventricular ejection fraction 52% 4. Non invasive risk stratification*: Intermediate *2012 Appropriate Use Criteria for Coronary Revascularization Focused Update: J Am Coll Cardiol. 3710;62(6):948-546. http://content.airportbarriers.com.aspx?articleid=[email or web address] Electronically Signed   By: Rolm Baptise M.D.   On: 11/12/2018 15:53     Discharge Exam: Vitals:   11/14/18 0322 11/14/18 0733  BP: 124/60 (!) 148/60  Pulse:  85  Resp: 15 18  Temp: 97.7 F (36.5 C) 98.1 F (36.7 C)  SpO2:  98%   Vitals:   11/13/18 2331 11/14/18 0322 11/14/18 0612 11/14/18 0733  BP: 118/61 124/60  (!) 148/60  Pulse:    85  Resp: 14 15  18   Temp: 97.7 F (36.5 C) 97.7 F (36.5 C)  98.1 F (36.7 C)  TempSrc: Oral   Oral  SpO2:    98%  Weight:   90.9 kg   Height:        General: Pt is alert, awake, not in acute distress Cardiovascular: RRR, S1/S2 +, no rubs, no gallops Respiratory: CTA bilaterally, no wheezing, no  rhonchi Abdominal: Soft, NT, ND, bowel sounds + Extremities: no edema, no cyanosis    The results of significant diagnostics from this hospitalization (including imaging, microbiology, ancillary and laboratory) are listed below for reference.     Microbiology: Recent Results (from the past 240 hour(s))  SARS Coronavirus 2 (CEPHEID - Performed in [facility] hospital lab), Hosp Order     Status: None   Collection Time: 11/11/18  1:53 AM   Specimen: Nasopharyngeal Swab  Result Value Ref Range Status   SARS Coronavirus 2 NEGATIVE NEGATIVE  Final    Comment: (NOTE) If result is NEGATIVE SARS-CoV-2 target nucleic acids are NOT DETECTED. The SARS-CoV-2 RNA is generally detectable in upper and lower  respiratory specimens during the acute phase of infection. The lowest  concentration of SARS-CoV-2 viral copies this assay can detect is 250  copies / mL. A negative result does not preclude SARS-CoV-2 infection  and should not be used as the sole basis for treatment or other  patient management decisions.  A negative result may occur with  improper specimen collection / handling, submission of specimen other  than nasopharyngeal swab, presence of viral mutation(s) within the  areas targeted by this assay, and inadequate number of viral copies  (<250 copies / mL). A negative result must be combined with clinical  observations, patient history, and epidemiological information. If result is POSITIVE SARS-CoV-2 target nucleic acids are DETECTED. The SARS-CoV-2 RNA is generally detectable in upper and lower  respiratory specimens dur ing the acute phase of infection.  Positive  results are indicative of active infection with SARS-CoV-2.  Clinical  correlation with patient history and other diagnostic information is  necessary to determine patient infection status.  Positive results do  not rule out bacterial infection or co-infection with other viruses. If result is PRESUMPTIVE  POSTIVE SARS-CoV-2 nucleic acids MAY BE PRESENT.   A presumptive positive result was obtained on the submitted specimen  and confirmed on repeat testing.  While 2019 novel coronavirus  (SARS-CoV-2) nucleic acids may be present in the submitted sample  additional confirmatory testing may be necessary for epidemiological  and / or clinical management purposes  to differentiate between  SARS-CoV-2 and other Sarbecovirus currently known to infect humans.  If clinically indicated additional testing with an alternate test  methodology [phone]) is advised. The SARS-CoV-2 RNA is generally  detectable in upper and lower respiratory sp ecimens during the acute  phase of infection. The expected result is Negative. Fact Sheet for Patients:  StrictlyIdeas.no Fact Sheet for Healthcare Providers: BankingDealers.co.za This test is not yet approved or cleared by the [place] FDA and has been authorized for detection and/or diagnosis of SARS-CoV-2 by FDA under an Emergency Use Authorization (EUA).  This EUA will remain in effect (meaning this test can be used) for the duration of the COVID-19 declaration under Section 564(b)(1) of the Act, 21 U.S.C. section 360bbb-3(b)(1), unless the authorization is terminated or revoked sooner. Performed at [facility], [place] [address], [place], [place] [address]   MRSA PCR Screening     Status: None   Collection Time: 11/11/18  3:58 AM   Specimen: Nasopharyngeal  Result Value Ref Range Status   MRSA by PCR NEGATIVE NEGATIVE Final    Comment:        The GeneXpert MRSA Assay (FDA approved for NASAL specimens only), is one component of a comprehensive MRSA colonization surveillance program. It is not intended to diagnose MRSA infection nor to guide or monitor treatment for MRSA infections. Performed at [facility], [place] [address], [place], [place] [address]      Labs: BNP (last 3  results) No results for input(s): BNP in the last 8760 hours. Basic Metabolic Panel: Recent Labs  Lab 11/10/18 2118 11/11/18 0541 11/12/18 0154 11/13/18 0723 11/14/18 0226  NA 135 137 134* 138 138  K 4.9 4.5 4.7 4.8 4.6  CL 103 107 107 111 111  CO2 21* 21* 20* 21* 22  GLUCOSE 118* 201* 215* 157* 139*  BUN 40* 40*  34* 22 13  CREATININE 3.14* 2.69* 1.63* 1.19* 1.11*  CALCIUM 10.8* 9.4 8.3* 8.2* 8.1*  MG  --   --  1.7  --  1.6*   Liver Function Tests: Recent Labs  Lab 11/12/18 0154  AST 37  ALT 29  ALKPHOS 27*  BILITOT 0.7  PROT 6.2*  ALBUMIN 3.2*   No results for input(s): LIPASE, AMYLASE in the last 168 hours. No results for input(s): AMMONIA in the last 168 hours. CBC: Recent Labs  Lab 11/10/18 2118 11/11/18 0541 11/12/18 0154 11/14/18 0226  WBC 6.0 4.4 3.6* 3.8*  NEUTROABS  --   --  1.7 1.9  HGB 12.5 10.8* 10.8* 10.6*  HCT 39.7 34.4* 34.1* 32.7*  MCV 92.3 93.5 94.2 91.9  PLT 268 198 189 173   Cardiac Enzymes: No results for input(s): CKTOTAL, CKMB, CKMBINDEX, TROPONINI in the last 168 hours. BNP: Invalid input(s): POCBNP CBG: Recent Labs  Lab 11/13/18 1048 11/13/18 1335 11/13/18 1628 11/13/18 2131 11/14/18 0609  GLUCAP 142* 107* 169* 190* 113*   D-Dimer No results for input(s): DDIMER in the last 72 hours. Hgb A1c No results for input(s): HGBA1C in the last 72 hours. Lipid Profile No results for input(s): CHOL, HDL, LDLCALC, TRIG, CHOLHDL, LDLDIRECT in the last 72 hours. Thyroid function studies No results for input(s): TSH, T4TOTAL, T3FREE, THYROIDAB in the last 72 hours.  Invalid input(s): FREET3 Anemia work up No results for input(s): VITAMINB12, FOLATE, FERRITIN, TIBC, IRON, RETICCTPCT in the last 72 hours. Urinalysis    Component Value Date/Time   COLORURINE YELLOW 11/11/2018 0356   APPEARANCEUR CLEAR 11/11/2018 0356   LABSPEC 1.012 11/11/2018 0356   LABSPEC 1.020 04/09/2018 1113   PHURINE 5.0 11/11/2018 0356   GLUCOSEU NEGATIVE  11/11/2018 0356   HGBUR NEGATIVE 11/11/2018 0356   BILIRUBINUR NEGATIVE 11/11/2018 0356   BILIRUBINUR negative 04/09/2018 1113   BILIRUBINUR n 08/29/2016 0900   BILIRUBINUR Negative 04/17/2008 1036   KETONESUR NEGATIVE 11/11/2018 0356   PROTEINUR NEGATIVE 11/11/2018 0356   UROBILINOGEN negative 07/12/2016 1521   UROBILINOGEN >8.0 (H) 01/03/2012 0726   NITRITE NEGATIVE 11/11/2018 0356   LEUKOCYTESUR NEGATIVE 11/11/2018 0356   LEUKOCYTESUR Negative 04/17/2008 1036   Sepsis Labs Invalid input(s): PROCALCITONIN,  WBC,  LACTICIDVEN Microbiology Recent Results (from the past 240 hour(s))  SARS Coronavirus 2 (CEPHEID - Performed in [facility] hospital lab), Hosp Order     Status: None   Collection Time: 11/11/18  1:53 AM   Specimen: Nasopharyngeal Swab  Result Value Ref Range Status   SARS Coronavirus 2 NEGATIVE NEGATIVE Final    Comment: (NOTE) If result is NEGATIVE SARS-CoV-2 target nucleic acids are NOT DETECTED. The SARS-CoV-2 RNA is generally detectable in upper and lower  respiratory specimens during the acute phase of infection. The lowest  concentration of SARS-CoV-2 viral copies this assay can detect is 250  copies / mL. A negative result does not preclude SARS-CoV-2 infection  and should not be used as the sole basis for treatment or other  patient management decisions.  A negative result may occur with  improper specimen collection / handling, submission of specimen other  than nasopharyngeal swab, presence of viral mutation(s) within the  areas targeted by this assay, and inadequate number of viral copies  (<250 copies / mL). A negative result must be combined with clinical  observations, patient history, and epidemiological information. If result is POSITIVE SARS-CoV-2 target nucleic acids are DETECTED. The SARS-CoV-2 RNA is generally detectable in upper and lower  respiratory specimens dur ing the acute phase of infection.  Positive  results are indicative of active  infection with SARS-CoV-2.  Clinical  correlation with patient history and other diagnostic information is  necessary to determine patient infection status.  Positive results do  not rule out bacterial infection or co-infection with other viruses. If result is PRESUMPTIVE POSTIVE SARS-CoV-2 nucleic acids MAY BE PRESENT.   A presumptive positive result was obtained on the submitted specimen  and confirmed on repeat testing.  While 2019 novel coronavirus  (SARS-CoV-2) nucleic acids may be present in the submitted sample  additional confirmatory testing may be necessary for epidemiological  and / or clinical management purposes  to differentiate between  SARS-CoV-2 and other Sarbecovirus currently known to infect humans.  If clinically indicated additional testing with an alternate test  methodology [phone]) is advised. The SARS-CoV-2 RNA is generally  detectable in upper and lower respiratory sp ecimens during the acute  phase of infection. The expected result is Negative. Fact Sheet for Patients:  StrictlyIdeas.no Fact Sheet for Healthcare Providers: BankingDealers.co.za This test is not yet approved or cleared by the [place] FDA and has been authorized for detection and/or diagnosis of SARS-CoV-2 by FDA under an Emergency Use Authorization (EUA).  This EUA will remain in effect (meaning this test can be used) for the duration of the COVID-19 declaration under Section 564(b)(1) of the Act, 21 U.S.C. section 360bbb-3(b)(1), unless the authorization is terminated or revoked sooner. Performed at [facility], [place] [address], [place], [place] [address]   MRSA PCR Screening     Status: None   Collection Time: 11/11/18  3:58 AM   Specimen: Nasopharyngeal  Result Value Ref Range Status   MRSA by PCR NEGATIVE NEGATIVE Final    Comment:        The GeneXpert MRSA Assay (FDA approved for NASAL specimens only), is one component of  a comprehensive MRSA colonization surveillance program. It is not intended to diagnose MRSA infection nor to guide or monitor treatment for MRSA infections. Performed at [facility], [place] [address] [place], [place] [address]      Time coordinating discharge: Over 30 minutes  SIGNED:   Darliss Cheney, MD  [facility] 11/14/2018, 10:07 AM Pager [phone]  If 7PM-7AM, please contact night-coverage [email or web address] Password [email or web address]

## 2018-11-15 ENCOUNTER — Other Ambulatory Visit: Payer: Self-pay | Admitting: Internal Medicine

## 2018-11-15 MED ORDER — NITROGLYCERIN 0.4 MG SL SUBL: SUBLINGUAL_TABLET | SUBLINGUAL | 0 refills | Status: DC

## 2018-11-15 NOTE — Telephone Encounter (Signed)
Called pt to do hospital follow-up.   Pt is currently not taking vitamin c, l-lysine, msm and valsartan.    Pt does need a refill on nitrostat to [facility]

## 2018-11-19 ENCOUNTER — Encounter: Payer: Self-pay | Admitting: Family Medicine

## 2018-11-19 ENCOUNTER — Other Ambulatory Visit: Payer: Self-pay

## 2018-11-19 ENCOUNTER — Ambulatory Visit (INDEPENDENT_AMBULATORY_CARE_PROVIDER_SITE_OTHER): Payer: BC Managed Care – PPO | Admitting: Family Medicine

## 2018-11-19 VITALS — BP 148/86 | HR 88 | Temp 98.5°F | Wt 199.4 lb

## 2018-11-19 DIAGNOSIS — I2510 Atherosclerotic heart disease of native coronary artery without angina pectoris: Secondary | ICD-10-CM

## 2018-11-19 DIAGNOSIS — E118 Type 2 diabetes mellitus with unspecified complications: Secondary | ICD-10-CM | POA: Diagnosis not present

## 2018-11-19 DIAGNOSIS — N179 Acute kidney failure, unspecified: Secondary | ICD-10-CM | POA: Diagnosis not present

## 2018-11-19 NOTE — Patient Instructions (Signed)
Heat for 20 minutes 3 times per day.

## 2018-11-19 NOTE — Progress Notes (Signed)
   Subjective:    Patient ID: Haley George, female    DOB: 03-02-1956, 63 y.o.   MRN: [id number]  HPI She is here for a posthospitalization visit.  The hospital record including H&P, discharge summary, blood work was reviewed.  She did have evidence of an acute kidney injury and also difficulty with cardiac issues necessitating catheterization.  She was restented.  Also she was rehydrated.  She was found to have low magnesium levels.  She also had extravasation of the blood into her right forearm causing some discomfort.  She has been splinting the area.  It does cause some slight discomfort.  Since being home she is doing fairly well.  She has no chest pain, shortness of breath, PND or DOE.  She is now also taking Plavix.  She follows up with endocrine for her underlying diabetes.   Review of Systems     Objective:   Physical Exam Alert and in no distress.  Cardiac exam shows regular rhythm without murmurs or gallops.  Lungs are clear to auscultation.  Lower extremity exam shows a negative Homans sign.       Assessment & Plan:  Diabetes mellitus with complication ([place]) - Plan: She will continue to be followed by Dr. Cruzita Lederer  ASHD (arteriosclerotic heart disease) - Plan: She does have a follow-up appointment with Dr. Loletha Grayer in October  Hypomagnesemia - Plan: Magnesium,   Acute kidney injury ([place]) - Plan: Comprehensive metabolic panel, will wait her renal function testing and probably place her back on her losartan at that time. Follow-up here in roughly 6 months. Approximately 1/2hour spent discussing all of these issues with her

## 2018-11-20 LAB — COMPREHENSIVE METABOLIC PANEL
ALT: 30 IU/L (ref 0–32)
AST: 34 IU/L (ref 0–40)
Albumin/Globulin Ratio: 1.6 (ref 1.2–2.2)
Albumin: 4.5 g/dL (ref 3.8–4.8)
Alkaline Phosphatase: 39 IU/L (ref 39–117)
BUN/Creatinine Ratio: 18 (ref 12–28)
BUN: 21 mg/dL (ref 8–27)
Bilirubin Total: 0.3 mg/dL (ref 0.0–1.2)
CO2: 20 mmol/L (ref 20–29)
Calcium: 10.5 mg/dL — ABNORMAL HIGH (ref 8.7–10.3)
Chloride: 101 mmol/L (ref 96–106)
Creatinine, Ser: 1.17 mg/dL — ABNORMAL HIGH (ref 0.57–1.00)
GFR calc Af Amer: 58 mL/min/{1.73_m2} — ABNORMAL LOW (ref >59)
GFR calc non Af Amer: 50 mL/min/{1.73_m2} — ABNORMAL LOW (ref >59)
Globulin, Total: 2.9 g/dL (ref 1.5–4.5)
Glucose: 142 mg/dL — ABNORMAL HIGH (ref 65–99)
Potassium: 5 mmol/L (ref 3.5–5.2)
Sodium: 139 mmol/L (ref 134–144)
Total Protein: 7.4 g/dL (ref 6.0–8.5)

## 2018-11-20 LAB — MAGNESIUM: Magnesium: 1.7 mg/dL (ref 1.6–2.3)

## 2018-11-21 ENCOUNTER — Telehealth (HOSPITAL_COMMUNITY): Payer: Self-pay

## 2018-11-21 NOTE — Addendum Note (Signed)
Addended by: Denita Lung on: 11/21/2018 12:25 PM   Modules accepted: Level of Service

## 2018-11-21 NOTE — Telephone Encounter (Signed)
Pt is not interested in the in house cardiac rehab but she is interested in the virtual she currently has a follow up appt in Oct but she stated she is currently on a waiting list to see her cardiologist and will call back if they have something sooner so she can schedule her virtual appt.

## 2018-11-22 ENCOUNTER — Ambulatory Visit: Payer: BC Managed Care – PPO | Admitting: Physician Assistant

## 2018-11-27 ENCOUNTER — Other Ambulatory Visit: Payer: Self-pay

## 2018-11-27 ENCOUNTER — Other Ambulatory Visit: Payer: BC Managed Care – PPO

## 2018-11-27 DIAGNOSIS — E118 Type 2 diabetes mellitus with unspecified complications: Secondary | ICD-10-CM

## 2018-11-27 DIAGNOSIS — N179 Acute kidney failure, unspecified: Secondary | ICD-10-CM

## 2018-11-28 ENCOUNTER — Ambulatory Visit (INDEPENDENT_AMBULATORY_CARE_PROVIDER_SITE_OTHER): Payer: BC Managed Care – PPO | Admitting: Family Medicine

## 2018-11-28 ENCOUNTER — Encounter: Payer: Self-pay | Admitting: Family Medicine

## 2018-11-28 VITALS — BP 158/88 | HR 104 | Temp 98.7°F | Ht 65.0 in | Wt 198.0 lb

## 2018-11-28 DIAGNOSIS — N3000 Acute cystitis without hematuria: Secondary | ICD-10-CM | POA: Diagnosis not present

## 2018-11-28 DIAGNOSIS — R300 Dysuria: Secondary | ICD-10-CM

## 2018-11-28 LAB — RENAL FUNCTION PANEL
Albumin: 4.5 g/dL (ref 3.8–4.8)
BUN/Creatinine Ratio: 18 (ref 12–28)
BUN: 20 mg/dL (ref 8–27)
CO2: 24 mmol/L (ref 20–29)
Calcium: 10.8 mg/dL — ABNORMAL HIGH (ref 8.7–10.3)
Chloride: 98 mmol/L (ref 96–106)
Creatinine, Ser: 1.12 mg/dL — ABNORMAL HIGH (ref 0.57–1.00)
GFR calc Af Amer: 61 mL/min/{1.73_m2} (ref >59)
GFR calc non Af Amer: 53 mL/min/{1.73_m2} — ABNORMAL LOW (ref >59)
Glucose: 155 mg/dL — ABNORMAL HIGH (ref 65–99)
Phosphorus: 3.9 mg/dL (ref 3.0–4.3)
Potassium: 5.2 mmol/L (ref 3.5–5.2)
Sodium: 139 mmol/L (ref 134–144)

## 2018-11-28 LAB — POCT URINALYSIS DIP (PROADVANTAGE DEVICE)
Bilirubin, UA: NEGATIVE
Glucose, UA: NEGATIVE mg/dL
Nitrite, UA: NEGATIVE
Protein Ur, POC: 30 mg/dL — AB
Specific Gravity, Urine: 1.03
Urobilinogen, Ur: NEGATIVE
pH, UA: 5.5 (ref 5.0–8.0)

## 2018-11-28 MED ORDER — SULFAMETHOXAZOLE-TRIMETHOPRIM 800-160 MG PO TABS: 1.0000 | ORAL_TABLET | Freq: Two times a day (BID) | ORAL | 0 refills | Status: DC

## 2018-11-28 NOTE — Progress Notes (Signed)
Chief Complaint  Patient presents with  . Urinary Frequency    burning and stinging    Subjective:  Haley George is a 63 y.o. female who complains of possible urinary tract infection.  She has had symptoms for 1 day.  Started this morning. Symptoms include burning with urination, urinary frequency and cloudy urine. Patient denies back pain, fever, stomach ache and vaginal discharge.  Last UTI was April 2020.   Using nothing for current symptoms.    Patient does not have a history of recurrent UTI. Patient does not have a history of pyelonephritis.  No other aggravating or relieving factors.  No other c/o.  Past Medical History:  Diagnosis Date  . Asthma   . Cancer ([place])    basal cell chest  . Complication of anesthesia   . Coronary artery disease   . Depression   . Diabetes mellitus without complication ([place])    type 2  . Heart murmur   . Hepatitis    auto- immune  . History of kidney stones   . History of nuclear stress test 05/21/2010   dipyridamole; normal perfusion, preserved LV systolic EF of 53%  . Hyperlipidemia   . Hypertension   . Liver failure ([place])    h/o autoimmune hepatitis   . Myocardial infarction ([place])   . Neuropathy    tingling and burning left leg to foot  . Obesity   . PONV (postoperative nausea and vomiting)   . Psoriasis   . Tubular adenoma of colon    colon path 01/09/2017    ROS as in subjective  Reviewed allergies, medications, past medical, surgical, and social history.    Objective: Vitals:   11/28/18 1342  BP: (!) 158/88  Pulse: (!) 104  Temp: 98.7 F (37.1 C)  SpO2: 97%    General appearance: alert, no distress, WD/WN, female Abdomen: +bs, soft, non tender, non distended, no masses, no hepatomegaly, no splenomegaly, no bruits Back: no CVA tenderness GU: declines      Laboratory:  Urine dipstick: 3+ for leukocyte esterase.      Assessment: Acute cystitis without hematuria - Plan: sulfamethoxazole-trimethoprim (BACTRIM DS)  800-160 MG tablet, Urine Culture. Treat for UTI. Increase fluids. Follow up pending culture and if symptoms are worsening or not improving.   Dysuria - Plan: Urine Culture    Plan: Discussed symptoms, diagnosis, possible complications, and usual course of illness.  Begin antibiotic.  Advised increased water intake, can use OTC Tylenol for pain.       Urine culture sent.   Call or return if worse or not improving.

## 2018-11-30 LAB — URINE CULTURE

## 2018-12-11 ENCOUNTER — Other Ambulatory Visit: Payer: Self-pay | Admitting: Family Medicine

## 2018-12-11 ENCOUNTER — Telehealth: Payer: Self-pay | Admitting: Cardiovascular Disease

## 2018-12-11 NOTE — Telephone Encounter (Signed)
New Message    *STAT* If patient is at the pharmacy, call can be transferred to refill team.   1. Which medications need to be refilled? (please list name of each medication and dose if known) clopidogrel (PLAVIX) 75 MG tablet   2. Which pharmacy/location (including street and city if local pharmacy) is medication to be sent to? [id number] [place], [place]  3. Do they need a 30 day or 90 day supply? [place]

## 2018-12-11 NOTE — Telephone Encounter (Signed)
Left message for pt to call.

## 2018-12-11 NOTE — Telephone Encounter (Signed)
The patient called asking if she was supposed to be on Plavix for 6-12 months or for 30 days. Her prescription was written for 30 days. She will need a refill.Marland Kitchen  She was also instructed to hold Valsartan until she completed her BMET to check her kidney function. Renal function panel lab was completed on 8/04. She was never told if she was safe to resume the Valsartan. She stated that her blood pressures were on the high level of normal but was unable to give any readings.  Per Discharge: Patient's blood pressure is fairly stable and normal.  I have advised the patient to check her blood pressure twice daily and as long as it remains less than 174 systolic, she should continue to hold valsartan on until her BMP is checked on her next PCPs visit in next 5 days.

## 2018-12-11 NOTE — Telephone Encounter (Signed)
New Message   Pt c/o medication issue:  1. Name of Medication: valsartan (DIOVAN) 160 MG tablet   2. How are you currently taking this medication (dosage and times per day)?   3. Are you having a reaction (difficulty breathing--STAT)?   4. What is your medication issue? Patient is wanting to know can she resume taking her valsartan

## 2018-12-12 NOTE — Telephone Encounter (Signed)
Please review for refill. Prescribed by hospitalist during 11/10/2018 hospitalization. Thank you!

## 2018-12-12 NOTE — Telephone Encounter (Signed)
Ok to resume Valsartan - continue Plavix 75 mg daily for at least 6 months.  Dr H (for Dr. Loletha Grayer)

## 2018-12-12 NOTE — Telephone Encounter (Signed)
Duplicate note. Encounter has been sent to provider for recommendation.

## 2018-12-12 NOTE — Telephone Encounter (Signed)
Left a message for the patient to call back.  

## 2018-12-13 MED ORDER — CLOPIDOGREL BISULFATE 75 MG PO TABS: 75.0000 mg | ORAL_TABLET | Freq: Every day | ORAL | 1 refills | Status: DC

## 2018-12-13 NOTE — Telephone Encounter (Signed)
Follow Up:     Returning Lisa's call from today.

## 2018-12-13 NOTE — Telephone Encounter (Signed)
Spoke with pt, aware of dr hilty's recommendations and refill for plavix sent to the pharmacy.

## 2018-12-19 ENCOUNTER — Ambulatory Visit: Payer: BC Managed Care – PPO | Admitting: Cardiovascular Disease

## 2018-12-19 ENCOUNTER — Other Ambulatory Visit: Payer: Self-pay

## 2018-12-19 ENCOUNTER — Encounter: Payer: Self-pay | Admitting: Cardiovascular Disease

## 2018-12-19 VITALS — BP 128/58 | HR 92 | Temp 97.9°F | Ht 65.0 in | Wt 195.0 lb

## 2018-12-19 DIAGNOSIS — E669 Obesity, unspecified: Secondary | ICD-10-CM

## 2018-12-19 DIAGNOSIS — I350 Nonrheumatic aortic (valve) stenosis: Secondary | ICD-10-CM

## 2018-12-19 DIAGNOSIS — J4530 Mild persistent asthma, uncomplicated: Secondary | ICD-10-CM

## 2018-12-19 DIAGNOSIS — E1169 Type 2 diabetes mellitus with other specified complication: Secondary | ICD-10-CM

## 2018-12-19 DIAGNOSIS — E782 Mixed hyperlipidemia: Secondary | ICD-10-CM | POA: Diagnosis not present

## 2018-12-19 DIAGNOSIS — Z2989 Encounter for other specified prophylactic measures: Secondary | ICD-10-CM

## 2018-12-19 DIAGNOSIS — I10 Essential (primary) hypertension: Secondary | ICD-10-CM

## 2018-12-19 DIAGNOSIS — I2510 Atherosclerotic heart disease of native coronary artery without angina pectoris: Secondary | ICD-10-CM

## 2018-12-19 DIAGNOSIS — Z298 Encounter for other specified prophylactic measures: Secondary | ICD-10-CM

## 2018-12-19 MED ORDER — ACETAZOLAMIDE 250 MG PO TABS: 250.0000 mg | ORAL_TABLET | Freq: Every day | ORAL | 0 refills | Status: DC

## 2018-12-19 NOTE — Patient Instructions (Addendum)
Medication Instructions:  STOP the Ezetimibe (Zetia) START Diamox 250 once daily for 10 days  Dr. Sallyanne Kuster recommends Repatha 140 mg every 14 days (PCSK9). This is an injectable cholesterol medication. This medication will need prior approval with your insurance company, which we will work on. If the medication is not approved initially, we may need to do an appeal with your insurance. We will keep you updated on this process. This medication can be provided at some local pharmacies or be shipped to you from a specialty pharmacy.    If you need a refill on your cardiac medications before your next appointment, please call your pharmacy.   Lab work: Your provider would like for you to return in 2 months to have the following labs drawn: CMET and FASTING Lipid. You do not need an appointment for the lab. Once in our office lobby there is a podium where you can sign in and ring the doorbell to alert [place] that you are here. The lab is open from 8:00 am to 4:30 pm; closed for lunch from 12:45pm-1:45pm.  If you have labs (blood work) drawn today and your tests are completely normal, you will receive your results only by: Marland Kitchen MyChart Message (if you have MyChart) OR . A paper copy in the mail If you have any lab test that is abnormal or we need to change your treatment, we will call you to review the results.  Testing/Procedures: None ordered  Follow-Up: At [facility], you and your health needs are our priority.  As part of our continuing mission to provide you with exceptional heart care, we have created designated Provider Care Teams.  These Care Teams include your primary Cardiologist (physician) and Advanced Practice Providers (APPs -  Physician Assistants and Nurse Practitioners) who all work together to provide you with the care you need, when you need it. You will need a follow up appointment in 3 months.  Please call our office 2 months in advance to schedule this appointment.  You may see  Sanda Klein, MD or one of the following Advanced Practice Providers on your designated Care Team: [place], [place] . Fabian Sharp, PA-C

## 2018-12-19 NOTE — Progress Notes (Signed)
Patient ID: Haley George, female   DOB: [birth year], 63 y.o.   MRN: [id number]    Cardiology Office Note    Date:  12/21/2018   ID:  Haley George, DOB 19-Mar-1956, MRN [id number]  PCP:  Denita Lung, MD  Cardiologist:   Sanda Klein, MD   Chief Complaint  Patient presents with  . Coronary Artery Disease  . Hyperlipidemia    History of Present Illness:  Haley George is a 63 y.o. female with previous percutaneous revascularization procedures performed in all 3 major coronary arteries and restenosis requiring repeat PCI.   Just a few days after her appointment with me earlier this year she began developing exertional chest discomfort and a fairly rapidly progressive pattern (her angina is interscapular tightness).  She underwent cardiac catheterization on July 21 and required placement of a new drug-eluting stent (synergy 2.512) in the mid LAD artery, overlapping the previously placed overlapping stents.  For the most part, the restenosis was treated with cutting balloon, with stent placed only in the proximal portion of restenosis to avoid along the area of 3 stent layers.  Note was made of normal left heart filling pressures and very mild aortic stenosis.  Since the procedure she is doing quite well.  She is walking 5 days a week and denies angina or dyspnea.  She has noticed steady improvement in her glucose control since starting treatment with Ozempic and now her fasting glucose is consistently less than 120.  Her lipid profile has improved substantially since she started taking a combination of ezetimibe and rosuvastatin, but she is still not at target LDL less than 70.  She has severe hypertriglyceridemia (598) and chronically low HDL cholesterol (15).  Hard to calculate her LDL, but considering that her total cholesterol is 143, I think we can assume that is higher than 70.  Her last hemoglobin A1c was 9.2%, but has not been rechecked since she started the Ozempic.   She is  planning a family trip to [place] and asks about ways to prevent altitude related illness.  Echo June 2018 showed normal left ventricular systolic function, EF [id number] and mild diastolic dysfunction without elevated filling pressures.  She has a murmur which is due to aortic valve sclerosis.  The patient specifically denies any chest pain at rest exertion, dyspnea at rest or with exertion, orthopnea, paroxysmal nocturnal dyspnea, syncope, palpitations, focal neurological deficits, intermittent claudication, lower extremity edema, unexplained weight gain, cough, hemoptysis or wheezing.  The last interventions were performed in 2010 when she had overlapping stents placed at the level of older stents in the mid LAD artery and in the left circumflex coronary artery. She has insulin-requiring type 2 diabetes mellitus and secondary mixed hypertriglyceridemia and hypercholesterolemia. She seems to have had substantial improvement with Ranexa. Her most recent nuclear stress test performed in November 2016 was normal.  2007  - mid LAD 2.2512 mini vision - left circumflex 2.518 Cypher  - RCA 3.023 Cypher  2010  - mid LAD 2.2512 Taxus overlapping 2.012 mini vision, overlapping another 2.012 mini vision -  left circumflex 2.516 Taxus upstream of previous stent.  2020  - mid LAD Synergy 2.5 x12 , overlapping the proximal area of restenosis in the mid LAD (short area of 3 stent layers).  Echo in June 2018 showed normal left ventricular systolic function.  There was mild LVH and relaxation impairment, but without signs of elevated filling pressures. She had a normal nuclear stress test in November 2016.  EF was 56%. She had a "false positive" ECG response.    Past Medical History:  Diagnosis Date  . Asthma   . Cancer ([place])    basal cell chest  . Complication of anesthesia   . Coronary artery disease   . Depression   . Diabetes mellitus without complication ([place])    type 2  . Heart murmur   .  Hepatitis    auto- immune  . History of kidney stones   . History of nuclear stress test 05/21/2010   dipyridamole; normal perfusion, preserved LV systolic EF of [id number]  . Hyperlipidemia   . Hypertension   . Liver failure ([place])    h/o autoimmune hepatitis   . Myocardial infarction ([place])   . Neuropathy    tingling and burning left leg to foot  . Obesity   . PONV (postoperative nausea and vomiting)   . Psoriasis   . Tubular adenoma of colon    colon path 01/09/2017    Past Surgical History:  Procedure Laterality Date  . Carotid Doppler  10/2008   R & L ICAs 0-49% diameter reduction   . COLONOSCOPY    . CORONARY ANGIOPLASTY WITH STENT PLACEMENT  09/2008   2.25x20mm Cypher DES to in-stent restenosis of prox LAD; Mini-Vision stent x2 2.0x2mm to area distal of initial LAD stent; 3 2.5x78mm Taxus stents prox to Cypher stent in circumflex  . CORONARY ANGIOPLASTY WITH STENT PLACEMENT  09/2005   Cypher DES 3.0x25mm to distal RCA; 2.25x39mm Mini0Vision stent to mid LAD; 2.5x23mm Cypher stent to circumflex  . CORONARY STENT INTERVENTION N/A 11/13/2018   Procedure: CORONARY STENT INTERVENTION;  Surgeon: Jettie Booze, MD;  Location: [facility];  Service: Cardiovascular;  Laterality: N/A;  . LEFT HEART CATH AND CORONARY ANGIOGRAPHY N/A 11/13/2018   Procedure: LEFT HEART CATH AND CORONARY ANGIOGRAPHY;  Surgeon: Jettie Booze, MD;  Location: [facility];  Service: Cardiovascular;  Laterality: N/A;  . LUMBAR LAMINECTOMY/DECOMPRESSION MICRODISCECTOMY Left 10/18/2016   Procedure: Left Lumbar Four-Five Microdiscectomy;  Surgeon: Erline Levine, MD;  Location: [facility];  Service: Neurosurgery;  Laterality: Left;  Left L4-5 Microdiscectomy  . ROTATOR CUFF REPAIR Left   . SHOULDER ARTHROSCOPY W/ ROTATOR CUFF REPAIR Right   . TRANSTHORACIC ECHOCARDIOGRAM  04/2010   EF=>55%, borderline conc LVH; trace MR & TR; AV mildly sclerotic; aortic root sclerosis/calcif  . ureteral stents       Outpatient Medications Prior to Visit  Medication Sig Dispense Refill  . Ascorbic Acid (VITAMIN C PO) Take 5,000 mg by mouth daily.     Marland Kitchen aspirin EC 81 MG tablet Take 81 mg by mouth at bedtime.    Marland Kitchen BREO ELLIPTA 200-25 MCG/INH AEPB TAKE 1 PUFF BY MOUTH EVERY DAY    . clopidogrel (PLAVIX) 75 MG tablet Take 1 tablet (75 mg total) by mouth daily with breakfast. 90 tablet 1  . Coenzyme Q10 300 MG CAPS Take 300 mg by mouth daily.    . Cranberry (SM CRANBERRY) 300 MG tablet Take 300 mg by mouth every evening.    . fenofibrate (TRICOR) 145 MG tablet TAKE 1 TABLET BY MOUTH EVERY DAY (Patient taking differently: Take 145 mg by mouth every evening. ) 90 tablet 3  . glucose blood test strip Pt uses relion meter- test bid for glucose montioring E11.9 Please dispense relion test strips. 100 each 12  . Icosapent Ethyl (VASCEPA) 1 g CAPS Take 2 capsules (2 g total) by mouth 2 (two) times  daily. 120 capsule 11  . Insulin Detemir (LEVEMIR FLEXTOUCH) 100 UNIT/ML Pen INJECT 120 UNITS INTO THE SKIN DAILY. (Patient taking differently: Inject 70 Units into the skin every evening. ) 105 mL 3  . Insulin Pen Needle (BD PEN NEEDLE NANO U/F) 32G X 4 MM MISC Use as directed for insulin administration. 100 each 3  . isosorbide mononitrate (IMDUR) 30 MG 24 hr tablet Take 1 tablet (30 mg total) by mouth daily. ** DO NOT CRUSH ** 90 tablet 3  . L-Lysine 1000 MG TABS Take 1 tablet by mouth daily.    . metFORMIN (GLUCOPHAGE) 1000 MG tablet Take 1 tablet (1,000 mg total) by mouth 2 (two) times daily with a meal. 180 tablet 3  . Methylsulfonylmethane (MSM PO) Take 6,000 mg by mouth daily.    . metoprolol succinate (TOPROL-XL) 25 MG 24 hr tablet TAKE 1.5 TABLETS (37.5 MG TOTAL) BY MOUTH DAILY. (Patient taking differently: Take 25 mg by mouth daily. ) 135 tablet 3  . Multiple Vitamin (MULTIVITAMIN WITH MINERALS) TABS Take 1 tablet by mouth daily.    . nitroGLYCERIN (NITROSTAT) 0.4 MG SL tablet DISSOLVE 1 TAB UNDER THE TONGUE EVERY 5  MIN AS NEEDED FOR CHEST PAIN (MAX OF 3) 25 tablet 0  . RELION LANCETS THIN 26G MISC 1 Units by Does not apply route 2 (two) times daily. E 11.9 100 each 12  . rosuvastatin (CRESTOR) 20 MG tablet TAKE 1 TABLET BY MOUTH EVERY DAY (Patient taking differently: Take 20 mg by mouth every evening. ) 90 tablet 3  . Semaglutide,0.25 or 0.5MG /DOS, (OZEMPIC, 0.25 OR 0.5 MG/DOSE,) 2 MG/1.5ML SOPN Inject 0.5 mg into the skin once a week. (Patient taking differently: Inject 0.5 mg into the skin every Monday. ) 2 pen 5  . sulfamethoxazole-trimethoprim (BACTRIM DS) 800-160 MG tablet Take 1 tablet by mouth 2 (two) times daily. 14 tablet 0  . valsartan (DIOVAN) 160 MG tablet Take 1 tablet (160 mg total) by mouth daily. 90 tablet 3  . ezetimibe (ZETIA) 10 MG tablet Take 10 mg by mouth every evening.      Facility-Administered Medications Prior to Visit  Medication Dose Route Frequency Provider Last Rate Last Dose  . albuterol (PROVENTIL) (2.5 MG/3ML) 0.083% nebulizer solution 2.5 mg  2.5 mg Nebulization Once Rita Ohara, MD         Allergies:   Penicillins   Social History   Socioeconomic History  . Marital status: Married    Spouse name: Not on file  . Number of children: 1  . Years of education: Not on file  . Highest education level: Not on file  Occupational History    Employer: [employer]  . Financial resource strain: Not very hard  . Food insecurity    Worry: Never true    Inability: Never true  . Transportation needs    Medical: No    Non-medical: No  Tobacco Use  . Smoking status: Never Smoker  . Smokeless tobacco: Never Used  Substance and Sexual Activity  . Alcohol use: No  . Drug use: No  . Sexual activity: Not Currently  Lifestyle  . Physical activity    Days per week: 2 days    Minutes per session: Not on file  . Stress: Only a little  Relationships  . Social Herbalist on phone: Three times a week    Gets together: Not on file     Attends religious service: Not on file  Active member of club or organization: Yes    Attends meetings of clubs or organizations: Not on file    Relationship status: Married  Other Topics Concern  . Not on file  Social History Narrative  . Not on file     ROS:   Please see the history of present illness.    ROS All other systems are reviewed and are negative  PHYSICAL EXAM:   VS:  BP (!) 128/58   Pulse 92   Temp 97.9 F (36.6 C)   Ht 5\' 5"  (1.651 m)   Wt 195 lb (88.5 kg)   SpO2 99%   BMI 32.45 kg/m      General: Alert, oriented x3, no distress, mildly obese Head: no evidence of trauma, PERRL, EOMI, no exophtalmos or lid lag, no myxedema, no xanthelasma; normal ears, nose and oropharynx Neck: normal jugular venous pulsations and no hepatojugular reflux; brisk carotid pulses without delay and no carotid bruits Chest: clear to auscultation, no signs of consolidation by percussion or palpation, normal fremitus, symmetrical and full respiratory excursions Cardiovascular: normal position and quality of the apical impulse, regular rhythm, normal first and second heart sounds, early peaking 2/6 systolic ejection murmur in aortic focus radiating to both carotids no diastolic murmurs, rubs or gallops Abdomen: no tenderness or distention, no masses by palpation, no abnormal pulsatility or arterial bruits, normal bowel sounds, no hepatosplenomegaly Extremities: no clubbing, cyanosis or edema; 2+ radial, ulnar and brachial pulses bilaterally; 2+ right femoral, posterior tibial and dorsalis pedis pulses; 2+ left femoral, posterior tibial and dorsalis pedis pulses; no subclavian or femoral bruits Neurological: grossly nonfocal Psych: Normal mood and affect  Wt Readings from Last 3 Encounters:  12/19/18 195 lb (88.5 kg)  11/28/18 198 lb (89.8 kg)  11/19/18 199 lb 6.4 oz (90.4 kg)    Studies/Labs Reviewed:   EKG:  EKG is ordered today.  It shows normal sinus rhythm with a completely  normal tracing, QTC 432 ms Recent Labs: 11/14/2018: Hemoglobin 10.6; Platelets 173 11/19/2018: ALT 30; Magnesium 1.7 11/27/2018: BUN 20; Creatinine, Ser 1.12; Potassium 5.2; Sodium 139   Lipid Panel    Component Value Date/Time   CHOL 143 08/30/2018 0850   TRIG 598 (HH) 08/30/2018 0850   HDL 15 (L) 08/30/2018 0850   CHOLHDL 9.5 (H) 08/30/2018 0850   CHOLHDL 5.1 (H) 07/12/2016 1413   VLDL 50 (H) 07/12/2016 1413   [place] Comment 08/30/2018 0850   LDLDIRECT 93 11/15/2012 0829    ASSESSMENT:    1. Coronary artery disease involving native coronary artery of native heart without angina pectoris   2. Mixed hyperlipidemia   3. Essential hypertension   4. Mild persistent asthma without complication   5. Aortic valve stenosis, nonrheumatic   6. Diabetes mellitus type 2 in obese ([place])   7. Altitude sickness preventative measures      PLAN:  In order of problems listed above:  1. CAD: Now asymptomatic after placement of another stent to the mid LAD artery.  Has had progression of disease despite using aggressive lipid-lowering with 3 agents, not quite reaching target LDL level and still with persistent hypertriglyceridemia. 2. HLP: She has a very disadvantageous lipid profile with high cholesterol, very low HDL, severely elevated triglycerides.  Even on aggressive treatment with combined highly active statin, ezetimibe and fenofibrate we have been unable to achieve target levels of LDL cholesterol and triglycerides.  We will stop the Zetia and start treatment with Praluent, reevaluate in several weeks. 3. Reactive  airway disease: Symptoms are well controlled but do limit the use of higher doses of beta-blockers. 4. AS: She has very mild aortic valve stenosis, very small gradient by direct pressure measurement at the time of cardiac catheterization. 5. HTN: Well-controlled 6. DM: She will have reevaluation with Dr. Cruzita Lederer in October 7. Obesity: Reviewed again ways to achieve weight loss. 8.  Altitude sickness prevention: I gave her prescription for Acetazolamide once daily while she is visiting [place].  Advised her not to leave the relatively lower altitude of [place] until she has acclimatized for at least 48 hours.    Medication Adjustments/Labs and Tests Ordered: Current medicines are reviewed at length with the patient today.  Concerns regarding medicines are outlined above.  Medication changes, Labs and Tests ordered today are listed in the Patient Instructions below. Patient Instructions  Medication Instructions:  STOP the Ezetimibe (Zetia) START Diamox 250 once daily for 10 days  Dr. Sallyanne Kuster recommends Repatha 140 mg every 14 days (PCSK9). This is an injectable cholesterol medication. This medication will need prior approval with your insurance company, which we will work on. If the medication is not approved initially, we may need to do an appeal with your insurance. We will keep you updated on this process. This medication can be provided at some local pharmacies or be shipped to you from a specialty pharmacy.    If you need a refill on your cardiac medications before your next appointment, please call your pharmacy.   Lab work: Your provider would like for you to return in 2 months to have the following labs drawn: CMET and FASTING Lipid. You do not need an appointment for the lab. Once in our office lobby there is a podium where you can sign in and ring the doorbell to alert [place] that you are here. The lab is open from 8:00 am to 4:30 pm; closed for lunch from 12:45pm-1:45pm.  If you have labs (blood work) drawn today and your tests are completely normal, you will receive your results only by: Marland Kitchen MyChart Message (if you have MyChart) OR . A paper copy in the mail If you have any lab test that is abnormal or we need to change your treatment, we will call you to review the results.  Testing/Procedures: None ordered  Follow-Up: At [facility], you and your  health needs are our priority.  As part of our continuing mission to provide you with exceptional heart care, we have created designated Provider Care Teams.  These Care Teams include your primary Cardiologist (physician) and Advanced Practice Providers (APPs -  Physician Assistants and Nurse Practitioners) who all work together to provide you with the care you need, when you need it. You will need a follow up appointment in 3 months.  Please call our office 2 months in advance to schedule this appointment.  You may see Sanda Klein, MD or one of the following Advanced Practice Providers on your designated Care Team: [place], [place] . Fabian Sharp, PA-C        Signed, Sanda Klein, MD  12/21/2018 3:32 PM    [address], [place], [place]  [address] Phone: [phone]; Fax: [phone]

## 2018-12-20 ENCOUNTER — Other Ambulatory Visit: Payer: Self-pay

## 2018-12-20 MED ORDER — PRALUENT 150 MG/ML ~~LOC~~ SOAJ: 150.0000 mg | SUBCUTANEOUS | 6 refills | Status: DC

## 2018-12-21 ENCOUNTER — Encounter: Payer: Self-pay | Admitting: Cardiovascular Disease

## 2018-12-24 ENCOUNTER — Telehealth: Payer: Self-pay

## 2018-12-24 NOTE — Telephone Encounter (Signed)
[facility]. She was confused about the rason to change therapy selection.

## 2018-12-24 NOTE — Telephone Encounter (Signed)
Called and spoke with the pt regarding the praluent being approved. The pt had questions about why they were approved for praluent instead of repatha and I assured the pt that they are used interchangeably and that dr. Sallyanne Kuster stated that its ok to use because I asked him in person last week and the pt was ok with that and stated that she would call the pharmacy to determine the cost and that if it is unaffordable that she would call me back so that we can discuss possible pt assistance avenues to take

## 2018-12-24 NOTE — Telephone Encounter (Signed)
"  Praluent will be approved in place of Repatha" per insurance response.   Information given to patient and she agreed on pursuing Praluent 150mg  as previously ordered.

## 2019-01-03 ENCOUNTER — Other Ambulatory Visit: Payer: Self-pay | Admitting: Family Medicine

## 2019-01-15 ENCOUNTER — Telehealth (HOSPITAL_COMMUNITY): Payer: Self-pay

## 2019-01-15 ENCOUNTER — Encounter (HOSPITAL_COMMUNITY): Payer: Self-pay

## 2019-01-15 NOTE — Telephone Encounter (Signed)
Attempted to contact pt in regards to VCR, LMTCB.  Mailed letter

## 2019-01-21 ENCOUNTER — Other Ambulatory Visit: Payer: Self-pay | Admitting: Family Medicine

## 2019-01-21 DIAGNOSIS — E118 Type 2 diabetes mellitus with unspecified complications: Secondary | ICD-10-CM

## 2019-01-28 ENCOUNTER — Other Ambulatory Visit: Payer: Self-pay | Admitting: Family Medicine

## 2019-01-28 ENCOUNTER — Other Ambulatory Visit: Payer: Self-pay

## 2019-01-29 ENCOUNTER — Encounter: Payer: Self-pay | Admitting: Internal Medicine

## 2019-01-29 ENCOUNTER — Ambulatory Visit: Payer: BC Managed Care – PPO | Admitting: Internal Medicine

## 2019-01-29 VITALS — BP 154/82 | HR 105 | Temp 98.0°F | Ht 64.0 in | Wt 193.0 lb

## 2019-01-29 DIAGNOSIS — Z23 Encounter for immunization: Secondary | ICD-10-CM | POA: Diagnosis not present

## 2019-01-29 DIAGNOSIS — E118 Type 2 diabetes mellitus with unspecified complications: Secondary | ICD-10-CM

## 2019-01-29 LAB — POCT GLYCOSYLATED HEMOGLOBIN (HGB A1C): Hemoglobin A1C: 6.3 % — AB (ref 4.0–5.6)

## 2019-01-29 MED ORDER — OZEMPIC (0.25 OR 0.5 MG/DOSE) 2 MG/1.5ML ~~LOC~~ SOPN: 0.5000 mg | PEN_INJECTOR | SUBCUTANEOUS | 3 refills | Status: DC

## 2019-01-29 MED ORDER — LEVEMIR FLEXTOUCH 100 UNIT/ML ~~LOC~~ SOPN: 50.0000 [IU] | PEN_INJECTOR | Freq: Every evening | SUBCUTANEOUS | 3 refills | Status: DC

## 2019-01-29 MED ORDER — GLIPIZIDE 5 MG PO TABS: 2.5000 mg | ORAL_TABLET | Freq: Every day | ORAL | 3 refills | Status: DC

## 2019-01-29 MED ORDER — METFORMIN HCL 1000 MG PO TABS: 1000.0000 mg | ORAL_TABLET | Freq: Two times a day (BID) | ORAL | 3 refills | Status: DC

## 2019-01-29 NOTE — Patient Instructions (Addendum)
Please continue: - Metformin 1000 mg 2x a day with meals - Ozempic 0.5 mg weekly  Please decrease: - Levemir to 50 units at bedtime  Please add: - Glipizide 2.5-5 mg 15-30 min before a larger meal.  Please come back for a follow-up appointment in 3-4 months.

## 2019-01-29 NOTE — Progress Notes (Signed)
Patient ID: Haley George, female   DOB: 1955-09-10, 63 y.o.   MRN: [id number]   HPI: Haley George is a 63 y.o.-year-old female, initially referred by her PCP, Dr. Redmond School, returning for follow-up for DM2, dx initially in 1997 2/2 liver failure, then GDM, then re-occuring in 2007, insulin-dependent, uncontrolled, with long-term complications (CAD-s/p AMI, s/p stents, aortic valve stenosis; CKD).  Our first office visit was at the end of 09/2018.  She was admitted for chest pain 10/2018 after an episode of dehydration and had heart catheterization at that time >> has another stent and the previous stent was recanalized.  Reviewed HbA1c levels: Lab Results  Component Value Date   HGBA1C 9.2 (A) 08/30/2018   HGBA1C 8.8 (A) 03/27/2018   HGBA1C 9.2 (A) 12/19/2017   HGBA1C 7.3 03/13/2017   HGBA1C 10.5 (H) 10/18/2016   HGBA1C 7.1 07/12/2016   HGBA1C 6.8 02/10/2015   HGBA1C 7.9 10/02/2014   HGBA1C 7.8 05/06/2014   HGBA1C 7.1 (H) 01/03/2014  She was on steroid injections in 2019.  Pt is on a regimen of: - Metformin 1000 mg 2x a day, with meals - Levemir 140 (70 units x2) units at bedtime >> 70 units at bedtime -decreased 09/2018 - Ozempic 0.5 mg weekly-added 09/2018 She was on Actos before.  Pt checks her sugars twice a day: - am: 128-200 >> 93-110 - 2h after b'fast: n/c - before lunch: n/c - 2h after lunch: n/c - before dinner: n/c - 2h after dinner: up to 300 >> 150-220 - bedtime: n/c - nighttime: n/c Lowest sugar was 40 - 2012, since then 109, but usually 121 >> 93; she has hypoglycemia awareness at 70. Highest sugar was 368 >> 220.  Glucometer: ReliOn  Pt's meals are: - Breakfast: if not skipping - egg + whole wheat toast; cheerios + milk - Lunch: chicken salad, apple, veggies, trisquits  - Dinner: meat + 2 veggies +/- rolls  -No h/o CKD, last BUN/creatinine:  Lab Results  Component Value Date   BUN 20 11/27/2018   BUN 21 11/19/2018   CREATININE 1.12 (H) 11/27/2018    CREATININE 1.17 (H) 11/19/2018  On Diovan 160.  -+ HL; last set of lipids: Lab Results  Component Value Date   CHOL 143 08/30/2018   HDL 15 (L) 08/30/2018   LDLCALC Comment 08/30/2018   LDLDIRECT 93 11/15/2012   TRIG 598 (HH) 08/30/2018   CHOLHDL 9.5 (H) 08/30/2018  On fenofibrate, Vascepa, Crestor 20. On Praluent - just started in 12/2018.  - last eye exam was in 05/2017: No DR  - no numbness and tingling in her feet.  Pt has FH of DM in M, M uncle, MGM.  She has a history of autoimmune hepatitis.  She lost a signif. amount of weight on [employer] in 2018.  She also has a history of psoriasis diagnosed as a child.  ROS: Constitutional: no weight gain/no weight loss, no fatigue, no subjective hyperthermia, no subjective hypothermia Eyes: no blurry vision, no xerophthalmia ENT: no sore throat, no nodules palpated in neck, no dysphagia, no odynophagia, no hoarseness Cardiovascular: no CP/no SOB/no palpitations/no leg swelling Respiratory: no cough/no SOB/no wheezing Gastrointestinal: no N/no V/no D/no C/no acid reflux Musculoskeletal: no muscle aches/no joint aches Skin: no rashes, no hair loss Neurological: no tremors/no numbness/no tingling/no dizziness  I reviewed pt's medications, allergies, PMH, social hx, family hx, and changes were documented in the history of present illness. Otherwise, unchanged from my initial visit note.  Past Medical History:  Diagnosis Date  . Asthma   . Cancer ([place])    basal cell chest  . Complication of anesthesia   . Coronary artery disease   . Depression   . Diabetes mellitus without complication ([place])    type 2  . Heart murmur   . Hepatitis    auto- immune  . History of kidney stones   . History of nuclear stress test 05/21/2010   dipyridamole; normal perfusion, preserved LV systolic EF of [id number]  . Hyperlipidemia   . Hypertension   . Liver failure ([place])    h/o autoimmune hepatitis   . Myocardial infarction ([place])   . Neuropathy     tingling and burning left leg to foot  . Obesity   . PONV (postoperative nausea and vomiting)   . Psoriasis   . Tubular adenoma of colon    colon path 01/09/2017   Past Surgical History:  Procedure Laterality Date  . Carotid Doppler  10/2008   R & L ICAs 0-49% diameter reduction   . COLONOSCOPY    . CORONARY ANGIOPLASTY WITH STENT PLACEMENT  09/2008   2.25x61mm Cypher DES to in-stent restenosis of prox LAD; Mini-Vision stent x2 2.0x87mm to area distal of initial LAD stent; 3 2.5x32mm Taxus stents prox to Cypher stent in circumflex  . CORONARY ANGIOPLASTY WITH STENT PLACEMENT  09/2005   Cypher DES 3.0x20mm to distal RCA; 2.25x52mm Mini0Vision stent to mid LAD; 2.5x20mm Cypher stent to circumflex  . CORONARY STENT INTERVENTION N/A 11/13/2018   Procedure: CORONARY STENT INTERVENTION;  Surgeon: Jettie Booze, MD;  Location: [facility];  Service: Cardiovascular;  Laterality: N/A;  . LEFT HEART CATH AND CORONARY ANGIOGRAPHY N/A 11/13/2018   Procedure: LEFT HEART CATH AND CORONARY ANGIOGRAPHY;  Surgeon: Jettie Booze, MD;  Location: [facility];  Service: Cardiovascular;  Laterality: N/A;  . LUMBAR LAMINECTOMY/DECOMPRESSION MICRODISCECTOMY Left 10/18/2016   Procedure: Left Lumbar Four-Five Microdiscectomy;  Surgeon: Erline Levine, MD;  Location: [facility];  Service: Neurosurgery;  Laterality: Left;  Left L4-5 Microdiscectomy  . ROTATOR CUFF REPAIR Left   . SHOULDER ARTHROSCOPY W/ ROTATOR CUFF REPAIR Right   . TRANSTHORACIC ECHOCARDIOGRAM  04/2010   EF=>55%, borderline conc LVH; trace MR & TR; AV mildly sclerotic; aortic root sclerosis/calcif  . ureteral stents     Social History   Socioeconomic History  . Marital status: Single    Spouse name: Not on file  . Number of children: 1  . Years of education: Not on file  . 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 Financial resource strain: Not on file  . Food insecurity    Worry: Not on file    Inability: Not on file  . Transportation needs    Medical: Not on file    Non-medical: Not on file  Tobacco Use  . Smoking status: Never Smoker  . Smokeless tobacco: Never Used  Substance and Sexual Activity  . Alcohol use: No  . Drug use: No  . Sexual activity: Not Currently  Lifestyle  . Physical activity    Days per week: Not on file    Minutes per session: Not on file  . Stress: Not on file  Relationships  . Social Herbalist on phone: Not on file    Gets together: Not on file    Attends religious service: Not on file  Active member of club or organization: Not on file    Attends meetings of clubs or organizations: Not on file    Relationship status: Not on file  . Intimate partner violence    Fear of current or ex partner: Not on file    Emotionally abused: Not on file    Physically abused: Not on file    Forced sexual activity: Not on file  Other Topics Concern  . Not on file  Social History Narrative  . Not on file   Current Outpatient Medications on File Prior to Visit  Medication Sig Dispense Refill  . acetaZOLAMIDE (DIAMOX) 250 MG tablet Take 1 tablet (250 mg total) by mouth daily. 10 tablet 0  . Alirocumab (PRALUENT) 150 MG/ML SOAJ Inject 150 mg into the skin every 14 (fourteen) days. 2 pen 6  . Ascorbic Acid (VITAMIN C PO) Take 5,000 mg by mouth daily.     Marland Kitchen aspirin EC 81 MG tablet Take 81 mg by mouth at bedtime.    Marland Kitchen BREO ELLIPTA 200-25 MCG/INH AEPB TAKE 1 PUFF BY MOUTH EVERY DAY    . Coenzyme Q10 300 MG CAPS Take 300 mg by mouth daily.    . Cranberry (SM CRANBERRY) 300 MG tablet Take 300 mg by mouth every evening.    . fenofibrate (TRICOR) 145 MG tablet TAKE 1 TABLET BY MOUTH EVERY DAY (Patient taking differently: Take 145 mg by mouth every evening. ) 90 tablet 3  . glucose blood test strip Pt uses relion meter- test bid for glucose montioring  E11.9 Please dispense relion test strips. 100 each 12  . Icosapent Ethyl (VASCEPA) 1 g CAPS Take 2 capsules (2 g total) by mouth 2 (two) times daily. 120 capsule 11  . Insulin Detemir (LEVEMIR FLEXTOUCH) 100 UNIT/ML Pen INJECT 120 UNITS INTO THE SKIN DAILY. (Patient taking differently: Inject 70 Units into the skin every evening. ) 105 mL 3  . Insulin Pen Needle (BD PEN NEEDLE NANO U/F) 32G X 4 MM MISC Use as directed for insulin administration. 100 each 3  . isosorbide mononitrate (IMDUR) 30 MG 24 hr tablet Take 1 tablet (30 mg total) by mouth daily. ** DO NOT CRUSH ** 90 tablet 3  . L-Lysine 1000 MG TABS Take 1 tablet by mouth daily.    . metFORMIN (GLUCOPHAGE) 1000 MG tablet TAKE 1 TABLET (1,000 MG TOTAL) BY MOUTH 2 (TWO) TIMES DAILY WITH A MEAL. 180 tablet 3  . Methylsulfonylmethane (MSM PO) Take 6,000 mg by mouth daily.    . metoprolol succinate (TOPROL-XL) 25 MG 24 hr tablet TAKE 1.5 TABLETS (37.5 MG TOTAL) BY MOUTH DAILY. (Patient taking differently: Take 25 mg by mouth daily. ) 135 tablet 3  . Multiple Vitamin (MULTIVITAMIN WITH MINERALS) TABS Take 1 tablet by mouth daily.    . nitroGLYCERIN (NITROSTAT) 0.4 MG SL tablet DISSOLVE 1 TAB UNDER THE TONGUE EVERY 5 MIN AS NEEDED FOR CHEST PAIN (MAX OF 3) 25 tablet 0  . RELION LANCETS THIN 26G MISC 1 Units by Does not apply route 2 (two) times daily. E 11.9 100 each 12  . rosuvastatin (CRESTOR) 20 MG tablet TAKE 1 TABLET BY MOUTH EVERY DAY (Patient taking differently: Take 20 mg by mouth every evening. ) 90 tablet 3  . Semaglutide,0.25 or 0.5MG /DOS, (OZEMPIC, 0.25 OR 0.5 MG/DOSE,) 2 MG/1.5ML SOPN Inject 0.5 mg into the skin once a week. (Patient taking differently: Inject 0.5 mg into the skin every Monday. ) 2 pen 5  . sulfamethoxazole-trimethoprim (  BACTRIM DS) 800-160 MG tablet Take 1 tablet by mouth 2 (two) times daily. 14 tablet 0  . valsartan (DIOVAN) 160 MG tablet Take 1 tablet (160 mg total) by mouth daily. 90 tablet 3   Current  Facility-Administered Medications on File Prior to Visit  Medication Dose Route Frequency Provider Last Rate Last Dose  . albuterol (PROVENTIL) (2.5 MG/3ML) 0.083% nebulizer solution 2.5 mg  2.5 mg Nebulization Once Rita Ohara, MD       Allergies  Allergen Reactions  . Penicillins Hives     PATIENT HAD A PCN REACTION WITH IMMEDIATE RASH, FACIAL/TONGUE/THROAT SWELLING, SOB, OR LIGHTHEADEDNESS WITH HYPOTENSION:  #  #  #  YES  #  #  #   Has patient had a PCN reaction causing severe rash involving mucus membranes or skin necrosis:Unknown Has patient had a PCN reaction that required hospitalization:No Has patient had a PCN reaction occurring within the last 10 years:No If all of the above answers are "NO", then may proceed with Cephalosporin use.    Family History  Problem Relation Age of Onset  . Diabetes Brother     PE: BP (!) 154/82   Pulse (!) 105   Temp 98 F (36.7 C)   Ht 5\' 4"  ([id number] m)   Wt 193 lb (87.5 kg)   SpO2 98%   BMI 33.13 kg/m  Wt Readings from Last 3 Encounters:  01/29/19 193 lb (87.5 kg)  12/19/18 195 lb (88.5 kg)  11/28/18 198 lb (89.8 kg)   Constitutional: overweight, in NAD Eyes: PERRLA, EOMI, no exophthalmos ENT: moist mucous membranes, no thyromegaly, no cervical lymphadenopathy Cardiovascular: tachycardia, RR, +2/6 SEM, No R and G Respiratory: CTA B Gastrointestinal: abdomen soft, NT, ND, BS+ Musculoskeletal: no deformities, strength intact in all 4 Skin: moist, warm, no rashes Neurological: no tremor with outstretched hands, DTR normal in all 4  ASSESSMENT: 1. DM2, insulin-dependent, uncontrolled, with long-term complications - CAD-s/p AMI, s/p stents, aortic valve stenosis - CKD  2. HL  3.  Obesity  PLAN:  1. Patient with longstanding, uncontrolled, diabetes, on oral antidiabetic regimen (metformin), and long-acting insulin, to which we added a GLP-1 receptor agonist at last visit, after an HbA1c returned quite high, at 9.2%. -At last  visit, she was very discouraged and felt that her body was working against her.  It was difficult for her to put more effort into her diabetes control.  She saw nutrition in the past and this did not help.  She was not checking sugars as she was disheartened by the fact that they were still high.  At that time, I made specific suggestions about improving diet, towards lower fat 1 and we discussed about options for healthier meals.  I also advised her to stop cranberry juice.  We also discussed at that time about correct injection techniques as I suspected that she had a lot of scar tissue that was preventing her getting the entire insulin dose.  We did not start an SGLT 2 inhibitor as she was concerned about yeast infections (her husband is on [place]).  Her daughter was on Trulicity and was having nausea after each injections, but she did decide to try Ozempic. -At this visit, she tells me that she is very happy with her regimen and that decreasing her insulin dose.  Her sugars improved significantly at all times of the day, however, she still has higher blood sugars after dinner especially in the second half of the week, when the Ozempic dose starts  wearing off.  She takes this on Mondays and she does have some GI discomfort right after she takes it so I would not suggest to increase the dose for now.  States that sugars are only high after dinner and some of the days of the week, we discussed about adding glipizide (low-dose) before culprit dinners.  Otherwise, we will go ahead and decrease the insulin dose even further. - I suggested to:  Patient Instructions  Please continue: - Metformin 1000 mg 2x a day with meals - Ozempic 0.5 mg weekly  Please decrease: - Levemir to 50 units at bedtime  Please add: - Glipizide 2.5-5 mg 15-30 min before a larger meal.  Please come back for a follow-up appointment in 3-4 months.  - we checked her HbA1c: 6.3% (MUCH better!) - advised to check sugars at  different times of the day - 1x a day, rotating check times - advised for yearly eye exams >> she is not UTD - return to clinic in 3-4 months  2. HL - Reviewed latest lipid panel from 08/2018: Triglycerides very high, LDL at goal, HDL very low Lab Results  Component Value Date   CHOL 143 08/30/2018   HDL 15 (L) 08/30/2018   LDLCALC Comment 08/30/2018   LDLDIRECT 93 11/15/2012   TRIG 598 (HH) 08/30/2018   CHOLHDL 9.5 (H) 08/30/2018  - Continues Crestor 20, fenofibrate, and Vascepa without side effects. Now also on Praluent.  3.  Obesity -She lost 10 pounds since last visit and feels much better.  She started to improve her diet and walk more. -Continue GLP-1 receptor agonist, which should also help with weight loss -At this visit, we will continue to reduce the insulin, which should also help with weight loss  [name], MD PhD [facility]

## 2019-01-29 NOTE — Progress Notes (Signed)
Merci! 

## 2019-01-29 NOTE — Telephone Encounter (Signed)
No response from pt regarding CR.  Closed referral.  

## 2019-01-31 ENCOUNTER — Other Ambulatory Visit: Payer: Self-pay | Admitting: Family Medicine

## 2019-02-01 NOTE — Telephone Encounter (Signed)
Is this okay to refill? 

## 2019-02-14 ENCOUNTER — Ambulatory Visit: Payer: BC Managed Care – PPO | Admitting: Cardiovascular Disease

## 2019-02-14 ENCOUNTER — Encounter

## 2019-02-20 ENCOUNTER — Telehealth: Payer: Self-pay

## 2019-02-20 NOTE — Telephone Encounter (Signed)
Pt. Called stating that she is due for her Bone Density exam at [place], but needs an order from our office. Order needed for Bone Density.

## 2019-02-21 NOTE — Telephone Encounter (Signed)
Working on order and notice pt has not had mammo since 2018. LVM to see if this needed to be added. [place]

## 2019-02-22 NOTE — Telephone Encounter (Signed)
Done and faxed over order. [place]

## 2019-03-01 LAB — HM MAMMOGRAPHY

## 2019-03-11 LAB — HM DEXA SCAN

## 2019-03-12 ENCOUNTER — Other Ambulatory Visit: Payer: Self-pay

## 2019-03-15 ENCOUNTER — Ambulatory Visit: Payer: BC Managed Care – PPO | Admitting: Cardiovascular Disease

## 2019-03-25 ENCOUNTER — Telehealth: Payer: Self-pay | Admitting: Family Medicine

## 2019-03-25 DIAGNOSIS — E118 Type 2 diabetes mellitus with unspecified complications: Secondary | ICD-10-CM

## 2019-03-25 DIAGNOSIS — E1169 Type 2 diabetes mellitus with other specified complication: Secondary | ICD-10-CM

## 2019-03-25 DIAGNOSIS — E785 Hyperlipidemia, unspecified: Secondary | ICD-10-CM

## 2019-03-25 MED ORDER — VASCEPA 1 G PO CAPS: 2.0000 g | ORAL_CAPSULE | Freq: Two times a day (BID) | ORAL | 3 refills | Status: DC

## 2019-03-25 MED ORDER — METFORMIN HCL 1000 MG PO TABS: 1000.0000 mg | ORAL_TABLET | Freq: Two times a day (BID) | ORAL | 1 refills | Status: DC

## 2019-03-25 NOTE — Telephone Encounter (Signed)
Pt called and said she needs refill on Vascepa and wants to see if she could get a 90 day supply. Pt also needs refill on Metformin 1000mg . [facility] uses the [facility] on [address].

## 2019-03-27 ENCOUNTER — Other Ambulatory Visit: Payer: Self-pay | Admitting: Family Medicine

## 2019-04-05 ENCOUNTER — Other Ambulatory Visit (HOSPITAL_COMMUNITY): Payer: Self-pay | Admitting: Dermatology

## 2019-04-05 ENCOUNTER — Other Ambulatory Visit: Payer: Self-pay

## 2019-04-05 ENCOUNTER — Ambulatory Visit (HOSPITAL_COMMUNITY)
Admission: RE | Admit: 2019-04-05 | Discharge: 2019-04-05 | Disposition: A | Discharge status: Home / Self Care | Payer: BC Managed Care – PPO | Source: Ambulatory Visit | Attending: Dermatology | Admitting: Dermatology

## 2019-04-05 DIAGNOSIS — M898X6 Other specified disorders of bone, lower leg: Secondary | ICD-10-CM | POA: Diagnosis not present

## 2019-04-13 ENCOUNTER — Other Ambulatory Visit: Payer: Self-pay | Admitting: Nurse Practitioner

## 2019-04-16 ENCOUNTER — Other Ambulatory Visit (HOSPITAL_COMMUNITY): Payer: Self-pay | Admitting: Gastroenterology

## 2019-04-16 ENCOUNTER — Other Ambulatory Visit: Payer: Self-pay | Admitting: Gastroenterology

## 2019-04-16 DIAGNOSIS — K754 Autoimmune hepatitis: Secondary | ICD-10-CM

## 2019-04-19 ENCOUNTER — Other Ambulatory Visit: Payer: Self-pay | Admitting: Family Medicine

## 2019-04-20 ENCOUNTER — Other Ambulatory Visit: Payer: Self-pay | Admitting: Family Medicine

## 2019-04-20 ENCOUNTER — Other Ambulatory Visit: Payer: Self-pay | Admitting: Cardiovascular Disease

## 2019-04-20 DIAGNOSIS — E1159 Type 2 diabetes mellitus with other circulatory complications: Secondary | ICD-10-CM

## 2019-04-20 DIAGNOSIS — E1169 Type 2 diabetes mellitus with other specified complication: Secondary | ICD-10-CM

## 2019-04-20 DIAGNOSIS — E785 Hyperlipidemia, unspecified: Secondary | ICD-10-CM

## 2019-04-22 ENCOUNTER — Ambulatory Visit (HOSPITAL_COMMUNITY)
Admission: RE | Admit: 2019-04-22 | Discharge: 2019-04-22 | Disposition: A | Discharge status: Home / Self Care | Payer: BC Managed Care – PPO | Source: Ambulatory Visit | Attending: Gastroenterology | Admitting: Gastroenterology

## 2019-04-22 ENCOUNTER — Other Ambulatory Visit: Payer: Self-pay

## 2019-04-22 DIAGNOSIS — K754 Autoimmune hepatitis: Secondary | ICD-10-CM

## 2019-04-25 ENCOUNTER — Other Ambulatory Visit: Payer: BC Managed Care – PPO

## 2019-04-25 ENCOUNTER — Other Ambulatory Visit: Payer: Self-pay | Admitting: Internal Medicine

## 2019-05-01 ENCOUNTER — Encounter: Payer: Self-pay | Admitting: Internal Medicine

## 2019-05-01 NOTE — Progress Notes (Signed)
Received labs from 04/16/2019: BUN/creatinine 21/1.17, GFR 50, alkaline phosphatase 31 (37-153) HbA1c 6.2% CBC normal  hep C and hep B nonreactive

## 2019-05-07 ENCOUNTER — Other Ambulatory Visit: Payer: Self-pay

## 2019-05-09 ENCOUNTER — Encounter: Payer: Self-pay | Admitting: Internal Medicine

## 2019-05-09 ENCOUNTER — Ambulatory Visit: Payer: BC Managed Care – PPO | Admitting: Internal Medicine

## 2019-05-09 VITALS — BP 138/80 | HR 102 | Ht 64.0 in | Wt 186.0 lb

## 2019-05-09 DIAGNOSIS — E119 Type 2 diabetes mellitus without complications: Secondary | ICD-10-CM

## 2019-05-09 DIAGNOSIS — E1169 Type 2 diabetes mellitus with other specified complication: Secondary | ICD-10-CM | POA: Diagnosis not present

## 2019-05-09 DIAGNOSIS — Z794 Long term (current) use of insulin: Secondary | ICD-10-CM

## 2019-05-09 DIAGNOSIS — Z6833 Body mass index (BMI) 33.0-33.9, adult: Secondary | ICD-10-CM

## 2019-05-09 DIAGNOSIS — E669 Obesity, unspecified: Secondary | ICD-10-CM | POA: Diagnosis not present

## 2019-05-09 DIAGNOSIS — E785 Hyperlipidemia, unspecified: Secondary | ICD-10-CM

## 2019-05-09 NOTE — Progress Notes (Signed)
Patient ID: Haley George, female   DOB: Jan 10, 1956, 64 y.o.   MRN: [id number]   This visit occurred during the SARS-CoV-2 public health emergency.  Safety protocols were in place, including screening questions prior to the visit, additional usage of staff PPE, and extensive cleaning of exam room while observing appropriate contact time as indicated for disinfecting solutions.   HPI: Haley George is a 64 y.o.-year-old female, initially referred by her PCP, Dr. Redmond School, returning for follow-up for DM2, dx initially in 1997 2/2 liver failure, then GDM, then re-occuring in 2007, insulin-dependent, uncontrolled, with long-term complications (CAD-s/p AMI, s/p stents, aortic valve stenosis; CKD).  Our first office visit was at the end of 09/2018. Last visit 01/2019.  She had 2 steroid inj's in last 3 mo >> sugars higher.  She is preparing to start [facility] for autoimmune hepatitis.  Reviewed her HbA1c levels: 05/01/2019: HbA1c 6.2% Lab Results  Component Value Date   HGBA1C 6.3 (A) 01/29/2019   HGBA1C 9.2 (A) 08/30/2018   HGBA1C 8.8 (A) 03/27/2018   HGBA1C 9.2 (A) 12/19/2017   HGBA1C 7.3 03/13/2017   HGBA1C 10.5 (H) 10/18/2016   HGBA1C 7.1 07/12/2016   HGBA1C 6.8 02/10/2015   HGBA1C 7.9 10/02/2014   HGBA1C 7.8 05/06/2014  She was on steroid injections in 2019.  Pt is on a regimen of: - Metformin 1000 mg 2x a day, with meals - Glipizide 2.5 to 5 mg before a meal - added 01/2019 >> did not use it much (forgets) - Levemir 140 (70 units x2) units at bedtime >> 70 units >> 50-70 units at bedtime   - Ozempic 0.5 mg weekly-added 09/2018 She was on Actos before.  Pt checks her sugars 2-3x a day: - am: 128-200 >> 93-110 >> 91-126 - 2h after b'fast: n/c - before lunch: n/c - 2h after lunch: n/c >> 180-200 - before dinner: n/c - 2h after dinner: up to 300 >> 150-220 >> 180-220 - bedtime: n/c - nighttime: n/c Lowest sugar was 40 - 2012, since then 109, but usually 121 >> 93 >> 91; she has  hypoglycemia awareness in the 70s. Highest sugar was 368 >> 220 >> 220.  Glucometer: ReliOn  Pt's meals are: - Breakfast: if not skipping - egg + whole wheat toast; cheerios + milk - Lunch: chicken salad, apple, veggies, trisquits  - Dinner: meat + 2 veggies +/- rolls  -+ CKD, last BUN/creatinine:  04/16/2019: BUN/creatinine 21/1.17, GFR 50, alkaline phosphatase 31 (37-153) Lab Results  Component Value Date   BUN 20 11/27/2018   BUN 21 11/19/2018   CREATININE 1.12 (H) 11/27/2018   CREATININE 1.17 (H) 11/19/2018  On Diovan 160.  -+ HL; last set of lipids: Lab Results  Component Value Date   CHOL 143 08/30/2018   HDL 15 (L) 08/30/2018   LDLCALC Comment 08/30/2018   LDLDIRECT 93 11/15/2012   TRIG 598 (HH) 08/30/2018   CHOLHDL 9.5 (H) 08/30/2018  On Crestor 20, fenofibrate, Vascepa. She started Praluent 12/2018.  - last eye exam was in 07/2018: No DR reportedly. Dr. Katy Fitch.  - no numbness and tingling in her feet.  Pt has FH of DM in M, M uncle, MGM.  She was admitted for chest pain 10/2018 after an episode of dehydration and had heart catheterization at that time >> has another stent and the previous stent was recanalized.  She has a history of autoimmune hepatitis. Also, psoriasis, diagnosed as a child.  She lost a signif. amount of weight on [facility]  [facility] in 2018.    ROS: Constitutional: no weight gain/no weight loss, no fatigue, no subjective hyperthermia, no subjective hypothermia Eyes: no blurry vision, no xerophthalmia ENT: no sore throat, no nodules palpated in neck, no dysphagia, no odynophagia, no hoarseness Cardiovascular: no CP/no SOB/no palpitations/no leg swelling Respiratory: no cough/no SOB/no wheezing Gastrointestinal: no N/no V/no D/no C/no acid reflux Musculoskeletal: no muscle aches/no joint aches Skin: no rashes, no hair loss Neurological: no tremors/no numbness/no tingling/no dizziness  I reviewed pt's medications, allergies, PMH, social hx, family  hx, and changes were documented in the history of present illness. Otherwise, unchanged from my initial visit note.  Past Medical History:  Diagnosis Date  . Asthma   . Cancer ([place])    basal cell chest  . Complication of anesthesia   . Coronary artery disease   . Depression   . Diabetes mellitus without complication ([place])    type 2  . Heart murmur   . Hepatitis    auto- immune  . History of kidney stones   . History of nuclear stress test 05/21/2010   dipyridamole; normal perfusion, preserved LV systolic EF of [id number]  . Hyperlipidemia   . Hypertension   . Liver failure ([place])    h/o autoimmune hepatitis   . Myocardial infarction ([place])   . Neuropathy    tingling and burning left leg to foot  . Obesity   . PONV (postoperative nausea and vomiting)   . Psoriasis   . Tubular adenoma of colon    colon path 01/09/2017   Past Surgical History:  Procedure Laterality Date  . Carotid Doppler  10/2008   R & L ICAs 0-49% diameter reduction   . COLONOSCOPY    . CORONARY ANGIOPLASTY WITH STENT PLACEMENT  09/2008   2.25x33mm Cypher DES to in-stent restenosis of prox LAD; Mini-Vision stent x2 2.0x59mm to area distal of initial LAD stent; 3 2.5x39mm Taxus stents prox to Cypher stent in circumflex  . CORONARY ANGIOPLASTY WITH STENT PLACEMENT  09/2005   Cypher DES 3.0x15mm to distal RCA; 2.25x43mm Mini0Vision stent to mid LAD; 2.5x52mm Cypher stent to circumflex  . CORONARY STENT INTERVENTION N/A 11/13/2018   Procedure: CORONARY STENT INTERVENTION;  Surgeon: Jettie Booze, MD;  Location: [facility];  Service: Cardiovascular;  Laterality: N/A;  . LEFT HEART CATH AND CORONARY ANGIOGRAPHY N/A 11/13/2018   Procedure: LEFT HEART CATH AND CORONARY ANGIOGRAPHY;  Surgeon: Jettie Booze, MD;  Location: [facility] [facility];  Service: Cardiovascular;  Laterality: N/A;  . LUMBAR LAMINECTOMY/DECOMPRESSION MICRODISCECTOMY Left 10/18/2016   Procedure: Left Lumbar Four-Five Microdiscectomy;   Surgeon: Erline Levine, MD;  Location: [facility];  Service: Neurosurgery;  Laterality: Left;  Left L4-5 Microdiscectomy  . ROTATOR CUFF REPAIR Left   . SHOULDER ARTHROSCOPY W/ ROTATOR CUFF REPAIR Right   . TRANSTHORACIC ECHOCARDIOGRAM  04/2010   EF=>55%, borderline conc LVH; trace MR & TR; AV mildly sclerotic; aortic root sclerosis/calcif  . ureteral stents     Social History   Socioeconomic History  . Marital status: Single    Spouse name: Not on file  . Number of children: 1  . Years of education: Not on file  . 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 Financial resource strain: Not on file  . Food insecurity    Worry: Not on file    Inability: Not on file  . Transportation  needs    Medical: Not on file    Non-medical: Not on file  Tobacco Use  . Smoking status: Never Smoker  . Smokeless tobacco: Never Used  Substance and Sexual Activity  . Alcohol use: No  . Drug use: No  . Sexual activity: Not Currently  Lifestyle  . Physical activity    Days per week: Not on file    Minutes per session: Not on file  . Stress: Not on file  Relationships  . Social Herbalist on phone: Not on file    Gets together: Not on file    Attends religious service: Not on file    Active member of club or organization: Not on file    Attends meetings of clubs or organizations: Not on file    Relationship status: Not on file  . Intimate partner violence    Fear of current or ex partner: Not on file    Emotionally abused: Not on file    Physically abused: Not on file    Forced sexual activity: Not on file  Other Topics Concern  . Not on file  Social History Narrative  . Not on file   Current Outpatient Medications on File Prior to Visit  Medication Sig Dispense Refill  . acetaZOLAMIDE (DIAMOX) 250 MG tablet Take 1 tablet (250 mg total) by mouth daily. 10 tablet 0   . Alirocumab (PRALUENT) 150 MG/ML SOAJ Inject 150 mg into the skin every 14 (fourteen) days. 2 pen 6  . Ascorbic Acid (VITAMIN C PO) Take 5,000 mg by mouth daily.     Marland Kitchen aspirin EC 81 MG tablet Take 81 mg by mouth at bedtime.    Marland Kitchen BREO ELLIPTA 200-25 MCG/INH AEPB TAKE 1 PUFF BY MOUTH EVERY DAY    . Coenzyme Q10 300 MG CAPS Take 300 mg by mouth daily.    . Cranberry (SM CRANBERRY) 300 MG tablet Take 300 mg by mouth every evening.    . fenofibrate (TRICOR) 145 MG tablet TAKE 1 TABLET BY MOUTH EVERY DAY (Patient taking differently: Take 145 mg by mouth every evening. ) 90 tablet 3  . glipiZIDE (GLUCOTROL) 5 MG tablet Take 0.5-1 tablets (2.5-5 mg total) by mouth daily before supper. 60 tablet 3  . glucose blood test strip Pt uses relion meter- test bid for glucose montioring E11.9 Please dispense relion test strips. 100 each 12  . icosapent Ethyl (VASCEPA) 1 g capsule Take 2 capsules (2 g total) by mouth 2 (two) times daily. 360 capsule 3  . Insulin Detemir (LEVEMIR FLEXTOUCH) 100 UNIT/ML Pen Inject 50 Units into the skin every evening. 45 pen 3  . Insulin Pen Needle (BD PEN NEEDLE NANO U/F) 32G X 4 MM MISC Use as directed for insulin administration. 100 each 3  . isosorbide mononitrate (IMDUR) 30 MG 24 hr tablet Take 1 tablet (30 mg total) by mouth daily. ** DO NOT CRUSH ** 90 tablet 3  . L-Lysine 1000 MG TABS Take 1 tablet by mouth daily.    . metFORMIN (GLUCOPHAGE) 1000 MG tablet Take 1 tablet (1,000 mg total) by mouth 2 (two) times daily with a meal. 180 tablet 1  . Methylsulfonylmethane (MSM PO) Take 6,000 mg by mouth daily.    . metoprolol succinate (TOPROL-XL) 25 MG 24 hr tablet TAKE 1.5 TABLETS (37.5 MG TOTAL) BY MOUTH DAILY. (Patient taking differently: Take 25 mg by mouth daily. ) 135 tablet 3  . Multiple Vitamin (MULTIVITAMIN WITH MINERALS) TABS Take  1 tablet by mouth daily.    . nitroGLYCERIN (NITROSTAT) 0.4 MG SL tablet DISSOLVE 1 TAB UNDER THE TONGUE EVERY 5 MIN AS NEEDED FOR CHEST PAIN  (MAX OF 3) 75 tablet 1  . RELION LANCETS THIN 26G MISC 1 Units by Does not apply route 2 (two) times daily. E 11.9 100 each 12  . rosuvastatin (CRESTOR) 20 MG tablet TAKE 1 TABLET BY MOUTH EVERY DAY 90 tablet 1  . Semaglutide,0.25 or 0.5MG /DOS, (OZEMPIC, 0.25 OR 0.5 MG/DOSE,) 2 MG/1.5ML SOPN Inject 0.5 mg into the skin every Monday. 6 pen 3  . sulfamethoxazole-trimethoprim (BACTRIM DS) 800-160 MG tablet Take 1 tablet by mouth 2 (two) times daily. 14 tablet 0  . valsartan (DIOVAN) 160 MG tablet TAKE 1 TABLET BY MOUTH EVERY DAY 90 tablet 3   Current Facility-Administered Medications on File Prior to Visit  Medication Dose Route Frequency Provider Last Rate Last Admin  . albuterol (PROVENTIL) (2.5 MG/3ML) 0.083% nebulizer solution 2.5 mg  2.5 mg Nebulization Once Rita Ohara, MD       Allergies  Allergen Reactions  . Penicillins Hives     PATIENT HAD A PCN REACTION WITH IMMEDIATE RASH, FACIAL/TONGUE/THROAT SWELLING, SOB, OR LIGHTHEADEDNESS WITH HYPOTENSION:  #  #  #  YES  #  #  #   Has patient had a PCN reaction causing severe rash involving mucus membranes or skin necrosis:Unknown Has patient had a PCN reaction that required hospitalization:No Has patient had a PCN reaction occurring within the last 10 years:No If all of the above answers are "NO", then may proceed with Cephalosporin use.    Family History  Problem Relation Age of Onset  . Diabetes Brother     PE: BP 138/80   Pulse (!) 102   Ht 5\' 4"  (1.626 m)   Wt 186 lb (84.4 kg)   SpO2 97%   BMI 31.93 kg/m  Wt Readings from Last 3 Encounters:  05/09/19 186 lb (84.4 kg)  01/29/19 193 lb (87.5 kg)  12/19/18 195 lb (88.5 kg)   Constitutional: overweight, in NAD Eyes: PERRLA, EOMI, no exophthalmos ENT: moist mucous membranes, no thyromegaly, no cervical lymphadenopathy Cardiovascular: tachycardia, RR, No RG, +2/6 SEM Respiratory: CTA B Gastrointestinal: abdomen soft, NT, ND, BS+ Musculoskeletal: no deformities, strength  intact in all 4 Skin: moist, warm, no rashes Neurological: no tremor with outstretched hands, DTR normal in all 4  ASSESSMENT: 1. DM2, insulin-dependent, uncontrolled, with long-term complications - CAD-s/p AMI, s/p stents, aortic valve stenosis - CKD  2. HL  3.  Obesity class I  PLAN:  1. Patient with longstanding, uncontrolled, type 2 diabetes on oral medication regimen with Metformin, sulfonylurea added at last visit, and also weekly GLP-1 receptor agonist and daily long-acting insulin.  She was telling me that she was happy with her regimen and the fact that we were able to decrease her insulin dose.  Sugars are better at all times of the day but she had some high blood sugars after dinner especially in the second half of the week after the Ozempic started to wear off.  She takes this on Mondays).  She was having some GI discomfort with Ozempic so we did not increase the dose at that time but I advised her to add a low-dose glipizide before larger dinner -We reviewed together the most recent HbA1c which was excellent, at 6.2% earlier this month -However, sugars are still at goal in the morning but they are worse later in the day.  She  now also has some blood sugars after lunch, and they are above target, as are the ones after dinner.  We discussed that this is an indication of not enough mealtime coverage.  At this visit we discussed about possibly increasing the Ozempic dose since she is not tolerating it better.  Another option would be an SGLT 2 inhibitor but she could not want to start this due to the risk of increased urination.  The third option would be mealtime insulin, which we are trying to avoid for now, if possible. -I advised him to increase the Ozempic 1st to 0.75 mg weekly  x2 weeks, then to increase to 1 mg weekly and let me know if she tolerates these well, in which case, I will send a prescription for this dose.  She is preparing to start [facility], which may have some GI  symptoms.  Ideally, we would get her on the target dose of Ozempic before starting this. -Also, since her sugars are dropping too much from evening to morning, I advised her not to use 70 units of Levemir, but only increase to 60 units if needed. - I suggested to:  Patient Instructions  Please continue: - Metformin 1000 mg 2x a day with meals - Glipizide 2.5 to 5 mg before a meal - Levemir 50-60 units at bedtime  Please increase Ozempic to 0.75 mg weekly x 2 weeks, then 1 mg weekly. Let me know when I need to send the new prescription.  Please come back for a follow-up appointment in 4 months.  - advised to check sugars at different times of the day - 2x a day, rotating check times - advised for yearly eye exams >> she is UTD - return to clinic in 4 months  2. HL -Reviewed latest lipid panel from 08/2018: HDL very low, triglycerides very high. Was started on Praluent. Lab Results  Component Value Date   CHOL 143 08/30/2018   HDL 15 (L) 08/30/2018   LDLCALC Comment 08/30/2018   LDLDIRECT 93 11/15/2012   TRIG 598 (HH) 08/30/2018   CHOLHDL 9.5 (H) 08/30/2018  -She continues Crestor 20, fenofibrate, Vascepa, and now Praluent, without side effects  3.  Obesity class I -Before last visit, she lost 10 pounds by improving diet and walking more and she is starting to feel better. -We will continue her GLP-1 receptor agonist, which should also help with weight loss  [name], MD PhD [facility]

## 2019-05-09 NOTE — Patient Instructions (Addendum)
Please continue: - Metformin 1000 mg 2x a day with meals - Glipizide 2.5 to 5 mg before a meal - Levemir 50-60 units at bedtime  Please increase Ozempic to 0.75 mg weekly x 2 weeks, then 1 mg weekly. Let me know when I need to send the new prescription.  Please come back for a follow-up appointment in 4 months.

## 2019-05-21 ENCOUNTER — Other Ambulatory Visit: Payer: Self-pay

## 2019-05-21 ENCOUNTER — Ambulatory Visit: Payer: BC Managed Care – PPO | Admitting: Family Medicine

## 2019-06-04 ENCOUNTER — Telehealth: Payer: Self-pay | Admitting: Family Medicine

## 2019-06-04 DIAGNOSIS — N3000 Acute cystitis without hematuria: Secondary | ICD-10-CM

## 2019-06-04 MED ORDER — SULFAMETHOXAZOLE-TRIMETHOPRIM 800-160 MG PO TABS: 1.0000 | ORAL_TABLET | Freq: Two times a day (BID) | ORAL | 0 refills | Status: DC

## 2019-06-04 NOTE — Telephone Encounter (Signed)
Pt started Sunday. Burning while going. Pt stated that it feels like its burning. Odor in urine and is straw color . Please advise [facility]

## 2019-06-04 NOTE — Telephone Encounter (Signed)
Pt was advised [facility] 

## 2019-06-04 NOTE — Telephone Encounter (Signed)
Pt called and is requesting something states she has a UTI and the diarrhea states her dermatologist has her on Otezla, 30 mg twice a day, pt has been in this medicine for 2 weeks and the diarrhea has just gotten worse, [facility] uses [id number] - [place], [place]

## 2019-06-04 NOTE — Telephone Encounter (Signed)
Let her know that the diarrhea is probably a side effect of her medication and she needs to discuss this with dermatologist.  Certainly she can use some Imodium.  Find out what her UTI symptoms are so we can better document that before I call anything in.

## 2019-06-04 NOTE — Telephone Encounter (Signed)
Let her know that I called in antibiotic and for her.

## 2019-06-06 ENCOUNTER — Other Ambulatory Visit: Payer: Self-pay | Admitting: Cardiovascular Disease

## 2019-06-06 DIAGNOSIS — E1159 Type 2 diabetes mellitus with other circulatory complications: Secondary | ICD-10-CM

## 2019-06-06 DIAGNOSIS — L409 Psoriasis, unspecified: Secondary | ICD-10-CM

## 2019-06-06 DIAGNOSIS — I152 Hypertension secondary to endocrine disorders: Secondary | ICD-10-CM

## 2019-06-06 NOTE — Telephone Encounter (Signed)
Rx has been sent to the pharmacy electronically. ° °

## 2019-06-07 ENCOUNTER — Ambulatory Visit: Payer: BC Managed Care – PPO | Admitting: Cardiovascular Disease

## 2019-06-07 ENCOUNTER — Encounter: Payer: Self-pay | Admitting: Cardiovascular Disease

## 2019-06-07 ENCOUNTER — Other Ambulatory Visit: Payer: Self-pay

## 2019-06-07 VITALS — BP 136/68 | HR 90 | Temp 97.2°F | Ht 64.0 in | Wt 178.0 lb

## 2019-06-07 DIAGNOSIS — E782 Mixed hyperlipidemia: Secondary | ICD-10-CM | POA: Diagnosis not present

## 2019-06-07 DIAGNOSIS — I10 Essential (primary) hypertension: Secondary | ICD-10-CM | POA: Diagnosis not present

## 2019-06-07 DIAGNOSIS — E1169 Type 2 diabetes mellitus with other specified complication: Secondary | ICD-10-CM

## 2019-06-07 DIAGNOSIS — I2510 Atherosclerotic heart disease of native coronary artery without angina pectoris: Secondary | ICD-10-CM

## 2019-06-07 DIAGNOSIS — I350 Nonrheumatic aortic (valve) stenosis: Secondary | ICD-10-CM | POA: Diagnosis not present

## 2019-06-07 DIAGNOSIS — E669 Obesity, unspecified: Secondary | ICD-10-CM

## 2019-06-07 NOTE — Patient Instructions (Signed)
Medication Instructions:  No changes *If you need a refill on your cardiac medications before your next appointment, please call your pharmacy*  Lab Work: Your provider would like for you to return in the next few weeks to have the following labs drawn: fasting Lipid. You do not need an appointment for the lab. Once in our office lobby there is a podium where you can sign in and ring the doorbell to alert [place] that you are here. The lab is open from 8:00 am to 4:30 pm; closed for lunch from 12:45pm-1:45pm.  If you have labs (blood work) drawn today and your tests are completely normal, you will receive your results only by: Marland Kitchen MyChart Message (if you have MyChart) OR . A paper copy in the mail If you have any lab test that is abnormal or we need to change your treatment, we will call you to review the results.  Testing/Procedures: Your physician has requested that you have an echocardiogram in June 2021. Echocardiography is a painless test that uses sound waves to create images of your heart. It provides your doctor with information about the size and shape of your heart and how well your heart's chambers and valves are working. You may receive an ultrasound enhancing agent through an IV if needed to better visualize your heart during the echo.This procedure takes approximately one hour. There are no restrictions for this procedure. This will take place at the [address].    Follow-Up: At [facility], you and your health needs are our priority.  As part of our continuing mission to provide you with exceptional heart care, we have created designated Provider Care Teams.  These Care Teams include your primary Cardiologist (physician) and Advanced Practice Providers (APPs -  Physician Assistants and Nurse Practitioners) who all work together to provide you with the care you need, when you need it.  Your next appointment:   5 month(s)  The format for your next appointment:   In  Person  Provider:   You may see Sanda Klein, MD or one of the following Advanced Practice Providers on your designated Care Team:    Almyra Deforest, PA-C  Fabian Sharp, PA-C or   Roby Lofts, [place]

## 2019-06-07 NOTE — Progress Notes (Signed)
Patient ID: Haley George, female   DOB: [birth year], 64 y.o.   MRN: [id number]    Cardiology Office Note    Date:  06/08/2019   ID:  Haley George, DOB 20-Dec-1955, MRN [id number]  PCP:  Denita Lung, MD  Cardiologist:   Sanda Klein, MD   Chief Complaint  Patient presents with  . Coronary Artery Disease    History of Present Illness:  Haley George is a 64 y.o. female with diabetes mellitus, severe mixed hyperlipidemia, previous percutaneous revascularization procedures performed in all 3 major coronary arteries and restenosis requiring repeat PCI.  Most recent procedure was Cutting Balloon angioplasty and placement of a new drug-eluting stent in the mid LAD artery (synergy 2.512) on November 13, 2018.  For the most part, the restenosis was treated with cutting balloon, with stent placed only in the proximal portion of restenosis to avoid along the area of 3 stent layers. At the time of cardiac catheterization note was made of very mild aortic stenosis and normal left heart filling pressure. Echo June 2018 showed normal left ventricular systolic function, EF 123456 and mild diastolic dysfunction without elevated filling pressures.    Since then she has started treatment with Praluent in September 2020 and has tolerated this well.  She has not yet had repeat lipid profile check.  Globin A1c at her last visit with Dr. Ethelene George in January was excellent at 6.2%.  In early January 2021 she began experiencing nausea and vomiting.  This lasted for 2 weeks and at times associated diarrhea.  Not long after that she started treatment with [place] Nail (for autoimmune hepatitis?)  And has noticed that the nausea worsens after each dose.  The third week in January she was also started on Ozempic.  He continues to have nausea, although the vomiting has subsided somewhat.  She is taking probiotics.  She was diagnosed with a urinary tract infection and given Bactrim.  Ultrasonography showed a small gallstone without  any evidence of biliary obstruction or cholecystitis.  I do not have access to all her labs it appears that her liver function tests were normal.  I am not sure if her lipase was checked.  She has lost 15 pounds since last November.  The patient specifically denies any chest pain at rest exertion, dyspnea at rest or with exertion, orthopnea, paroxysmal nocturnal dyspnea, syncope, palpitations, focal neurological deficits, intermittent claudication, lower extremity edema, unexplained weight gain, cough, hemoptysis or wheezing.   Previous coronary interventions 2007  - mid LAD 2.2512 mini vision - left circumflex 2.518 Cypher  - RCA 3.023 Cypher  2010  - mid LAD 2.2512 Taxus overlapping 2.012 mini vision, overlapping another 2.012 mini vision -  left circumflex 2.516 Taxus upstream of previous stent.  2020  - mid LAD Synergy 2.5 x12 , overlapping the proximal area of restenosis in the mid LAD (short area of 3 stent layers).  She had a normal nuclear stress test in November 2016. EF was 56%. She had a "false positive" ECG response.    Past Medical History:  Diagnosis Date  . Asthma   . Cancer ([place])    basal cell chest  . Complication of anesthesia   . Coronary artery disease   . Depression   . Diabetes mellitus without complication ([place])    type 2  . Heart murmur   . Hepatitis    auto- immune  . History of kidney stones   . History of nuclear stress test 05/21/2010  dipyridamole; normal perfusion, preserved LV systolic EF of [id number]  . Hyperlipidemia   . Hypertension   . Liver failure ([place])    h/o autoimmune hepatitis   . Myocardial infarction ([place])   . Neuropathy    tingling and burning left leg to foot  . Obesity   . PONV (postoperative nausea and vomiting)   . Psoriasis   . Tubular adenoma of colon    colon path 01/09/2017    Past Surgical History:  Procedure Laterality Date  . Carotid Doppler  10/2008   R & L ICAs 0-49% diameter reduction   . COLONOSCOPY    .  CORONARY ANGIOPLASTY WITH STENT PLACEMENT  09/2008   2.25x62mm Cypher DES to in-stent restenosis of prox LAD; Mini-Vision stent x2 2.0x3mm to area distal of initial LAD stent; 3 2.5x67mm Taxus stents prox to Cypher stent in circumflex  . CORONARY ANGIOPLASTY WITH STENT PLACEMENT  09/2005   Cypher DES 3.0x13mm to distal RCA; 2.25x58mm Mini0Vision stent to mid LAD; 2.5x31mm Cypher stent to circumflex  . CORONARY STENT INTERVENTION N/A 11/13/2018   Procedure: CORONARY STENT INTERVENTION;  Surgeon: Jettie Booze, MD;  Location: [facility];  Service: Cardiovascular;  Laterality: N/A;  . LEFT HEART CATH AND CORONARY ANGIOGRAPHY N/A 11/13/2018   Procedure: LEFT HEART CATH AND CORONARY ANGIOGRAPHY;  Surgeon: Jettie Booze, MD;  Location: [facility];  Service: Cardiovascular;  Laterality: N/A;  . LUMBAR LAMINECTOMY/DECOMPRESSION MICRODISCECTOMY Left 10/18/2016   Procedure: Left Lumbar Four-Five Microdiscectomy;  Surgeon: Erline Levine, MD;  Location: [facility];  Service: Neurosurgery;  Laterality: Left;  Left L4-5 Microdiscectomy  . ROTATOR CUFF REPAIR Left   . SHOULDER ARTHROSCOPY W/ ROTATOR CUFF REPAIR Right   . TRANSTHORACIC ECHOCARDIOGRAM  04/2010   EF=>55%, borderline conc LVH; trace MR & TR; AV mildly sclerotic; aortic root sclerosis/calcif  . ureteral stents      Outpatient Medications Prior to Visit  Medication Sig Dispense Refill  . Alirocumab (PRALUENT) 150 MG/ML SOAJ Inject 150 mg into the skin every 14 (fourteen) days. 2 pen 6  . Ascorbic Acid (VITAMIN C PO) Take 5,000 mg by mouth daily.     Marland Kitchen aspirin EC 81 MG tablet Take 81 mg by mouth at bedtime.    Marland Kitchen BREO ELLIPTA 200-25 MCG/INH AEPB TAKE 1 PUFF BY MOUTH EVERY DAY    . clopidogrel (PLAVIX) 75 MG tablet TAKE 1 TABLET (75 MG TOTAL) BY MOUTH DAILY WITH BREAKFAST. 90 tablet 1  . Coenzyme Q10 300 MG CAPS Take 300 mg by mouth daily.    . Cranberry (SM CRANBERRY) 300 MG tablet Take 300 mg by mouth every evening.    .  fenofibrate (TRICOR) 145 MG tablet Take 1 tablet (145 mg total) by mouth every evening. 90 tablet 1  . glipiZIDE (GLUCOTROL) 5 MG tablet Take 0.5-1 tablets (2.5-5 mg total) by mouth daily before supper. 60 tablet 3  . glucose blood test strip Pt uses relion meter- test bid for glucose montioring E11.9 Please dispense relion test strips. 100 each 12  . icosapent Ethyl (VASCEPA) 1 g capsule Take 2 capsules (2 g total) by mouth 2 (two) times daily. 360 capsule 3  . Insulin Detemir (LEVEMIR FLEXTOUCH) 100 UNIT/ML Pen Inject 50 Units into the skin every evening. 45 pen 3  . Insulin Pen Needle (BD PEN NEEDLE NANO U/F) 32G X 4 MM MISC Use as directed for insulin administration. 100 each 3  . isosorbide mononitrate (IMDUR) 30 MG 24 hr tablet  Take 1 tablet (30 mg total) by mouth daily. ** DO NOT CRUSH ** 90 tablet 3  . metFORMIN (GLUCOPHAGE) 1000 MG tablet Take 1 tablet (1,000 mg total) by mouth 2 (two) times daily with a meal. 180 tablet 1  . metoprolol succinate (TOPROL-XL) 25 MG 24 hr tablet TAKE 1.5 TABLETS (37.5 MG TOTAL) BY MOUTH DAILY. 135 tablet 1  . Multiple Vitamin (MULTIVITAMIN WITH MINERALS) TABS Take 1 tablet by mouth daily.    . nitroGLYCERIN (NITROSTAT) 0.4 MG SL tablet DISSOLVE 1 TAB UNDER THE TONGUE EVERY 5 MIN AS NEEDED FOR CHEST PAIN (MAX OF 3) 75 tablet 1  . ondansetron (ZOFRAN) 4 MG tablet SMARTSIG:1 Tablet(s) By Mouth Every 12 Hours    . OTEZLA 30 MG TABS Take 1 tablet by mouth 2 (two) times daily.    Marland Kitchen RELION LANCETS THIN 26G MISC 1 Units by Does not apply route 2 (two) times daily. E 11.9 100 each 12  . rosuvastatin (CRESTOR) 20 MG tablet TAKE 1 TABLET BY MOUTH EVERY DAY 90 tablet 1  . Semaglutide,0.25 or 0.5MG /DOS, (OZEMPIC, 0.25 OR 0.5 MG/DOSE,) 2 MG/1.5ML SOPN Inject 0.5 mg into the skin every Monday. 6 pen 3  . sulfamethoxazole-trimethoprim (BACTRIM DS) 800-160 MG tablet Take 1 tablet by mouth 2 (two) times daily. 14 tablet 0  . triamcinolone (KENALOG) 0.025 % ointment Apply 1  application topically 2 (two) times daily.    . valsartan (DIOVAN) 160 MG tablet TAKE 1 TABLET BY MOUTH EVERY DAY 90 tablet 3  . acetaZOLAMIDE (DIAMOX) 250 MG tablet Take 1 tablet (250 mg total) by mouth daily. 10 tablet 0  . L-Lysine 1000 MG TABS Take 1 tablet by mouth daily.    . Methylsulfonylmethane (MSM PO) Take 6,000 mg by mouth daily.     Facility-Administered Medications Prior to Visit  Medication Dose Route Frequency Provider Last Rate Last Admin  . albuterol (PROVENTIL) (2.5 MG/3ML) 0.083% nebulizer solution 2.5 mg  2.5 mg Nebulization Once Rita Ohara, MD         Allergies:   Penicillins   Social History   Socioeconomic History  . Marital status: Married    Spouse name: Not on file  . Number of children: 1  . Years of education: Not on file  . Highest education level: Not on file  Occupational History    Employer: [employer]  Tobacco Use  . Smoking status: Never Smoker  . Smokeless tobacco: Never Used  Substance and Sexual Activity  . Alcohol use: No  . Drug use: No  . Sexual activity: Not Currently  Other Topics Concern  . Not on file  Social History Narrative  . Not on file   Social Determinants of Health   Financial Resource Strain: Low Risk   . Difficulty of Paying Living Expenses: Not very hard  Food Insecurity: No Food Insecurity  . Worried About Charity fundraiser in the Last Year: Never true  . Ran Out of Food in the Last Year: Never true  Transportation Needs: No Transportation Needs  . Lack of Transportation (Medical): No  . Lack of Transportation (Non-Medical): No  Physical Activity: Unknown  . Days of Exercise per Week: 2 days  . Minutes of Exercise per Session: Not on file  Stress: No Stress Concern Present  . Feeling of Stress : Only a little  Social Connections: Unknown  . Frequency of Communication with Friends and Family: Three times a week  . Frequency of Social Gatherings with Friends  and Family: Not on file  .  Attends Religious Services: Not on file  . Active Member of Clubs or Organizations: Yes  . Attends Archivist Meetings: Not on file  . Marital Status: Married     ROS:   Please see the history of present illness.    ROS  All other systems are reviewed and are negative  PHYSICAL EXAM:   VS:  BP 136/68   Pulse 90   Temp (!) 97.2 F (36.2 C)   Ht 5\' 4"  (1.626 m)   Wt 178 lb (80.7 kg)   SpO2 96%   BMI 30.55 kg/m      General: Alert, oriented x3, no distress, mildly obese Head: no evidence of trauma, PERRL, EOMI, no exophtalmos or lid lag, no myxedema, no xanthelasma; normal ears, nose and oropharynx Neck: normal jugular venous pulsations and no hepatojugular reflux; brisk carotid pulses without delay and no carotid bruits Chest: clear to auscultation, no signs of consolidation by percussion or palpation, normal fremitus, symmetrical and full respiratory excursions Cardiovascular: normal position and quality of the apical impulse, regular rhythm, normal first and second heart sounds, 2/6 aortic ejection murmur appears to still be early peaking no diastolic murmurs, rubs or gallops Abdomen: no tenderness or distention, no masses by palpation, no abnormal pulsatility or arterial bruits, normal bowel sounds, no hepatosplenomegaly Extremities: no clubbing, cyanosis or edema; 2+ radial, ulnar and brachial pulses bilaterally; 2+ right femoral, posterior tibial and dorsalis pedis pulses; 2+ left femoral, posterior tibial and dorsalis pedis pulses; no subclavian or femoral bruits Neurological: grossly nonfocal Psych: Normal mood and affect   Wt Readings from Last 3 Encounters:  06/07/19 178 lb (80.7 kg)  05/09/19 186 lb (84.4 kg)  01/29/19 193 lb (87.5 kg)    Studies/Labs Reviewed:   EKG:  EKG is ordered today.  As before, it shows sinus rhythm and is a completely normal tracing.  QTc 438 ms. Recent Labs: 11/14/2018: Hemoglobin 10.6; Platelets 173 11/19/2018: ALT 30; Magnesium  1.7 11/27/2018: BUN 20; Creatinine, Ser 1.12; Potassium 5.2; Sodium 139   Lipid Panel    Component Value Date/Time   CHOL 143 08/30/2018 0850   TRIG 598 (HH) 08/30/2018 0850   HDL 15 (L) 08/30/2018 0850   CHOLHDL 9.5 (H) 08/30/2018 0850   CHOLHDL 5.1 (H) 07/12/2016 1413   VLDL 50 (H) 07/12/2016 1413   [place] Comment 08/30/2018 0850   LDLDIRECT 93 11/15/2012 0829    ASSESSMENT:    1. Mixed hyperlipidemia   2. Aortic valve stenosis, nonrheumatic      PLAN:  In order of problems listed above:  1. CAD: Asymptomatic since July 2020 when she had another stent in the mid LAD artery.  She has had progression of coronary disease despite aggressive treatment with 3 oral agents and was started on Praluent last September.  To continue dual antiplatelet therapy through July 2021. 2. HLP: She has a very disadvantageous lipid profile with high cholesterol, very low HDL, severely elevated triglycerides.  Started on Praluent about 4 months ago.  Still on fenofibrate.  Ordered a repeat lipid profile.  Hopefully her triglycerides have not gone up which would put her at risk for pancreatitis. 3. AS: Mildly increased gradients at the time of cardiac catheterization, will follow up with an echocardiogram later this year, since the murmur is definitely louder than I remember it. 4. HTN: Fair control.  Target 130/80 or less. 5. DM: Excellent control.  Medications are being meticulously adjusted by Dr.  Gherghe. 6. Obesity: She ha lost a lot of weight recently, due to her problems with nausea, for which I am not sure I have a good explanation. 7. Reactive airway disease: No recent problems with wheezing, but be cautious with higher doses of beta-blockers.     Medication Adjustments/Labs and Tests Ordered: Current medicines are reviewed at length with the patient today.  Concerns regarding medicines are outlined above.  Medication changes, Labs and Tests ordered today are listed in the Patient Instructions  below. Patient Instructions  Medication Instructions:  No changes *If you need a refill on your cardiac medications before your next appointment, please call your pharmacy*  Lab Work: Your provider would like for you to return in the next few weeks to have the following labs drawn: fasting Lipid. You do not need an appointment for the lab. Once in our office lobby there is a podium where you can sign in and ring the doorbell to alert [place] that you are here. The lab is open from 8:00 am to 4:30 pm; closed for lunch from 12:45pm-1:45pm.  If you have labs (blood work) drawn today and your tests are completely normal, you will receive your results only by: Marland Kitchen MyChart Message (if you have MyChart) OR . A paper copy in the mail If you have any lab test that is abnormal or we need to change your treatment, we will call you to review the results.  Testing/Procedures: Your physician has requested that you have an echocardiogram in June 2021. Echocardiography is a painless test that uses sound waves to create images of your heart. It provides your doctor with information about the size and shape of your heart and how well your heart's chambers and valves are working. You may receive an ultrasound enhancing agent through an IV if needed to better visualize your heart during the echo.This procedure takes approximately one hour. There are no restrictions for this procedure. This will take place at the [address].    Follow-Up: At [facility], you and your health needs are our priority.  As part of our continuing mission to provide you with exceptional heart care, we have created designated Provider Care Teams.  These Care Teams include your primary Cardiologist (physician) and Advanced Practice Providers (APPs -  Physician Assistants and Nurse Practitioners) who all work together to provide you with the care you need, when you need it.  Your next appointment:   5 month(s)  The format for  your next appointment:   In Person  Provider:   You may see Sanda Klein, MD or one of the following Advanced Practice Providers on your designated Care Team:    Almyra Deforest, PA-C  Fabian Sharp, [place] or   Roby Lofts, PA-C        Signed, Sanda Klein, MD  06/08/2019 4:38 PM    [address], [place], [place]  [address] Phone: [phone]; Fax: [phone]

## 2019-06-18 ENCOUNTER — Ambulatory Visit: Payer: BC Managed Care – PPO | Admitting: Family Medicine

## 2019-06-18 ENCOUNTER — Telehealth: Payer: Self-pay | Admitting: Internal Medicine

## 2019-06-18 ENCOUNTER — Other Ambulatory Visit: Payer: Self-pay

## 2019-06-18 ENCOUNTER — Encounter: Payer: Self-pay | Admitting: Family Medicine

## 2019-06-18 VITALS — BP 118/72 | HR 99 | Temp 98.2°F | Wt 176.4 lb

## 2019-06-18 DIAGNOSIS — M79662 Pain in left lower leg: Secondary | ICD-10-CM

## 2019-06-18 DIAGNOSIS — R399 Unspecified symptoms and signs involving the genitourinary system: Secondary | ICD-10-CM

## 2019-06-18 DIAGNOSIS — N3000 Acute cystitis without hematuria: Secondary | ICD-10-CM | POA: Diagnosis not present

## 2019-06-18 LAB — POCT URINALYSIS DIP (PROADVANTAGE DEVICE)
Bilirubin, UA: NEGATIVE
Glucose, UA: NEGATIVE mg/dL
Nitrite, UA: NEGATIVE
Protein Ur, POC: 100 mg/dL — AB
Specific Gravity, Urine: 1.03
Urobilinogen, Ur: 0.2
pH, UA: 6 (ref 5.0–8.0)

## 2019-06-18 MED ORDER — NITROFURANTOIN MONOHYD MACRO 100 MG PO CAPS: 100.0000 mg | ORAL_CAPSULE | Freq: Two times a day (BID) | ORAL | 0 refills | Status: DC

## 2019-06-18 NOTE — Progress Notes (Signed)
   Subjective:    Patient ID: Haley George, female    DOB: April 22, 1956, 64 y.o.   MRN: [id number]  HPI She was treated on February 9 for a UTI with Septra and states that she really did not improve much on that.  She has a previous history of difficulty with UTI that did respond to Septra.  Approximately 5 days ago her frequency, urgency and dysuria got worse.  No fever, chills or abdominal pain. She also complains of a several day history of left calf pain but no history of injury, sitting for long periods of time or any surgeries.   Review of Systems     Objective:   Physical Exam Alert and in no distress.  Urine dipstick was positive for blood and leukocytes. Left calf exam shows slight tenderness in the mid calf area but negative Homans' sign.  Skin is normal.  Pain is made worse with plantar flexion especially in the left gastroc belly.      Assessment & Plan:  UTI symptoms - Plan: POCT Urinalysis DIP (Proadvantage Device)  Acute cystitis without hematuria - Plan: nitrofurantoin, macrocrystal-monohydrate, (MACROBID) 100 MG capsule, Urine Culture  Pain of left calf I explained that I did not think she had a DVT.  Recommended heat and pain med of choice. I did switch to Macrobid and will wait on the culture results.

## 2019-06-18 NOTE — Telephone Encounter (Signed)
Patient called re: patient's insurance now covers diabetic supplies. Patient states she must have RX's sent to [facility] in order for the supplies to be covered by insurance. Patient requests that new RX's for the following diabetic supplies:  MEDICATION: Insulin Pen Needle (BD PEN NEEDLE NANO U/F) 32G X 4 MM MISC, glucose blood test strip for Relion-204 test strips per month, RELION LANCETS THIN 26G MISC, Alcohol swabs, AND BD Sharps container  PHARMACY:   [id number] [place], [place] - [address] Phone:  [phone]  Fax:  [phone]       IS THIS A 90 DAY SUPPLY : ?  IS PATIENT OUT OF MEDICATION: ?  IF NOT; HOW MUCH IS LEFT: ?  LAST APPOINTMENT DATE: @1 /14/2021  NEXT APPOINTMENT DATE:@5 /25/2021  DO WE HAVE YOUR PERMISSION TO LEAVE A DETAILED MESSAGE: Yes on ph# [phone]  OTHER COMMENTS:    **Let patient know to contact pharmacy at the end of the day to make sure medication is ready. **  ** Please notify patient to allow 48-72 hours to process**  **Encourage patient to contact the pharmacy for refills or they can request refills through [facility]**

## 2019-06-19 MED ORDER — BD PEN NEEDLE NANO U/F 32G X 4 MM MISC: 3 refills | Status: DC

## 2019-06-19 MED ORDER — SHARPS CONTAINER MISC: 2 refills | Status: DC

## 2019-06-19 MED ORDER — RELION LANCETS THIN 26G MISC: 1.0000 [IU] | Freq: Two times a day (BID) | 12 refills | Status: DC

## 2019-06-19 MED ORDER — GLUCOSE BLOOD VI STRP: ORAL_STRIP | 12 refills | Status: DC

## 2019-06-19 NOTE — Telephone Encounter (Signed)
All have been sent.

## 2019-06-20 LAB — URINE CULTURE

## 2019-06-26 ENCOUNTER — Telehealth: Payer: Self-pay | Admitting: Internal Medicine

## 2019-06-26 MED ORDER — RELION LANCETS THIN 26G MISC: 1.0000 [IU] | Freq: Two times a day (BID) | 12 refills | Status: AC

## 2019-06-26 MED ORDER — GLUCOSE BLOOD VI STRP: ORAL_STRIP | 12 refills | Status: DC

## 2019-06-26 NOTE — Telephone Encounter (Signed)
[facility] [address] [facility] to advise that they did not get the RX for test strips and Lancets.  They did receive the other items.  Pharmacist request that we resend the RX for test strips and Lancets

## 2019-06-26 NOTE — Telephone Encounter (Signed)
Patient calling stating her pharmacy never received the prescriptions for glucose test strips, Insulin Pen needle, and ReliOn Lancets and she also is asking for Alcohol wipes.   [facility]  [id number] [place], [place] - [address]  [place], [place] [address]  Phone:  [phone] Fax:  [phone]    Patient ph# [phone]

## 2019-06-26 NOTE — Telephone Encounter (Signed)
They have been sent again.

## 2019-06-26 NOTE — Telephone Encounter (Signed)
These were all sent on 06/19/2019 with confirmation the pharmacy received it.  I called patient and left her a VM notifying her of this and advised she talk to her pharmacy. Patient to call back it they tell her they can't find the scripts.

## 2019-06-26 NOTE — Addendum Note (Signed)
Addended by: Cardell Peach I on: 06/26/2019 01:36 PM   Modules accepted: Orders

## 2019-06-27 ENCOUNTER — Other Ambulatory Visit: Payer: Self-pay

## 2019-06-27 MED ORDER — ONETOUCH VERIO W/DEVICE KIT: PACK | 0 refills | Status: DC

## 2019-06-27 MED ORDER — ONETOUCH VERIO VI STRP: ORAL_STRIP | 12 refills | Status: AC

## 2019-06-28 ENCOUNTER — Other Ambulatory Visit: Payer: Self-pay | Admitting: Cardiovascular Disease

## 2019-07-11 ENCOUNTER — Ambulatory Visit: Payer: BC Managed Care – PPO | Attending: Internal Medicine

## 2019-07-11 DIAGNOSIS — Z23 Encounter for immunization: Secondary | ICD-10-CM

## 2019-07-11 NOTE — Progress Notes (Signed)
   Covid-19 Vaccination Clinic  Name:  Haley George    MRN: [id number] DOB: Jan 29, 1956  07/11/2019  Ms. Shanahan was observed post Covid-19 immunization for 30 minutes based on pre-vaccination screening without incident. She was provided with Vaccine Information Sheet and instruction to access the V-Safe system.   Ms. Balderaz was instructed to call 911 with any severe reactions post vaccine: Marland Kitchen Difficulty breathing  . Swelling of face and throat  . A fast heartbeat  . A bad rash all over body  . Dizziness and weakness   Immunizations Administered    Name Date Dose VIS Date Route   Pfizer COVID-19 Vaccine 07/11/2019 10:31 AM 0.3 mL 04/05/2019 Intramuscular   Manufacturer: [place]   Lot: [id number]   [place]: [id number]

## 2019-07-16 ENCOUNTER — Telehealth: Payer: Self-pay | Admitting: Cardiovascular Disease

## 2019-07-16 DIAGNOSIS — L409 Psoriasis, unspecified: Secondary | ICD-10-CM

## 2019-07-16 DIAGNOSIS — E1159 Type 2 diabetes mellitus with other circulatory complications: Secondary | ICD-10-CM

## 2019-07-16 NOTE — Telephone Encounter (Signed)
*  STAT* If patient is at the pharmacy, call can be transferred to refill team.   1. Which medications need to be refilled? (please list name of each medication and dose if known)  metoprolol succinate (TOPROL-XL) 25 MG 24 hr tablet Alirocumab (PRALUENT) 150 MG/ML SOAJ  2. Which pharmacy/location (including street and city if local pharmacy) is medication to be sent to? [id number] [place], [place]   3. Do they need a 30 day or 90 day supply? 71  Pt states she gets the Alirocumab (PRALUENT) 150 MG/ML SOAJ right now for 1 month at a time, but  she was hoping Dr. Loletha Grayer could write the rx for 3 months. The pt says her pharmacy has a hard time getting the medication in stock and she has been late giving herself the injections for this reason.

## 2019-07-17 MED ORDER — PRALUENT 150 MG/ML ~~LOC~~ SOAJ: 150.0000 mg | SUBCUTANEOUS | 3 refills | Status: DC

## 2019-07-17 NOTE — Telephone Encounter (Signed)
3 month Praluent rx sent in, refills team to send in metoprolol if needed.

## 2019-07-22 MED ORDER — METOPROLOL SUCCINATE ER 25 MG PO TB24: 37.5000 mg | ORAL_TABLET | Freq: Every day | ORAL | 3 refills | Status: DC

## 2019-07-22 NOTE — Telephone Encounter (Signed)
[facility]

## 2019-08-05 ENCOUNTER — Ambulatory Visit: Payer: BC Managed Care – PPO | Attending: Internal Medicine

## 2019-08-05 DIAGNOSIS — Z23 Encounter for immunization: Secondary | ICD-10-CM

## 2019-08-05 NOTE — Progress Notes (Signed)
   Covid-19 Vaccination Clinic  Name:  Haley George    MRN: [id number] DOB: 05/20/1955  08/05/2019  [name] was observed post Covid-19 immunization for 15 minutes without incident. She was provided with Vaccine Information Sheet and instruction to access the V-Safe system.   [name] was instructed to call 911 with any severe reactions post vaccine: Marland Kitchen Difficulty breathing  . Swelling of face and throat  . A fast heartbeat  . A bad rash all over body  . Dizziness and weakness   Immunizations Administered    Name Date Dose VIS Date Route   Pfizer COVID-19 Vaccine 08/05/2019  9:51 AM 0.3 mL 04/05/2019 Intramuscular   Manufacturer: [place]   Lot: [id number]   [place]: [id number]

## 2019-08-10 ENCOUNTER — Other Ambulatory Visit: Payer: Self-pay | Admitting: Internal Medicine

## 2019-09-17 ENCOUNTER — Ambulatory Visit: Payer: BC Managed Care – PPO | Admitting: Internal Medicine

## 2019-10-13 ENCOUNTER — Other Ambulatory Visit: Payer: Self-pay | Admitting: Cardiovascular Disease

## 2019-10-13 DIAGNOSIS — E785 Hyperlipidemia, unspecified: Secondary | ICD-10-CM

## 2019-10-22 ENCOUNTER — Other Ambulatory Visit: Payer: Self-pay | Admitting: *Deleted

## 2019-10-22 DIAGNOSIS — E1169 Type 2 diabetes mellitus with other specified complication: Secondary | ICD-10-CM

## 2019-10-22 LAB — LIPID PANEL
Chol/HDL Ratio: 3 ratio (ref 0.0–4.4)
Cholesterol, Total: 59 mg/dL — ABNORMAL LOW (ref 100–199)
HDL: 20 mg/dL — ABNORMAL LOW (ref >39)
LDL Chol Calc (NIH): 8 mg/dL (ref 0–99)
Triglycerides: 194 mg/dL — ABNORMAL HIGH (ref 0–149)
VLDL Cholesterol Cal: 31 mg/dL (ref 5–40)

## 2019-10-23 ENCOUNTER — Other Ambulatory Visit (HOSPITAL_COMMUNITY): Payer: BC Managed Care – PPO

## 2019-10-23 LAB — HEMOGLOBIN A1C
Est. average glucose Bld gHb Est-mCnc: 137 mg/dL
Hgb A1c MFr Bld: 6.4 % — ABNORMAL HIGH (ref 4.8–5.6)

## 2019-10-30 ENCOUNTER — Ambulatory Visit (HOSPITAL_COMMUNITY): Payer: BC Managed Care – PPO | Attending: Cardiology

## 2019-10-30 ENCOUNTER — Other Ambulatory Visit: Payer: Self-pay

## 2019-10-30 DIAGNOSIS — I350 Nonrheumatic aortic (valve) stenosis: Secondary | ICD-10-CM | POA: Insufficient documentation

## 2019-10-31 ENCOUNTER — Ambulatory Visit: Payer: BC Managed Care – PPO | Admitting: Internal Medicine

## 2019-10-31 ENCOUNTER — Encounter: Payer: Self-pay | Admitting: Internal Medicine

## 2019-10-31 VITALS — BP 142/96 | HR 68 | Ht 64.0 in | Wt 181.0 lb

## 2019-10-31 DIAGNOSIS — E1169 Type 2 diabetes mellitus with other specified complication: Secondary | ICD-10-CM | POA: Diagnosis not present

## 2019-10-31 DIAGNOSIS — Z6833 Body mass index (BMI) 33.0-33.9, adult: Secondary | ICD-10-CM

## 2019-10-31 DIAGNOSIS — Z794 Long term (current) use of insulin: Secondary | ICD-10-CM | POA: Diagnosis not present

## 2019-10-31 DIAGNOSIS — E785 Hyperlipidemia, unspecified: Secondary | ICD-10-CM | POA: Diagnosis not present

## 2019-10-31 DIAGNOSIS — E669 Obesity, unspecified: Secondary | ICD-10-CM | POA: Diagnosis not present

## 2019-10-31 NOTE — Patient Instructions (Signed)
Please continue: - Metformin 1000 mg 2x a day with meals - Glipizide 2.5 to 5 mg before a large meal - Ozempic 0.5 mg weekly - Levemir 50 units at bedtime  Please come back for a follow-up appointment in 4 months.

## 2019-10-31 NOTE — Progress Notes (Signed)
Patient ID: Haley George, female   DOB: May 11, 1955, 64 y.o.   MRN: [id number]   This visit occurred during the SARS-CoV-2 public health emergency.  Safety protocols were in place, including screening questions prior to the visit, additional usage of staff PPE, and extensive cleaning of exam room while observing appropriate contact time as indicated for disinfecting solutions.   HPI: Haley George is a 64 y.o.-year-old female, initially referred by her PCP, Dr. Redmond School, returning for follow-up for DM2, dx initially in 1997 2/2 liver failure, then GDM, then re-occuring in 2007, insulin-dependent, uncontrolled, with long-term complications (CAD-s/p AMI, s/p stents, aortic valve stenosis; CKD).  Last visit 6 months ago.  Before last visit, she had 2 steroid injections >> her sugars were higher then.  Since last visit, she started on Otezla for her autoimmune hepatitis >> she could not tolerate it >> lost 24 lbs >> had to stop.  After her weight loss, approximately 1.5 months ago, she started to improve her food portions and sugars remain controlled.  Reviewed her HbA1c levels: Lab Results  Component Value Date   HGBA1C 6.4 (H) 10/22/2019   HGBA1C 6.3 (A) 01/29/2019   HGBA1C 9.2 (A) 08/30/2018   HGBA1C 8.8 (A) 03/27/2018   HGBA1C 9.2 (A) 12/19/2017   HGBA1C 7.3 03/13/2017   HGBA1C 10.5 (H) 10/18/2016   HGBA1C 7.1 07/12/2016   HGBA1C 6.8 02/10/2015   HGBA1C 7.9 10/02/2014  05/01/2019: HbA1c 6.2% She was on steroid injections in 2019.  Pt is on a regimen of: - Metformin 1000 mg 2x a day, with meals - Glipizide 2.5-5 mg before a meal - added 01/2019 - forgets it! - Levemir 140 (70 units x2) units at bedtime >> 70 units >> 50 units at bedtime   - Ozempic 0.5 mg weekly-added 09/2018  - did not increase the dose since last OV She was on Actos before.  Pt checks her sugars 2-3 times a day - in the last 1.5 months: - am: 128-200 >> 93-110 >> 91-126 >> 79, 81-101 - 2h after b'fast: n/c >>  129-148 - before lunch: n/c - 2h after lunch: n/c >> 180-200  >> 138-148 - before dinner: n/c - 2h after dinner: up to 300 >> 150-220 >> 180-220 >> 132-161, 168 - bedtime: n/c - nighttime: n/c Lowest sugar was 40 - 2012 >> ... >> 93 >> 91 >> 79; she has hypoglycemia awareness in the 70s. Highest sugar was 368 >> 220 >> 220 >> 203.  Glucometer: ReliOn  Pt's meals are: - Breakfast: if not skipping - egg + whole wheat toast; cheerios + milk - Lunch: chicken salad, apple, veggies, trisquits  - Dinner: meat + 2 veggies +/- rolls  -+ CKD, last BUN/creatinine:  04/16/2019: BUN/creatinine 21/1.17, GFR 50, alkaline phosphatase 31 (37-153) Lab Results  Component Value Date   BUN 20 11/27/2018   BUN 21 11/19/2018   CREATININE 1.12 (H) 11/27/2018   CREATININE 1.17 (H) 11/19/2018  On Diovan 160.  -+ HL; last set of lipids: Lab Results  Component Value Date   CHOL 59 (L) 10/22/2019   HDL 20 (L) 10/22/2019   LDLCALC 8 10/22/2019   LDLDIRECT 93 11/15/2012   TRIG 194 (H) 10/22/2019   CHOLHDL 3.0 10/22/2019  On Crestor 20, fenofibrate, Vascepa.  She started Praluent 12/2018.  - last eye exam was in 07/2018: No DR reportedly. Dr. Katy Fitch.  - no numbness and tingling in her feet.  Pt has FH of DM in M, M uncle, MGM.  She was admitted for chest pain 10/2018 after an episode of dehydration and had heart catheterization at that time >> has another stent and the previous stent was recanalized.  She has a history of autoimmune hepatitis. Also, psoriasis, diagnosed as a child.  She lost a signif. amount of weight on [employer] in 2018.    ROS: Constitutional: no weight gain/+ weight loss, no fatigue, no subjective hyperthermia, no subjective hypothermia Eyes: no blurry vision, no xerophthalmia ENT: no sore throat, no nodules palpated in neck, no dysphagia, no odynophagia, no hoarseness Cardiovascular: no CP/no SOB/no palpitations/no leg swelling Respiratory: no cough/no SOB/no  wheezing Gastrointestinal: no N/no V/no D/no C/no acid reflux Musculoskeletal: no muscle aches/no joint aches Skin: no rashes, no hair loss Neurological: no tremors/no numbness/no tingling/no dizziness  I reviewed pt's medications, allergies, PMH, social hx, family hx, and changes were documented in the history of present illness. Otherwise, unchanged from my initial visit note.  Past Medical History:  Diagnosis Date  . Asthma   . Cancer ([place])    basal cell chest  . Complication of anesthesia   . Coronary artery disease   . Depression   . Diabetes mellitus without complication ([place])    type 2  . Heart murmur   . Hepatitis    auto- immune  . History of kidney stones   . History of nuclear stress test 05/21/2010   dipyridamole; normal perfusion, preserved LV systolic EF of 40%  . Hyperlipidemia   . Hypertension   . Liver failure ([place])    h/o autoimmune hepatitis   . Myocardial infarction ([place])   . Neuropathy    tingling and burning left leg to foot  . Obesity   . PONV (postoperative nausea and vomiting)   . Psoriasis   . Tubular adenoma of colon    colon path 01/09/2017   Past Surgical History:  Procedure Laterality Date  . Carotid Doppler  10/2008   R & L ICAs 0-49% diameter reduction   . COLONOSCOPY    . CORONARY ANGIOPLASTY WITH STENT PLACEMENT  09/2008   2.25x15m Cypher DES to in-stent restenosis of prox LAD; Mini-Vision stent x2 2.0x112mto area distal of initial LAD stent; 3 2.5x1558maxus stents prox to Cypher stent in circumflex  . CORONARY ANGIOPLASTY WITH STENT PLACEMENT  09/2005   Cypher DES 3.0x22m66m distal RCA; 2.25x12mm30mi0Vision stent to mid LAD; 2.5x18mm 104mer stent to circumflex  . CORONARY STENT INTERVENTION N/A 11/13/2018   Procedure: CORONARY STENT INTERVENTION;  Surgeon: VaranaJettie Booze Location: [facility];  Service: Cardiovascular;  Laterality: N/A;  . LEFT HEART CATH AND CORONARY ANGIOGRAPHY N/A 11/13/2018   Procedure: LEFT  HEART CATH AND CORONARY ANGIOGRAPHY;  Surgeon: VaranaJettie Booze [facility];  Service: Cardiovascular;  Laterality: N/A;  . LUMBAR LAMINECTOMY/DECOMPRESSION MICRODISCECTOMY Left 10/18/2016   Procedure: Left Lumbar Four-Five Microdiscectomy;  Surgeon: Stern,Erline Levine Location: [facility] OR;[place]: Neurosurgery;  Laterality: Left;  Left L4-5 Microdiscectomy  . ROTATOR CUFF REPAIR Left   . SHOULDER ARTHROSCOPY W/ ROTATOR CUFF REPAIR Right   . TRANSTHORACIC ECHOCARDIOGRAM  04/2010   EF=>55%, borderline conc LVH; trace MR & TR; AV mildly sclerotic; aortic root sclerosis/calcif  . ureteral stents     Social History   Socioeconomic History  . Marital status: Single    Spouse name: Not on file  . Number of children: 1  . Years of education: Not on file  .  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 Financial resource strain: Not on file  . Food insecurity    Worry: Not on file    Inability: Not on file  . Transportation needs    Medical: Not on file    Non-medical: Not on file  Tobacco Use  . Smoking status: Never Smoker  . Smokeless tobacco: Never Used  Substance and Sexual Activity  . Alcohol use: No  . Drug use: No  . Sexual activity: Not Currently  Lifestyle  . Physical activity    Days per week: Not on file    Minutes per session: Not on file  . Stress: Not on file  Relationships  . Social Herbalist on phone: Not on file    Gets together: Not on file    Attends religious service: Not on file    Active member of club or organization: Not on file    Attends meetings of clubs or organizations: Not on file    Relationship status: Not on file  . Intimate partner violence    Fear of current or ex partner: Not on file    Emotionally abused: Not on file    Physically abused: Not on file    Forced sexual activity: Not on file   Other Topics Concern  . Not on file  Social History Narrative  . Not on file   Current Outpatient Medications on File Prior to Visit  Medication Sig Dispense Refill  . isosorbide mononitrate (IMDUR) 30 MG 24 hr tablet TAKE 1 TABLET (30 MG TOTAL) BY MOUTH DAILY. ** DO NOT CRUSH ** 90 tablet 3  . Alirocumab (PRALUENT) 150 MG/ML SOAJ Inject 150 mg into the skin every 14 (fourteen) days. 6 pen 3  . Ascorbic Acid (VITAMIN C PO) Take 5,000 mg by mouth daily.     Marland Kitchen aspirin EC 81 MG tablet Take 81 mg by mouth at bedtime.    . Blood Glucose Monitoring Suppl (ONETOUCH VERIO) w/Device KIT Use to check blood sugar 2 times a day 1 kit 0  . BREO ELLIPTA 200-25 MCG/INH AEPB TAKE 1 PUFF BY MOUTH EVERY DAY    . Coenzyme Q10 300 MG CAPS Take 300 mg by mouth daily.    . Cranberry (SM CRANBERRY) 300 MG tablet Take 300 mg by mouth every evening.    . fenofibrate (TRICOR) 145 MG tablet Take 1 tablet (145 mg total) by mouth every evening. 90 tablet 1  . glipiZIDE (GLUCOTROL) 5 MG tablet TAKE 0.5-1 TABLETS (2.5-5 MG TOTAL) BY MOUTH DAILY BEFORE SUPPER. 90 tablet 2  . glucose blood (ONETOUCH VERIO) test strip Use to check blood sugar 2 times a day 200 each 12  . icosapent Ethyl (VASCEPA) 1 g capsule Take 2 capsules (2 g total) by mouth 2 (two) times daily. 360 capsule 3  . Insulin Detemir (LEVEMIR FLEXTOUCH) 100 UNIT/ML Pen Inject 50 Units into the skin every evening. 45 pen 3  . Insulin Pen Needle (BD PEN NEEDLE NANO U/F) 32G X 4 MM MISC Use as directed for insulin administration. 100 each 3  . metFORMIN (GLUCOPHAGE) 1000 MG tablet Take 1 tablet (1,000 mg total) by mouth 2 (two) times daily with a meal. 180 tablet 1  . metoprolol succinate (TOPROL-XL) 25 MG 24 hr tablet Take 1.5 tablets (37.5 mg total) by mouth daily. 135 tablet 3  .  Multiple Vitamin (MULTIVITAMIN WITH MINERALS) TABS Take 1 tablet by mouth daily.    . nitrofurantoin, macrocrystal-monohydrate, (MACROBID) 100 MG capsule Take 1 capsule (100 mg total)  by mouth 2 (two) times daily. 20 capsule 0  . nitroGLYCERIN (NITROSTAT) 0.4 MG SL tablet DISSOLVE 1 TAB UNDER THE TONGUE EVERY 5 MIN AS NEEDED FOR CHEST PAIN (MAX OF 3) 75 tablet 1  . ondansetron (ZOFRAN) 4 MG tablet SMARTSIG:1 Tablet(s) By Mouth Every 12 Hours    . OTEZLA 30 MG TABS Take 1 tablet by mouth 2 (two) times daily.    . Probiotic Product (PROBIOTIC PO) Take by mouth.    . ReliOn Lancets Thin 26G MISC 1 Units by Does not apply route 2 (two) times daily. E 11.9 100 each 12  . rosuvastatin (CRESTOR) 20 MG tablet TAKE 1 TABLET BY MOUTH EVERY DAY 90 tablet 3  . Semaglutide,0.25 or 0.5MG/DOS, (OZEMPIC, 0.25 OR 0.5 MG/DOSE,) 2 MG/1.5ML SOPN Inject 0.5 mg into the skin every Monday. 6 pen 3  . sharps container For disposing of insulin pen needles 1 each 2  . sulfamethoxazole-trimethoprim (BACTRIM DS) 800-160 MG tablet Take 1 tablet by mouth 2 (two) times daily. (Patient not taking: Reported on 06/18/2019) 14 tablet 0  . triamcinolone (KENALOG) 0.025 % ointment Apply 1 application topically 2 (two) times daily.    . valsartan (DIOVAN) 160 MG tablet TAKE 1 TABLET BY MOUTH EVERY DAY 90 tablet 3   Current Facility-Administered Medications on File Prior to Visit  Medication Dose Route Frequency Provider Last Rate Last Admin  . albuterol (PROVENTIL) (2.5 MG/3ML) 0.083% nebulizer solution 2.5 mg  2.5 mg Nebulization Once Rita Ohara, MD       Allergies  Allergen Reactions  . Penicillins Hives     PATIENT HAD A PCN REACTION WITH IMMEDIATE RASH, FACIAL/TONGUE/THROAT SWELLING, SOB, OR LIGHTHEADEDNESS WITH HYPOTENSION:  #  #  #  YES  #  #  #   Has patient had a PCN reaction causing severe rash involving mucus membranes or skin necrosis:Unknown Has patient had a PCN reaction that required hospitalization:No Has patient had a PCN reaction occurring within the last 10 years:No If all of the above answers are "NO", then may proceed with Cephalosporin use.    Family History  Problem Relation Age of  Onset  . Diabetes Brother    PE: BP (!) 142/96   Pulse 68   Ht '5\' 4"'  (1.626 m)   Wt 181 lb (82.1 kg)   SpO2 99%   BMI 31.07 kg/m  Wt Readings from Last 3 Encounters:  10/31/19 181 lb (82.1 kg)  06/18/19 176 lb 6.4 oz (80 kg)  06/07/19 178 lb (80.7 kg)   Constitutional: overweight, in NAD Eyes: PERRLA, EOMI, no exophthalmos ENT: moist mucous membranes, no thyromegaly, no cervical lymphadenopathy Cardiovascular: RRR, No RG, +2/6 SEM Respiratory: CTA B Gastrointestinal: abdomen soft, NT, ND, BS+ Musculoskeletal: no deformities, strength intact in all 4 Skin: moist, warm, no rashes Neurological: no tremor with outstretched hands, DTR normal in all 4  ASSESSMENT: 1. DM2, insulin-dependent, uncontrolled, with long-term complications - CAD-s/p AMI, s/p stents, aortic valve stenosis - CKD  2. HL  3.  Obesity class I  PLAN:  1. Patient with longstanding, uncontrolled type 2 diabetes, on oral antidiabetic regimen with Metformin and sulfonylurea and also GLP-1 receptor agonist along with long-acting insulin.  She had initially some GI discomfort which Ozempic but this improved.  At last visit, we increased the dose to 1 mg  weekly.  At that time, sugars were at goal in the morning but they were worse during the day, also possibly due to her steroid injections.  She was preparing to start [facility], which could also cause some GI symptoms, so I was hoping that she could increase the dose of Ozempic before starting [place].  We also discussed about an SGLT2 inhibitor but she wanted to forego this due to the risk of increased urination.  I also advised her not to use 70 units of Levemir at night, since she was dropping her sugars too much from evening to morning on this dose.  HbA1c at that time was slightly higher, at 6.4%. -At this visit, she tells me that she did not increase the Ozempic dose around the time of her weight loss from [place] after our last visit, and afterwards, she forgot to do  it.  However, she improved her portions and sugars started to improve significantly approximately 1.5 months ago.  In fact, they are almost all at goal now.  We do not absolutely need to increase her Ozempic now.  Regarding glipizide, she still forgets it many times, but we discussed about only using it with a larger meal.  Of note, she is also using a lower dose of Levemir compared to last visit, since her insulin resistance decreased in the setting of weight loss. - I suggested to:  Patient Instructions  Please continue: - Metformin 1000 mg 2x a day with meals - Glipizide 2.5 to 5 mg before a large meal - Ozempic 0.5 mg weekly - Levemir 50 units at bedtime  Please come back for a follow-up appointment in 4 months.  - advised to check sugars at different times of the day - 1-2x a day, rotating check times - advised for yearly eye exams >> she is not UTD - return to clinic in 4 months  2. HL -Reviewed latest lipid panel from 09/2019: Triglycerides high, the rest of the fractions are low: Lab Results  Component Value Date   CHOL 59 (L) 10/22/2019   HDL 20 (L) 10/22/2019   LDLCALC 8 10/22/2019   LDLDIRECT 93 11/15/2012   TRIG 194 (H) 10/22/2019   CHOLHDL 3.0 10/22/2019  -She continues on Crestor 20, fenofibrate, Vascepa, and also Praluent-without side effects.  3.  Obesity class I -We will continue her GLP-1 receptor agonist which should also help with weight loss. - net weight loss of approximately 5 pounds since last visit, but lost more than 20 pounds due to GI side effects of Otezla.  She stopped since then.  [name], MD PhD [facility] Endocrinology

## 2019-11-01 ENCOUNTER — Telehealth: Payer: Self-pay | Admitting: Cardiovascular Disease

## 2019-11-01 NOTE — Telephone Encounter (Signed)
Left a message for the patient to call back.  

## 2019-11-01 NOTE — Telephone Encounter (Signed)
Patient returned call for her echo results.  

## 2019-11-04 NOTE — Telephone Encounter (Signed)
Patient made aware of results and verbalized understanding.   Echo results: The aortic stenosis remains mild. Would recheck in about 3-5 years unless new symptoms develop in the meantime. Normal heart pumping strength.

## 2019-11-07 ENCOUNTER — Other Ambulatory Visit: Payer: Self-pay

## 2019-11-07 ENCOUNTER — Ambulatory Visit: Payer: BC Managed Care – PPO | Admitting: Cardiovascular Disease

## 2019-11-07 ENCOUNTER — Encounter: Payer: Self-pay | Admitting: Cardiovascular Disease

## 2019-11-07 VITALS — BP 120/68 | HR 94 | Ht 64.5 in | Wt 180.2 lb

## 2019-11-07 DIAGNOSIS — E119 Type 2 diabetes mellitus without complications: Secondary | ICD-10-CM

## 2019-11-07 DIAGNOSIS — I10 Essential (primary) hypertension: Secondary | ICD-10-CM | POA: Diagnosis not present

## 2019-11-07 DIAGNOSIS — I350 Nonrheumatic aortic (valve) stenosis: Secondary | ICD-10-CM | POA: Diagnosis not present

## 2019-11-07 DIAGNOSIS — E782 Mixed hyperlipidemia: Secondary | ICD-10-CM

## 2019-11-07 DIAGNOSIS — J4530 Mild persistent asthma, uncomplicated: Secondary | ICD-10-CM

## 2019-11-07 DIAGNOSIS — I2510 Atherosclerotic heart disease of native coronary artery without angina pectoris: Secondary | ICD-10-CM

## 2019-11-07 DIAGNOSIS — Z794 Long term (current) use of insulin: Secondary | ICD-10-CM

## 2019-11-07 DIAGNOSIS — E669 Obesity, unspecified: Secondary | ICD-10-CM

## 2019-11-07 MED ORDER — PRALUENT 150 MG/ML ~~LOC~~ SOAJ: 150.0000 mg | SUBCUTANEOUS | 1 refills | Status: DC

## 2019-11-07 NOTE — Patient Instructions (Signed)

## 2019-11-08 ENCOUNTER — Encounter: Payer: Self-pay | Admitting: Cardiovascular Disease

## 2019-11-08 NOTE — Progress Notes (Signed)
Patient ID: Haley George, female   DOB: [birth year], 64 y.o.   MRN: [id number]    Cardiology Office Note    Date:  11/09/2019   ID:  Haley George, DOB Nov 06, 1955, MRN [id number]  PCP:  Denita Lung, MD  Cardiologist:   Sanda Klein, MD   Chief Complaint  Patient presents with  . Coronary Artery Disease    History of Present Illness:  Haley George is a 64 y.o. female with diabetes mellitus, severe mixed hyperlipidemia, previous percutaneous revascularization procedures performed in all 3 major coronary arteries and restenosis requiring repeat PCI.  Most recent procedure was Cutting Balloon angioplasty and placement of a new drug-eluting stent in the mid LAD artery (synergy 2.512) on November 13, 2018.  For the most part, the restenosis was treated with cutting balloon, with stent placed only in the proximal portion of restenosis to avoid along the area of 3 stent layers. At the time of cardiac catheterization note was made of very mild aortic stenosis and normal left heart filling pressure. Echo June 2018 showed normal left ventricular systolic function, EF 37-34% and mild diastolic dysfunction without elevated filling pressures.    Since the beginning treatment with Praluent for her hypercholesterolemia and remarkable improvement in her glucose control under Dr. Chrissie Noa care, all her metabolic parameters have been much better.  Triglycerides (which were usually in the 500 range) are down to 194 and her LDL cholesterol has shown a dramatic drop to only 8.  She is currently receiving rosuvastatin, Praluent, fenofibrate, Vascepa for her severe mixed hyperlipidemia.  She is on Levemir 50 units, Metformin and Ozempic for diabetes, still using a low-dose of glipizide, only as needed  before larger meals.  Her last 2 hemoglobin A1c values have both been around 6.5%.  Few weeks ago she had a period where she was experiencing dizziness and chest heaviness, but this occurred at rest.  During the  daytime she was able to be physically active without complaints of chest discomfort.  It has been almost exactly a year since her last revascularization procedure.  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  At the end of the day after working outside, she will often have leg cramps.  After stopping [place] the problems with nausea and vomiting and rapid weight loss that she experienced at the beginning of the year have resolved.  She was taking that medication for autoimmune hepatitis.  She is currently not on any medication for this disorder.  The patient specifically denies any chest pain with exertion, dyspnea at rest or with exertion, orthopnea, paroxysmal nocturnal dyspnea, syncope, palpitations, focal neurological deficits, intermittent claudication, lower extremity edema, unexplained weight gain, cough, hemoptysis or wheezing.   Previous coronary interventions 2007  - mid LAD 2.2512 mini vision - left circumflex 2.518 Cypher  - RCA 3.023 Cypher  2010  - mid LAD 2.2512 Taxus overlapping 2.012 mini vision, overlapping another 2.012 mini vision -  left circumflex 2.516 Taxus upstream of previous stent.  2020  - mid LAD Synergy 2.5 x12 , overlapping the proximal area of restenosis in the mid LAD (short area of 3 stent layers).  She had a normal nuclear stress test in November 2016. EF was 56%. She had a "false positive" ECG response.    Past Medical History:  Diagnosis Date  . Asthma   . Cancer ([place])  basal cell chest  . Complication of anesthesia   . Coronary artery disease   . Depression   . Diabetes mellitus without complication ([place])    type 2  . Heart murmur   . Hepatitis    auto- immune  . History of kidney stones   . History of nuclear stress test 05/21/2010   dipyridamole; normal perfusion, preserved LV systolic EF of 65%  . Hyperlipidemia   .  Hypertension   . Liver failure ([place])    h/o autoimmune hepatitis   . Myocardial infarction ([place])   . Neuropathy    tingling and burning left leg to foot  . Obesity   . PONV (postoperative nausea and vomiting)   . Psoriasis   . Tubular adenoma of colon    colon path 01/09/2017    Past Surgical History:  Procedure Laterality Date  . Carotid Doppler  10/2008   R & L ICAs 0-49% diameter reduction   . COLONOSCOPY    . CORONARY ANGIOPLASTY WITH STENT PLACEMENT  09/2008   2.25x51m Cypher DES to in-stent restenosis of prox LAD; Mini-Vision stent x2 2.0x189mto area distal of initial LAD stent; 3 2.5x1517maxus stents prox to Cypher stent in circumflex  . CORONARY ANGIOPLASTY WITH STENT PLACEMENT  09/2005   Cypher DES 3.0x22m44m distal RCA; 2.25x12mm92mi0Vision stent to mid LAD; 2.5x18mm 38mer stent to circumflex  . CORONARY STENT INTERVENTION N/A 11/13/2018   Procedure: CORONARY STENT INTERVENTION;  Surgeon: VaranaJettie Booze [facility];  Service: Cardiovascular;  Laterality: N/A;  . LEFT HEART CATH AND CORONARY ANGIOGRAPHY N/A 11/13/2018   Procedure: LEFT HEART CATH AND CORONARY ANGIOGRAPHY;  Surgeon: VaranaJettie Booze [facility];  Service: Cardiovascular;  Laterality: N/A;  . LUMBAR LAMINECTOMY/DECOMPRESSION MICRODISCECTOMY Left 10/18/2016   Procedure: Left Lumbar Four-Five Microdiscectomy;  Surgeon: Stern,Erline Levine [facility];  Laterality: Left;  Left L4-5 Microdiscectomy  . ROTATOR CUFF REPAIR Left   . SHOULDER ARTHROSCOPY W/ ROTATOR CUFF REPAIR Right   . TRANSTHORACIC ECHOCARDIOGRAM  04/2010   EF=>55%, borderline conc LVH; trace MR & TR; AV mildly sclerotic; aortic root sclerosis/calcif  . ureteral stents      Outpatient Medications Prior to Visit  Medication Sig Dispense Refill  . Ascorbic Acid (VITAMIN C PO) Take 5,000 mg by mouth daily.     . aspiMarland Kitchenin EC 81 MG tablet Take 81 mg by mouth at  bedtime.    . Blood Glucose Monitoring Suppl (ONETOUCH VERIO) w/Device KIT Use to check blood sugar 2 times a day 1 kit 0  . BREO ELLIPTA 200-25 MCG/INH AEPB TAKE 1 PUFF BY MOUTH EVERY DAY    . Coenzyme Q10 300 MG CAPS Take 300 mg by mouth daily.    . Cranberry (SM CRANBERRY) 300 MG tablet Take 300 mg by mouth every evening.    . fenofibrate (TRICOR) 145 MG tablet Take 1 tablet (145 mg total) by mouth every evening. 90 tablet 1  . glipiZIDE (GLUCOTROL) 5 MG tablet TAKE 0.5-1 TABLETS (2.5-5 MG TOTAL) BY MOUTH DAILY BEFORE SUPPER. 90 tablet 2  . glucose blood (ONETOUCH VERIO) test strip Use to check blood sugar 2 times a day 200 each 12  . icosapent Ethyl (VASCEPA) 1 g capsule Take 2 capsules (2 g total) by mouth 2 (two) times daily. 360 capsule 3  . Insulin Detemir (LEVEMIR FLEXTOUCH) 100 UNIT/ML Pen Inject 50 Units into the skin every  evening. 45 pen 3  . Insulin Pen Needle (BD PEN NEEDLE NANO U/F) 32G X 4 MM MISC Use as directed for insulin administration. 100 each 3  . isosorbide mononitrate (IMDUR) 30 MG 24 hr tablet TAKE 1 TABLET (30 MG TOTAL) BY MOUTH DAILY. ** DO NOT CRUSH ** 90 tablet 3  . metFORMIN (GLUCOPHAGE) 1000 MG tablet Take 1 tablet (1,000 mg total) by mouth 2 (two) times daily with a meal. 180 tablet 1  . metoprolol succinate (TOPROL-XL) 25 MG 24 hr tablet Take 1.5 tablets (37.5 mg total) by mouth daily. 135 tablet 3  . Multiple Vitamin (MULTIVITAMIN WITH MINERALS) TABS Take 1 tablet by mouth daily.    . nitroGLYCERIN (NITROSTAT) 0.4 MG SL tablet DISSOLVE 1 TAB UNDER THE TONGUE EVERY 5 MIN AS NEEDED FOR CHEST PAIN (MAX OF 3) 75 tablet 1  . OTEZLA 30 MG TABS Take 1 tablet by mouth 2 (two) times daily.    . Probiotic Product (PROBIOTIC PO) Take by mouth.    . ReliOn Lancets Thin 26G MISC 1 Units by Does not apply route 2 (two) times daily. E 11.9 100 each 12  . rosuvastatin (CRESTOR) 20 MG tablet TAKE 1 TABLET BY MOUTH EVERY DAY 90 tablet 3  . Semaglutide,0.25 or 0.5MG/DOS,  (OZEMPIC, 0.25 OR 0.5 MG/DOSE,) 2 MG/1.5ML SOPN Inject 0.5 mg into the skin every Monday. 6 pen 3  . sharps container For disposing of insulin pen needles 1 each 2  . triamcinolone (KENALOG) 0.025 % ointment Apply 1 application topically 2 (two) times daily.    . valsartan (DIOVAN) 160 MG tablet TAKE 1 TABLET BY MOUTH EVERY DAY 90 tablet 3  . Alirocumab (PRALUENT) 150 MG/ML SOAJ Inject 150 mg into the skin every 14 (fourteen) days. 6 pen 3  . nitrofurantoin, macrocrystal-monohydrate, (MACROBID) 100 MG capsule Take 1 capsule (100 mg total) by mouth 2 (two) times daily. 20 capsule 0  . ondansetron (ZOFRAN) 4 MG tablet SMARTSIG:1 Tablet(s) By Mouth Every 12 Hours    . sulfamethoxazole-trimethoprim (BACTRIM DS) 800-160 MG tablet Take 1 tablet by mouth 2 (two) times daily. 14 tablet 0   Facility-Administered Medications Prior to Visit  Medication Dose Route Frequency Provider Last Rate Last Admin  . albuterol (PROVENTIL) (2.5 MG/3ML) 0.083% nebulizer solution 2.5 mg  2.5 mg Nebulization Once Rita Ohara, MD         Allergies:   Penicillins   Social History   Socioeconomic History  . Marital status: Married    Spouse name: Not on file  . Number of children: 1  . Years of education: Not on file  . Highest education level: Not on file  Occupational History    Employer: [employer]  Tobacco Use  . Smoking status: Never Smoker  . Smokeless tobacco: Never Used  Vaping Use  . Vaping Use: Never used  Substance and Sexual Activity  . Alcohol use: No  . Drug use: No  . Sexual activity: Not Currently  Other Topics Concern  . Not on file  Social History Narrative  . Not on file   Social Determinants of Health   Financial Resource Strain: Low Risk   . Difficulty of Paying Living Expenses: Not very hard  Food Insecurity: No Food Insecurity  . Worried About Charity fundraiser in the Last Year: Never true  . Ran Out of Food in the Last Year: Never true  Transportation  Needs: No Transportation Needs  . Lack of Transportation (Medical): No  .  Lack of Transportation (Non-Medical): No  Physical Activity: Unknown  . Days of Exercise per Week: 2 days  . Minutes of Exercise per Session: Not on file  Stress: No Stress Concern Present  . Feeling of Stress : Only a little  Social Connections: Unknown  . Frequency of Communication with Friends and Family: Three times a week  . Frequency of Social Gatherings with Friends and Family: Not on file  . Attends Religious Services: Not on file  . Active Member of Clubs or Organizations: Yes  . Attends Archivist Meetings: Not on file  . Marital Status: Married     ROS:   Please see the history of present illness.    ROS  All other systems are reviewed and are negative  PHYSICAL EXAM:   VS:  BP 120/68   Pulse 94   Ht 5' 4.5" (1.638 m)   Wt 180 lb 3.2 oz (81.7 kg)   SpO2 100%   BMI 30.45 kg/m     General: Alert, oriented x3, no distress, borderline obese Head: no evidence of trauma, PERRL, EOMI, no exophtalmos or lid lag, no myxedema, no xanthelasma; normal ears, nose and oropharynx Neck: normal jugular venous pulsations and no hepatojugular reflux; brisk carotid pulses without delay and no carotid bruits Chest: clear to auscultation, no signs of consolidation by percussion or palpation, normal fremitus, symmetrical and full respiratory excursions Cardiovascular: normal position and quality of the apical impulse, regular rhythm, normal first and second heart sounds, early peaking systolic ejection murmur RUSB, no diastolic murmurs, rubs or gallops Abdomen: no tenderness or distention, no masses by palpation, no abnormal pulsatility or arterial bruits, normal bowel sounds, no hepatosplenomegaly Extremities: no clubbing, cyanosis or edema; 2+ radial, ulnar and brachial pulses bilaterally; 2+ right femoral, posterior tibial and dorsalis pedis pulses; 2+ left femoral, posterior tibial and dorsalis pedis  pulses; no subclavian or femoral bruits Neurological: grossly nonfocal Psych: Normal mood and affect   Wt Readings from Last 3 Encounters:  11/07/19 180 lb 3.2 oz (81.7 kg)  10/31/19 181 lb (82.1 kg)  06/18/19 176 lb 6.4 oz (80 kg)    Studies/Labs Reviewed:   ECHO 10/30/2019:   1. Left ventricular ejection fraction, by estimation, is 55 to 60%. The  left ventricle has normal function. The left ventricle has no regional  wall motion abnormalities. Left ventricular diastolic parameters are  indeterminate.  2. Right ventricular systolic function is normal. The right ventricular  size is normal. There is normal pulmonary artery systolic pressure.  3. The mitral valve is normal in structure. Trivial mitral valve  regurgitation. No evidence of mitral stenosis.  4. The aortic valve is tricuspid. Aortic valve regurgitation is not  visualized. Mild aortic valve stenosis.  5. The inferior vena cava is normal in size with greater than 50%  respiratory variability, suggesting right atrial pressure of 3 mmHg.     EKG:  EKG is not  ordered today. 06/07/2019 ECG shows sinus rhythm and is a completely normal tracing.  QTc 438 ms.  Recent Labs: 11/14/2018: Hemoglobin 10.6; Platelets 173 11/19/2018: ALT 30; Magnesium 1.7 11/27/2018: BUN 20; Creatinine, Ser 1.12; Potassium 5.2; Sodium 139   Lipid Panel    Component Value Date/Time   CHOL 59 (L) 10/22/2019 1041   TRIG 194 (H) 10/22/2019 1041   HDL 20 (L) 10/22/2019 1041   CHOLHDL 3.0 10/22/2019 1041   CHOLHDL 5.1 (H) 07/12/2016 1413   VLDL 50 (H) 07/12/2016 1413   LDLCALC 8 10/22/2019 1041  LDLDIRECT 93 11/15/2012 0829    ASSESSMENT:    1. Coronary artery disease involving native coronary artery of native heart without angina pectoris   2. Mixed hyperlipidemia   3. Aortic valve stenosis, nonrheumatic   4. Essential hypertension   5. Insulin-requiring or dependent type II diabetes mellitus ([place])   6. Mild obesity   7. Mild  persistent asthma without complication      PLAN:  In order of problems listed above:  1. CAD: Asymptomatic since July 2020 when she had another stent in the mid LAD artery.  She had a few atypical symptoms at rest a few weeks ago, but these seem to have resolved.  Still in the window for possible in-stent restenosis.  Remarkable improvement in all risk factors over the last year. 2. HLP: Excellent improvement in LDL cholesterol and triglycerides.  Discussed option to back off some of her lipid-lowering medications but we decided to wait for another 6 months.  I will try to get rid of the fenofibrate first and may be reduce the dose of rosuvastatin, if the LDL remains in single digits. 3. AS: The echocardiogram performed on July 7 still shows mild aortic stenosis.  We will follow up in 3-5 years unless she develops new symptoms. 4. HTN: Excellent control.  Target 130/80 or less. 5. DM: Excellent improvement in glycemic control.  Medications are being adjusted by Dr. Cruzita Lederer. 6. Obesity: Weight loss has stabilized and she remains around a BMI of about 30. 7. Reactive airway disease: No recent problems with wheezing, but be cautious with higher doses of beta-blockers.     Medication Adjustments/Labs and Tests Ordered: Current medicines are reviewed at length with the patient today.  Concerns regarding medicines are outlined above.  Medication changes, Labs and Tests ordered today are listed in the Patient Instructions below. Patient Instructions  Medication Instructions:  No changes *If you need a refill on your cardiac medications before your next appointment, please call your pharmacy*   Lab Work: None ordered If you have labs (blood work) drawn today and your tests are completely normal, you will receive your results only by: Marland Kitchen MyChart Message (if you have MyChart) OR . A paper copy in the mail If you have any lab test that is abnormal or we need to change your treatment, we will call  you to review the results.   Testing/Procedures: None ordered   Follow-Up: At [facility], you and your health needs are our priority.  As part of our continuing mission to provide you with exceptional heart care, we have created designated Provider Care Teams.  These Care Teams include your primary Cardiologist (physician) and Advanced Practice Providers (APPs -  Physician Assistants and Nurse Practitioners) who all work together to provide you with the care you need, when you need it.  We recommend signing up for the patient portal called "MyChart".  Sign up information is provided on this After Visit Summary.  MyChart is used to connect with patients for Virtual Visits (Telemedicine).  Patients are able to view lab/test results, encounter notes, upcoming appointments, etc.  Non-urgent messages can be sent to your provider as well.   To learn more about what you can do with MyChart, go to [email or web address].    Your next appointment:   6 month(s)  The format for your next appointment:   In Person  Provider:   You may see Sanda Klein, MD or one of the following Advanced Practice Providers on your designated Care Team:  Almyra Deforest, PA-C  Fabian Sharp, PA-C or   Roby Lofts, PA-C          Signed, Sanda Klein, MD  11/09/2019 4:42 PM    [facility] [place], [place],   [address] Phone: [phone]; Fax: [phone]

## 2019-11-19 ENCOUNTER — Telehealth: Payer: Self-pay

## 2019-11-19 NOTE — Telephone Encounter (Signed)
LMOMED the pt to bring updated insurance card as the one on file say no eligibilty found when I tried to complete a PA for praluent.

## 2019-11-25 ENCOUNTER — Telehealth: Payer: Self-pay

## 2019-11-25 NOTE — Telephone Encounter (Signed)
Pt called in asking if they are supposed to staying on the plavix because when she 1st started she thought she would only have to do it for a year. Will route to [employer] rn for dr. Sallyanne Kuster

## 2019-11-25 NOTE — Telephone Encounter (Signed)
The patient has been made aware that she can discontinue her Plavix.

## 2019-11-28 ENCOUNTER — Other Ambulatory Visit: Payer: Self-pay | Admitting: Cardiovascular Disease

## 2019-12-01 ENCOUNTER — Inpatient Hospital Stay (HOSPITAL_COMMUNITY)
Admission: EM | Admit: 2019-12-01 | Discharge: 2019-12-03 | DRG: 247 | Disposition: A | Discharge status: Home / Self Care | Payer: BC Managed Care – PPO | Attending: Cardiology | Admitting: Cardiology

## 2019-12-01 ENCOUNTER — Other Ambulatory Visit: Payer: Self-pay | Admitting: Cardiovascular Disease

## 2019-12-01 ENCOUNTER — Other Ambulatory Visit: Payer: Self-pay

## 2019-12-01 ENCOUNTER — Encounter (HOSPITAL_COMMUNITY): Payer: Self-pay | Admitting: Emergency Medicine

## 2019-12-01 ENCOUNTER — Emergency Department (HOSPITAL_COMMUNITY): Payer: BC Managed Care – PPO

## 2019-12-01 DIAGNOSIS — D649 Anemia, unspecified: Secondary | ICD-10-CM | POA: Diagnosis present

## 2019-12-01 DIAGNOSIS — E782 Mixed hyperlipidemia: Secondary | ICD-10-CM | POA: Diagnosis present

## 2019-12-01 DIAGNOSIS — I10 Essential (primary) hypertension: Secondary | ICD-10-CM

## 2019-12-01 DIAGNOSIS — N179 Acute kidney failure, unspecified: Secondary | ICD-10-CM | POA: Diagnosis present

## 2019-12-01 DIAGNOSIS — Z87442 Personal history of urinary calculi: Secondary | ICD-10-CM | POA: Diagnosis not present

## 2019-12-01 DIAGNOSIS — E1122 Type 2 diabetes mellitus with diabetic chronic kidney disease: Secondary | ICD-10-CM | POA: Diagnosis present

## 2019-12-01 DIAGNOSIS — Z794 Long term (current) use of insulin: Secondary | ICD-10-CM

## 2019-12-01 DIAGNOSIS — R011 Cardiac murmur, unspecified: Secondary | ICD-10-CM | POA: Diagnosis present

## 2019-12-01 DIAGNOSIS — Z7951 Long term (current) use of inhaled steroids: Secondary | ICD-10-CM

## 2019-12-01 DIAGNOSIS — E785 Hyperlipidemia, unspecified: Secondary | ICD-10-CM | POA: Diagnosis present

## 2019-12-01 DIAGNOSIS — E669 Obesity, unspecified: Secondary | ICD-10-CM | POA: Diagnosis present

## 2019-12-01 DIAGNOSIS — I25119 Atherosclerotic heart disease of native coronary artery with unspecified angina pectoris: Secondary | ICD-10-CM | POA: Diagnosis present

## 2019-12-01 DIAGNOSIS — Z880 Allergy status to penicillin: Secondary | ICD-10-CM

## 2019-12-01 DIAGNOSIS — I252 Old myocardial infarction: Secondary | ICD-10-CM | POA: Diagnosis not present

## 2019-12-01 DIAGNOSIS — J45909 Unspecified asthma, uncomplicated: Secondary | ICD-10-CM | POA: Diagnosis present

## 2019-12-01 DIAGNOSIS — K754 Autoimmune hepatitis: Secondary | ICD-10-CM | POA: Diagnosis present

## 2019-12-01 DIAGNOSIS — I214 Non-ST elevation (NSTEMI) myocardial infarction: Principal | ICD-10-CM

## 2019-12-01 DIAGNOSIS — I152 Hypertension secondary to endocrine disorders: Secondary | ICD-10-CM | POA: Diagnosis present

## 2019-12-01 DIAGNOSIS — N1832 Chronic kidney disease, stage 3b: Secondary | ICD-10-CM | POA: Diagnosis present

## 2019-12-01 DIAGNOSIS — E1142 Type 2 diabetes mellitus with diabetic polyneuropathy: Secondary | ICD-10-CM | POA: Diagnosis present

## 2019-12-01 DIAGNOSIS — L409 Psoriasis, unspecified: Secondary | ICD-10-CM | POA: Diagnosis present

## 2019-12-01 DIAGNOSIS — Z20822 Contact with and (suspected) exposure to covid-19: Secondary | ICD-10-CM | POA: Diagnosis present

## 2019-12-01 DIAGNOSIS — E1169 Type 2 diabetes mellitus with other specified complication: Secondary | ICD-10-CM | POA: Diagnosis present

## 2019-12-01 DIAGNOSIS — I2510 Atherosclerotic heart disease of native coronary artery without angina pectoris: Secondary | ICD-10-CM | POA: Diagnosis not present

## 2019-12-01 DIAGNOSIS — Z6830 Body mass index (BMI) 30.0-30.9, adult: Secondary | ICD-10-CM | POA: Diagnosis not present

## 2019-12-01 DIAGNOSIS — I5032 Chronic diastolic (congestive) heart failure: Secondary | ICD-10-CM | POA: Diagnosis present

## 2019-12-01 DIAGNOSIS — Y831 Surgical operation with implant of artificial internal device as the cause of abnormal reaction of the patient, or of later complication, without mention of misadventure at the time of the procedure: Secondary | ICD-10-CM | POA: Diagnosis present

## 2019-12-01 DIAGNOSIS — T82855A Stenosis of coronary artery stent, initial encounter: Secondary | ICD-10-CM | POA: Diagnosis present

## 2019-12-01 DIAGNOSIS — R0789 Other chest pain: Secondary | ICD-10-CM | POA: Diagnosis present

## 2019-12-01 DIAGNOSIS — N1831 Chronic kidney disease, stage 3a: Secondary | ICD-10-CM | POA: Diagnosis not present

## 2019-12-01 DIAGNOSIS — D638 Anemia in other chronic diseases classified elsewhere: Secondary | ICD-10-CM | POA: Diagnosis not present

## 2019-12-01 DIAGNOSIS — I350 Nonrheumatic aortic (valve) stenosis: Secondary | ICD-10-CM | POA: Diagnosis present

## 2019-12-01 DIAGNOSIS — Z833 Family history of diabetes mellitus: Secondary | ICD-10-CM

## 2019-12-01 DIAGNOSIS — Z955 Presence of coronary angioplasty implant and graft: Secondary | ICD-10-CM

## 2019-12-01 DIAGNOSIS — F329 Major depressive disorder, single episode, unspecified: Secondary | ICD-10-CM | POA: Diagnosis present

## 2019-12-01 DIAGNOSIS — Z7982 Long term (current) use of aspirin: Secondary | ICD-10-CM

## 2019-12-01 DIAGNOSIS — Z8249 Family history of ischemic heart disease and other diseases of the circulatory system: Secondary | ICD-10-CM

## 2019-12-01 DIAGNOSIS — I130 Hypertensive heart and chronic kidney disease with heart failure and stage 1 through stage 4 chronic kidney disease, or unspecified chronic kidney disease: Secondary | ICD-10-CM | POA: Diagnosis present

## 2019-12-01 DIAGNOSIS — Z79899 Other long term (current) drug therapy: Secondary | ICD-10-CM

## 2019-12-01 LAB — BASIC METABOLIC PANEL
Anion gap: 11 (ref 5–15)
BUN: 18 mg/dL (ref 8–23)
CO2: 21 mmol/L — ABNORMAL LOW (ref 22–32)
Calcium: 9.6 mg/dL (ref 8.9–10.3)
Chloride: 107 mmol/L (ref 98–111)
Creatinine, Ser: 1.33 mg/dL — ABNORMAL HIGH (ref 0.44–1.00)
GFR calc Af Amer: 50 mL/min — ABNORMAL LOW (ref >60)
GFR calc non Af Amer: 43 mL/min — ABNORMAL LOW (ref >60)
Glucose, Bld: 149 mg/dL — ABNORMAL HIGH (ref 70–99)
Potassium: 4.8 mmol/L (ref 3.5–5.1)
Sodium: 139 mmol/L (ref 135–145)

## 2019-12-01 LAB — CBC
HCT: 33.9 % — ABNORMAL LOW (ref 36.0–46.0)
Hemoglobin: 10.5 g/dL — ABNORMAL LOW (ref 12.0–15.0)
MCH: 28.9 pg (ref 26.0–34.0)
MCHC: 31 g/dL (ref 30.0–36.0)
MCV: 93.4 fL (ref 80.0–100.0)
Platelets: 278 10*3/uL (ref 150–400)
RBC: 3.63 MIL/uL — ABNORMAL LOW (ref 3.87–5.11)
RDW: 13.5 % (ref 11.5–15.5)
WBC: 3.7 10*3/uL — ABNORMAL LOW (ref 4.0–10.5)
nRBC: 0 % (ref 0.0–0.2)

## 2019-12-01 LAB — SARS CORONAVIRUS 2 BY RT PCR (HOSPITAL ORDER, PERFORMED IN ~~LOC~~ HOSPITAL LAB): SARS Coronavirus 2: NEGATIVE

## 2019-12-01 LAB — HEPARIN LEVEL (UNFRACTIONATED): Heparin Unfractionated: <0.1 IU/mL — ABNORMAL LOW (ref 0.30–0.70)

## 2019-12-01 LAB — TROPONIN I (HIGH SENSITIVITY)
Troponin I (High Sensitivity): 166 ng/L (ref <18)
Troponin I (High Sensitivity): 1818 ng/L (ref <18)

## 2019-12-01 LAB — GLUCOSE, CAPILLARY: Glucose-Capillary: 209 mg/dL — ABNORMAL HIGH (ref 70–99)

## 2019-12-01 LAB — CBG MONITORING, ED: Glucose-Capillary: 92 mg/dL (ref 70–99)

## 2019-12-01 MED ORDER — ONDANSETRON HCL 4 MG/2ML IJ SOLN: 4.0000 mg | Freq: Four times a day (QID) | INTRAMUSCULAR | Status: DC | PRN

## 2019-12-01 MED ORDER — ACETAMINOPHEN 325 MG PO TABS
650.0000 mg | ORAL_TABLET | ORAL | Status: DC | PRN
  Administered 2019-12-02: 650 mg via ORAL
  Filled 2019-12-01: qty 2

## 2019-12-01 MED ORDER — ASPIRIN EC 81 MG PO TBEC: 81.0000 mg | DELAYED_RELEASE_TABLET | Freq: Every day | ORAL | Status: DC

## 2019-12-01 MED ORDER — HEPARIN (PORCINE) 25000 UT/250ML-% IV SOLN
1200.0000 [IU]/h | INTRAVENOUS | Status: DC
  Administered 2019-12-01: 850 [IU]/h via INTRAVENOUS
  Filled 2019-12-01: qty 250

## 2019-12-01 MED ORDER — NITROGLYCERIN 0.4 MG SL SUBL
0.4000 mg | SUBLINGUAL_TABLET | Freq: Once | SUBLINGUAL | Status: AC
  Administered 2019-12-01: 0.4 mg via SUBLINGUAL
  Filled 2019-12-01: qty 1

## 2019-12-01 MED ORDER — FENOFIBRATE 160 MG PO TABS
160.0000 mg | ORAL_TABLET | Freq: Every day | ORAL | Status: DC
  Administered 2019-12-01 – 2019-12-03 (×3): 160 mg via ORAL
  Filled 2019-12-01 (×3): qty 1

## 2019-12-01 MED ORDER — NITROGLYCERIN 0.4 MG SL SUBL: 0.4000 mg | SUBLINGUAL_TABLET | SUBLINGUAL | Status: DC | PRN

## 2019-12-01 MED ORDER — NITROGLYCERIN IN D5W 200-5 MCG/ML-% IV SOLN
0.0000 ug/min | INTRAVENOUS | Status: DC
  Administered 2019-12-01: 5 ug/min via INTRAVENOUS
  Filled 2019-12-01: qty 250

## 2019-12-01 MED ORDER — ROSUVASTATIN CALCIUM 20 MG PO TABS
20.0000 mg | ORAL_TABLET | Freq: Every day | ORAL | Status: DC
  Administered 2019-12-01 – 2019-12-03 (×2): 20 mg via ORAL
  Filled 2019-12-01 (×3): qty 1

## 2019-12-01 MED ORDER — APREMILAST 30 MG PO TABS: 1.0000 | ORAL_TABLET | Freq: Two times a day (BID) | ORAL | Status: DC

## 2019-12-01 MED ORDER — OXYCODONE HCL 5 MG PO TABS
5.0000 mg | ORAL_TABLET | Freq: Once | ORAL | Status: AC
  Administered 2019-12-01: 5 mg via ORAL
  Filled 2019-12-01: qty 1

## 2019-12-01 MED ORDER — SODIUM CHLORIDE 0.9% FLUSH: 3.0000 mL | Freq: Once | INTRAVENOUS | Status: DC

## 2019-12-01 MED ORDER — APREMILAST 30 MG PO TABS
1.0000 | ORAL_TABLET | Freq: Two times a day (BID) | ORAL | Status: DC
  Filled 2019-12-01: qty 1

## 2019-12-01 MED ORDER — IRBESARTAN 150 MG PO TABS
150.0000 mg | ORAL_TABLET | Freq: Every day | ORAL | Status: DC
  Administered 2019-12-01: 150 mg via ORAL
  Filled 2019-12-01 (×2): qty 1

## 2019-12-01 MED ORDER — METOPROLOL SUCCINATE ER 25 MG PO TB24
37.5000 mg | ORAL_TABLET | Freq: Every day | ORAL | Status: DC
  Administered 2019-12-01 – 2019-12-03 (×3): 37.5 mg via ORAL
  Filled 2019-12-01 (×3): qty 2

## 2019-12-01 MED ORDER — SODIUM CHLORIDE 0.9 % WEIGHT BASED INFUSION
1.0000 mL/kg/h | INTRAVENOUS | Status: DC
  Administered 2019-12-02: 1 mL/kg/h via INTRAVENOUS

## 2019-12-01 MED ORDER — HEPARIN BOLUS VIA INFUSION
4000.0000 [IU] | Freq: Once | INTRAVENOUS | Status: AC
  Administered 2019-12-01: 4000 [IU] via INTRAVENOUS
  Filled 2019-12-01: qty 4000

## 2019-12-01 MED ORDER — SODIUM CHLORIDE 0.9 % WEIGHT BASED INFUSION
3.0000 mL/kg/h | INTRAVENOUS | Status: DC
  Administered 2019-12-02: 3 mL/kg/h via INTRAVENOUS

## 2019-12-01 MED ORDER — SODIUM CHLORIDE 0.9% FLUSH: 3.0000 mL | INTRAVENOUS | Status: DC | PRN

## 2019-12-01 MED ORDER — SODIUM CHLORIDE 0.9 % IV SOLN: 250.0000 mL | INTRAVENOUS | Status: DC | PRN

## 2019-12-01 MED ORDER — ICOSAPENT ETHYL 1 G PO CAPS
2.0000 g | ORAL_CAPSULE | Freq: Two times a day (BID) | ORAL | Status: DC
  Administered 2019-12-01 – 2019-12-03 (×4): 2 g via ORAL
  Filled 2019-12-01 (×5): qty 2

## 2019-12-01 MED ORDER — ASPIRIN 81 MG PO CHEW
324.0000 mg | CHEWABLE_TABLET | Freq: Once | ORAL | Status: AC
  Administered 2019-12-01: 324 mg via ORAL
  Filled 2019-12-01: qty 4

## 2019-12-01 MED ORDER — INSULIN ASPART 100 UNIT/ML ~~LOC~~ SOLN
0.0000 [IU] | Freq: Three times a day (TID) | SUBCUTANEOUS | Status: DC
  Administered 2019-12-02 – 2019-12-03 (×2): 2 [IU] via SUBCUTANEOUS

## 2019-12-01 MED ORDER — SODIUM CHLORIDE 0.9% FLUSH
3.0000 mL | Freq: Two times a day (BID) | INTRAVENOUS | Status: DC
  Administered 2019-12-01: 3 mL via INTRAVENOUS

## 2019-12-01 NOTE — ED Notes (Signed)
Dr. Jeanell Sparrow notified of Trop 166.  Charge RN notified of need for room.

## 2019-12-01 NOTE — ED Triage Notes (Signed)
Reports L sided chest pain that radiates to L arm with SOB and nausea since 9:30am while at church.  Took 2 expired NTG with minimal relief.

## 2019-12-01 NOTE — Progress Notes (Signed)
[place] for heparin dosing Indication: ACS / STEMI  Allergies  Allergen Reactions  . Penicillins Anaphylaxis and Hives     PATIENT HAD A PCN REACTION WITH IMMEDIATE RASH, FACIAL/TONGUE/THROAT SWELLING, SOB, OR LIGHTHEADEDNESS WITH HYPOTENSION:  #  #  #  YES  #  #  #   Has patient had a PCN reaction causing severe rash involving mucus membranes or skin necrosis:Unknown Has patient had a PCN reaction that required hospitalization:No Has patient had a PCN reaction occurring within the last 10 years:No If all of the above answers are "NO", then may proceed with Cephalosporin use.   . Other Other (See Comments)    Walnuts and Coconuts, mouth sores     Patient Measurements: Height: 5\' 5"  (165.1 cm) Weight: 83.6 kg (184 lb 3.2 oz) IBW/kg (Calculated) : 57 Heparin Dosing Weight: 72.7 kg TBW: 81.7 kg (11/07/19 office visit wt)  IBW: 55 kg   Vital Signs: Temp: 98 F (36.7 C) (08/08 1904) Temp Source: Oral (08/08 1904) BP: 135/78 (08/08 1904) Pulse Rate: 87 (08/08 1801)  Labs: Recent Labs    12/01/19 1144 12/01/19 1420 12/01/19 1946  HGB 10.5*  --   --   HCT 33.9*  --   --   PLT 278  --   --   HEPARINUNFRC  --   --  <0.10*  CREATININE 1.33*  --   --   TROPONINIHS 166* 1,818*  --     Estimated Creatinine Clearance: 46.8 mL/min (A) (by C-G formula based on SCr of 1.33 mg/dL (H)).   Medical History: Past Medical History:  Diagnosis Date  . Asthma   . Cancer ([place])    basal cell chest  . Complication of anesthesia   . Coronary artery disease   . Depression   . Diabetes mellitus without complication ([place])    type 2  . Heart murmur   . Hepatitis    auto- immune  . History of kidney stones   . History of nuclear stress test 05/21/2010   dipyridamole; normal perfusion, preserved LV systolic EF of 07%  . Hyperlipidemia   . Hypertension   . Liver failure ([place])    h/o autoimmune hepatitis   . Myocardial infarction ([place])   .  Neuropathy    tingling and burning left leg to foot  . Obesity   . PONV (postoperative nausea and vomiting)   . Psoriasis   . Tubular adenoma of colon    colon path 01/09/2017   Assessment: 64 y.o. F with left sided chest/arm pain with SOB and nausea since 0930 AM. On presentation she was noted to have a troponin of 166, and Scr 1.33, Hgb 10.5, Hct 33.9, Plt 278.   PM f/u: heparin level undetectable.  Per patient report IV heparin line was clamped for a period of time, but resumed around 5 pm.  No overt bleeding or complications noted.  Goal of Therapy:  Heparin level 0.3-0.7 units/ml Monitor platelets by anticoagulation protocol: Yes   Plan:  Increase IV heparin to 1000 units/hr. Check anti-Xa level in 6 hours and daily while on heparin Continue to monitor H&H and platelets  [place] Crane, Vena Austria, BCPS, BCCP Clinical Pharmacist  12/01/2019 9:07 PM   [facility] pharmacy phone numbers are listed on [email or web address]  Please check [email or web address] for unit-specific pharmacy phone numbers.

## 2019-12-01 NOTE — ED Provider Notes (Signed)
[facility] Emergency Department Provider Note MRN:  [id number]  Arrival date & time: 12/01/19     Chief Complaint   Chest Pain   History of Present Illness   Haley George is a 64 y.o. year-old female with a history of diabetes, CAD presenting to the ED with chief complaint of chest pain.  Sudden onset central chest pain starting at about 845 this morning.  Patient was at her church helping with church function, was not exerting herself.  Pain described as a pressure crushing sensation in the center of the chest, radiating to the left arm.  Associated with clamminess and nausea.  Improved with nitroglycerin, which she has not needed to take in 2 years.  Continued mild pain at this time.  No shortness of breath, no fever, cough, no abdominal pain, no other complaints.  Review of Systems  A complete 10 system review of systems was obtained and all systems are negative except as noted in the HPI and PMH.   Patient's Health History    Past Medical History:  Diagnosis Date  . Asthma   . Cancer ([place])    basal cell chest  . Complication of anesthesia   . Coronary artery disease   . Depression   . Diabetes mellitus without complication ([place])    type 2  . Heart murmur   . Hepatitis    auto- immune  . History of kidney stones   . History of nuclear stress test 05/21/2010   dipyridamole; normal perfusion, preserved LV systolic EF of 16%  . Hyperlipidemia   . Hypertension   . Liver failure ([place])    h/o autoimmune hepatitis   . Myocardial infarction ([place])   . Neuropathy    tingling and burning left leg to foot  . Obesity   . PONV (postoperative nausea and vomiting)   . Psoriasis   . Tubular adenoma of colon    colon path 01/09/2017    Past Surgical History:  Procedure Laterality Date  . Carotid Doppler  10/2008   R & L ICAs 0-49% diameter reduction   . COLONOSCOPY    . CORONARY ANGIOPLASTY WITH STENT PLACEMENT  09/2008   2.25x67mm Cypher DES to  in-stent restenosis of prox LAD; Mini-Vision stent x2 2.0x54mm to area distal of initial LAD stent; 3 2.5x84mm Taxus stents prox to Cypher stent in circumflex  . CORONARY ANGIOPLASTY WITH STENT PLACEMENT  09/2005   Cypher DES 3.0x64mm to distal RCA; 2.25x34mm Mini0Vision stent to mid LAD; 2.5x23mm Cypher stent to circumflex  . CORONARY STENT INTERVENTION N/A 11/13/2018   Procedure: CORONARY STENT INTERVENTION;  Surgeon: Jettie Booze, MD;  Location: [place] [facility];  Service: Cardiovascular;  Laterality: N/A;  . LEFT HEART CATH AND CORONARY ANGIOGRAPHY N/A 11/13/2018   Procedure: LEFT HEART CATH AND CORONARY ANGIOGRAPHY;  Surgeon: Jettie Booze, MD;  Location: [place] [facility];  Service: Cardiovascular;  Laterality: N/A;  . LUMBAR LAMINECTOMY/DECOMPRESSION MICRODISCECTOMY Left 10/18/2016   Procedure: Left Lumbar Four-Five Microdiscectomy;  Surgeon: Erline Levine, MD;  Location: [place];  Service: Neurosurgery;  Laterality: Left;  Left L4-5 Microdiscectomy  . ROTATOR CUFF REPAIR Left   . SHOULDER ARTHROSCOPY W/ ROTATOR CUFF REPAIR Right   . TRANSTHORACIC ECHOCARDIOGRAM  04/2010   EF=>55%, borderline conc LVH; trace MR & TR; AV mildly sclerotic; aortic root sclerosis/calcif  . ureteral stents      Family History  Problem Relation Age of Onset  . Diabetes Brother  Social History   Socioeconomic History  . Marital status: Married    Spouse name: Not on file  . Number of children: 1  . Years of education: Not on file  . Highest education level: Not on file  Occupational History    Employer: [employer]  Tobacco Use  . Smoking status: Never Smoker  . Smokeless tobacco: Never Used  Vaping Use  . Vaping Use: Never used  Substance and Sexual Activity  . Alcohol use: No  . Drug use: No  . Sexual activity: Not Currently  Other Topics Concern  . Not on file  Social History Narrative  . Not on file   Social Determinants of Health   Financial Resource  Strain:   . Difficulty of Paying Living Expenses:   Food Insecurity:   . Worried About Charity fundraiser in the Last Year:   . Arboriculturist in the Last Year:   Transportation Needs:   . Film/video editor (Medical):   Marland Kitchen Lack of Transportation (Non-Medical):   Physical Activity:   . Days of Exercise per Week:   . Minutes of Exercise per Session:   Stress:   . Feeling of Stress :   Social Connections:   . Frequency of Communication with Friends and Family:   . Frequency of Social Gatherings with Friends and Family:   . Attends Religious Services:   . Active Member of Clubs or Organizations:   . Attends Archivist Meetings:   Marland Kitchen Marital Status:   Intimate Partner Violence:   . Fear of Current or Ex-Partner:   . Emotionally Abused:   Marland Kitchen Physically Abused:   . Sexually Abused:      Physical Exam   Vitals:   12/01/19 1205  BP: (!) 161/88  Pulse: 90  Resp: 16  Temp: 98.6 F (37 C)  SpO2: 97%    CONSTITUTIONAL: Well-appearing, NAD NEURO:  Alert and oriented x 3, no focal deficits EYES:  eyes equal and reactive ENT/NECK:  no LAD, no JVD CARDIO: Regular rate, well-perfused, normal S1 and S2 PULM:  CTAB no wheezing or rhonchi GI/GU:  normal bowel sounds, non-distended, non-tender MSK/SPINE:  No gross deformities, no edema SKIN:  no rash, atraumatic PSYCH:  Appropriate speech and behavior  *Additional and/or pertinent findings included in MDM below  Diagnostic and Interventional Summary    EKG Interpretation  Date/Time:  Sunday December 01 2019 11:36:51 EDT Ventricular Rate:  93 PR Interval:  114 QRS Duration: 72 QT Interval:  336 QTC Calculation: 417 R Axis:   61 Text Interpretation: Normal sinus rhythm Normal ECG Confirmed by Gerlene Fee [phone]) on 12/01/2019 1:54:41 PM      Labs Reviewed  BASIC METABOLIC PANEL - Abnormal; Notable for the following components:      Result Value   CO2 21 (*)    Glucose, Bld 149 (*)    Creatinine, Ser 1.33 (*)     GFR calc non Af Amer 43 (*)    GFR calc Af Amer 50 (*)    All other components within normal limits  CBC - Abnormal; Notable for the following components:   WBC 3.7 (*)    RBC 3.63 (*)    Hemoglobin 10.5 (*)    HCT 33.9 (*)    All other components within normal limits  TROPONIN I (HIGH SENSITIVITY) - Abnormal; Notable for the following components:   Troponin I (High Sensitivity) 166 (*)    All other components within  normal limits  HEPARIN LEVEL (UNFRACTIONATED)  TROPONIN I (HIGH SENSITIVITY)    DG Chest 2 View  Final Result      Medications  sodium chloride flush (NS) 0.9 % injection 3 mL (has no administration in time range)  heparin bolus via infusion 4,000 Units (has no administration in time range)  heparin ADULT infusion 100 units/mL (25000 units/2109mL sodium chloride 0.45%) (has no administration in time range)  oxyCODONE (Oxy IR/ROXICODONE) immediate release tablet 5 mg (5 mg Oral Given 12/01/19 1414)  aspirin chewable tablet 324 mg (324 mg Oral Given 12/01/19 1414)  nitroGLYCERIN (NITROSTAT) SL tablet 0.4 mg (0.4 mg Sublingual Given 12/01/19 1414)     Procedures  /  Critical Care .Critical Care Performed by: Maudie Flakes, MD Authorized by: Maudie Flakes, MD   Critical care provider statement:    Critical care time (minutes):  33   Critical care was necessary to treat or prevent imminent or life-threatening deterioration of the following conditions: Acute coronary syndrome, non-ST elevation myocardial infarction.   Critical care was time spent personally by me on the following activities:  Discussions with consultants, evaluation of patient's response to treatment, examination of patient, ordering and performing treatments and interventions, ordering and review of laboratory studies, ordering and review of radiographic studies, pulse oximetry, re-evaluation of patient's condition, obtaining history from patient or surrogate and review of old charts    ED Course and  Medical Decision Making  I have reviewed the triage vital signs, the nursing notes, and pertinent available records from the EMR.  Listed above are laboratory and imaging tests that I personally ordered, reviewed, and interpreted and then considered in my medical decision making (see below for details).      Extensive cardiac history here with nonexertional chest pain with troponin elevated at 166.  EKG is overall without ischemic findings.  Patient has a recent catheterization from 1 year ago demonstrating multiple narrowed coronary vessels, will admit to cardiology service, will start aspirin, nitroglycerin, pain control, heparin drip.    Barth Kirks. Sedonia Small, MD [place] [email or web address]  Final Clinical Impressions(s) / ED Diagnoses     ICD-10-CM   1. NSTEMI (non-ST elevated myocardial infarction) [facility])  I21.4     ED Discharge Orders    None       Discharge Instructions Discussed with and Provided to Patient:   Discharge Instructions   None       Maudie Flakes, MD 12/01/19 [id number]

## 2019-12-01 NOTE — H&P (Addendum)
Cardiology Admission History and Physical:   Patient ID: Haley George MRN: [id number]; DOB: 1955-12-04   Admission date: 12/01/2019  Primary Care Provider: Denita Lung, MD [facility] Cardiologist: Sanda Klein, MD  [facility] Electrophysiologist:  None   Chief Complaint: Chest discomfort  Patient Profile:   Haley George is a 64 y.o. female with a history of coronary artery disease-status post coronary stenting.  She presents to the emergency room with ongoing chest pain and positive troponin levels.  History of Present Illness:   Haley George is followed by Dr. Sallyanne Kuster.    She has a history of diabetes mellitus, severe mixed hyperlipidemia, previous PCI, mild diastolic congestive heart failure.  She presented to the emergency room with several hours of chest discomfort.  She has positive troponin levels.  We were called by Dr. Sedonia Small and asked to admit.  Her last heart catheterization was July, 2020.  At that time she had PCI of the LAD with a Cutting Balloon and stent placement to the mid LAD  She has moderate to severe disease in the mid PDA.  The PDA is a small and relatively diffusely diseased vessel and is likely not a good candidate for PCI.  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  The pain was very much like her previous episodes of angina.  It was midsternal and pressure-like sensation.  It was associated with some shortness of breath.  There is no radiation.  She took an old nitroglycerin without any significant relief.  She took a second sublingual nitroglycerin on her way to the hospital again without relief.  In the emergency room she received a new sublingual nitroglycerin with improvement of her symptoms.  She has been on a very aggressive lipid-lowering therapy.  She is on rosuvastatin, fenofibrate, Repatha.  She notes that her diet could be a little bit better.  Past Medical History:  Diagnosis Date  . Asthma    . Cancer ([place])    basal cell chest  . Complication of anesthesia   . Coronary artery disease   . Depression   . Diabetes mellitus without complication ([place])    type 2  . Heart murmur   . Hepatitis    auto- immune  . History of kidney stones   . History of nuclear stress test 05/21/2010   dipyridamole; normal perfusion, preserved LV systolic EF of 44%  . Hyperlipidemia   . Hypertension   . Liver failure ([place])    h/o autoimmune hepatitis   . Myocardial infarction ([place])   . Neuropathy    tingling and burning left leg to foot  . Obesity   . PONV (postoperative nausea and vomiting)   . Psoriasis   . Tubular adenoma of colon    colon path 01/09/2017    Past Surgical History:  Procedure Laterality Date  . Carotid Doppler  10/2008   R & L ICAs 0-49% diameter reduction   . COLONOSCOPY    . CORONARY ANGIOPLASTY WITH STENT PLACEMENT  09/2008   2.25x15m Cypher DES to in-stent restenosis of prox LAD; Mini-Vision stent x2 2.0x117mto area distal of initial LAD stent; 3 2.5x1556maxus stents prox to Cypher stent in circumflex  . CORONARY ANGIOPLASTY WITH STENT PLACEMENT  09/2005   Cypher DES 3.0x22m38m distal RCA; 2.25x12mm63mi0Vision stent to mid LAD; 2.5x18mm 99mer stent to circumflex  . CORONARY STENT INTERVENTION N/A 11/13/2018   Procedure: CORONARY STENT INTERVENTION;  Surgeon: VaranaLarae Grooms  S, MD;  Location: [place] [facility];  Service: Cardiovascular;  Laterality: N/A;  . LEFT HEART CATH AND CORONARY ANGIOGRAPHY N/A 11/13/2018   Procedure: LEFT HEART CATH AND CORONARY ANGIOGRAPHY;  Surgeon: Jettie Booze, MD;  Location: [place] [facility];  Service: Cardiovascular;  Laterality: N/A;  . LUMBAR LAMINECTOMY/DECOMPRESSION MICRODISCECTOMY Left 10/18/2016   Procedure: Left Lumbar Four-Five Microdiscectomy;  Surgeon: Erline Levine, MD;  Location: [place];  Service: Neurosurgery;  Laterality: Left;  Left L4-5 Microdiscectomy  . ROTATOR CUFF REPAIR Left   . SHOULDER ARTHROSCOPY W/  ROTATOR CUFF REPAIR Right   . TRANSTHORACIC ECHOCARDIOGRAM  04/2010   EF=>55%, borderline conc LVH; trace MR & TR; AV mildly sclerotic; aortic root sclerosis/calcif  . ureteral stents       Medications Prior to Admission: Prior to Admission medications   Medication Sig Start Date End Date Taking? Authorizing Provider  Alirocumab (PRALUENT) 150 MG/ML SOAJ Inject 150 mg into the skin every 14 (fourteen) days. 11/07/19   Croitoru, Mihai, MD  Ascorbic Acid (VITAMIN C PO) Take 5,000 mg by mouth daily.     [provider]  aspirin EC 81 MG tablet Take 81 mg by mouth at bedtime.    [provider]  Blood Glucose Monitoring Suppl (ONETOUCH VERIO) w/Device KIT Use to check blood sugar 2 times a day 06/27/19   [name], MD  BREO ELLIPTA 200-25 MCG/INH AEPB TAKE 1 PUFF BY MOUTH EVERY DAY 11/08/18   [provider]  Coenzyme Q10 300 MG CAPS Take 300 mg by mouth daily.    [provider]  Cranberry (SM CRANBERRY) 300 MG tablet Take 300 mg by mouth every evening.    [provider]  fenofibrate (TRICOR) 145 MG tablet TAKE 1 TABLET (145 MG TOTAL) BY MOUTH EVERY EVENING. 11/28/19   Croitoru, Mihai, MD  glipiZIDE (GLUCOTROL) 5 MG tablet TAKE 0.5-1 TABLETS (2.5-5 MG TOTAL) BY MOUTH DAILY BEFORE SUPPER. 08/12/19   [name], MD  glucose blood (ONETOUCH VERIO) test strip Use to check blood sugar 2 times a day 06/27/19   [name], MD  icosapent Ethyl (VASCEPA) 1 g capsule Take 2 capsules (2 g total) by mouth 2 (two) times daily. 03/25/19   Denita Lung, MD  Insulin Detemir (LEVEMIR FLEXTOUCH) 100 UNIT/ML Pen Inject 50 Units into the skin every evening. 01/29/19   [name], MD  Insulin Pen Needle (BD PEN NEEDLE NANO U/F) 32G X 4 MM MISC Use as directed for insulin administration. 06/19/19   [name], MD  isosorbide mononitrate (IMDUR) 30 MG 24 hr tablet TAKE 1 TABLET (30 MG TOTAL) BY MOUTH DAILY. ** DO NOT CRUSH ** 06/28/19   Croitoru,  Dani Gobble, MD  metFORMIN (GLUCOPHAGE) 1000 MG tablet Take 1 tablet (1,000 mg total) by mouth 2 (two) times daily with a meal. 03/25/19   Denita Lung, MD  metoprolol succinate (TOPROL-XL) 25 MG 24 hr tablet Take 1.5 tablets (37.5 mg total) by mouth daily. 07/22/19   Croitoru, Mihai, MD  Multiple Vitamin (MULTIVITAMIN WITH MINERALS) TABS Take 1 tablet by mouth daily.    [provider]  nitroGLYCERIN (NITROSTAT) 0.4 MG SL tablet DISSOLVE 1 TAB UNDER THE TONGUE EVERY 5 MIN AS NEEDED FOR CHEST PAIN (MAX OF 3) 03/27/19   Denita Lung, MD  OTEZLA 30 MG TABS Take 1 tablet by mouth 2 (two) times daily. 06/06/19   [provider]  Probiotic Product (PROBIOTIC PO) Take by mouth.    [provider]  ReliOn Lancets Thin 26G MISC 1 Units by Does not apply route 2 (two) times daily. E 11.9 06/26/19   Gherghe, Cristina, MD  rosuvastatin (CRESTOR) 20 MG tablet TAKE 1 TABLET BY MOUTH EVERY DAY 10/14/19   Croitoru, Mihai, MD  Semaglutide,0.25 or 0.5MG/DOS, (OZEMPIC, 0.25 OR 0.5 MG/DOSE,) 2 MG/1.5ML SOPN Inject 0.5 mg into the skin every Monday. 02/04/19   Gherghe, Cristina, MD  sharps container For disposing of insulin pen needles 06/19/19   Gherghe, Cristina, MD  triamcinolone (KENALOG) 0.025 % ointment Apply 1 application topically 2 (two) times daily. 04/16/19   [provider]  valsartan (DIOVAN) 160 MG tablet TAKE 1 TABLET BY MOUTH EVERY DAY 04/22/19   Lalonde, John C, MD     Allergies:    Allergies  Allergen Reactions  . Penicillins Hives     PATIENT HAD A PCN REACTION WITH IMMEDIATE RASH, FACIAL/TONGUE/THROAT SWELLING, SOB, OR LIGHTHEADEDNESS WITH HYPOTENSION:  #  #  #  YES  #  #  #   Has patient had a PCN reaction causing severe rash involving mucus membranes or skin necrosis:Unknown Has patient had a PCN reaction that required hospitalization:No Has patient had a PCN reaction occurring within the last 10 years:No If all of the above answers are "NO", then may proceed  with Cephalosporin use.     Social History:   Social History   Socioeconomic History  . Marital status: Married    Spouse name: Not on file  . Number of children: 1  . Years of education: Not on file  . Highest education level: Not on file  Occupational History    Employer: [employer]  Tobacco Use  . Smoking status: Never Smoker  . Smokeless tobacco: Never Used  Vaping Use  . Vaping Use: Never used  Substance and Sexual Activity  . Alcohol use: No  . Drug use: No  . Sexual activity: Not Currently  Other Topics Concern  . Not on file  Social History Narrative  . Not on file   Social Determinants of Health   Financial Resource Strain:   . Difficulty of Paying Living Expenses:   Food Insecurity:   . Worried About Running Out of Food in the Last Year:   . Ran Out of Food in the Last Year:   Transportation Needs:   . Lack of Transportation (Medical):   . Lack of Transportation (Non-Medical):   Physical Activity:   . Days of Exercise per Week:   . Minutes of Exercise per Session:   Stress:   . Feeling of Stress :   Social Connections:   . Frequency of Communication with Friends and Family:   . Frequency of Social Gatherings with Friends and Family:   . Attends Religious Services:   . Active Member of Clubs or Organizations:   . Attends Club or Organization Meetings:   . Marital Status:   Intimate Partner Violence:   . Fear of Current or Ex-Partner:   . Emotionally Abused:   . Physically Abused:   . Sexually Abused:     Family History:   The patient's family history includes CAD in her father and mother; Diabetes in her brother.    ROS:  Please see the history of present illness.  All other ROS reviewed and negative.     Physical Exam/Data:   Vitals:   12/01/19 1205  BP: (!) 161/88  Pulse: 90  Resp: 16  Temp: 98.6 F (37 C)  TempSrc: Oral    SpO2: 97%   No intake or output data in the 24 hours ending 12/01/19 1531 Last 3 Weights  11/07/2019 10/31/2019 06/18/2019  Weight (lbs) 180 lb 3.2 oz 181 lb 176 lb 6.4 oz  Weight (kg) 81.738 kg 82.101 kg 80.015 kg     There is no height or weight on file to calculate BMI.  General:  Well nourished, well developed, in no acute distress HEENT: normal Lymph: no adenopathy Neck: no JVD Endocrine:  No thryomegaly Vascular: No carotid bruits; FA pulses 2+ bilaterally without bruits  Cardiac:  normal S1, S2; RRR;  2-5/9 systolic murmur with radiation up into the carotids.   Lungs:  clear to auscultation bilaterally, no wheezing, rhonchi or rales  Abd: soft, nontender, no hepatomegaly  Ext: no edema Musculoskeletal:  No deformities, BUE and BLE strength normal and equal Skin: warm and dry  Neuro:  CNs 2-12 intact, no focal abnormalities noted Psych:  Normal affect    EKG:  Normal sinus rhythm,  No St or T wave changes.   Relevant CV Studies:   Laboratory Data:  High Sensitivity Troponin:   Recent Labs  Lab 12/01/19 1144 12/01/19 1420  TROPONINIHS 166* 1,818*      Chemistry Recent Labs  Lab 12/01/19 1144  NA 139  K 4.8  CL 107  CO2 21*  GLUCOSE 149*  BUN 18  CREATININE 1.33*  CALCIUM 9.6  GFRNONAA 43*  GFRAA 50*  ANIONGAP 11    No results for input(s): PROT, ALBUMIN, AST, ALT, ALKPHOS, BILITOT in the last 168 hours. Hematology Recent Labs  Lab 12/01/19 1144  WBC 3.7*  RBC 3.63*  HGB 10.5*  HCT 33.9*  MCV 93.4  MCH 28.9  MCHC 31.0  RDW 13.5  PLT 278   BNPNo results for input(s): BNP, PROBNP in the last 168 hours.  DDimer No results for input(s): DDIMER in the last 168 hours.   Radiology/Studies:  DG Chest 2 View  Result Date: 12/01/2019 CLINICAL DATA:  Chest pain EXAM: CHEST - 2 VIEW COMPARISON:  11/10/2018 FINDINGS: The heart size and mediastinal contours are within normal limits. Both lungs are clear. The visualized skeletal structures are unremarkable. IMPRESSION: No acute abnormality of the lungs. Electronically Signed   By: Eddie Candle M.D.    On: 12/01/2019 12:28       TIMI Risk Score for Unstable Angina or Non-ST Elevation MI:   The patient's TIMI risk score is 5, which indicates a 26% risk of all cause mortality, new or recurrent myocardial infarction or need for urgent revascularization in the next 14 days.      Assessment and Plan:   1. Non-ST segment elevation myocardial infarction: Haley George presents today with symptoms that are identical to her previous episodes of non-STEMI and unstable angina.  Her pains are mostly reduced with sublingual nitroglycerin.  We will be starting her on IV nitroglycerin.  She is on heparin.  I have personally reviewed her heart catheterization from last July.  She did have some residual disease particularly in the mid posterior descending artery.  This could be the cause of her angina.  She has had multiple stents in the past.  We discussed the risk, benefits, options of heart catheterization.  She understands and agrees to proceed.  2.  Hyperlipidemia: She is on a very aggressive lipid-lowering therapy at this point including Crestor, fenofibrate, her Repatha.  She admits that she could do a little bit better on her diet.  I gave her some information on  the book by Dorthula Nettles, MD -prevent and reverse heart disease.  3.  Hypertension: We will restart her home medications.  She is not had any of her medications yet.  The IV nitroglycerin will also help with her hypertension.  4.  AS - stable     Severity of Illness: The appropriate patient status for this patient is INPATIENT. Inpatient status is judged to be reasonable and necessary in order to provide the required intensity of service to ensure the patient's safety. The patient's presenting symptoms, physical exam findings, and initial radiographic and laboratory data in the context of their chronic comorbidities is felt to place them at high risk for further clinical deterioration. Furthermore, it is not anticipated that the patient will  be medically stable for discharge from the hospital within 2 midnights of admission. The following factors support the patient status of inpatient.   " The patient's presenting symptoms include . " The worrisome physical exam findings include . " The initial radiographic and laboratory data are worrisome because of . " The chronic co-morbidities include .   * I certify that at the point of admission it is my clinical judgment that the patient will require inpatient hospital care spanning beyond 2 midnights from the point of admission due to high intensity of service, high risk for further deterioration and high frequency of surveillance required.*    For questions or updates, please contact [place] Please consult [email or web address] for contact info under     Signed, Mertie Moores, MD  12/01/2019 3:31 PM

## 2019-12-01 NOTE — Progress Notes (Signed)
ANTICOAGULATION CONSULT NOTE - Initial Consult  Pharmacy Consult for heparin dosing Indication: ACS / STEMI  Allergies  Allergen Reactions  . Penicillins Hives     PATIENT HAD A PCN REACTION WITH IMMEDIATE RASH, FACIAL/TONGUE/THROAT SWELLING, SOB, OR LIGHTHEADEDNESS WITH HYPOTENSION:  #  #  #  YES  #  #  #   Has patient had a PCN reaction causing severe rash involving mucus membranes or skin necrosis:Unknown Has patient had a PCN reaction that required hospitalization:No Has patient had a PCN reaction occurring within the last 10 years:No If all of the above answers are "NO", then may proceed with Cephalosporin use.     Patient Measurements:   Heparin Dosing Weight: 72.7 kg TBW: 81.7 kg (11/07/19 office visit wt)  IBW: 55 kg   Vital Signs: Temp: 98.6 F (37 C) (08/08 1205) Temp Source: Oral (08/08 1205) BP: 161/88 (08/08 1205) Pulse Rate: 90 (08/08 1205)  Labs: Recent Labs    12/01/19 1144  HGB 10.5*  HCT 33.9*  PLT 278  CREATININE 1.33*  TROPONINIHS 166*    CrCl cannot be calculated (Unknown ideal weight.).   Medical History: Past Medical History:  Diagnosis Date  . Asthma   . Cancer ([place])    basal cell chest  . Complication of anesthesia   . Coronary artery disease   . Depression   . Diabetes mellitus without complication ([place])    type 2  . Heart murmur   . Hepatitis    auto- immune  . History of kidney stones   . History of nuclear stress test 05/21/2010   dipyridamole; normal perfusion, preserved LV systolic EF of 09%  . Hyperlipidemia   . Hypertension   . Liver failure ([place])    h/o autoimmune hepatitis   . Myocardial infarction ([place])   . Neuropathy    tingling and burning left leg to foot  . Obesity   . PONV (postoperative nausea and vomiting)   . Psoriasis   . Tubular adenoma of colon    colon path 01/09/2017   Assessment: 64 y.o. F with left sided chest/arm pain with SOB and nausea since 0930 AM. On presentation she was noted to have a  troponin of 166, and Scr 1.33, Hgb 10.5, Hct 33.9, Plt 278.   Goal of Therapy:  Heparin level 0.3-0.7 units/ml Monitor platelets by anticoagulation protocol: Yes   Plan:  Give 4000 units bolus x 1 Start heparin infusion at 850 units/hr Check anti-Xa level in 6 hours and daily while on heparin Continue to monitor H&H and platelets  Claudina Lick, PharmD PGY1 [facility] Resident  12/01/2019 2:08 PM  Please check [email or web address] for unit-specific pharmacy phone numbers.

## 2019-12-01 NOTE — ED Notes (Signed)
Attempted to give report x2 

## 2019-12-02 ENCOUNTER — Inpatient Hospital Stay (HOSPITAL_COMMUNITY): Payer: BC Managed Care – PPO

## 2019-12-02 ENCOUNTER — Encounter (HOSPITAL_COMMUNITY)
Admission: EM | Disposition: A | Discharge status: Home / Self Care | Payer: Self-pay | Source: Home / Self Care | Attending: Cardiovascular Disease

## 2019-12-02 DIAGNOSIS — I214 Non-ST elevation (NSTEMI) myocardial infarction: Secondary | ICD-10-CM

## 2019-12-02 DIAGNOSIS — I2510 Atherosclerotic heart disease of native coronary artery without angina pectoris: Secondary | ICD-10-CM

## 2019-12-02 HISTORY — PX: LEFT HEART CATH AND CORONARY ANGIOGRAPHY: CATH118249

## 2019-12-02 HISTORY — PX: CORONARY STENT INTERVENTION: CATH118234

## 2019-12-02 LAB — GLUCOSE, CAPILLARY
Glucose-Capillary: 129 mg/dL — ABNORMAL HIGH (ref 70–99)
Glucose-Capillary: 119 mg/dL — ABNORMAL HIGH (ref 70–99)
Glucose-Capillary: 150 mg/dL — ABNORMAL HIGH (ref 70–99)

## 2019-12-02 LAB — HIV ANTIBODY (ROUTINE TESTING W REFLEX): HIV Screen 4th Generation wRfx: NONREACTIVE

## 2019-12-02 LAB — BASIC METABOLIC PANEL
Anion gap: 9 (ref 5–15)
BUN: 24 mg/dL — ABNORMAL HIGH (ref 8–23)
CO2: 22 mmol/L (ref 22–32)
Calcium: 9.1 mg/dL (ref 8.9–10.3)
Chloride: 106 mmol/L (ref 98–111)
Creatinine, Ser: 1.42 mg/dL — ABNORMAL HIGH (ref 0.44–1.00)
GFR calc Af Amer: 46 mL/min — ABNORMAL LOW (ref >60)
GFR calc non Af Amer: 39 mL/min — ABNORMAL LOW (ref >60)
Glucose, Bld: 190 mg/dL — ABNORMAL HIGH (ref 70–99)
Potassium: 4.3 mmol/L (ref 3.5–5.1)
Sodium: 137 mmol/L (ref 135–145)

## 2019-12-02 LAB — LIPID PANEL
Cholesterol: 134 mg/dL (ref 0–200)
HDL: 20 mg/dL — ABNORMAL LOW (ref >40)
LDL Cholesterol: UNDETERMINED mg/dL (ref 0–99)
Total CHOL/HDL Ratio: 6.7 RATIO
Triglycerides: 534 mg/dL — ABNORMAL HIGH (ref <150)
VLDL: UNDETERMINED mg/dL (ref 0–40)

## 2019-12-02 LAB — CBC
HCT: 30.3 % — ABNORMAL LOW (ref 36.0–46.0)
Hemoglobin: 9.6 g/dL — ABNORMAL LOW (ref 12.0–15.0)
MCH: 29.4 pg (ref 26.0–34.0)
MCHC: 31.7 g/dL (ref 30.0–36.0)
MCV: 92.9 fL (ref 80.0–100.0)
Platelets: 231 10*3/uL (ref 150–400)
RBC: 3.26 MIL/uL — ABNORMAL LOW (ref 3.87–5.11)
RDW: 13.4 % (ref 11.5–15.5)
WBC: 4.3 10*3/uL (ref 4.0–10.5)
nRBC: 0 % (ref 0.0–0.2)

## 2019-12-02 LAB — ECHOCARDIOGRAM LIMITED
Height: 65 in
Weight: 2934.76 oz

## 2019-12-02 LAB — HEPARIN LEVEL (UNFRACTIONATED): Heparin Unfractionated: 0.11 IU/mL — ABNORMAL LOW (ref 0.30–0.70)

## 2019-12-02 LAB — HEMOGLOBIN A1C
Hgb A1c MFr Bld: 6.6 % — ABNORMAL HIGH (ref 4.8–5.6)
Mean Plasma Glucose: 142.72 mg/dL

## 2019-12-02 LAB — OCCULT BLOOD X 1 CARD TO LAB, STOOL: Fecal Occult Bld: NEGATIVE

## 2019-12-02 SURGERY — LEFT HEART CATH AND CORONARY ANGIOGRAPHY
Anesthesia: LOCAL

## 2019-12-02 MED ORDER — MIDAZOLAM HCL 2 MG/2ML IJ SOLN
INTRAMUSCULAR | Status: DC | PRN
  Administered 2019-12-02 (×3): 1 mg via INTRAVENOUS

## 2019-12-02 MED ORDER — MIDAZOLAM HCL 2 MG/2ML IJ SOLN
INTRAMUSCULAR | Status: AC
  Filled 2019-12-02: qty 2

## 2019-12-02 MED ORDER — HEPARIN (PORCINE) IN NACL 1000-0.9 UT/500ML-% IV SOLN
INTRAVENOUS | Status: DC | PRN
  Administered 2019-12-02 (×4): 500 mL

## 2019-12-02 MED ORDER — NITROGLYCERIN 1 MG/10 ML FOR IR/CATH LAB
INTRA_ARTERIAL | Status: AC
  Filled 2019-12-02: qty 10

## 2019-12-02 MED ORDER — ENOXAPARIN SODIUM 40 MG/0.4ML ~~LOC~~ SOLN
40.0000 mg | SUBCUTANEOUS | Status: DC
  Administered 2019-12-03: 40 mg via SUBCUTANEOUS
  Filled 2019-12-02: qty 0.4

## 2019-12-02 MED ORDER — LIDOCAINE HCL (PF) 1 % IJ SOLN
INTRAMUSCULAR | Status: DC | PRN
  Administered 2019-12-02: 2 mL

## 2019-12-02 MED ORDER — HEPARIN SODIUM (PORCINE) 1000 UNIT/ML IJ SOLN
INTRAMUSCULAR | Status: DC | PRN
  Administered 2019-12-02: 4500 [IU] via INTRAVENOUS
  Administered 2019-12-02 (×2): 4000 [IU] via INTRAVENOUS
  Administered 2019-12-02: 2000 [IU] via INTRAVENOUS
  Administered 2019-12-02: 4500 [IU] via INTRAVENOUS

## 2019-12-02 MED ORDER — CLOPIDOGREL BISULFATE 75 MG PO TABS
75.0000 mg | ORAL_TABLET | Freq: Every day | ORAL | Status: DC
  Administered 2019-12-03: 75 mg via ORAL
  Filled 2019-12-02: qty 1

## 2019-12-02 MED ORDER — HEPARIN SODIUM (PORCINE) 1000 UNIT/ML IJ SOLN
INTRAMUSCULAR | Status: AC
  Filled 2019-12-02: qty 1

## 2019-12-02 MED ORDER — VERAPAMIL HCL 2.5 MG/ML IV SOLN
INTRAVENOUS | Status: AC
  Filled 2019-12-02: qty 2

## 2019-12-02 MED ORDER — HEPARIN SODIUM (PORCINE) 1000 UNIT/ML IJ SOLN
INTRAMUSCULAR | Status: DC | PRN
  Administered 2019-12-02: 2000 [IU] via INTRAVENOUS

## 2019-12-02 MED ORDER — ASPIRIN EC 81 MG PO TBEC
81.0000 mg | DELAYED_RELEASE_TABLET | Freq: Every day | ORAL | Status: DC
  Administered 2019-12-02 – 2019-12-03 (×2): 81 mg via ORAL
  Filled 2019-12-02 (×2): qty 1

## 2019-12-02 MED ORDER — HEPARIN BOLUS VIA INFUSION
1000.0000 [IU] | Freq: Once | INTRAVENOUS | Status: AC
  Administered 2019-12-02: 1000 [IU] via INTRAVENOUS
  Filled 2019-12-02: qty 1000

## 2019-12-02 MED ORDER — SODIUM CHLORIDE 0.9 % IV SOLN: INTRAVENOUS | Status: AC

## 2019-12-02 MED ORDER — NITROGLYCERIN 1 MG/10 ML FOR IR/CATH LAB
INTRA_ARTERIAL | Status: DC | PRN
  Administered 2019-12-02 (×3): 200 ug via INTRACORONARY

## 2019-12-02 MED ORDER — SODIUM CHLORIDE 0.9 % IV SOLN: 250.0000 mL | INTRAVENOUS | Status: DC | PRN

## 2019-12-02 MED ORDER — VERAPAMIL HCL 2.5 MG/ML IV SOLN
INTRAVENOUS | Status: DC | PRN
  Administered 2019-12-02 (×2): 10 mL via INTRA_ARTERIAL

## 2019-12-02 MED ORDER — HEPARIN (PORCINE) IN NACL 1000-0.9 UT/500ML-% IV SOLN
INTRAVENOUS | Status: AC
  Filled 2019-12-02: qty 1000

## 2019-12-02 MED ORDER — SODIUM CHLORIDE 0.9% FLUSH
3.0000 mL | Freq: Two times a day (BID) | INTRAVENOUS | Status: DC
  Administered 2019-12-02 – 2019-12-03 (×2): 3 mL via INTRAVENOUS

## 2019-12-02 MED ORDER — SODIUM CHLORIDE 0.9% FLUSH: 3.0000 mL | INTRAVENOUS | Status: DC | PRN

## 2019-12-02 MED ORDER — FENTANYL CITRATE (PF) 100 MCG/2ML IJ SOLN
INTRAMUSCULAR | Status: DC | PRN
  Administered 2019-12-02 (×3): 25 ug via INTRAVENOUS

## 2019-12-02 MED ORDER — LABETALOL HCL 5 MG/ML IV SOLN: 10.0000 mg | INTRAVENOUS | Status: AC | PRN

## 2019-12-02 MED ORDER — LIDOCAINE HCL (PF) 1 % IJ SOLN
INTRAMUSCULAR | Status: AC
  Filled 2019-12-02: qty 30

## 2019-12-02 MED ORDER — IOHEXOL 350 MG/ML SOLN
INTRAVENOUS | Status: DC | PRN
  Administered 2019-12-02: 130 mL

## 2019-12-02 MED ORDER — FENTANYL CITRATE (PF) 100 MCG/2ML IJ SOLN
INTRAMUSCULAR | Status: AC
  Filled 2019-12-02: qty 2

## 2019-12-02 MED ORDER — CLOPIDOGREL BISULFATE 300 MG PO TABS
ORAL_TABLET | ORAL | Status: DC | PRN
  Administered 2019-12-02: 600 mg via ORAL

## 2019-12-02 MED ORDER — CLOPIDOGREL BISULFATE 300 MG PO TABS
ORAL_TABLET | ORAL | Status: AC
  Filled 2019-12-02: qty 2

## 2019-12-02 MED ORDER — HYDRALAZINE HCL 20 MG/ML IJ SOLN: 10.0000 mg | INTRAMUSCULAR | Status: AC | PRN

## 2019-12-02 SURGICAL SUPPLY — 24 items
BALLN SAPPHIRE 2.0X12 (BALLOONS) ×2
BALLN SAPPHIRE ~~LOC~~ 2.5X15 (BALLOONS) ×1 IMPLANT
BALLN SAPPHIRE ~~LOC~~ 2.75X12 (BALLOONS) ×1 IMPLANT
BALLN ~~LOC~~ EUPHORA RX 2.5X8 (BALLOONS) ×2
BALLOON SAPPHIRE 2.0X12 (BALLOONS) IMPLANT
BALLOON ~~LOC~~ EUPHORA RX 2.5X8 (BALLOONS) IMPLANT
CATH 5FR JL3.5 JR4 ANG PIG MP (CATHETERS) ×1 IMPLANT
CATH LAUNCHER 5F EBU3.5 (CATHETERS) ×1 IMPLANT
CATH LAUNCHER 6FR EBU3.5 (CATHETERS) ×1 IMPLANT
DEVICE RAD COMP TR BAND LRG (VASCULAR PRODUCTS) ×1 IMPLANT
GLIDESHEATH SLEND SS 6F .021 (SHEATH) ×1 IMPLANT
GUIDEWIRE INQWIRE 1.5J.035X260 (WIRE) IMPLANT
INQWIRE 1.5J .035X260CM (WIRE) ×2
KIT HEART LEFT (KITS) ×2 IMPLANT
PACK CARDIAC CATHETERIZATION (CUSTOM PROCEDURE TRAY) ×2 IMPLANT
STENT SYNERGY XD 2.25X20 (Permanent Stent) IMPLANT
STENT SYNERGY XD 2.50X12 (Permanent Stent) IMPLANT
SYNERGY XD 2.25X20 (Permanent Stent) ×2 IMPLANT
SYNERGY XD 2.50X12 (Permanent Stent) ×2 IMPLANT
TRANSDUCER W/STOPCOCK (MISCELLANEOUS) ×2 IMPLANT
TUBING CIL FLEX 10 FLL-RA (TUBING) ×2 IMPLANT
WIRE HI TORQ BMW 190CM (WIRE) ×1 IMPLANT
WIRE HI TORQ VERSACORE-J 145CM (WIRE) ×1 IMPLANT
WIRE RUNTHROUGH .014X180CM (WIRE) ×1 IMPLANT

## 2019-12-02 NOTE — Progress Notes (Signed)
Echocardiogram 2D Echocardiogram has been performed.  Haley George 12/02/2019, 1:34 PM

## 2019-12-02 NOTE — Brief Op Note (Signed)
BRIEF CARDIAC CATHETERIZATION NOTE  12/02/2019  11:32 AM  PATIENT:  Haley George  64 y.o. female  PRE-OPERATIVE DIAGNOSIS:  NSTEMI  POST-OPERATIVE DIAGNOSIS:  * No post-op diagnosis entered *  PROCEDURE:  Procedure(s): LEFT HEART CATH AND CORONARY ANGIOGRAPHY (N/A) CORONARY STENT INTERVENTION (N/A)  SURGEON:  Surgeon(s) and Role:    * Nyisha Clippard, Harrell Gave, MD - Primary  FINDINGS: 1. Three-vessel coronary artery disease.  Culprit lesion is 95% in-stent restenosis of the mid LCx.  There is mild to moderate mid/distal LAD in-stent restenosis, 70% de-novo mid LCx disease at OM bifurcation, and 80% rPDA lesion. 2. Upper normal LVEDP. 3. Successful PCI to mid LAD using overlapping Synergy 2.5 x 12 mm and 2.25 x 20 mm drug-eluting stents with 10% residual stenosis and TIMI-3 flow.  RECOMMENDATIONS: 1. DAPT with aspirin and clopidogrel for at least 12 months, ideally indefinitely. 2. Aggressive secondary prevention. 3. Limited echo to reassess LVEF post-MI. 4. Gentle post-cath hydration.  Nelva Bush, MD [facility]

## 2019-12-02 NOTE — Progress Notes (Addendum)
Progress Note  Patient Name: Haley George Date of Encounter: 12/02/2019  Primary Cardiologist: Sanda Klein, MD  Subjective   Had CP up until around 3am but now comfortable. Denies any recent unusual bleeding. Did donate blood about 3 weeks ago and met threshold around Hgb 12. She does recall previously being turned down because she did not meet criteria.   Inpatient Medications    Scheduled Meds: . aspirin EC  81 mg Oral Daily  . fenofibrate  160 mg Oral Daily  . icosapent Ethyl  2 g Oral BID  . insulin aspart  0-15 Units Subcutaneous TID WC  . metoprolol succinate  37.5 mg Oral Daily  . rosuvastatin  20 mg Oral Daily  . sodium chloride flush  3 mL Intravenous Once  . sodium chloride flush  3 mL Intravenous Q12H   Continuous Infusions: . sodium chloride    . sodium chloride 1 mL/kg/hr (12/02/19 0508)  . heparin 1,200 Units/hr (12/02/19 0700)  . nitroGLYCERIN 5 mcg/min (12/02/19 0700)   PRN Meds: sodium chloride, acetaminophen, nitroGLYCERIN, ondansetron (ZOFRAN) IV, sodium chloride flush   Vital Signs    Vitals:   12/01/19 2203 12/01/19 2358 12/02/19 0420 12/02/19 0751  BP: (!) 146/75 140/70  (!) 150/71  Pulse: 80   77  Resp:  _0 Temp:  98.3 F (36.8 C) 98.3 F (36.8 C) 98.6 F (37 C)  TempSrc:  Oral Oral Oral  SpO2:    100%  Weight:   83.2 kg   Height:        Intake/Output Summary (Last 24 hours) at 12/02/2019 0815 Last data filed at 12/02/2019 0700 Gross per 24 hour  Intake 445.17 ml  Output --  Net 445.17 ml   Last 3 Weights 12/02/2019 12/01/2019 11/07/2019  Weight (lbs) 183 lb 6.8 oz 184 lb 3.2 oz 180 lb 3.2 oz  Weight (kg) 83.2 kg 83.553 kg 81.738 kg     Telemetry    NSR - Personally Reviewed  ECG    NSR 93bpm no acute STT changes - Personally Reviewed  Physical Exam   GEN: No acute distress.  HEENT: Normocephalic, atraumatic, sclera non-icteric. Neck: No JVD or bruits. Cardiac: RRR, 2/6 SEM at RUSB, no rubs or gallops.   Radials/DP/PT 1+ and equal bilaterally.  Respiratory: Clear to auscultation bilaterally. Breathing is unlabored. GI: Soft, nontender, non-distended, BS +x 4. MS: no deformity. Extremities: No clubbing or cyanosis. No edema. Distal pedal pulses are 2+ and equal bilaterally. Neuro:  AAOx3. Follows commands. Psych:  Responds to questions appropriately with a normal affect.  Labs    High Sensitivity Troponin:   Recent Labs  Lab 12/01/19 1144 12/01/19 1420  TROPONINIHS 166* 1,818*      Cardiac EnzymesNo results for input(s): TROPONINI in the last 168 hours. No results for input(s): TROPIPOC in the last 168 hours.   Chemistry Recent Labs  Lab 12/01/19 1144 12/02/19 0336  NA 139 137  K 4.8 4.3  CL 107 106  CO2 21* 22  GLUCOSE 149* 190*  BUN 18 24*  CREATININE 1.33* 1.42*  CALCIUM 9.6 9.1  GFRNONAA 43* 39*  GFRAA 50* 46*  ANIONGAP 11 9     Hematology Recent Labs  Lab 12/01/19 1144 12/02/19 0336  WBC 3.7* 4.3  RBC 3.63* 3.26*  HGB 10.5* 9.6*  HCT 33.9* 30.3*  MCV 93.4 92.9  MCH 28.9 29.4  MCHC 31.0 31.7  RDW 13.5 13.4  PLT 278 231    BNPNo results  for input(s): BNP, PROBNP in the last 168 hours.   DDimer No results for input(s): DDIMER in the last 168 hours.   Radiology    DG Chest 2 View  Result Date: 12/01/2019 CLINICAL DATA:  Chest pain EXAM: CHEST - 2 VIEW COMPARISON:  11/10/2018 FINDINGS: The heart size and mediastinal contours are within normal limits. Both lungs are clear. The visualized skeletal structures are unremarkable. IMPRESSION: No acute abnormality of the lungs. Electronically Signed   By: Eddie Candle M.D.   On: 12/01/2019 12:28    Cardiac Studies   2D Echo 10/30/19 1. Left ventricular ejection fraction, by estimation, is 55 to 60%. The  left ventricle has normal function. The left ventricle has no regional  wall motion abnormalities. Left ventricular diastolic parameters are  indeterminate.  2. Right ventricular systolic function is  normal. The right ventricular  size is normal. There is normal pulmonary artery systolic pressure.  3. The mitral valve is normal in structure. Trivial mitral valve  regurgitation. No evidence of mitral stenosis.  4. The aortic valve is tricuspid. Aortic valve regurgitation is not  visualized. Mild aortic valve stenosis.  5. The inferior vena cava is normal in size with greater than 50%  respiratory variability, suggesting right atrial pressure of 3 mmHg.   Conclusion(s)/Recommendation(s): Normal EF, mild AS.   Patient Profile     64 y.o. 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 Last PCI was in 10/2018 with cutting balloon angioplasty/stent placement to mid LAD, moderate-severe disease in PDA not a good candidate for PCI. Admitted with CP, responsible to SL NTG in the ED.  Per OP notes 11/25/19, Plavix recently discontinued after 1 year post-PCI.  Assessment & Plan    1. CAD/NSTEMI - hsTroponins 166->1818 thus far - continue ASA, BB, lipid lowering agents as below, NTG gtt, heparin - plan cath today - risks/benefits reviewed with patient - did review Hgb/Cr with MD  2. Hyperlipidemia - continue fenofibrate, Vascepa, Crestor, Praluent  - consider OP referral to lipidologist within our clinic given continued derangements  3. Essential HTN - TSH with AM labs - holding ARB for cath, may need addition of alternative such as amlodipine in the interim   4. Aortic stenosis  - mild by echo 10/30/19, no acute intervention needed  5. Diabetes mellitus, A1C 6.6 - metformin on hold for cath - continue SSI  6. Anemia  - previous fluctuating Hgbs noted, most recently in the 10 range in 10/2018 - down to 9.6 today without obvious bleeding - pt does report blood donation 3 weeks prior - ordered hemoccult  - check anemia panel in AM, otherwise will need close IP  monitoring and OP f/u  7. 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  Signed, Charlie Pitter, PA-C 12/02/2019, 8:15 AM

## 2019-12-02 NOTE — Interval H&P Note (Signed)
History and Physical Interval Note:  12/02/2019 9:21 AM  Haley George  has presented today for surgery, with the diagnosis of NSTEMI.  The various methods of treatment have been discussed with the patient and family. After consideration of risks, benefits and other options for treatment, the patient has consented to  Procedure(s): LEFT HEART CATH AND CORONARY ANGIOGRAPHY (N/A) as a surgical intervention.  The patient's history has been reviewed, patient examined, no change in status, stable for surgery.  I have reviewed the patient's chart and labs.  Questions were answered to the patient's satisfaction.    Cath Lab Visit (complete for each Cath Lab visit)  Clinical Evaluation Leading to the Procedure:   ACS: Yes.    Non-ACS:  N/A  Tyshauna Finkbiner

## 2019-12-02 NOTE — Progress Notes (Signed)
Patient taken to cath lab at 0900 hrs

## 2019-12-02 NOTE — Progress Notes (Signed)
[place] for heparin dosing Indication: ACS / STEMI  Assessment: 65 y.o. F with left sided chest/arm pain.  Heparin initiaied  Heparin level this am 0.11 units/ml, Hg 9.3, PTLC 231  Goal of Therapy:  Heparin level 0.3-0.7 units/ml Monitor platelets by anticoagulation protocol: Yes   Plan:  Increase IV heparin to 1200 units/hr after 1000 unit bolus Check anti-Xa level in 6 hours and daily while on heparin Continue to monitor H&H and platelets Thanks for allowing pharmacy to be a part of this patient's care.  Excell Seltzer, PharmD Clinical Pharmacist

## 2019-12-02 NOTE — Progress Notes (Signed)
Patient returned from cath lab at 1147hrs.  Right radial site level zero, with TR band in place. Given post cath instructions, patient verbalized understanding.

## 2019-12-03 ENCOUNTER — Other Ambulatory Visit: Payer: Self-pay | Admitting: Physician Assistant

## 2019-12-03 ENCOUNTER — Telehealth: Payer: Self-pay | Admitting: Physician Assistant

## 2019-12-03 ENCOUNTER — Encounter (HOSPITAL_COMMUNITY): Payer: Self-pay | Admitting: Internal Medicine

## 2019-12-03 DIAGNOSIS — N1831 Chronic kidney disease, stage 3a: Secondary | ICD-10-CM

## 2019-12-03 DIAGNOSIS — N183 Chronic kidney disease, stage 3 unspecified: Secondary | ICD-10-CM

## 2019-12-03 DIAGNOSIS — D649 Anemia, unspecified: Secondary | ICD-10-CM

## 2019-12-03 DIAGNOSIS — D638 Anemia in other chronic diseases classified elsewhere: Secondary | ICD-10-CM

## 2019-12-03 LAB — CBC
HCT: 29.3 % — ABNORMAL LOW (ref 36.0–46.0)
Hemoglobin: 9.2 g/dL — ABNORMAL LOW (ref 12.0–15.0)
MCH: 29.1 pg (ref 26.0–34.0)
MCHC: 31.4 g/dL (ref 30.0–36.0)
MCV: 92.7 fL (ref 80.0–100.0)
Platelets: 199 10*3/uL (ref 150–400)
RBC: 3.16 MIL/uL — ABNORMAL LOW (ref 3.87–5.11)
RDW: 13.4 % (ref 11.5–15.5)
WBC: 3.3 10*3/uL — ABNORMAL LOW (ref 4.0–10.5)
nRBC: 0 % (ref 0.0–0.2)

## 2019-12-03 LAB — POCT ACTIVATED CLOTTING TIME
Activated Clotting Time: 202 seconds
Activated Clotting Time: 230 seconds
Activated Clotting Time: 230 seconds
Activated Clotting Time: 257 seconds
Activated Clotting Time: 257 seconds
Activated Clotting Time: 263 seconds

## 2019-12-03 LAB — IRON AND TIBC
Iron: 58 ug/dL (ref 28–170)
Saturation Ratios: 13 % (ref 10.4–31.8)
TIBC: 447 ug/dL (ref 250–450)
UIBC: 389 ug/dL

## 2019-12-03 LAB — BASIC METABOLIC PANEL
Anion gap: 9 (ref 5–15)
BUN: 21 mg/dL (ref 8–23)
CO2: 23 mmol/L (ref 22–32)
Calcium: 8.6 mg/dL — ABNORMAL LOW (ref 8.9–10.3)
Chloride: 107 mmol/L (ref 98–111)
Creatinine, Ser: 1.43 mg/dL — ABNORMAL HIGH (ref 0.44–1.00)
GFR calc Af Amer: 45 mL/min — ABNORMAL LOW (ref >60)
GFR calc non Af Amer: 39 mL/min — ABNORMAL LOW (ref >60)
Glucose, Bld: 139 mg/dL — ABNORMAL HIGH (ref 70–99)
Potassium: 4.5 mmol/L (ref 3.5–5.1)
Sodium: 139 mmol/L (ref 135–145)

## 2019-12-03 LAB — VITAMIN B12: Vitamin B-12: 328 pg/mL (ref 180–914)

## 2019-12-03 LAB — FERRITIN: Ferritin: 37 ng/mL (ref 11–307)

## 2019-12-03 LAB — RETICULOCYTES
Immature Retic Fract: 18.4 % — ABNORMAL HIGH (ref 2.3–15.9)
RBC.: 3.16 MIL/uL — ABNORMAL LOW (ref 3.87–5.11)
Retic Count, Absolute: 93.5 10*3/uL (ref 19.0–186.0)
Retic Ct Pct: 3 % (ref 0.4–3.1)

## 2019-12-03 LAB — FOLATE: Folate: 26.9 ng/mL (ref >5.9)

## 2019-12-03 LAB — GLUCOSE, CAPILLARY
Glucose-Capillary: 171 mg/dL — ABNORMAL HIGH (ref 70–99)
Glucose-Capillary: 134 mg/dL — ABNORMAL HIGH (ref 70–99)

## 2019-12-03 LAB — TSH: TSH: 1.031 u[IU]/mL (ref 0.350–4.500)

## 2019-12-03 MED ORDER — NITROGLYCERIN 0.4 MG SL SUBL: 0.4000 mg | SUBLINGUAL_TABLET | SUBLINGUAL | 3 refills | Status: DC | PRN

## 2019-12-03 MED ORDER — ISOSORBIDE MONONITRATE ER 30 MG PO TB24: 30.0000 mg | ORAL_TABLET | Freq: Every day | ORAL | Status: DC

## 2019-12-03 MED ORDER — ASPIRIN EC 81 MG PO TBEC: 81.0000 mg | DELAYED_RELEASE_TABLET | Freq: Every day | ORAL | Status: DC

## 2019-12-03 MED ORDER — IRBESARTAN 150 MG PO TABS: 150.0000 mg | ORAL_TABLET | Freq: Every day | ORAL | Status: DC

## 2019-12-03 NOTE — Telephone Encounter (Signed)
° ° °  Attention TOC pool,  This patient will need a TOC phone call after discharge. They are being discharged today likely Follow-up appointment has already been arranged with: 12/20/19 with Almyra Deforest (outside 14 day window due to availability but would like phone call) They are a patient of Sanda Klein, MD.  Thank you! Charlie Pitter, PA-C

## 2019-12-03 NOTE — Progress Notes (Signed)
OP Labs.

## 2019-12-03 NOTE — Progress Notes (Signed)
CARDIAC REHAB PHASE I   PRE:  Rate/Rhythm: 82 SR    BP: sitting 149/66    SaO2:   MODE:  Ambulation: 430 ft   POST:  Rate/Rhythm: 125 ST    BP: sitting 145/60     SaO2:   Pt with fast pace. HR elevated, no c/o. Sts she "likes to get things done". Ed completed/reviewed including MI, stents, Plavix importance, NTG, diet, exercise, and CRPII. Pt is somewhat frustrated as she feels like she was doing well then this suddenly happened. Encouraged her to prioritize exercise and also feel comfortable talking with a counselor to help her process. Highly encouraged CRPII, either inperson or virtual. Pt is interested in participating in Virtual Cardiac and Pulmonary Rehab. Pt advised that Virtual Cardiac and Pulmonary Rehab is provided at no cost to the patient.  Checklist:  1. Pt has smart device  ie smartphone and/or ipad for downloading an app  Yes 2. Reliable internet/wifi service    Yes 3. Understands how to use their smartphone and navigate within an app.  Yes Pt verbalized understanding and is in agreement.  [place], ACSM 12/03/2019 9:24 AM

## 2019-12-03 NOTE — Progress Notes (Signed)
Progress Note  Patient Name: Haley George Date of Encounter: 12/03/2019  Primary Cardiologist: Sanda Klein, MD  Subjective   Denies any chest pain. Felt well with walk with cardiac rehab this morning. Acknowledges that this event has shaken her somewhat in that she is fearful of the next potential event given recurrent stenosis.  Inpatient Medications    Scheduled Meds: . aspirin EC  81 mg Oral Daily  . clopidogrel  75 mg Oral Q breakfast  . enoxaparin (LOVENOX) injection  40 mg Subcutaneous Q24H  . fenofibrate  160 mg Oral Daily  . icosapent Ethyl  2 g Oral BID  . insulin aspart  0-15 Units Subcutaneous TID WC  . metoprolol succinate  37.5 mg Oral Daily  . rosuvastatin  20 mg Oral Daily  . sodium chloride flush  3 mL Intravenous Once  . sodium chloride flush  3 mL Intravenous Q12H   Continuous Infusions: . sodium chloride     PRN Meds: sodium chloride, acetaminophen, nitroGLYCERIN, ondansetron (ZOFRAN) IV, sodium chloride flush   Vital Signs    Vitals:   12/02/19 1630 12/02/19 1704 12/02/19 1936 12/03/19 0529  BP: 134/64 103/82 122/60 137/70  Pulse: 87 85 83 80  Resp: 18  17 16   Temp:   98.7 F (37.1 C) 97.7 F (36.5 C)  TempSrc:   Oral Oral  SpO2: 99% 100% 96% 100%  Weight:    83.5 kg  Height:        Intake/Output Summary (Last 24 hours) at 12/03/2019 0858 Last data filed at 12/02/2019 2100 Gross per 24 hour  Intake 1068.95 ml  Output --  Net 1068.95 ml   Last 3 Weights 12/03/2019 12/02/2019 12/01/2019  Weight (lbs) 184 lb 183 lb 6.8 oz 184 lb 3.2 oz  Weight (kg) 83.462 kg 83.2 kg 83.553 kg     Telemetry    NSR - Personally Reviewed  ECG    NSR 77bpm no acute STT changes - Personally Reviewed  Physical Exam   GEN: No acute distress.  HEENT: Normocephalic, atraumatic, sclera non-icteric. Neck: No JVD or bruits. Cardiac: RRR, 2/6 SEM RUSB, no rubs or gallops.  Radials/DP/PT 1+ and equal bilaterally.  Respiratory: Clear to auscultation  bilaterally. Breathing is unlabored. GI: Soft, nontender, non-distended, BS +x 4. MS: no deformity. Extremities: No clubbing or cyanosis. No edema. Distal pedal pulses are 2+ and equal bilaterally. Right radial cath site without hematoma or ecchymosis; good pulse. Neuro:  AAOx3. Follows commands. Psych:  Responds to questions appropriately with a normal affect.  Labs    High Sensitivity Troponin:   Recent Labs  Lab 12/01/19 1144 12/01/19 1420  TROPONINIHS 166* 1,818*      Cardiac EnzymesNo results for input(s): TROPONINI in the last 168 hours. No results for input(s): TROPIPOC in the last 168 hours.   Chemistry Recent Labs  Lab 12/01/19 1144 12/02/19 0336 12/03/19 0412  NA 139 137 139  K 4.8 4.3 4.5  CL 107 106 107  CO2 21* 22 23  GLUCOSE 149* 190* 139*  BUN 18 24* 21  CREATININE 1.33* 1.42* 1.43*  CALCIUM 9.6 9.1 8.6*  GFRNONAA 43* 39* 39*  GFRAA 50* 46* 45*  ANIONGAP 11 9 9      Hematology Recent Labs  Lab 12/01/19 1144 12/01/19 1144 12/02/19 0336 12/03/19 0607  WBC 3.7*  --  4.3 3.3*  RBC 3.63*   < > 3.26* 3.16*  3.16*  HGB 10.5*  --  9.6* 9.2*  HCT 33.9*  --  30.3* 29.3*  MCV 93.4  --  92.9 92.7  MCH 28.9  --  29.4 29.1  MCHC 31.0  --  31.7 31.4  RDW 13.5  --  13.4 13.4  PLT 278  --  231 199   < > = values in this interval not displayed.    BNPNo results for input(s): BNP, PROBNP in the last 168 hours.   DDimer No results for input(s): DDIMER in the last 168 hours.   Radiology    DG Chest 2 View  Result Date: 12/01/2019 CLINICAL DATA:  Chest pain EXAM: CHEST - 2 VIEW COMPARISON:  11/10/2018 FINDINGS: The heart size and mediastinal contours are within normal limits. Both lungs are clear. The visualized skeletal structures are unremarkable. IMPRESSION: No acute abnormality of the lungs. Electronically Signed   By: Eddie Candle M.D.   On: 12/01/2019 12:28   CARDIAC CATHETERIZATION  Result Date: 12/02/2019 Conclusions: 1. Three-vessel coronary artery  disease.  Culprit lesion for NSTEMI is 95% in-stent restenosis of the mid LCx.  There is mild to moderate mid/distal LAD in-stent restenosis, 70% de-novo mid LCx disease at OM2 bifurcation, and 80% rPDA lesion (unchanged from prior catheterization). 2. Upper normal left ventricular filling pressure. 3. Successful PCI to mid LAD using overlapping Synergy 2.5 x 12 mm and 2.25 x 20 mm drug-eluting stents with 10-20% residual stenosis and TIMI-3 flow.  Recommendations: 1. Dual antiplatelet therapy with aspirin and clopidogrel for at least 12 months, ideally indefinitely. 2. Aggressive secondary prevention. 3. Limited echo to reassess LVEF post-MI. 4. Gentle post-cath hydration. Nelva Bush, MD [facility]   ECHOCARDIOGRAM LIMITED  Result Date: 12/02/2019    ECHOCARDIOGRAM LIMITED REPORT   Patient Name:   Haley George Date of Exam: 12/02/2019 Medical Rec #:  [id number]      Height:       65.0 in Accession #:    [id number]     Weight:       183.4 lb Date of Birth:  [birth year]      BSA:          1.907 m Patient Age:    65 years       BP:           147/78 mmHg Patient Gender: F              HR:           79 bpm. Exam Location:  Inpatient Procedure: Limited Echo and Color Doppler Indications:    NSTEMI  History:        Patient has prior history of Echocardiogram examinations, most                 recent 10/30/2019. Risk Factors:Hypertension, Diabetes and                 Dyslipidemia.  Sonographer:    Raquel Sarna Senior RDCS Referring Phys: [place]  1. Left ventricular ejection fraction, by estimation, is 55 to 60%. The left ventricle has normal function. The left ventricle has no regional wall motion abnormalities.  2. Right ventricular systolic function is normal. The right ventricular size is normal.  3. The mitral valve is grossly normal. Trivial mitral valve regurgitation. No evidence of mitral stenosis.  4. The inferior vena cava is normal in size with greater than 50% respiratory variability,  suggesting right atrial pressure of 3 mmHg. Comparison(s): No significant change from prior study. FINDINGS  Left Ventricle: Left ventricular ejection fraction, by estimation,  is 55 to 60%. The left ventricle has normal function. The left ventricle has no regional wall motion abnormalities. Right Ventricle: The right ventricular size is normal. No increase in right ventricular wall thickness. Right ventricular systolic function is normal. Left Atrium: Left atrial size was normal in size. Right Atrium: Right atrial size was normal in size. Pericardium: There is no evidence of pericardial effusion. Mitral Valve: The mitral valve is grossly normal. Trivial mitral valve regurgitation. No evidence of mitral valve stenosis. Tricuspid Valve: The tricuspid valve is grossly normal. Tricuspid valve regurgitation is not demonstrated. No evidence of tricuspid stenosis. Pulmonic Valve: The pulmonic valve was grossly normal. Pulmonic valve regurgitation is not visualized. No evidence of pulmonic stenosis. Venous: The inferior vena cava is normal in size with greater than 50% respiratory variability, suggesting right atrial pressure of 3 mmHg. IAS/Shunts: The atrial septum is grossly normal. RIGHT VENTRICLE RV S prime:     10.40 cm/s TAPSE (M-mode): 1.9 cm Eleonore Chiquito MD Electronically signed by Eleonore Chiquito MD Signature Date/Time: 12/02/2019/3:30:44 PM    Final     Cardiac Studies   LHC 12/02/19 Conclusions: 1. Three-vessel coronary artery disease. Culprit lesion for NSTEMI is 95% in-stent restenosis of the mid LCx. There is mild to moderate mid/distal LAD in-stent restenosis, 70% de-novo mid LCx disease at OM2 bifurcation, and 80% rPDA lesion (unchanged from prior catheterization). 2. Upper normal left ventricular filling pressure. 3. Successful PCI to mid LAD using overlapping Synergy 2.5 x 12 mm and 2.25 x 20 mm drug-eluting stents with 10-20% residual stenosis and TIMI-3 flow.  Recommendations: 1. Dual  antiplatelet therapy with aspirin and clopidogrel for at least 12 months, ideally indefinitely. 2. Aggressive secondary prevention. 3. Limited echo to reassess LVEF post-MI. 4. Gentle post-cath hydration.  Nelva Bush, MD [facility]   Limited echo 12/02/19 for LVEF 1. Left ventricular ejection fraction, by estimation, is 55 to 60%. The  left ventricle has normal function. The left ventricle has no regional  wall motion abnormalities.  2. Right ventricular systolic function is normal. The right ventricular  size is normal.  3. The mitral valve is grossly normal. Trivial mitral valve  regurgitation. No evidence of mitral stenosis.  4. The inferior vena cava is normal in size with greater than 50%  respiratory variability, suggesting right atrial pressure of 3 mmHg.   Comparison(s): No significant change from prior study.   2D Echo 10/30/19 1. Left ventricular ejection fraction, by estimation, is 55 to 60%. The  left ventricle has normal function. The left ventricle has no regional  wall motion abnormalities. Left ventricular diastolic parameters are  indeterminate.  2. Right ventricular systolic function is normal. The right ventricular  size is normal. There is normal pulmonary artery systolic pressure.  3. The mitral valve is normal in structure. Trivial mitral valve  regurgitation. No evidence of mitral stenosis.  4. The aortic valve is tricuspid. Aortic valve regurgitation is not  visualized. Mild aortic valve stenosis.  5. The inferior vena cava is normal in size with greater than 50%  respiratory variability, suggesting right atrial pressure of 3 mmHg.   Conclusion(s)/Recommendation(s): Normal EF, mild AS.   Patient Profile     64 y.o. 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 Last PCI was in 10/2018 with cutting balloon  angioplasty/stent placement to mid LAD, moderate-severe disease in PDA not a good candidate for PCI. Admitted with CP,  responsive to SL NTG in ED, elevated troponin c/w NSTEMI. Per OP notes 11/25/19, Plavix recently discontinued after 1 year post-PCI although patient had not yet discontinued - had planned to finish out last rx bottle.  Assessment & Plan    1. CAD/NSTEMI, hsTroponins [phone] - cath results as above s/p DESx2 to mid Cx (sent message to cath MD to clarify narrative in cath report as it also says LAD) - continue ASA, BB, lipid lowering agents as below - DAPT with ASA/Plavix for at least 12 months, ideally indefinitely - 2D echo with preserved LV function  2. Hyperlipidemia - continue fenofibrate, Vascepa, Crestor, Praluent  - consider OP referral to lipidologist within our clinic at follow-up given continued derangements  3. Aortic stenosis  - mild by echo 10/30/19, no acute intervention needed  4. Diabetes mellitus, A1C 6.6 - metformin on hold for cath for at least 48 hours post procedure - continue SSI  5. Anemia  - previous fluctuating Hgbs noted, most recently in the 10 range in 10/2018 - 10.5->9.6->9.2 this admission without obvious - pt does report blood donation 3 weeks prior, also s/p IVF this admission - ordered hemoccult while inpatient - anemia panel unrevealing - recommend OP f/u with primary care  6. 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 Essential HTN - BP acceptable, managed in context above  F/u OV scheduled next available 12/20/19. Will request TOC phone call as well.  For questions or updates, please contact [place] Please consult [email or web address] for contact info under Cardiology/STEMI.  Signed, Charlie Pitter, PA-C 12/03/2019, 8:58 AM

## 2019-12-03 NOTE — Progress Notes (Signed)
Pt d/c'd today, RN Amor reviewed d/c instructions with pt, copy of instructions and any scripts were given to pt or informed at her pharmacy. Pt educated on radial site care. Pt d/c'd via wheelchair with her belongings, escorted by unit staff.

## 2019-12-03 NOTE — Telephone Encounter (Signed)
Patient currently admitted

## 2019-12-03 NOTE — Plan of Care (Signed)

## 2019-12-03 NOTE — Discharge Summary (Signed)
Discharge Summary    Patient ID: Haley George MRN: [id number]; DOB: Aug 11, 1955  Admit date: 12/01/2019 Discharge date: 12/03/2019  Primary Care Provider: Denita Lung, MD  Primary Cardiologist: Sanda Klein, MD  Primary Electrophysiologist:  None   Discharge Diagnoses    Principal Problem:   NSTEMI (non-ST elevated myocardial infarction) [facility]) Active Problems:   Hypertension associated with diabetes ([place])   Hyperlipidemia associated with type 2 diabetes mellitus ([place])   Obesity (BMI 30-39.9)   Acute kidney injury superimposed on chronic kidney disease ([place])   CAD (coronary artery disease)   Normocytic anemia   Diagnostic Studies/Procedures    LHC 12/02/19 Conclusions: 1. Three-vessel coronary artery disease. Culprit lesion for NSTEMI is 95% in-stent restenosis of the mid LCx. There is mild to moderate mid/distal LAD in-stent restenosis, 70% de-novo mid LCx disease at OM2 bifurcation, and 80% rPDA lesion (unchanged from prior catheterization). 2. Upper normal left ventricular filling pressure. 3. Successful PCI to mid LAD using overlapping Synergy 2.5 x 12 mm and 2.25 x 20 mm drug-eluting stents with 10-20% residual stenosis and TIMI-3 flow.  Recommendations: 1. Dual antiplatelet therapy with aspirin and clopidogrel for at least 12 months, ideally indefinitely. 2. Aggressive secondary prevention. 3. Limited echo to reassess LVEF post-MI. 4. Gentle post-cath hydration.  Nelva Bush, MD [facility]   Limited echo 12/02/19 for LVEF 1. Left ventricular ejection fraction, by estimation, is 55 to 60%. The  left ventricle has normal function. The left ventricle has no regional  wall motion abnormalities.  2. Right ventricular systolic function is normal. The right ventricular  size is normal.  3. The mitral valve is grossly normal. Trivial mitral valve  regurgitation. No evidence of mitral stenosis.  4. The inferior vena cava is normal in size with greater  than 50%  respiratory variability, suggesting right atrial pressure of 3 mmHg.   Comparison(s): No significant change from prior study.   2D Echo 10/30/19 1. Left ventricular ejection fraction, by estimation, is 55 to 60%. The  left ventricle has normal function. The left ventricle has no regional  wall motion abnormalities. Left ventricular diastolic parameters are  indeterminate.  2. Right ventricular systolic function is normal. The right ventricular  size is normal. There is normal pulmonary artery systolic pressure.  3. The mitral valve is normal in structure. Trivial mitral valve  regurgitation. No evidence of mitral stenosis.  4. The aortic valve is tricuspid. Aortic valve regurgitation is not  visualized. Mild aortic valve stenosis.  5. The inferior vena cava is normal in size with greater than 50%  respiratory variability, suggesting right atrial pressure of 3 mmHg.   Conclusion(s)/Recommendation(s): Normal EF, mild AS. _____________   History of Present Illness     Haley George is a 64 y.o. 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 Her last PCI was in 10/2018 with cutting balloon angioplasty/stent placement to mid LAD, with moderate-severe disease in PDA not a good candidate for PCI. Per OP notes 11/25/19, Plavix recently discontinued after 1 year post-PCI although patient had not yet discontinued - had planned to finish out last rx bottle. It was noted on [facility] that she had run out of aspirin recently. She recently had an outpatient echo for surveillance of aortic stenosis which was mild.  She was admitted 12/01/2019 with CP, responsive to SL NTG in ED. Her initial troponin was 166, rising to 1818, felt c/w  NSTEMI. She was placed on IV NTG and heparin and admitted for further management.   Hospital Course     1. CAD/NSTEMI, hsTroponins [phone] - cath  results as above s/p DESx2 to mid Cx - continue ASA, BB, lipid lowering agents as below, resume Imdur today - she will continue DAPT with ASA/Plavix for at least 12 months, ideally indefinitely - limited 2D echo to f/u LVEF 12/02/19 showed normal LVEF    2. Hyperlipidemia - continue fenofibrate, Vascepa, Crestor, Praluent  - consider OP referral to lipidologist within our clinic at follow-up given continued derangements  3. Aortic stenosis  - mild by echo 10/30/19, no acute intervention needed  4. Diabetes mellitus, A1C 6.6 - metformin on hold for cath for at least 48 hours post procedure, to resume 12/05/19 - continue SSI  5. 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 Essential HTN - BP acceptable, managed in context above  The patient feels much better today. She ambulated with cardiac rehab without any acute issues. As above, will have her return to the office in 1 week for repeat BMET/CBC. Follow-up has been arranged next available 12/20/19. Will request TOC phone call as well. Dr . Sallyanne Kuster appears to have already sent in the refill for Plavix. She will continue aspirin as well (OTC). A refill for SL NTG was sent in. Ibuprofen PRN was discontinued due to CKD/antiplatelets. [place] Nail, probiotic and triamcinolone were discontinued off med list as she indicated she was no longer taking these. She was advised to restart metformin the morning of  12/05/19. She indicates she is retired, so work note not needed. Dr. Martinique has seen and examined the patient today and feels she is stable for discharge.  Did the patient have an acute coronary syndrome (MI, NSTEMI, STEMI, etc) this admission?:  Yes                               AHA/ACC Clinical Performance & Quality Measures: 5. Aspirin prescribed? - Yes 6. ADP Receptor Inhibitor (Plavix/Clopidogrel, Brilinta/Ticagrelor or Effient/Prasugrel) prescribed (includes medically managed patients)? - Yes 7. Beta Blocker prescribed? - Yes 8. High Intensity Statin (Lipitor 40-15m or Crestor 20-426m prescribed? - Yes 9. EF assessed during THIS hospitalization? - Yes 10. For EF <40%, was ACEI/ARB prescribed? - Not Applicable (EF >/= 4046%11. For EF <40%, Aldosterone Antagonist (Spironolactone or Eplerenone) prescribed? - Not Applicable (EF >/= 4050%12. Cardiac Rehab Phase II ordered (including medically managed patients)? - Yes   _____________  Discharge Vitals Blood pressure 137/70, pulse 80, temperature 97.7 F (36.5 C), temperature source Oral, resp. rate 16, height _0  (1.651 m), weight 83.5 kg, SpO2 100 %.  Filed Weights   12/01/19 2015 12/02/19 0420 12/03/19 0529  Weight: 83.6 kg 83.2 kg 83.5 kg    Labs & Radiologic Studies    CBC Recent Labs  12/02/19 0336 12/03/19 0607  WBC 4.3 3.3*  HGB 9.6* 9.2*  HCT 30.3* 29.3*  MCV 92.9 92.7  PLT 231 973   Basic Metabolic Panel Recent Labs    12/02/19 0336 12/03/19 0412  NA 137 139  K 4.3 4.5  CL 106 107  CO2 22 23  GLUCOSE 190* 139*  BUN 24* 21  CREATININE 1.42* 1.43*  CALCIUM 9.1 8.6*   High Sensitivity Troponin:   Recent Labs  Lab 12/01/19 1144 12/01/19 1420  TROPONINIHS 166* 1,818*    Hemoglobin A1C Recent Labs    12/02/19 0336  HGBA1C 6.6*   Fasting Lipid Panel Recent Labs    12/02/19 0336  CHOL 134  HDL 20*  LDLCALC UNABLE TO CALCULATE IF TRIGLYCERIDE OVER 400 mg/dL  TRIG 534*  CHOLHDL 6.7    Thyroid Function Tests Recent Labs    12/03/19 0412  TSH 1.031   _____________  DG Chest 2 View  Result Date: 12/01/2019 CLINICAL DATA:  Chest pain EXAM: CHEST - 2 VIEW COMPARISON:  11/10/2018 FINDINGS: The heart size and mediastinal contours are within normal limits. Both lungs are clear. The visualized skeletal structures are unremarkable. IMPRESSION: No acute abnormality of the lungs. Electronically Signed   By: Eddie Candle M.D.   On: 12/01/2019 12:28   CARDIAC CATHETERIZATION  Addendum Date: 12/03/2019   Conclusions: 1. Three-vessel coronary artery disease.  Culprit lesion for NSTEMI is 95% in-stent restenosis of the mid LCx.  There is mild to moderate mid/distal LAD in-stent restenosis, 70% de-novo mid LCx disease at OM2 bifurcation, and 80% rPDA lesion (unchanged from prior catheterization). 2. Upper normal left ventricular filling pressure. 3. Successful PCI to mid LCx using overlapping Synergy 2.5 x 12 mm and 2.25 x 20 mm drug-eluting stents with 10-20% residual stenosis and TIMI-3 flow.  Recommendations: 1. Dual antiplatelet therapy with aspirin and clopidogrel for at least 12 months, ideally indefinitely. 2. Aggressive secondary prevention. 3. Limited echo to reassess LVEF post-MI. 4. Gentle post-cath hydration. Nelva Bush, MD [facility]   Result Date: 12/02/2019 Conclusions: 1. Three-vessel coronary artery disease.  Culprit lesion for NSTEMI is 95% in-stent restenosis of the mid LCx.  There is mild to moderate mid/distal LAD in-stent restenosis, 70% de-novo mid LCx disease at OM2 bifurcation, and 80% rPDA lesion (unchanged from prior catheterization). 2. Upper normal left ventricular filling pressure. 3. Successful PCI to mid LAD using overlapping Synergy 2.5 x 12 mm and 2.25 x 20 mm drug-eluting stents with 10-20% residual stenosis and TIMI-3 flow.  Recommendations: 1. Dual antiplatelet therapy with aspirin and clopidogrel for at least 12 months, ideally indefinitely. 2.  Aggressive secondary prevention. 3. Limited echo to reassess LVEF post-MI. 4. Gentle post-cath hydration. Nelva Bush, MD [facility]   ECHOCARDIOGRAM LIMITED  Result Date: 12/02/2019    ECHOCARDIOGRAM LIMITED REPORT   Patient Name:   Haley George Date of Exam: 12/02/2019 Medical Rec #:  [id number]      Height:       65.0 in Accession #:    [id number]     Weight:       183.4 lb Date of Birth:  1955-09-09      BSA:          1.907 m Patient Age:    64 years       BP:           147/78 mmHg Patient Gender: F              HR:  79 bpm. Exam Location:  Inpatient Procedure: Limited Echo and Color Doppler Indications:    NSTEMI  History:        Patient has prior history of Echocardiogram examinations, most                 recent 10/30/2019. Risk Factors:Hypertension, Diabetes and                 Dyslipidemia.  Sonographer:    Raquel Sarna Senior RDCS Referring Phys: [place]  1. Left ventricular ejection fraction, by estimation, is 55 to 60%. The left ventricle has normal function. The left ventricle has no regional wall motion abnormalities.  2. Right ventricular systolic function is normal. The right ventricular size is normal.  3. The mitral valve is grossly normal. Trivial mitral valve regurgitation. No evidence of mitral stenosis.  4. The inferior vena cava is normal in size with greater than 50% respiratory variability, suggesting right atrial pressure of 3 mmHg. Comparison(s): No significant change from prior study. FINDINGS  Left Ventricle: Left ventricular ejection fraction, by estimation, is 55 to 60%. The left ventricle has normal function. The left ventricle has no regional wall motion abnormalities. Right Ventricle: The right ventricular size is normal. No increase in right ventricular wall thickness. Right ventricular systolic function is normal. Left Atrium: Left atrial size was normal in size. Right Atrium: Right atrial size was normal in size. Pericardium: There is no  evidence of pericardial effusion. Mitral Valve: The mitral valve is grossly normal. Trivial mitral valve regurgitation. No evidence of mitral valve stenosis. Tricuspid Valve: The tricuspid valve is grossly normal. Tricuspid valve regurgitation is not demonstrated. No evidence of tricuspid stenosis. Pulmonic Valve: The pulmonic valve was grossly normal. Pulmonic valve regurgitation is not visualized. No evidence of pulmonic stenosis. Venous: The inferior vena cava is normal in size with greater than 50% respiratory variability, suggesting right atrial pressure of 3 mmHg. IAS/Shunts: The atrial septum is grossly normal. RIGHT VENTRICLE RV S prime:     10.40 cm/s TAPSE (M-mode): 1.9 cm Eleonore Chiquito MD Electronically signed by Eleonore Chiquito MD Signature Date/Time: 12/02/2019/3:30:44 PM    Final    Disposition   Pt is being discharged home today in good condition.  Follow-up Plans & Appointments     Follow-up Information    Almyra Deforest, [place] Follow up.   Specialties: Cardiology, Radiology Why: [facility] - a follow-up appointment has been scheduled for you on Friday December 20, 2019 at 9:15 AM (Arrive by 9:00 AM). Isaac Laud is one of our PAs that works closely with Dr. Sallyanne Kuster. Contact information: [address] [place] [address] [phone]        [facility] Follow up.   Specialty: Cardiology Why: Please come to Dr. [name] office for [facility] on 12/10/19 to have your repeat labs drawn (BMET, CBC). You may come between 8am - 4:30pm (closed 12:45-1:45 for lunch). You do not need to be fasting for this. Contact information: [address] [place] [phone]       Denita Lung, MD Follow up.   Specialty: Family Medicine Why: Please schedule a follow-up visit with your primary care provider for an evaluation of your anemia and mild kidney dysfunction. Contact information: [place]  [address] [phone]              Discharge Instructions    Amb Referral to Cardiac Rehabilitation   Complete by: As directed  Diagnosis:  Coronary Stents NSTEMI PTCA     After initial evaluation and assessments completed: Virtual Based Care may be provided alone or in conjunction with Phase 2 Cardiac Rehab based on patient barriers.: Yes   Diet - low sodium heart healthy   Complete by: As directed    Discharge instructions   Complete by: As directed    You will be continued on aspirin and clopidogrel (Plavix). If you notice any bleeding such as blood in stool, black tarry stools, blood in urine, nosebleeds or any other unusual bleeding, call your doctor immediately. It is not normal to have this kind of bleeding while on a blood thinner and usually indicates there is an underlying problem with one of your body systems that needs to be checked out.   We sent in an updated prescription for your as-needed nitroglycerin.  IMPORTANT: we typically have patients stop their metformin for 48-72 hours post-cath. We would recommend you restart this the morning of 12/05/19.  Patients taking blood thinners should generally stay away from medicines like ibuprofen, Advil, Motrin, naproxen, and Aleve due to risk of stomach bleeding. This is also not a good idea for patients with kidney dysfunction. You may take Tylenol as directed or talk to your primary doctor about alternatives.  We have removed Otezla, probiotic, and triamcinolone ointment from your medicine list since you indicated you're no longer taking these.   Increase activity slowly   Complete by: As directed    No driving for 1 week. No lifting over 10 lbs for 2 weeks. No sexual activity for 2 weeks. Keep procedure site clean & dry. If you notice increased pain, swelling, bleeding or pus, call/return!  You may shower, but no soaking baths/hot tubs/pools for 1 week.      Discharge Medications   Allergies as of 12/03/2019       Reactions   Penicillins Anaphylaxis, Hives   PATIENT HAD A PCN REACTION WITH IMMEDIATE RASH, FACIAL/TONGUE/THROAT SWELLING, SOB, OR LIGHTHEADEDNESS WITH HYPOTENSION:  #  #  #  YES  #  #  #   Has patient had a PCN reaction causing severe rash involving mucus membranes or skin necrosis:Unknown Has patient had a PCN reaction that required hospitalization:No Has patient had a PCN reaction occurring within the last 10 years:No If all of the above answers are "NO", then may proceed with Cephalosporin use.   Other Other (See Comments)   No other allergies per patient.      Medication List    STOP taking these medications   ibuprofen 200 MG tablet Commonly known as: ADVIL   Otezla 30 MG Tabs Generic drug: Apremilast   PROBIOTIC PO   triamcinolone 0.025 % ointment Commonly known as: KENALOG     TAKE these medications   aspirin EC 81 MG tablet Take 1 tablet (81 mg total) by mouth daily. What changed: when to take this   BD Pen Needle Nano U/F 32G X 4 MM Misc Generic drug: Insulin Pen Needle Use as directed for insulin administration.   Breo Ellipta 200-25 MCG/INH Aepb Generic drug: fluticasone furoate-vilanterol Inhale 1 puff into the lungs at bedtime.   CENTRUM SILVER 50+WOMEN PO Take 1 tablet by mouth daily.   clopidogrel 75 MG tablet Commonly known as: PLAVIX TAKE 1 TABLET (75 MG TOTAL) BY MOUTH DAILY WITH BREAKFAST.   Coenzyme Q10 300 MG Caps Take 300 mg by mouth daily.   fenofibrate 145 MG tablet Commonly known as: TRICOR TAKE 1 TABLET (145  MG TOTAL) BY MOUTH EVERY EVENING.   glipiZIDE 5 MG tablet Commonly known as: GLUCOTROL TAKE 0.5-1 TABLETS (2.5-5 MG TOTAL) BY MOUTH DAILY BEFORE SUPPER. What changed:   how much to take  when to take this  additional instructions   GLUCOSAMINE HCL PO Take 1 tablet by mouth daily.   isosorbide mononitrate 30 MG 24 hr tablet Commonly known as: IMDUR TAKE 1 TABLET (30 MG TOTAL) BY MOUTH DAILY. ** DO NOT CRUSH ** What  changed: additional instructions   Levemir FlexTouch 100 UNIT/ML FlexPen Generic drug: insulin detemir Inject 50 Units into the skin every evening.   metFORMIN 1000 MG tablet Commonly known as: GLUCOPHAGE Take 1 tablet (1,000 mg total) by mouth 2 (two) times daily with a meal. Notes to patient: IMPORTANT: you'll need to hold your metformin for at least 48 hours post-cath. We would recommend you restart this the morning of 12/05/19.   metoprolol succinate 25 MG 24 hr tablet Commonly known as: TOPROL-XL Take 1.5 tablets (37.5 mg total) by mouth daily. What changed: how much to take   nitroGLYCERIN 0.4 MG SL tablet Commonly known as: NITROSTAT Place 1 tablet (0.4 mg total) under the tongue every 5 (five) minutes as needed for chest pain (up to 3 doses). What changed: See the new instructions.   OneTouch Verio test strip Generic drug: glucose blood Use to check blood sugar 2 times a day   OneTouch Verio w/Device Kit Use to check blood sugar 2 times a day   Ozempic (0.25 or 0.5 MG/DOSE) 2 MG/1.5ML Sopn Generic drug: Semaglutide(0.25 or 0.5MG/DOS) Inject 0.5 mg into the skin every Monday.   Praluent 150 MG/ML Soaj Generic drug: Alirocumab Inject 150 mg into the skin every 14 (fourteen) days.   ReliOn Lancets Thin 26G Misc 1 Units by Does not apply route 2 (two) times daily. E 11.9   rosuvastatin 20 MG tablet Commonly known as: CRESTOR TAKE 1 TABLET BY MOUTH EVERY DAY   sharps container For disposing of insulin pen needles   SM Cranberry 300 MG tablet Generic drug: Cranberry Take 300 mg by mouth every evening.   valsartan 160 MG tablet Commonly known as: DIOVAN TAKE 1 TABLET BY MOUTH EVERY DAY   Vascepa 1 g capsule Generic drug: icosapent Ethyl Take 2 capsules (2 g total) by mouth 2 (two) times daily.         Outstanding Labs/Studies   BMET/CBC in 1 week as above  Duration of Discharge Encounter   Greater than 30 minutes including physician  time.  Signed, Charlie Pitter, PA-C 12/03/2019, 11:53 AM

## 2019-12-04 NOTE — Telephone Encounter (Signed)
Appointment made for 12/27/19. If she has any concerns before then, she has been advised to call.

## 2019-12-04 NOTE — Telephone Encounter (Signed)
Patient contacted regarding discharge from [facility] on 12/03/19.  Patient cancelled post hospital appointment 8/27 scheduled with Almyra Deforest PA.She only wants to see Dr.Croitoru.  Patient understands discharge instructions  Patient understands medications and regiment.  Patient understands to bring all medications to this visit.  Message sent to Dr.Croitoru's RN for appointment with Dr.Croitoru.

## 2019-12-09 ENCOUNTER — Telehealth (HOSPITAL_COMMUNITY): Payer: Self-pay

## 2019-12-09 NOTE — Telephone Encounter (Signed)
Pt insurance is active and benefits verified through [place]. Co-pay $0.00, DED $1,250.00/$1,250.00 met, out of pocket $4,890.00/$2,545.82 met, co-insurance 20%. No pre-authorization required. Passport, 12/09/19 @ 2:31PM, KSH#[id number]  Will contact patient to see if she is interested in the Cardiac Rehab Program. If interested, patient will need to complete follow up appt. Once completed, patient will be contacted for scheduling upon review by the RN Navigator.

## 2019-12-09 NOTE — Telephone Encounter (Signed)
Attempted to call patient in regards to Cardiac Rehab - LM on VM 

## 2019-12-20 ENCOUNTER — Ambulatory Visit: Payer: BC Managed Care – PPO | Admitting: Physician Assistant

## 2019-12-27 ENCOUNTER — Ambulatory Visit: Payer: BC Managed Care – PPO | Admitting: Cardiovascular Disease

## 2019-12-27 ENCOUNTER — Other Ambulatory Visit: Payer: Self-pay

## 2019-12-27 ENCOUNTER — Encounter: Payer: Self-pay | Admitting: Cardiovascular Disease

## 2019-12-27 VITALS — BP 125/62 | HR 91 | Ht 64.0 in | Wt 176.8 lb

## 2019-12-27 DIAGNOSIS — E782 Mixed hyperlipidemia: Secondary | ICD-10-CM | POA: Diagnosis not present

## 2019-12-27 DIAGNOSIS — I350 Nonrheumatic aortic (valve) stenosis: Secondary | ICD-10-CM

## 2019-12-27 DIAGNOSIS — I2510 Atherosclerotic heart disease of native coronary artery without angina pectoris: Secondary | ICD-10-CM | POA: Diagnosis not present

## 2019-12-27 DIAGNOSIS — I10 Essential (primary) hypertension: Secondary | ICD-10-CM

## 2019-12-27 DIAGNOSIS — E669 Obesity, unspecified: Secondary | ICD-10-CM

## 2019-12-27 DIAGNOSIS — E119 Type 2 diabetes mellitus without complications: Secondary | ICD-10-CM

## 2019-12-27 DIAGNOSIS — Z794 Long term (current) use of insulin: Secondary | ICD-10-CM

## 2019-12-27 NOTE — Patient Instructions (Signed)
Medication Instructions:  No changes *If you need a refill on your cardiac medications before your next appointment, please call your pharmacy*   Lab Work: None ordered If you have labs (blood work) drawn today and your tests are completely normal, you will receive your results only by: . MyChart Message (if you have MyChart) OR . A paper copy in the mail If you have any lab test that is abnormal or we need to change your treatment, we will call you to review the results.   Testing/Procedures: None ordered   Follow-Up: At [facility], you and your health needs are our priority.  As part of our continuing mission to provide you with exceptional heart care, we have created designated Provider Care Teams.  These Care Teams include your primary Cardiologist (physician) and Advanced Practice Providers (APPs -  Physician Assistants and Nurse Practitioners) who all work together to provide you with the care you need, when you need it.  We recommend signing up for the patient portal called "MyChart".  Sign up information is provided on this After Visit Summary.  MyChart is used to connect with patients for Virtual Visits (Telemedicine).  Patients are able to view lab/test results, encounter notes, upcoming appointments, etc.  Non-urgent messages can be sent to your provider as well.   To learn more about what you can do with MyChart, go to [email or web address].    Your next appointment:   3 month(s)  The format for your next appointment:   In Person  Provider:   Mihai Croitoru, MD   

## 2019-12-27 NOTE — Progress Notes (Signed)
Patient ID: Haley George, female   DOB: 1956-03-07, 64 y.o.   MRN: [id number]    Cardiology Office Note    Date:  12/27/2019   ID:  Haley George, DOB 07/11/1955, MRN [id number]  PCP:  Haley Lung, MD  Cardiologist:   Haley Klein, MD   Chief Complaint  Patient presents with  . Coronary Artery Disease    History of Present Illness:  Haley George is a 64 y.o. female with diabetes mellitus type II, severe mixed hyperlipidemia, previous percutaneous revascularization procedures performed in all 3 major coronary arteries and restenosis requiring repeat PCI, mild calcific AS.  Most recent intervention was placement of 2 drug-eluting stents in the mid-distal left circumflex coronary artery covering a de novo mid vessel lesion and a distal in-stent restenosis lesion in the setting of small NSTEMI (peak troponin 1818) on 12/02/2019.  Less than a month following her last office visit she developed unstable angina and she underwent cardiac catheterization on December 02, 2019.  The culprit lesion was found to be a 95% in-stent restenosis in the mid left circumflex coronary artery, as well as a de novo 70% mid left circumflex stenosis at the OM2 bifurcation.  This was treated by placement of overlapping 2.5x12 and 2.25x20 drug-eluting Synergy stents (Dr. Saunders Revel).  Also noted was a mild-moderate mid to distal LAD in-stent restenosis and an unchanged 80% RPDA lesion.  She had complete relief of her angina following this intervention.  She has done well from a cardiac point of view otherwise.  She continues to have steady improvement in all her metabolic parameters with the most recent hemoglobin A1c of 6.6% and an LDL cholesterol in the single digits.  Unfortunately, exactly 1 week ago Haley George, Haley George had sudden cardiac arrest and passed away.  She is currently dealing with all the issues related to her bereavement and trying to care for her daughter who will soon return to her senior year in college.  She  is understandably quite emotional.   Previous coronary interventions 2007  - mid LAD 2.2512 mini vision - left circumflex 2.518 Cypher  - RCA 3.023 Cypher  2010  - mid LAD 2.2512 Taxus overlapping 2.012 mini vision, overlapping another 2.012 mini vision -  left circumflex 2.516 Taxus upstream of previous stent.  July 2020  - mid LAD Synergy 2.5 x12 , overlapping the proximal area of restenosis in the mid LAD (short area of 3 stent layers).  August 2021 - mid LCX overlapping 2.5x12 and 2.25x20 drug-eluting Synergy (the distal stents overlaps the previously placed Taxus stent from 2010)  She had a normal nuclear stress test in November 2016. EF was 56%. She had a "false positive" ECG response.  In July 2020 she had a moderate sized area of ischemia in the anteroseptal wall, EF 52%, Prior to placement of the mid LAD stent.  Past Medical History:  Diagnosis Date  . Asthma   . Cancer ([place])    basal cell chest  . Complication of anesthesia   . Coronary artery disease   . Depression   . Diabetes mellitus without complication ([place])    type 2  . Heart murmur   . Hepatitis    auto- immune  . History of kidney stones   . History of nuclear stress test 05/21/2010   dipyridamole; normal perfusion, preserved LV systolic EF of 10%  . Hyperlipidemia   . Hypertension   . Liver failure ([place])    h/o autoimmune hepatitis   .  Myocardial infarction ([place])   . Neuropathy    tingling and burning left leg to foot  . Obesity   . PONV (postoperative nausea and vomiting)   . Psoriasis   . Tubular adenoma of colon    colon path 01/09/2017    Past Surgical History:  Procedure Laterality Date  . Carotid Doppler  10/2008   R & L ICAs 0-49% diameter reduction   . COLONOSCOPY    . CORONARY ANGIOPLASTY WITH STENT PLACEMENT  09/2008   2.25x63m Cypher DES to in-stent restenosis of prox LAD; Mini-Vision stent x2 2.0x167mto area distal of initial LAD stent; 3 2.5x1513maxus stents prox to  Cypher stent in circumflex  . CORONARY ANGIOPLASTY WITH STENT PLACEMENT  09/2005   Cypher DES 3.0x22m24m distal RCA; 2.25x12mm89mi0Vision stent to mid LAD; 2.5x18mm 23mer stent to circumflex  . CORONARY STENT INTERVENTION N/A 11/13/2018   Procedure: CORONARY STENT INTERVENTION;  Surgeon: VaranaJettie Booze [facility];  Service: Cardiovascular;  Laterality: N/A;  . CORONARY STENT INTERVENTION N/A 12/02/2019   Procedure: CORONARY STENT INTERVENTION;  Surgeon: End, CNelva Bush [facility];  Service: Cardiovascular;  Laterality: N/A;  . LEFT HEART CATH AND CORONARY ANGIOGRAPHY N/A 11/13/2018   Procedure: LEFT HEART CATH AND CORONARY ANGIOGRAPHY;  Surgeon: VaranaJettie Booze [facility];  Service: Cardiovascular;  Laterality: N/A;  . LEFT HEART CATH AND CORONARY ANGIOGRAPHY N/A 12/02/2019   Procedure: LEFT HEART CATH AND CORONARY ANGIOGRAPHY;  Surgeon: End, CNelva Bush [facility];  Service: Cardiovascular;  Laterality: N/A;  . LUMBAR LAMINECTOMY/DECOMPRESSION MICRODISCECTOMY Left 10/18/2016   Procedure: Left Lumbar Four-Five Microdiscectomy;  Surgeon: Stern,Erline Levine Location: [facility];Danielsonvice: Neurosurgery;  Laterality: Left;  Left L4-5 Microdiscectomy  . ROTATOR CUFF REPAIR Left   . SHOULDER ARTHROSCOPY W/ ROTATOR CUFF REPAIR Right   . TRANSTHORACIC ECHOCARDIOGRAM  04/2010   EF=>55%, borderline conc LVH; trace MR & TR; AV mildly sclerotic; aortic root sclerosis/calcif  . ureteral stents      Outpatient Medications Prior to Visit  Medication Sig Dispense Refill  . Alirocumab (PRALUENT) 150 MG/ML SOAJ Inject 150 mg into the skin every 14 (fourteen) days. 6 pen 1  . aspirin EC 81 MG tablet Take 1 tablet (81 mg total) by mouth daily.    . Blood Glucose Monitoring Suppl (ONETOUCH VERIO) w/Device KIT Use to check blood sugar 2 times a day 1 kit 0  . BREO ELLIPTA 200-25 MCG/INH AEPB Inhale 1 puff into the  lungs at bedtime.     . clopidogrel (PLAVIX) 75 MG tablet TAKE 1 TABLET (75 MG TOTAL) BY MOUTH DAILY WITH BREAKFAST. 90 tablet 1  . Coenzyme Q10 300 MG CAPS Take 300 mg by mouth daily.    . Cranberry (SM CRANBERRY) 300 MG tablet Take 300 mg by mouth every evening.    . fenofibrate (TRICOR) 145 MG tablet TAKE 1 TABLET (145 MG TOTAL) BY MOUTH EVERY EVENING. 90 tablet 1  . glipiZIDE (GLUCOTROL) 5 MG tablet TAKE 0.5-1 TABLETS (2.5-5 MG TOTAL) BY MOUTH DAILY BEFORE SUPPER. (Patient taking differently: Take 5 mg by mouth See admin instructions. Take 30 minutes before a heavy meal once daily) 90 tablet 2  . GLUCOSAMINE HCL PO Take 1 tablet by mouth daily.    . glucMarland Kitchense blood (ONETOUCH VERIO) test strip Use to check blood sugar 2 times a day 200 each 12  . icosapent  Ethyl (VASCEPA) 1 g capsule Take 2 capsules (2 g total) by mouth 2 (two) times daily. 360 capsule 3  . Insulin Detemir (LEVEMIR FLEXTOUCH) 100 UNIT/ML Pen Inject 50 Units into the skin every evening. 45 pen 3  . Insulin Pen Needle (BD PEN NEEDLE NANO U/F) 32G X 4 MM MISC Use as directed for insulin administration. 100 each 3  . isosorbide mononitrate (IMDUR) 30 MG 24 hr tablet TAKE 1 TABLET (30 MG TOTAL) BY MOUTH DAILY. ** DO NOT CRUSH ** (Patient taking differently: Take 30 mg by mouth daily. ) 90 tablet 3  . metFORMIN (GLUCOPHAGE) 1000 MG tablet Take 1 tablet (1,000 mg total) by mouth 2 (two) times daily with a meal. 180 tablet 1  . metoprolol succinate (TOPROL-XL) 25 MG 24 hr tablet Take 1.5 tablets (37.5 mg total) by mouth daily. (Patient taking differently: Take 25 mg by mouth daily. ) 135 tablet 3  . Multiple Vitamins-Minerals (CENTRUM SILVER 50+WOMEN PO) Take 1 tablet by mouth daily.    . nitroGLYCERIN (NITROSTAT) 0.4 MG SL tablet Place 1 tablet (0.4 mg total) under the tongue every 5 (five) minutes as needed for chest pain (up to 3 doses). 25 tablet 3  . ReliOn Lancets Thin 26G MISC 1 Units by Does not apply route 2 (two) times daily. E  11.9 100 each 12  . rosuvastatin (CRESTOR) 20 MG tablet TAKE 1 TABLET BY MOUTH EVERY DAY (Patient taking differently: Take 20 mg by mouth daily. ) 90 tablet 3  . Semaglutide,0.25 or 0.5MG/DOS, (OZEMPIC, 0.25 OR 0.5 MG/DOSE,) 2 MG/1.5ML SOPN Inject 0.5 mg into the skin every Monday. 6 pen 3  . sharps container For disposing of insulin pen needles 1 each 2  . valsartan (DIOVAN) 160 MG tablet TAKE 1 TABLET BY MOUTH EVERY DAY (Patient taking differently: Take 160 mg by mouth daily. ) 90 tablet 3   No facility-administered medications prior to visit.     Allergies:   Penicillins and Other   Social History   Socioeconomic History  . Marital status: Married    Spouse name: Not on file  . Number of children: 1  . Years of education: Not on file  . Highest education level: Not on file  Occupational History    Employer: [employer]  Tobacco Use  . Smoking status: Never Smoker  . Smokeless tobacco: Never Used  Vaping Use  . Vaping Use: Never used  Substance and Sexual Activity  . Alcohol use: No  . Drug use: No  . Sexual activity: Not Currently  Other Topics Concern  . Not on file  Social History Narrative  . Not on file   Social Determinants of Health   Financial Resource Strain:   . Difficulty of Paying Living Expenses: Not on file  Food Insecurity:   . Worried About Charity fundraiser in the Last Year: Not on file  . Ran Out of Food in the Last Year: Not on file  Transportation Needs:   . Lack of Transportation (Medical): Not on file  . Lack of Transportation (Non-Medical): Not on file  Physical Activity:   . Days of Exercise per Week: Not on file  . Minutes of Exercise per Session: Not on file  Stress:   . Feeling of Stress : Not on file  Social Connections:   . Frequency of Communication with Friends and Family: Not on file  . Frequency of Social Gatherings with Friends and Family: Not on file  . Attends  Religious Services: Not on file  . Active  Member of Clubs or Organizations: Not on file  . Attends Archivist Meetings: Not on file  . Marital Status: Not on file     ROS:   Please see the history of present illness.    ROS  The patient specifically denies any chest pain at rest exertion, dyspnea at rest or with exertion, orthopnea, paroxysmal nocturnal dyspnea, syncope, palpitations, focal neurological deficits, intermittent claudication, lower extremity edema, unexplained weight gain, cough, hemoptysis or wheezing.   PHYSICAL EXAM:   VS:  BP 125/62   Pulse 91   Ht '5\' 4"'  (1.626 m)   Wt 176 lb 12.8 oz (80.2 kg)   SpO2 98%   BMI 30.35 kg/m      General: Alert, oriented x3, no distress, borderline obese Head: no evidence of trauma, PERRL, EOMI, no exophtalmos or lid lag, no myxedema, no xanthelasma; normal ears, nose and oropharynx Neck: normal jugular venous pulsations and no hepatojugular reflux; brisk carotid pulses without delay and no carotid bruits Chest: clear to auscultation, no signs of consolidation by percussion or palpation, normal fremitus, symmetrical and full respiratory excursions Cardiovascular: normal position and quality of the apical impulse, regular rhythm, normal first and second heart sounds, 2-3/6 early peaking systolic ejection murmur in the aortic focus, no diastolic murmurs, rubs or gallops Abdomen: no tenderness or distention, no masses by palpation, no abnormal pulsatility or arterial bruits, normal bowel sounds, no hepatosplenomegaly Extremities: no clubbing, cyanosis or edema; 2+ radial, ulnar and brachial pulses bilaterally; 2+ right femoral, posterior tibial and dorsalis pedis pulses; 2+ left femoral, posterior tibial and dorsalis pedis pulses; no subclavian or femoral bruits Neurological: grossly nonfocal Psych: Normal mood and affect    Wt Readings from Last 3 Encounters:  12/27/19 176 lb 12.8 oz (80.2 kg)  12/03/19 184 lb (83.5 kg)  11/07/19 180 lb 3.2 oz (81.7 kg)     Studies/Labs Reviewed:   ECHO 10/30/2019:   1. Left ventricular ejection fraction, by estimation, is 55 to 60%. The  left ventricle has normal function. The left ventricle has no regional  wall motion abnormalities. Left ventricular diastolic parameters are  indeterminate.  2. Right ventricular systolic function is normal. The right ventricular  size is normal. There is normal pulmonary artery systolic pressure.  3. The mitral valve is normal in structure. Trivial mitral valve  regurgitation. No evidence of mitral stenosis.  4. The aortic valve is tricuspid. Aortic valve regurgitation is not  visualized. Mild aortic valve stenosis.  5. The inferior vena cava is normal in size with greater than 50%  respiratory variability, suggesting right atrial pressure of 3 mmHg.     12/02/2019 ECHO 1. Left ventricular ejection fraction, by estimation, is 55 to 60%. The  left ventricle has normal function. The left ventricle has no regional  wall motion abnormalities.  2. Right ventricular systolic function is normal. The right ventricular  size is normal.  3. The mitral valve is grossly normal. Trivial mitral valve  regurgitation. No evidence of mitral stenosis.  4. The inferior vena cava is normal in size with greater than 50%  respiratory variability, suggesting right atrial pressure of 3 mmHg.   EKG:  EKG is ordered today and shows sinus rhythm, questionable left atrial abnormality, otherwise completely normal tracing, QTC 447 ms  Recent Labs: 12/03/2019: BUN 21; Creatinine, Ser 1.43; Hemoglobin 9.2; Platelets 199; Potassium 4.5; Sodium 139; TSH 1.031   Lipid Panel    Component Value  Date/Time   CHOL 134 12/02/2019 0336   CHOL 59 (L) 10/22/2019 1041   TRIG 534 (H) 12/02/2019 0336   HDL 20 (L) 12/02/2019 0336   HDL 20 (L) 10/22/2019 1041   CHOLHDL 6.7 12/02/2019 0336   VLDL UNABLE TO CALCULATE IF TRIGLYCERIDE OVER 400 mg/dL 12/02/2019 0336   LDLCALC UNABLE TO CALCULATE IF  TRIGLYCERIDE OVER 400 mg/dL 12/02/2019 0336   LDLCALC 8 10/22/2019 1041   LDLDIRECT 93 11/15/2012 0829    ASSESSMENT:    1. Coronary artery disease involving native coronary artery of native heart without angina pectoris   2. Mixed hyperlipidemia   3. Aortic valve stenosis, nonrheumatic   4. Essential hypertension   5. Insulin-requiring or dependent type II diabetes mellitus ([place])   6. Mild obesity    PLAN:  In order of problems listed above:  1. CAD: Unfortunately, despite remarkable improvement in the risk factor control, she developed de novo lesions and in-stent restenosis in the left circumflex coronary artery.  The most recent stent procedure was 12/02/2019 and she will continue dual antiplatelet with aspirin and clopidogrel at least through August 2022.  Based on the complexity of her coronary disease, is not unreasonable to continue lifelong clopidogrel.  Currently asymptomatic.  On beta-blocker.  On ARB.  I think we can stop her long-acting nitrate. 2. HLP: On a very aggressive lipid-lowering therapy with PCSK9 inhibitor, high-dose statin, Vascepa, fenofibrate.  All her lipid profile parameters have shown improvement compared with past years, but she continues to have very low HDL cholesterol (20) and moderately elevated triglycerides (500). 3. AS: The echocardiogram performed on July 7 still shows mild aortic stenosis.  We will follow up in 3-5 years unless she develops new symptoms. 4. HTN: Excellent control.  Target 130/80 or less. 5. DM: Excellent improvement in glycemic control, with GLP-1 antagonist, but still requiring insulin as well..  Medications are being adjusted by Dr. Cruzita Lederer.  I reassured her that is not surprising that over the last few days her glycemic control has deteriorated since she is dealing with bereavement and her stress hormone levels are understandably very high. 6. Obesity: She is lost quite a bit of weight, recently BMI has stabilized around 30. 7. Reactive  airway disease: Does not have wheezing despite receiving beta-blockers.     Medication Adjustments/Labs and Tests Ordered: Current medicines are reviewed at length with the patient today.  Concerns regarding medicines are outlined above.  Medication changes, Labs and Tests ordered today are listed in the Patient Instructions below. Patient Instructions  Medication Instructions:  No changes *If you need a refill on your cardiac medications before your next appointment, please call your pharmacy*   Lab Work: None ordered If you have labs (blood work) drawn today and your tests are completely normal, you will receive your results only by: Marland Kitchen MyChart Message (if you have MyChart) OR . A paper copy in the mail If you have any lab test that is abnormal or we need to change your treatment, we will call you to review the results.   Testing/Procedures: None ordered   Follow-Up: At [facility], you and your health needs are our priority.  As part of our continuing mission to provide you with exceptional heart care, we have created designated Provider Care Teams.  These Care Teams include your primary Cardiologist (physician) and Advanced Practice Providers (APPs -  Physician Assistants and Nurse Practitioners) who all work together to provide you with the care you need, when you need  it.  We recommend signing up for the patient portal called "MyChart".  Sign up information is provided on this After Visit Summary.  MyChart is used to connect with patients for Virtual Visits (Telemedicine).  Patients are able to view lab/test results, encounter notes, upcoming appointments, etc.  Non-urgent messages can be sent to your provider as well.   To learn more about what you can do with MyChart, go to [email or web address].    Your next appointment:   3 month(s)  The format for your next appointment:   In Person  Provider:   Sanda Klein, MD       Signed, Haley Klein, MD  12/27/2019  5:45 PM    [address], [place], [place]  [address] Phone: [phone]; Fax: [phone]

## 2020-01-18 ENCOUNTER — Other Ambulatory Visit: Payer: Self-pay | Admitting: Internal Medicine

## 2020-01-18 DIAGNOSIS — E118 Type 2 diabetes mellitus with unspecified complications: Secondary | ICD-10-CM

## 2020-01-27 ENCOUNTER — Telehealth: Payer: Self-pay | Admitting: Cardiovascular Disease

## 2020-01-27 ENCOUNTER — Telehealth: Payer: Self-pay | Admitting: Internal Medicine

## 2020-01-27 ENCOUNTER — Telehealth: Payer: Self-pay

## 2020-01-27 ENCOUNTER — Telehealth (HOSPITAL_COMMUNITY): Payer: Self-pay

## 2020-01-27 NOTE — Telephone Encounter (Signed)
Patient called to advise that she is being scheduled for finger surgery with [facility] and that they will be contacting the office shortly to get surgical clearance.

## 2020-01-27 NOTE — Telephone Encounter (Signed)
Dr. Sallyanne Kuster, We have been asked to hold plavix for trigger thumb release. This patient had left heart cath 11/2019 with overlapping stents placed in LCx. Also noted in-stent restenosis in the mid LAD. She is not yet 2 months from PCI. I suspect she should not interrupt DAPT at this time. When is the earliest she may interrupt plavix?

## 2020-01-27 NOTE — Telephone Encounter (Signed)
Called and spoke with pt in regards to CR, pt stated this is not a good time.  Closed referral

## 2020-01-27 NOTE — Telephone Encounter (Signed)
Because the stents were placed during ACS, would not interrupt the plavix for 6 months.

## 2020-01-27 NOTE — Telephone Encounter (Signed)
    Pt is calling, would like to let Dr. Loletha Grayer know, he will receive a clearance request from her provider from her upcoming procedure

## 2020-01-27 NOTE — Telephone Encounter (Signed)
   [facility] Pre-operative Risk Assessment    HEARTCARE STAFF: - Please ensure there is not already an duplicate clearance open for this procedure. - Under Visit Info/Reason for Call, type in Other and utilize the format Clearance MM/DD/YY or Clearance TBD. Do not use dashes or single digits. - If request is for dental extraction, please clarify the # of teeth to be extracted.  Request for surgical clearance:  1. What type of surgery is being performed? Left trigger thumb release   2. When is this surgery scheduled? TBD       3. What type of clearance is required (medical clearance vs. Pharmacy clearance to hold med vs. Both)? Pharmacy  4. Are there any medications that need to be held prior to surgery and how long? Plavix, not listed   5. Practice name and name of physician performing surgery? [facility] Orthopedic specialist, Earlie Server MD   6. What is the office phone number? [phone] ext. [phone])   7.   What is the office fax number? [phone] Attn. Kelly  8.   Anesthesia type (None, local, MAC, general) ? Not Listed   Jacqulynn Cadet 01/27/2020, 4:55 PM  _________________________________________________________________   (provider comments below)

## 2020-01-28 ENCOUNTER — Ambulatory Visit: Payer: BC Managed Care – PPO | Admitting: Family Medicine

## 2020-01-28 ENCOUNTER — Encounter: Payer: Self-pay | Admitting: Family Medicine

## 2020-01-28 ENCOUNTER — Other Ambulatory Visit (HOSPITAL_COMMUNITY)
Admission: RE | Admit: 2020-01-28 | Discharge: 2020-01-28 | Disposition: A | Discharge status: Home / Self Care | Payer: BC Managed Care – PPO | Source: Ambulatory Visit | Attending: Family Medicine | Admitting: Family Medicine

## 2020-01-28 ENCOUNTER — Other Ambulatory Visit: Payer: Self-pay

## 2020-01-28 VITALS — BP 130/80 | HR 86 | Temp 98.4°F | Ht 64.0 in | Wt 173.0 lb

## 2020-01-28 DIAGNOSIS — Z0000 Encounter for general adult medical examination without abnormal findings: Secondary | ICD-10-CM

## 2020-01-28 DIAGNOSIS — E785 Hyperlipidemia, unspecified: Secondary | ICD-10-CM

## 2020-01-28 DIAGNOSIS — Z86010 Personal history of colonic polyps: Secondary | ICD-10-CM

## 2020-01-28 DIAGNOSIS — Z23 Encounter for immunization: Secondary | ICD-10-CM | POA: Diagnosis not present

## 2020-01-28 DIAGNOSIS — E669 Obesity, unspecified: Secondary | ICD-10-CM

## 2020-01-28 DIAGNOSIS — E6609 Other obesity due to excess calories: Secondary | ICD-10-CM

## 2020-01-28 DIAGNOSIS — E1169 Type 2 diabetes mellitus with other specified complication: Secondary | ICD-10-CM

## 2020-01-28 DIAGNOSIS — Z6834 Body mass index (BMI) 34.0-34.9, adult: Secondary | ICD-10-CM

## 2020-01-28 DIAGNOSIS — I2510 Atherosclerotic heart disease of native coronary artery without angina pectoris: Secondary | ICD-10-CM | POA: Diagnosis not present

## 2020-01-28 DIAGNOSIS — I152 Hypertension secondary to endocrine disorders: Secondary | ICD-10-CM

## 2020-01-28 DIAGNOSIS — Z124 Encounter for screening for malignant neoplasm of cervix: Secondary | ICD-10-CM | POA: Insufficient documentation

## 2020-01-28 DIAGNOSIS — E119 Type 2 diabetes mellitus without complications: Secondary | ICD-10-CM

## 2020-01-28 DIAGNOSIS — E118 Type 2 diabetes mellitus with unspecified complications: Secondary | ICD-10-CM | POA: Diagnosis not present

## 2020-01-28 DIAGNOSIS — Z794 Long term (current) use of insulin: Secondary | ICD-10-CM

## 2020-01-28 DIAGNOSIS — F4321 Adjustment disorder with depressed mood: Secondary | ICD-10-CM

## 2020-01-28 DIAGNOSIS — I358 Other nonrheumatic aortic valve disorders: Secondary | ICD-10-CM | POA: Diagnosis not present

## 2020-01-28 DIAGNOSIS — Z1159 Encounter for screening for other viral diseases: Secondary | ICD-10-CM

## 2020-01-28 DIAGNOSIS — M65319 Trigger thumb, unspecified thumb: Secondary | ICD-10-CM

## 2020-01-28 DIAGNOSIS — R0989 Other specified symptoms and signs involving the circulatory and respiratory systems: Secondary | ICD-10-CM

## 2020-01-28 DIAGNOSIS — J8283 Eosinophilic asthma: Secondary | ICD-10-CM

## 2020-01-28 DIAGNOSIS — E1159 Type 2 diabetes mellitus with other circulatory complications: Secondary | ICD-10-CM

## 2020-01-28 LAB — POCT GLYCOSYLATED HEMOGLOBIN (HGB A1C): Hemoglobin A1C: 6.2 % — AB (ref 4.0–5.6)

## 2020-01-28 NOTE — Progress Notes (Signed)
   Subjective:    Patient ID: Haley George, female    DOB: Jul 23, 1955, 64 y.o.   MRN: [id number]  HPI She is here for complete examination. Her husband died roughly 5 weeks ago when she is still grieving from this. She is getting some counseling to help with that. Right now she feels very overwhelmed with everything that needs to be done. She also has had some difficulty with trigger thumb and is having difficulty getting that taken care of due to continuing on Plavix. She recently had an MI requiring further stenting and therefore she is on Plavix. She also continues on Praluent to help with her lipids. She does have a history of colonic polyp and will need a follow-up colonoscopy at 2023. She continues to be followed by Dr. Cruzita Lederer for her diabetes. She also has asthma and is using Breo Ellipta. She is on multiple medicines for her diabetes, hypertension, lipids and lungs. These were all reviewed. She is taking them appropriately. Otherwise her family and social history as well as health maintenance and immunizations was reviewed   Review of Systems  All other systems reviewed and are negative.      Objective:   Physical Exam Alert and in no distress. Tympanic membranes and canals are normal. Pharyngeal area is normal. Neck is supple without adenopathy or thyromegaly; bilateral carotid bruits heard.. Cardiac exam shows a regular sinus rhythm with a systolic murmur heard best along the right sternal border no gallops. Lungs are clear to auscultation. Pelvic exam shows a normal cervix. No masses noted. Pap taken.        Assessment & Plan:  Routine general medical examination at a health care facility  Immunization, viral disease - Plan: Pfizer SARS-COV-2 Vaccine  Need for influenza vaccination - Plan: Flu Vaccine QUAD 36+ mos IM  Diabetes mellitus with complication ([place]) - Plan: POCT glycosylated hemoglobin (Hb A1C)  Hyperlipidemia associated with type 2 diabetes mellitus ([place])  ASHD  (arteriosclerotic heart disease)  Aortic valve sclerosis  Class 1 obesity due to excess calories with serious comorbidity and body mass index (BMI) of 34.0 to 34.9 in adult  Eosinophilic asthma  Insulin-requiring or dependent type II diabetes mellitus ([facility])  Obesity (BMI 30-39.9)  Hypertension associated with diabetes ([place])  Bilateral carotid bruits - Plan: US Carotid Duplex Bilateral  Screening for cervical cancer - Plan: Cytology - PAP([place])  Need for hepatitis C screening test - Plan: Hepatitis C antibody  History of colonic polyps  Grieving  Trigger finger of thumb, unspecified laterality  She will continue on her present medication regimen concerning diabetes, hypertension, pulmonary and follow-up with her specialist in these areas.. She will be seen again in 2023 for colonoscopy. Discussed the grieving with her and strongly encouraged her to continue in counseling with this to help with the death of her husband. Recommend she follow-up with orthopedic surgeon concerning getting the trigger thumb taking. In spite of not being able to come off Plavix.

## 2020-01-28 NOTE — Telephone Encounter (Signed)
   Primary Cardiologist: Sanda Klein, MD  Chart reviewed as part of pre-operative protocol coverage. Patient was contacted 01/28/2020 in reference to pre-operative risk assessment for pending surgery as outlined below.  Haley George was last seen on 12/27/19 by Dr. Sallyanne Kuster.    We have been asked to provide guidance to hold plavix for thumb trigger release surgery. Pt had overlapping DES placed to LCx 11/2019. She may NOT hold plavix for at least 6 months from PCI, ideally 12 months.    I called and spoke with the patient regarding the above. She is adamant that she needs to have the surgery prior to Dec 31 this year. I let her know that Dr. French Ana will reach out to her. Is it possible to do the surgery without holding plavix?  I will route this recommendation to the requesting party via Epic fax function and remove from pre-op pool. Please call with questions.  Tami Lin Genesee Nase, PA 01/28/2020, 9:14 AM

## 2020-01-29 LAB — HEPATITIS C ANTIBODY: Hep C Virus Ab: <0.1 s/co ratio (ref 0.0–0.9)

## 2020-01-30 LAB — CYTOLOGY - PAP: Diagnosis: NEGATIVE

## 2020-02-06 ENCOUNTER — Ambulatory Visit
Admission: RE | Admit: 2020-02-06 | Discharge: 2020-02-06 | Disposition: A | Discharge status: Home / Self Care | Payer: BC Managed Care – PPO | Source: Ambulatory Visit | Attending: Family Medicine | Admitting: Family Medicine

## 2020-02-17 ENCOUNTER — Other Ambulatory Visit (HOSPITAL_COMMUNITY)
Admission: RE | Admit: 2020-02-17 | Discharge: 2020-02-17 | Disposition: A | Discharge status: Home / Self Care | Payer: BC Managed Care – PPO | Source: Ambulatory Visit | Attending: Orthopedic Surgery | Admitting: Orthopedic Surgery

## 2020-02-17 DIAGNOSIS — Z20822 Contact with and (suspected) exposure to covid-19: Secondary | ICD-10-CM | POA: Insufficient documentation

## 2020-02-17 DIAGNOSIS — Z01812 Encounter for preprocedural laboratory examination: Secondary | ICD-10-CM | POA: Insufficient documentation

## 2020-02-17 LAB — SARS CORONAVIRUS 2 (TAT 6-24 HRS): SARS Coronavirus 2: NEGATIVE

## 2020-02-17 NOTE — Anesthesia Preprocedure Evaluation (Addendum)
Anesthesia Evaluation  Patient identified by MRN, date of birth, ID band Patient awake    Reviewed: Allergy & Precautions, NPO status , Patient's Chart, lab work & pertinent test results  Airway Mallampati: II  TM Distance: >3 FB Neck ROM: Full    Dental no notable dental hx. (+) Implants, Teeth Intact, Dental Advisory Given   Pulmonary asthma ,    Pulmonary exam normal breath sounds clear to auscultation       Cardiovascular hypertension, Pt. on medications and Pt. on home beta blockers + angina + CAD, + Past MI and + Cardiac Stents  Normal cardiovascular exam Rhythm:Regular Rate:Normal     Neuro/Psych negative neurological ROS  negative psych ROS   GI/Hepatic   Endo/Other  diabetes  Renal/GU      Musculoskeletal   Abdominal   Peds  Hematology   Anesthesia Other Findings   Reproductive/Obstetrics                           Anesthesia Physical Anesthesia Plan  ASA: III  Anesthesia Plan: Regional   Post-op Pain Management:    Induction:   PONV Risk Score and Plan: Treatment may vary due to age or medical condition  Airway Management Planned: Nasal Cannula and Natural Airway  Additional Equipment: None  Intra-op Plan:   Post-operative Plan:   Informed Consent: I have reviewed the patients History and Physical, chart, labs and discussed the procedure including the risks, benefits and alternatives for the proposed anesthesia with the patient or authorized representative who has indicated his/her understanding and acceptance.     Dental advisory given  Plan Discussed with: Anesthesiologist and CRNA  Anesthesia Plan Comments: (Mac w L supraclavicular block  PAT note by Karoline Caldwell, PA-C: Follows with cardiology for history of severe mixed hyperlipidemia, mild aortic stenosis, CAD status post previous percutaneous revascularization procedures performed in all 3 major coronary  arteries and restenosis requiring repeat PCI.  Most recent intervention was placement of 2 DES in the mid distal left circumflex coronary artery covering a de novo mid vessel lesion and a distal in-stent restenosis lesion in the setting of small NSTEMI on 12/02/2019.  Last seen by Dr. Sallyanne Kuster 12/27/2019.  Per note, asymptomatic from cardiac standpoint.  She continues on aggressive lipid-lowering therapy with PCSK9 inhibitor, high-dose statin, Vascepa, fenofibrate..  Clearance per telephone encounter 01/28/2020, "Chart reviewed as part of pre-operative protocol coverage. Patient was contacted 01/28/2020 in reference to pre-operative risk assessment for pending surgery as outlined below.  Haley George was last seen on 12/27/19 by Dr. Sallyanne Kuster.   We have been asked to provide guidance to hold plavix for thumb trigger release surgery. Pt had overlapping DES placed to LCx 11/2019. She may NOT hold plavix for at least 6 months from PCI, ideally 12 months. I called and spoke with the patient regarding the above. She is adamant that she needs to have the surgery prior to Dec 31 this year. I let her know that Dr. French Ana will reach out to her. Is it possible to do the surgery without holding plavix?"  Per Dr. French Ana, okay to proceed without interrupting Plavix.   History of asthma, maintained on Breo inhaler.  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  Last A1c 6.2 on 01/28/2020.  Case was moved from surgery center to [place] due to cardiac history.  Will need day of surgery labs and evaluation.  EKG 12/27/2019: NSR.  Rate 91.  Possible LAE.  CHEST - 2 VIEW 12/01/2019: COMPARISON:  11/10/2018  FINDINGS: The heart size and mediastinal contours are within normal limits. Both lungs are clear. The visualized skeletal structures are unremarkable.  IMPRESSION: No acute abnormality of the lungs.  Cath and PCI 12/02/2019: Conclusions: Three-vessel coronary artery disease. Culprit lesion for NSTEMI is 95% in-stent  restenosis of the mid LCx. There is mild to moderate mid/distal LAD in-stent restenosis, 70% de-novo mid LCx disease at OM2 bifurcation, and 80% rPDA lesion (unchanged from prior catheterization). Upper normal left ventricular filling pressure. Successful PCI to mid LCx using overlapping Synergy 2.5 x 12 mm and 2.25 x 20 mm drug-eluting stents with 10-20% residual stenosis and TIMI-3 flow.  Recommendations: Dual antiplatelet therapy with aspirin and clopidogrel for at least 12 months, ideally indefinitely. Aggressive secondary prevention. Limited echo to reassess LVEF post-MI. Gentle post-cath hydration.  Limited echo 12/02/2019 post PCI: 1. Left ventricular ejection fraction, by estimation, is 55 to 60%. The  left ventricle has normal function. The left ventricle has no regional  wall motion abnormalities.  2. Right ventricular systolic function is normal. The right ventricular  size is normal.  3. The mitral valve is grossly normal. Trivial mitral valve  regurgitation. No evidence of mitral stenosis.  4. The inferior vena cava is normal in size with greater than 50%  respiratory variability, suggesting right atrial pressure of 3 mmHg.   Comparison(s): No significant change from prior study.   TTE 10/30/2019: 1. Left ventricular ejection fraction, by estimation, is 55 to 60%. The  left ventricle has normal function. The left ventricle has no regional  wall motion abnormalities. Left ventricular diastolic parameters are  indeterminate.  2. Right ventricular systolic function is normal. The right ventricular  size is normal. There is normal pulmonary artery systolic pressure.  3. The mitral valve is normal in structure. Trivial mitral valve  regurgitation. No evidence of mitral stenosis.  4. The aortic valve is tricuspid. Aortic valve regurgitation is not  visualized. Mild aortic valve stenosis.  5. The inferior vena cava is normal in size with greater than 50%  respiratory  variability, suggesting right atrial pressure of 3 mmHg.   Conclusion(s)/Recommendation(s): Normal EF, mild AS.  )      Anesthesia Quick Evaluation

## 2020-02-17 NOTE — Progress Notes (Signed)
Anesthesia Chart Review: Same day workup  Follows with cardiology for history of severe mixed hyperlipidemia, mild aortic stenosis, CAD status post previous percutaneous revascularization procedures performed in all 3 major coronary arteries and restenosis requiring repeat PCI.  Most recent intervention was placement of 2 DES in the mid distal left circumflex coronary artery covering a de novo mid vessel lesion and a distal in-stent restenosis lesion in the setting of small NSTEMI on 12/02/2019.  Last seen by Dr. Sallyanne Kuster 12/27/2019.  Per note, asymptomatic from cardiac standpoint.  She continues on aggressive lipid-lowering therapy with PCSK9 inhibitor, high-dose statin, Vascepa, fenofibrate..  Clearance per telephone encounter 01/28/2020, "Chart reviewed as part of pre-operative protocol coverage. Patient was contacted 01/28/2020 in reference to pre-operative risk assessment for pending surgery as outlined below.  Haley George was last seen on 12/27/19 by Dr. Sallyanne Kuster.   We have been asked to provide guidance to hold plavix for thumb trigger release surgery. Pt had overlapping DES placed to LCx 11/2019. She may NOT hold plavix for at least 6 months from PCI, ideally 12 months. I called and spoke with the patient regarding the above. She is adamant that she needs to have the surgery prior to Dec 31 this year. I let her know that Dr. French Ana will reach out to her. Is it possible to do the surgery without holding plavix?"  Per Dr. French Ana, okay to proceed without interrupting Plavix.   History of asthma, maintained on Breo inhaler.  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  Last A1c 6.2 on 01/28/2020.  Case was moved from surgery center to [place] due to cardiac history.  Will need day of surgery labs and evaluation.  EKG 12/27/2019: NSR.  Rate 91.  Possible LAE.  CHEST - 2 VIEW 12/01/2019: COMPARISON:  11/10/2018  FINDINGS: The heart size and mediastinal contours are within normal limits. Both lungs are  clear. The visualized skeletal structures are unremarkable.  IMPRESSION: No acute abnormality of the lungs.  Cath and PCI 12/02/2019: Conclusions: 1. Three-vessel coronary artery disease. Culprit lesion for NSTEMI is 95% in-stent restenosis of the mid LCx. There is mild to moderate mid/distal LAD in-stent restenosis, 70% de-novo mid LCx disease at OM2 bifurcation, and 80% rPDA lesion (unchanged from prior catheterization). 2. Upper normal left ventricular filling pressure. 3. Successful PCI to mid LCx using overlapping Synergy 2.5 x 12 mm and 2.25 x 20 mm drug-eluting stents with 10-20% residual stenosis and TIMI-3 flow.  Recommendations: 1. Dual antiplatelet therapy with aspirin and clopidogrel for at least 12 months, ideally indefinitely. 2. Aggressive secondary prevention. 3. Limited echo to reassess LVEF post-MI. 4. Gentle post-cath hydration.  Limited echo 12/02/2019 post PCI: 1. Left ventricular ejection fraction, by estimation, is 55 to 60%. The  left ventricle has normal function. The left ventricle has no regional  wall motion abnormalities.  2. Right ventricular systolic function is normal. The right ventricular  size is normal.  3. The mitral valve is grossly normal. Trivial mitral valve  regurgitation. No evidence of mitral stenosis.  4. The inferior vena cava is normal in size with greater than 50%  respiratory variability, suggesting right atrial pressure of 3 mmHg.   Comparison(s): No significant change from prior study.   TTE 10/30/2019: 1. Left ventricular ejection fraction, by estimation, is 55 to 60%. The  left ventricle has normal function. The left ventricle has no regional  wall motion abnormalities. Left ventricular diastolic parameters are  indeterminate.  2. Right ventricular systolic function is normal.  The right ventricular  size is normal. There is normal pulmonary artery systolic pressure.  3. The mitral valve is normal in structure. Trivial  mitral valve  regurgitation. No evidence of mitral stenosis.  4. The aortic valve is tricuspid. Aortic valve regurgitation is not  visualized. Mild aortic valve stenosis.  5. The inferior vena cava is normal in size with greater than 50%  respiratory variability, suggesting right atrial pressure of 3 mmHg.   Conclusion(s)/Recommendation(s): Normal EF, mild AS.    Wynonia Musty [facility] Short Stay Center/Anesthesiology Phone [phone] 02/17/2020 4:06 PM

## 2020-02-18 ENCOUNTER — Ambulatory Visit: Payer: Self-pay | Admitting: Orthopedic Surgery

## 2020-02-18 ENCOUNTER — Encounter (HOSPITAL_COMMUNITY): Payer: Self-pay | Admitting: Orthopedic Surgery

## 2020-02-18 NOTE — H&P (Signed)
Haley George is an 64 y.o. female.   Chief Complaint: left trigger thumb HPI: patient with recurrent triggering of the left thumb.  Has failed 3 injections, did well with the first not as well with the following 2.    Past Medical History:  Diagnosis Date  . Asthma   . Cancer ([place])    basal cell chest  . Complication of anesthesia   . Coronary artery disease   . Diabetes mellitus without complication ([place])    type 2  . Heart murmur   . Hepatitis    auto- immune  . History of kidney stones   . History of nuclear stress test 05/21/2010   dipyridamole; normal perfusion, preserved LV systolic EF of 46%  . Hyperlipidemia   . Hypertension   . Liver failure ([place])    h/o autoimmune hepatitis   . Myocardial infarction ([place])   . Neuropathy    resolved with back surgery; tingling and burning left leg to foot  . Obesity   . PONV (postoperative nausea and vomiting)   . Psoriasis   . Tubular adenoma of colon    colon path 01/09/2017    Past Surgical History:  Procedure Laterality Date  . Carotid Doppler  10/2008   R & L ICAs 0-49% diameter reduction   . COLONOSCOPY    . CORONARY ANGIOPLASTY WITH STENT PLACEMENT  09/2008   2.25x16mm Cypher DES to in-stent restenosis of prox LAD; Mini-Vision stent x2 2.0x31mm to area distal of initial LAD stent; 3 2.5x70mm Taxus stents prox to Cypher stent in circumflex  . CORONARY ANGIOPLASTY WITH STENT PLACEMENT  09/2005   Cypher DES 3.0x55mm to distal RCA; 2.25x48mm Mini0Vision stent to mid LAD; 2.5x18mm Cypher stent to circumflex  . CORONARY STENT INTERVENTION N/A 11/13/2018   Procedure: CORONARY STENT INTERVENTION;  Surgeon: Jettie Booze, MD;  Location: [facility];  Service: Cardiovascular;  Laterality: N/A;  . CORONARY STENT INTERVENTION N/A 12/02/2019   Procedure: CORONARY STENT INTERVENTION;  Surgeon: Nelva Bush, MD;  Location: [facility];  Service: Cardiovascular;  Laterality: N/A;  . LEFT HEART CATH AND CORONARY  ANGIOGRAPHY N/A 11/13/2018   Procedure: LEFT HEART CATH AND CORONARY ANGIOGRAPHY;  Surgeon: Jettie Booze, MD;  Location: [facility];  Service: Cardiovascular;  Laterality: N/A;  . LEFT HEART CATH AND CORONARY ANGIOGRAPHY N/A 12/02/2019   Procedure: LEFT HEART CATH AND CORONARY ANGIOGRAPHY;  Surgeon: Nelva Bush, MD;  Location: [facility];  Service: Cardiovascular;  Laterality: N/A;  . LUMBAR LAMINECTOMY/DECOMPRESSION MICRODISCECTOMY Left 10/18/2016   Procedure: Left Lumbar Four-Five Microdiscectomy;  Surgeon: Erline Levine, MD;  Location: [facility];  Service: Neurosurgery;  Laterality: Left;  Left L4-5 Microdiscectomy  . ROTATOR CUFF REPAIR Left   . SHOULDER ARTHROSCOPY W/ ROTATOR CUFF REPAIR Right   . TRANSTHORACIC ECHOCARDIOGRAM  04/2010   EF=>55%, borderline conc LVH; trace MR & TR; AV mildly sclerotic; aortic root sclerosis/calcif  . ureteral stents      Family History  Problem Relation Age of Onset  . CAD Mother   . CAD Father   . Diabetes Brother    Social History:  reports that she has never smoked. She has never used smokeless tobacco. She reports that she does not drink alcohol and does not use drugs.  Allergies:  Allergies  Allergen Reactions  . Penicillins Anaphylaxis and Hives     PATIENT HAD A PCN REACTION WITH IMMEDIATE RASH, FACIAL/TONGUE/THROAT SWELLING, SOB, OR LIGHTHEADEDNESS WITH HYPOTENSION:  #  #  #  YES  #  #  #   Has patient had a PCN reaction causing severe rash involving mucus membranes or skin necrosis:Unknown Has patient had a PCN reaction that required hospitalization:No Has patient had a PCN reaction occurring within the last 10 years:No If all of the above answers are "NO", then may proceed with Cephalosporin use.   Marland Kitchen Coconut Flavor     Mouth breaks out   . Other Other (See Comments)    Walnuts cause mouth to break out    (Not in a hospital admission)   Results for orders placed or performed during the hospital encounter of  02/17/20 (from the past 48 hour(s))  SARS CORONAVIRUS 2 (TAT 6-24 HRS) Nasopharyngeal Nasopharyngeal Swab     Status: None   Collection Time: 02/17/20  3:36 PM   Specimen: Nasopharyngeal Swab  Result Value Ref Range   SARS Coronavirus 2 NEGATIVE NEGATIVE    Comment: (NOTE) SARS-CoV-2 target nucleic acids are NOT DETECTED.  The SARS-CoV-2 RNA is generally detectable in upper and lower respiratory specimens during the acute phase of infection. Negative results do not preclude SARS-CoV-2 infection, do not rule out co-infections with other pathogens, and should not be used as the sole basis for treatment or other patient management decisions. Negative results must be combined with clinical observations, patient history, and epidemiological information. The expected result is Negative.  Fact Sheet for Patients: SugarRoll.be  Fact Sheet for Healthcare Providers: https://www.woods-mathews.com/  This test is not yet approved or cleared by the [place] FDA and  has been authorized for detection and/or diagnosis of SARS-CoV-2 by FDA under an Emergency Use Authorization (EUA). This EUA will remain  in effect (meaning this test can be used) for the duration of the COVID-19 declaration under Se ction 564(b)(1) of the Act, 21 U.S.C. section 360bbb-3(b)(1), unless the authorization is terminated or revoked sooner.  Performed at [facility], [place] [address] [place], [place] [address]    No results found.  Review of Systems  HENT: Positive for tinnitus.   Respiratory: Positive for shortness of breath.   Musculoskeletal: Positive for joint swelling and myalgias.  All other systems reviewed and are negative.   There were no vitals taken for this visit. Physical Exam Constitutional:      General: She is not in acute distress.    Appearance: Normal appearance.  HENT:     Head: Normocephalic and atraumatic.  Eyes:     Extraocular  Movements: Extraocular movements intact.     Pupils: Pupils are equal, round, and reactive to light.  Cardiovascular:     Rate and Rhythm: Normal rate.     Pulses: Normal pulses.  Pulmonary:     Effort: Pulmonary effort is normal. No respiratory distress.  Abdominal:     General: Abdomen is flat. There is no distension.  Musculoskeletal:     Cervical back: Normal range of motion.     Comments: Examination LUE NVI reproducible triggering of left thumb, TTP over A1 pulley  Skin:    General: Skin is warm and dry.     Findings: No erythema or rash.  Neurological:     General: No focal deficit present.     Mental Status: She is alert and oriented to person, place, and time.  Psychiatric:        Mood and Affect: Mood normal.        Behavior: Behavior normal.      Assessment/Plan Recurrent left trigger thumb  Discussed risks  and benefits of left trigger thumb release on an outpatient basis patient wishes to proceed.    Yvette Rack, MD 02/18/2020, 4:54 PM

## 2020-02-18 NOTE — Progress Notes (Signed)
Denies chest pain or shortness of breath. Cardiology notes in [place]. Reports being in quarantine since COVID test. Educated on visitation policy. Below instructions provided regarding DM management.    How to Manage Your Diabetes Before and After Surgery  How do I manage my blood sugar before surgery? . Check your blood sugar at least 4 times a day, starting 2 days before surgery, to make sure that the level is not too high or low. o Check your blood sugar the morning of your surgery when you wake up and every 2 hours until you get to the Short Stay unit. . If your blood sugar is less than 70 mg/dL, you will need to treat for low blood sugar: o Do not take insulin. o Treat a low blood sugar (less than 70 mg/dL) with  cup of clear juice (cranberry or apple), 4 glucose tablets, OR glucose gel. Recheck blood sugar in 15 minutes after treatment (to make sure it is greater than 70 mg/dL). If your blood sugar is not greater than 70 mg/dL on recheck, call [phone] o  for further instructions. . Report your blood sugar to the short stay nurse when you get to Short Stay.  . If you are admitted to the hospital after surgery: o Your blood sugar will be checked by the staff and you will probably be given insulin after surgery (instead of oral diabetes medicines) to make sure you have good blood sugar levels. o The goal for blood sugar control after surgery is 80-180 mg/dL.    WHAT DO I DO ABOUT MY DIABETES MEDICATION?   Marland Kitchen Do not take oral diabetes medicines (pills) the morning of surgery.  . THE NIGHT BEFORE SURGERY, take _____25______ units of __Levemir_________insulin.       . THE MORNING OF SURGERY, take _____________ units of __________insulin.  . The day of surgery, do not take other diabetes injectables, including Byetta (exenatide), Bydureon (exenatide ER), Victoza (liraglutide), or Trulicity (dulaglutide).

## 2020-02-18 NOTE — H&P (View-Only) (Signed)
Haley George is an 64 y.o. female.   Chief Complaint: left trigger thumb HPI: patient with recurrent triggering of the left thumb.  Has failed 3 injections, did well with the first not as well with the following 2.    Past Medical History:  Diagnosis Date  . Asthma   . Cancer ([place])    basal cell chest  . Complication of anesthesia   . Coronary artery disease   . Diabetes mellitus without complication ([name])    type 2  . Heart murmur   . Hepatitis    auto- immune  . History of kidney stones   . History of nuclear stress test 05/21/2010   dipyridamole; normal perfusion, preserved LV systolic EF of 16%  . Hyperlipidemia   . Hypertension   . Liver failure ([place])    h/o autoimmune hepatitis   . Myocardial infarction ([place])   . Neuropathy    resolved with back surgery; tingling and burning left leg to foot  . Obesity   . PONV (postoperative nausea and vomiting)   . Psoriasis   . Tubular adenoma of colon    colon path 01/09/2017    Past Surgical History:  Procedure Laterality Date  . Carotid Doppler  10/2008   R & L ICAs 0-49% diameter reduction   . COLONOSCOPY    . CORONARY ANGIOPLASTY WITH STENT PLACEMENT  09/2008   2.25x63mm Cypher DES to in-stent restenosis of prox LAD; Mini-Vision stent x2 2.0x50mm to area distal of initial LAD stent; 3 2.5x74mm Taxus stents prox to Cypher stent in circumflex  . CORONARY ANGIOPLASTY WITH STENT PLACEMENT  09/2005   Cypher DES 3.0x63mm to distal RCA; 2.25x56mm Mini0Vision stent to mid LAD; 2.5x13mm Cypher stent to circumflex  . CORONARY STENT INTERVENTION N/A 11/13/2018   Procedure: CORONARY STENT INTERVENTION;  Surgeon: Jettie Booze, MD;  Location: [facility];  Service: Cardiovascular;  Laterality: N/A;  . CORONARY STENT INTERVENTION N/A 12/02/2019   Procedure: CORONARY STENT INTERVENTION;  Surgeon: Nelva Bush, MD;  Location: [facility];  Service: Cardiovascular;  Laterality: N/A;  . LEFT HEART CATH AND CORONARY  ANGIOGRAPHY N/A 11/13/2018   Procedure: LEFT HEART CATH AND CORONARY ANGIOGRAPHY;  Surgeon: Jettie Booze, MD;  Location: [facility];  Service: Cardiovascular;  Laterality: N/A;  . LEFT HEART CATH AND CORONARY ANGIOGRAPHY N/A 12/02/2019   Procedure: LEFT HEART CATH AND CORONARY ANGIOGRAPHY;  Surgeon: Nelva Bush, MD;  Location: [facility];  Service: Cardiovascular;  Laterality: N/A;  . LUMBAR LAMINECTOMY/DECOMPRESSION MICRODISCECTOMY Left 10/18/2016   Procedure: Left Lumbar Four-Five Microdiscectomy;  Surgeon: Erline Levine, MD;  Location: [facility];  Service: Neurosurgery;  Laterality: Left;  Left L4-5 Microdiscectomy  . ROTATOR CUFF REPAIR Left   . SHOULDER ARTHROSCOPY W/ ROTATOR CUFF REPAIR Right   . TRANSTHORACIC ECHOCARDIOGRAM  04/2010   EF=>55%, borderline conc LVH; trace MR & TR; AV mildly sclerotic; aortic root sclerosis/calcif  . ureteral stents      Family History  Problem Relation Age of Onset  . CAD Mother   . CAD Father   . Diabetes Brother    Social History:  reports that she has never smoked. She has never used smokeless tobacco. She reports that she does not drink alcohol and does not use drugs.  Allergies:  Allergies  Allergen Reactions  . Penicillins Anaphylaxis and Hives     PATIENT HAD A PCN REACTION WITH IMMEDIATE RASH, FACIAL/TONGUE/THROAT SWELLING, SOB, OR LIGHTHEADEDNESS WITH HYPOTENSION:  #  #  #  YES  #  #  #   Has patient had a PCN reaction causing severe rash involving mucus membranes or skin necrosis:Unknown Has patient had a PCN reaction that required hospitalization:No Has patient had a PCN reaction occurring within the last 10 years:No If all of the above answers are "NO", then may proceed with Cephalosporin use.   Marland Kitchen Coconut Flavor     Mouth breaks out   . Other Other (See Comments)    Walnuts cause mouth to break out    (Not in a hospital admission)   Results for orders placed or performed during the hospital encounter of  02/17/20 (from the past 48 hour(s))  SARS CORONAVIRUS 2 (TAT 6-24 HRS) Nasopharyngeal Nasopharyngeal Swab     Status: None   Collection Time: 02/17/20  3:36 PM   Specimen: Nasopharyngeal Swab  Result Value Ref Range   SARS Coronavirus 2 NEGATIVE NEGATIVE    Comment: (NOTE) SARS-CoV-2 target nucleic acids are NOT DETECTED.  The SARS-CoV-2 RNA is generally detectable in upper and lower respiratory specimens during the acute phase of infection. Negative results do not preclude SARS-CoV-2 infection, do not rule out co-infections with other pathogens, and should not be used as the sole basis for treatment or other patient management decisions. Negative results must be combined with clinical observations, patient history, and epidemiological information. The expected result is Negative.  Fact Sheet for Patients: SugarRoll.be  Fact Sheet for Healthcare Providers: https://www.woods-mathews.com/  This test is not yet approved or cleared by the [place] FDA and  has been authorized for detection and/or diagnosis of SARS-CoV-2 by FDA under an Emergency Use Authorization (EUA). This EUA will remain  in effect (meaning this test can be used) for the duration of the COVID-19 declaration under Se ction 564(b)(1) of the Act, 21 U.S.C. section 360bbb-3(b)(1), unless the authorization is terminated or revoked sooner.  Performed at [facility], [place] [address] [place], [place] [address]    No results found.  Review of Systems  HENT: Positive for tinnitus.   Respiratory: Positive for shortness of breath.   Musculoskeletal: Positive for joint swelling and myalgias.  All other systems reviewed and are negative.   There were no vitals taken for this visit. Physical Exam Constitutional:      General: She is not in acute distress.    Appearance: Normal appearance.  HENT:     Head: Normocephalic and atraumatic.  Eyes:     Extraocular  Movements: Extraocular movements intact.     Pupils: Pupils are equal, round, and reactive to light.  Cardiovascular:     Rate and Rhythm: Normal rate.     Pulses: Normal pulses.  Pulmonary:     Effort: Pulmonary effort is normal. No respiratory distress.  Abdominal:     General: Abdomen is flat. There is no distension.  Musculoskeletal:     Cervical back: Normal range of motion.     Comments: Examination LUE NVI reproducible triggering of left thumb, TTP over A1 pulley  Skin:    General: Skin is warm and dry.     Findings: No erythema or rash.  Neurological:     General: No focal deficit present.     Mental Status: She is alert and oriented to person, place, and time.  Psychiatric:        Mood and Affect: Mood normal.        Behavior: Behavior normal.      Assessment/Plan Recurrent left trigger thumb  Discussed risks  and benefits of left trigger thumb release on an outpatient basis patient wishes to proceed.    Yvette Rack, MD 02/18/2020, 4:54 PM

## 2020-02-19 ENCOUNTER — Ambulatory Visit (HOSPITAL_COMMUNITY): Payer: BC Managed Care – PPO | Admitting: Physician Assistant

## 2020-02-19 ENCOUNTER — Encounter (HOSPITAL_COMMUNITY)
Admission: RE | Disposition: A | Discharge status: Home / Self Care | Payer: Self-pay | Source: Home / Self Care | Attending: Orthopedic Surgery

## 2020-02-19 ENCOUNTER — Encounter (HOSPITAL_COMMUNITY): Payer: Self-pay | Admitting: Orthopedic Surgery

## 2020-02-19 ENCOUNTER — Other Ambulatory Visit: Payer: Self-pay

## 2020-02-19 ENCOUNTER — Ambulatory Visit (HOSPITAL_COMMUNITY)
Admission: RE | Admit: 2020-02-19 | Discharge: 2020-02-19 | Disposition: A | Discharge status: Home / Self Care | Payer: BC Managed Care – PPO | Attending: Orthopedic Surgery | Admitting: Orthopedic Surgery

## 2020-02-19 DIAGNOSIS — Z85828 Personal history of other malignant neoplasm of skin: Secondary | ICD-10-CM | POA: Diagnosis not present

## 2020-02-19 DIAGNOSIS — Z7902 Long term (current) use of antithrombotics/antiplatelets: Secondary | ICD-10-CM | POA: Diagnosis not present

## 2020-02-19 DIAGNOSIS — Z955 Presence of coronary angioplasty implant and graft: Secondary | ICD-10-CM | POA: Insufficient documentation

## 2020-02-19 DIAGNOSIS — T82855A Stenosis of coronary artery stent, initial encounter: Secondary | ICD-10-CM | POA: Diagnosis not present

## 2020-02-19 DIAGNOSIS — I252 Old myocardial infarction: Secondary | ICD-10-CM | POA: Insufficient documentation

## 2020-02-19 DIAGNOSIS — I2510 Atherosclerotic heart disease of native coronary artery without angina pectoris: Secondary | ICD-10-CM | POA: Insufficient documentation

## 2020-02-19 DIAGNOSIS — Y838 Other surgical procedures as the cause of abnormal reaction of the patient, or of later complication, without mention of misadventure at the time of the procedure: Secondary | ICD-10-CM | POA: Diagnosis not present

## 2020-02-19 DIAGNOSIS — Z79899 Other long term (current) drug therapy: Secondary | ICD-10-CM | POA: Insufficient documentation

## 2020-02-19 DIAGNOSIS — M65312 Trigger thumb, left thumb: Secondary | ICD-10-CM | POA: Insufficient documentation

## 2020-02-19 HISTORY — PX: TRIGGER FINGER RELEASE: SHX641

## 2020-02-19 LAB — GLUCOSE, CAPILLARY
Glucose-Capillary: 103 mg/dL — ABNORMAL HIGH (ref 70–99)
Glucose-Capillary: 104 mg/dL — ABNORMAL HIGH (ref 70–99)
Glucose-Capillary: 98 mg/dL (ref 70–99)

## 2020-02-19 LAB — COMPREHENSIVE METABOLIC PANEL
ALT: 24 U/L (ref 0–44)
AST: 38 U/L (ref 15–41)
Albumin: 4.1 g/dL (ref 3.5–5.0)
Alkaline Phosphatase: 33 U/L — ABNORMAL LOW (ref 38–126)
Anion gap: 13 (ref 5–15)
BUN: 18 mg/dL (ref 8–23)
CO2: 20 mmol/L — ABNORMAL LOW (ref 22–32)
Calcium: 9.4 mg/dL (ref 8.9–10.3)
Chloride: 106 mmol/L (ref 98–111)
Creatinine, Ser: 1.13 mg/dL — ABNORMAL HIGH (ref 0.44–1.00)
GFR, Estimated: 55 mL/min — ABNORMAL LOW (ref >60)
Glucose, Bld: 102 mg/dL — ABNORMAL HIGH (ref 70–99)
Potassium: 4.3 mmol/L (ref 3.5–5.1)
Sodium: 139 mmol/L (ref 135–145)
Total Bilirubin: 0.7 mg/dL (ref 0.3–1.2)
Total Protein: 7.7 g/dL (ref 6.5–8.1)

## 2020-02-19 LAB — CBC
HCT: 39.4 % (ref 36.0–46.0)
Hemoglobin: 12 g/dL (ref 12.0–15.0)
MCH: 28 pg (ref 26.0–34.0)
MCHC: 30.5 g/dL (ref 30.0–36.0)
MCV: 92.1 fL (ref 80.0–100.0)
Platelets: 268 10*3/uL (ref 150–400)
RBC: 4.28 MIL/uL (ref 3.87–5.11)
RDW: 12.5 % (ref 11.5–15.5)
WBC: 4.3 10*3/uL (ref 4.0–10.5)
nRBC: 0 % (ref 0.0–0.2)

## 2020-02-19 SURGERY — RELEASE, A1 PULLEY, FOR TRIGGER FINGER
Anesthesia: Regional | Site: Hand | Laterality: Left

## 2020-02-19 MED ORDER — PROPOFOL 1000 MG/100ML IV EMUL
INTRAVENOUS | Status: AC
  Filled 2020-02-19: qty 100

## 2020-02-19 MED ORDER — HYDROCODONE-ACETAMINOPHEN 5-325 MG PO TABS: 1.0000 | ORAL_TABLET | Freq: Four times a day (QID) | ORAL | 0 refills | Status: DC | PRN

## 2020-02-19 MED ORDER — FENTANYL CITRATE (PF) 100 MCG/2ML IJ SOLN: 50.0000 ug | Freq: Once | INTRAMUSCULAR | Status: AC

## 2020-02-19 MED ORDER — MIDAZOLAM HCL 2 MG/2ML IJ SOLN
INTRAMUSCULAR | Status: AC
  Administered 2020-02-19: 2 mg via INTRAVENOUS
  Filled 2020-02-19: qty 2

## 2020-02-19 MED ORDER — LIDOCAINE-EPINEPHRINE (PF) 1.5 %-1:200000 IJ SOLN
INTRAMUSCULAR | Status: DC | PRN
  Administered 2020-02-19: 10 mL via PERINEURAL

## 2020-02-19 MED ORDER — CHLORHEXIDINE GLUCONATE 0.12 % MT SOLN
15.0000 mL | Freq: Once | OROMUCOSAL | Status: AC
  Administered 2020-02-19: 15 mL via OROMUCOSAL
  Filled 2020-02-19: qty 15

## 2020-02-19 MED ORDER — BUPIVACAINE HCL (PF) 0.25 % IJ SOLN
INTRAMUSCULAR | Status: AC
  Filled 2020-02-19: qty 30

## 2020-02-19 MED ORDER — BUPIVACAINE-EPINEPHRINE (PF) 0.25% -1:200000 IJ SOLN
INTRAMUSCULAR | Status: AC
  Filled 2020-02-19: qty 10

## 2020-02-19 MED ORDER — LACTATED RINGERS IV SOLN: INTRAVENOUS | Status: DC

## 2020-02-19 MED ORDER — PROPOFOL 10 MG/ML IV BOLUS
INTRAVENOUS | Status: DC | PRN
  Administered 2020-02-19 (×2): 30 mg via INTRAVENOUS

## 2020-02-19 MED ORDER — CLINDAMYCIN PHOSPHATE 900 MG/50ML IV SOLN
900.0000 mg | INTRAVENOUS | Status: AC
  Administered 2020-02-19: 900 mg via INTRAVENOUS
  Filled 2020-02-19: qty 50

## 2020-02-19 MED ORDER — LIDOCAINE 2% (20 MG/ML) 5 ML SYRINGE
INTRAMUSCULAR | Status: AC
  Filled 2020-02-19: qty 5

## 2020-02-19 MED ORDER — ONDANSETRON HCL 4 MG/2ML IJ SOLN
INTRAMUSCULAR | Status: AC
  Filled 2020-02-19: qty 4

## 2020-02-19 MED ORDER — LIDOCAINE HCL 1 % IJ SOLN
INTRAMUSCULAR | Status: AC
  Filled 2020-02-19: qty 20

## 2020-02-19 MED ORDER — DEXAMETHASONE SODIUM PHOSPHATE 10 MG/ML IJ SOLN
INTRAMUSCULAR | Status: AC
  Filled 2020-02-19: qty 2

## 2020-02-19 MED ORDER — BUPIVACAINE-EPINEPHRINE (PF) 0.25% -1:200000 IJ SOLN
INTRAMUSCULAR | Status: DC | PRN
  Administered 2020-02-19: 7 mL via PERINEURAL

## 2020-02-19 MED ORDER — ORAL CARE MOUTH RINSE: 15.0000 mL | Freq: Once | OROMUCOSAL | Status: AC

## 2020-02-19 MED ORDER — ONDANSETRON HCL 4 MG/2ML IJ SOLN
INTRAMUSCULAR | Status: AC
  Filled 2020-02-19: qty 2

## 2020-02-19 MED ORDER — MIDAZOLAM HCL 2 MG/2ML IJ SOLN: 2.0000 mg | Freq: Once | INTRAMUSCULAR | Status: AC

## 2020-02-19 MED ORDER — ONDANSETRON HCL 4 MG/2ML IJ SOLN
INTRAMUSCULAR | Status: DC | PRN
  Administered 2020-02-19: 4 mg via INTRAVENOUS

## 2020-02-19 MED ORDER — FENTANYL CITRATE (PF) 100 MCG/2ML IJ SOLN
INTRAMUSCULAR | Status: AC
  Administered 2020-02-19: 50 ug via INTRAVENOUS
  Filled 2020-02-19: qty 2

## 2020-02-19 MED ORDER — BUPIVACAINE HCL (PF) 0.5 % IJ SOLN
INTRAMUSCULAR | Status: DC | PRN
  Administered 2020-02-19: 10 mL via PERINEURAL

## 2020-02-19 MED ORDER — PROPOFOL 500 MG/50ML IV EMUL
INTRAVENOUS | Status: DC | PRN
  Administered 2020-02-19: 125 ug/kg/min via INTRAVENOUS

## 2020-02-19 MED ORDER — 0.9 % SODIUM CHLORIDE (POUR BTL) OPTIME
TOPICAL | Status: DC | PRN
  Administered 2020-02-19: 1000 mL

## 2020-02-19 SURGICAL SUPPLY — 35 items
BNDG CMPR 9X4 STRL LF SNTH (GAUZE/BANDAGES/DRESSINGS) ×1
BNDG COHESIVE 2X5 TAN STRL LF (GAUZE/BANDAGES/DRESSINGS) ×2 IMPLANT
BNDG CONFORM 3 STRL LF (GAUZE/BANDAGES/DRESSINGS) ×3 IMPLANT
BNDG ELASTIC 3X5.8 VLCR STR LF (GAUZE/BANDAGES/DRESSINGS) ×3 IMPLANT
BNDG ESMARK 4X9 LF (GAUZE/BANDAGES/DRESSINGS) ×3 IMPLANT
CORD BIPOLAR FORCEPS 12FT (ELECTRODE) ×3 IMPLANT
COVER SURGICAL LIGHT HANDLE (MISCELLANEOUS) ×3 IMPLANT
COVER WAND RF STERILE (DRAPES) ×3 IMPLANT
CUFF TOURN SGL QUICK 18X4 (TOURNIQUET CUFF) ×3 IMPLANT
CUFF TOURN SGL QUICK 24 (TOURNIQUET CUFF)
CUFF TRNQT CYL 24X4X16.5-23 (TOURNIQUET CUFF) IMPLANT
DRSG EMULSION OIL 3X3 NADH (GAUZE/BANDAGES/DRESSINGS) ×3 IMPLANT
DURAPREP 26ML APPLICATOR (WOUND CARE) ×3 IMPLANT
GAUZE SPONGE 4X4 12PLY STRL (GAUZE/BANDAGES/DRESSINGS) ×3 IMPLANT
GLOVE BIOGEL PI IND STRL 8 (GLOVE) ×2 IMPLANT
GLOVE BIOGEL PI INDICATOR 8 (GLOVE) ×4
GLOVE ORTHO TXT STRL SZ7.5 (GLOVE) ×3 IMPLANT
GLOVE SURG ORTHO 8.0 STRL STRW (GLOVE) ×3 IMPLANT
GOWN STRL REUS W/ TWL LRG LVL3 (GOWN DISPOSABLE) ×1 IMPLANT
GOWN STRL REUS W/ TWL XL LVL3 (GOWN DISPOSABLE) ×2 IMPLANT
GOWN STRL REUS W/TWL LRG LVL3 (GOWN DISPOSABLE) ×3
GOWN STRL REUS W/TWL XL LVL3 (GOWN DISPOSABLE) ×6
KIT BASIN OR (CUSTOM PROCEDURE TRAY) ×3 IMPLANT
KIT TURNOVER KIT B (KITS) ×3 IMPLANT
NDL HYPO 25X1 1.5 SAFETY (NEEDLE) IMPLANT
NDL PRECISIONGLIDE 27X1.5 (NEEDLE) IMPLANT
NEEDLE HYPO 25X1 1.5 SAFETY (NEEDLE) IMPLANT
NEEDLE PRECISIONGLIDE 27X1.5 (NEEDLE) ×3 IMPLANT
NS IRRIG 1000ML POUR BTL (IV SOLUTION) ×3 IMPLANT
PACK ORTHO EXTREMITY (CUSTOM PROCEDURE TRAY) ×3 IMPLANT
PAD ARMBOARD 7.5X6 YLW CONV (MISCELLANEOUS) ×3 IMPLANT
SUT ETHILON 4 0 PS 2 18 (SUTURE) ×3 IMPLANT
SYR CONTROL 10ML LL (SYRINGE) ×2 IMPLANT
TOWEL GREEN STERILE FF (TOWEL DISPOSABLE) ×3 IMPLANT
UNDERPAD 30X36 HEAVY ABSORB (UNDERPADS AND DIAPERS) ×3 IMPLANT

## 2020-02-19 NOTE — Interval H&P Note (Signed)
History and Physical Interval Note:  02/19/2020 11:16 AM  Haley George  has presented today for surgery, with the diagnosis of Trigger Finger Pain.  The various methods of treatment have been discussed with the patient and family. After consideration of risks, benefits and other options for treatment, the patient has consented to  Procedure(s): RELEASE TRIGGER FINGER/A-1 PULLEY (Left) as a surgical intervention.  The patient's history has been reviewed, patient examined, no change in status, stable for surgery.  I have reviewed the patient's chart and labs.  Questions were answered to the patient's satisfaction.     Yvette Rack

## 2020-02-19 NOTE — Op Note (Signed)
NAME: Haley George, DOUBLEDAY MEDICAL RECORD [id number] ACCOUNT [id number] DATE OF BIRTH:Oct 22, 1955 FACILITY: [facility] LOCATION: [facility] PHYSICIAN:W. Kaitlynd Phillips JR., MD  OPERATIVE REPORT  DATE OF PROCEDURE:  02/19/2020  PREOPERATIVE DIAGNOSIS:  Recurrent left trigger thumb.  POSTOPERATIVE DIAGNOSIS:  Recurrent left trigger thumb.  OPERATION:  Left trigger thumb release.  SURGEON:  Vangie Bicker, MD  ASSISTANT:  Jennet Maduro, PA.  SEDATION:  Block anesthesia.  TOURNIQUET TIME:  Approximately 15 minutes.  DESCRIPTION OF PROCEDURE:  After block and sedation, a transverse incision was made over the MCP crease of the thumb, identified the proper digital nerve on the ulnar aspect of the thumb.  We were on the proximal portion of the annular pulley of the  thumb.  We carefully identified this with an 11 blade split it, releasing the trigger thumb.  There was significant synovial reaction around this with fluid within the sheath.  Complete release of the pulley was done.  Wound was irrigated.  We did  infiltrate the skin carefully with Marcaine with closure with 4-0 nylon.  A very bulky compressive dressing was applied.  She was taken to recovery room in stable condition.  VN/NUANCE  D:02/19/2020 T:02/19/2020 JOB:[id number]

## 2020-02-19 NOTE — Brief Op Note (Signed)
02/19/2020  12:10 PM  PATIENT:  Haley George  64 y.o. female  PRE-OPERATIVE DIAGNOSIS:  Trigger Finger Pain  POST-OPERATIVE DIAGNOSIS:  Trigger Finger Pain  PROCEDURE:  Procedure(s): RELEASE TRIGGER FINGER/A-1 PULLEY (Left)  SURGEON:  Surgeon(s) and Role:    Earlie Server, MD - Primary  PHYSICIAN ASSISTANT: Chriss Czar, PA-C  ASSISTANTS:    ANESTHESIA:   local, regional and IV sedation  EBL:  minimal   BLOOD ADMINISTERED:none  DRAINS: none   LOCAL MEDICATIONS USED:  MARCAINE     SPECIMEN:  No Specimen  DISPOSITION OF SPECIMEN:  N/A  COUNTS:  YES  TOURNIQUET:   Total Tourniquet Time Documented: area (laterality) - 18 minutes Total: area (laterality) - 18 minutes   DICTATION: .Other Dictation: Dictation Number unknown  PLAN OF CARE: Discharge to home after PACU  PATIENT DISPOSITION:  PACU - hemodynamically stable.   Delay start of Pharmacological VTE agent (>24hrs) due to surgical blood loss or risk of bleeding: not applicable

## 2020-02-19 NOTE — Discharge Instructions (Signed)
Diet: As you were doing prior to hospitalization   Activity: Increase activity slowly as tolerated  No lifting or driving for 24 hours  Shower: May shower keeping dressing covered and dry.  Dressing: leave dressing in place until first follow up on Monday.  To prevent constipation: you may use a stool softener such as -  Colace ( over the counter) 100 mg by mouth twice a day  Drink plenty of fluids ( prune juice may be helpful) and high fiber foods  Miralax ( over the counter) for constipation as needed.   Precautions: If you experience chest pain or shortness of breath - call 911 immediately For transfer to the hospital emergency department!!  If you develop a fever greater that 101 F, purulent drainage from wound, increased redness or drainage from wound, or calf pain -- Call the office   Follow- Up Appointment: Please call for an appointment to be seen on Monday of next week. [place] - [phone]

## 2020-02-19 NOTE — Anesthesia Procedure Notes (Addendum)
Anesthesia Regional Block: Supraclavicular block   Pre-Anesthetic Checklist: ,, timeout performed, Correct Patient, Correct Site, Correct Laterality, Correct Procedure, Correct Position, site marked, Risks and benefits discussed,  Surgical consent,  Pre-op evaluation,  At surgeon's request and post-op pain management  Laterality: Left  Prep: chloraprep       Needles:  Injection technique: Single-shot  Needle Type: Echogenic Needle     Needle Length: 5cm  Needle Gauge: 21     Additional Needles:   Procedures:,,,, ultrasound used (permanent image in chart),,,,  Narrative:  Start time: 02/19/2020 10:37 AM End time: 02/19/2020 10:47 AM Injection made incrementally with aspirations every 5 mL.  Performed by: Personally  Anesthesiologist: Barnet Glasgow, MD

## 2020-02-19 NOTE — Anesthesia Postprocedure Evaluation (Signed)
Anesthesia Post Note  Patient: Haley George  Procedure(s) Performed: RELEASE TRIGGER FINGER/A-1 PULLEY (Left Hand)     Patient location during evaluation: PACU Anesthesia Type: Regional Level of consciousness: awake and alert Pain management: pain level controlled Vital Signs Assessment: post-procedure vital signs reviewed and stable Respiratory status: spontaneous breathing, nonlabored ventilation, respiratory function stable and patient connected to nasal cannula oxygen Cardiovascular status: stable and blood pressure returned to baseline Postop Assessment: no apparent nausea or vomiting Anesthetic complications: no   No complications documented.  Last Vitals:  Vitals:   02/19/20 1045 02/19/20 1212  BP: (!) 167/48 (!) 143/84  Pulse: 89 84  Resp: 14 14  Temp:  36.6 C  SpO2: 100% 100%    Last Pain:  Vitals:   02/19/20 1212  TempSrc:   PainSc: 0-No pain                 Barnet Glasgow

## 2020-02-19 NOTE — Transfer of Care (Signed)
Immediate Anesthesia Transfer of Care Note  Patient: Haley George  Procedure(s) Performed: RELEASE TRIGGER FINGER/A-1 PULLEY (Left Hand)  Patient Location: PACU  Anesthesia Type:MAC combined with regional for post-op pain  Level of Consciousness: awake, alert  and oriented  Airway & Oxygen Therapy: Patient Spontanous Breathing and Patient connected to face mask oxygen  Post-op Assessment: Report given to RN and Post -op Vital signs reviewed and stable  Post vital signs: Reviewed and stable  Last Vitals:  Vitals Value Taken Time  BP 143/84 02/19/20 1212  Temp    Pulse 81 02/19/20 1213  Resp 17 02/19/20 1213  SpO2 100 % 02/19/20 1213  Vitals shown include unvalidated device data.  Last Pain:  Vitals:   02/19/20 0838  TempSrc: Oral         Complications: No complications documented.

## 2020-02-20 ENCOUNTER — Encounter (HOSPITAL_COMMUNITY): Payer: Self-pay | Admitting: Orthopedic Surgery

## 2020-02-26 ENCOUNTER — Other Ambulatory Visit: Payer: Self-pay | Admitting: Family Medicine

## 2020-02-26 ENCOUNTER — Ambulatory Visit: Payer: BC Managed Care – PPO | Admitting: Internal Medicine

## 2020-02-26 DIAGNOSIS — E118 Type 2 diabetes mellitus with unspecified complications: Secondary | ICD-10-CM

## 2020-02-28 ENCOUNTER — Other Ambulatory Visit: Payer: Self-pay | Admitting: Family Medicine

## 2020-02-28 DIAGNOSIS — E1169 Type 2 diabetes mellitus with other specified complication: Secondary | ICD-10-CM

## 2020-02-28 DIAGNOSIS — E785 Hyperlipidemia, unspecified: Secondary | ICD-10-CM

## 2020-03-06 ENCOUNTER — Other Ambulatory Visit: Payer: Self-pay

## 2020-03-06 ENCOUNTER — Encounter: Payer: Self-pay | Admitting: Medical

## 2020-03-06 ENCOUNTER — Ambulatory Visit: Payer: BC Managed Care – PPO | Admitting: Medical

## 2020-03-06 VITALS — BP 130/72 | HR 90 | Ht 64.0 in | Wt 172.8 lb

## 2020-03-06 DIAGNOSIS — R300 Dysuria: Secondary | ICD-10-CM

## 2020-03-06 DIAGNOSIS — R350 Frequency of micturition: Secondary | ICD-10-CM

## 2020-03-06 LAB — POCT URINALYSIS DIP (PROADVANTAGE DEVICE)
Bilirubin, UA: NEGATIVE
Blood, UA: NEGATIVE
Glucose, UA: NEGATIVE mg/dL
Ketones, POC UA: NEGATIVE mg/dL
Leukocytes, UA: NEGATIVE
Nitrite, UA: NEGATIVE
Protein Ur, POC: NEGATIVE mg/dL
Specific Gravity, Urine: 1.03
Urobilinogen, Ur: 0.2
pH, UA: 5 (ref 5.0–8.0)

## 2020-03-06 LAB — POCT WET PREP (WET MOUNT)

## 2020-03-06 MED ORDER — SULFAMETHOXAZOLE-TRIMETHOPRIM 800-160 MG PO TABS: 1.0000 | ORAL_TABLET | Freq: Two times a day (BID) | ORAL | 0 refills | Status: DC

## 2020-03-06 MED ORDER — FLUCONAZOLE 150 MG PO TABS: 150.0000 mg | ORAL_TABLET | Freq: Every day | ORAL | 0 refills | Status: DC

## 2020-03-06 NOTE — Progress Notes (Signed)
Subjective:   Haley George is a 64 y.o. female who complains of possible urinary tract infection.  They report symptoms for 1 day.  Symptoms include not feeling well, burning with urination, urine frequency.   No blood in urine.  Urine has been a little cloudy.   No urine odor. Trying to hydrate well.  Patient denies fever, nausea, vomiting, body aches or chills.  Last UTI was 3-4 mo ago.   no other aggravating or relieving factors.   Husband passed away 10 weeks ago.  Passed away of some type of cardiac event.  No other c/o.  Past Medical History:  Diagnosis Date  . Asthma   . Cancer ([place])    basal cell chest  . Complication of anesthesia   . Coronary artery disease   . Diabetes mellitus without complication ([place])    type 2  . Heart murmur   . Hepatitis    auto- immune  . History of kidney stones   . History of nuclear stress test 05/21/2010   dipyridamole; normal perfusion, preserved LV systolic EF of 24%  . Hyperlipidemia   . Hypertension   . Liver failure ([place])    h/o autoimmune hepatitis   . Myocardial infarction ([place])   . Neuropathy    resolved with back surgery; tingling and burning left leg to foot  . Obesity   . PONV (postoperative nausea and vomiting)   . Psoriasis   . Tubular adenoma of colon    colon path 01/09/2017    Current Outpatient Medications on File Prior to Visit  Medication Sig Dispense Refill  . acetaminophen (TYLENOL) 325 MG tablet Take 325 mg by mouth every 6 (six) hours as needed for moderate pain or headache.    . Alirocumab (PRALUENT) 150 MG/ML SOAJ Inject 150 mg into the skin every 14 (fourteen) days. 6 pen 1  . Ascorbic Acid (VITAMIN C) 500 MG CAPS Take 500 mg by mouth daily.    Marland Kitchen aspirin EC 81 MG tablet Take 1 tablet (81 mg total) by mouth daily.    . Blood Glucose Monitoring Suppl (ONETOUCH VERIO) w/Device KIT Use to check blood sugar 2 times a day 1 kit 0  . BREO ELLIPTA 200-25 MCG/INH AEPB Inhale 1 puff into the lungs at bedtime.     .  clopidogrel (PLAVIX) 75 MG tablet Take 75 mg by mouth daily.    . Coenzyme Q10 300 MG CAPS Take 300 mg by mouth daily.    . Cranberry (SM CRANBERRY) 300 MG tablet Take 300 mg by mouth every evening.    . fenofibrate (TRICOR) 145 MG tablet TAKE 1 TABLET (145 MG TOTAL) BY MOUTH EVERY EVENING. 90 tablet 1  . glipiZIDE (GLUCOTROL) 5 MG tablet TAKE 0.5-1 TABLETS (2.5-5 MG TOTAL) BY MOUTH DAILY BEFORE SUPPER. (Patient taking differently: Take 5 mg by mouth See admin instructions. Take 5 mg 30 minutes prior to a large meal as needed for high blood sugar) 90 tablet 2  . Glucosamine-Chondroit-Vit C-Mn (GLUCOSAMINE CHONDR 1500 COMPLX PO) Take 1 tablet by mouth daily.    Marland Kitchen glucose blood (ONETOUCH VERIO) test strip Use to check blood sugar 2 times a day 200 each 12  . HYDROcodone-acetaminophen (NORCO) 5-325 MG tablet Take 1 tablet by mouth every 6 (six) hours as needed for moderate pain. 20 tablet 0  . Insulin Pen Needle (BD PEN NEEDLE NANO U/F) 32G X 4 MM MISC Use as directed for insulin administration. 100 each 3  . isosorbide mononitrate (  IMDUR) 30 MG 24 hr tablet TAKE 1 TABLET (30 MG TOTAL) BY MOUTH DAILY. ** DO NOT CRUSH ** 90 tablet 3  . ketoconazole (NIZORAL) 2 % cream Apply 1 application topically daily as needed for irritation.    Marland Kitchen LEVEMIR FLEXTOUCH 100 UNIT/ML FlexPen INJECT 50 UNITS INTO THE SKIN EVERY EVENING. 15 mL 3  . Melatonin 3 MG CAPS Take 1.5 mg by mouth at bedtime as needed (sleep).    . metFORMIN (GLUCOPHAGE) 1000 MG tablet TAKE 1 TABLET (1,000 MG TOTAL) BY MOUTH 2 (TWO) TIMES DAILY WITH A MEAL. 180 tablet 1  . metoprolol succinate (TOPROL-XL) 25 MG 24 hr tablet Take 1.5 tablets (37.5 mg total) by mouth daily. (Patient taking differently: Take 25 mg by mouth daily. ) 135 tablet 3  . Multiple Vitamins-Minerals (CENTRUM SILVER 50+WOMEN PO) Take 1 tablet by mouth daily.    . nitroGLYCERIN (NITROSTAT) 0.4 MG SL tablet Place 1 tablet (0.4 mg total) under the tongue every 5 (five) minutes as  needed for chest pain (up to 3 doses). 25 tablet 3  . ReliOn Lancets Thin 26G MISC 1 Units by Does not apply route 2 (two) times daily. E 11.9 100 each 12  . rosuvastatin (CRESTOR) 20 MG tablet TAKE 1 TABLET BY MOUTH EVERY DAY (Patient taking differently: Take 20 mg by mouth daily. ) 90 tablet 3  . Semaglutide,0.25 or 0.5MG/DOS, (OZEMPIC, 0.25 OR 0.5 MG/DOSE,) 2 MG/1.5ML SOPN Inject 0.5 mg into the skin every Monday. 6 pen 3  . sharps container For disposing of insulin pen needles 1 each 2  . valsartan (DIOVAN) 160 MG tablet TAKE 1 TABLET BY MOUTH EVERY DAY (Patient taking differently: Take 160 mg by mouth daily. ) 90 tablet 3  . VASCEPA 1 g capsule TAKE 2 CAPSULES (2 G TOTAL) BY MOUTH 2 (TWO) TIMES DAILY. 360 capsule 3   No current facility-administered medications on file prior to visit.    ROS as in subjective  Reviewed allergies, medications, past medical, surgical, and social history.     Objective: BP 130/72   Pulse 90   Ht _0  (1.626 m)   Wt 172 lb 12.8 oz (78.4 kg)   SpO2 97%   BMI 29.66 kg/m   General appearance: alert, no distress, WD/WN, female Abdomen: +bs, soft, non tender, non distended, no masses, no hepatomegaly, no splenomegaly, no bruits GU: deferred      Assessment: Encounter Diagnoses  Name Primary?  . Dysuria Yes  . Urine frequency      Plan: Discussed symptoms,possible etiology.   Currently urinalysis unremarkable.  Vaginal wet prep self swab with few yeast, otherwise normal.  Continue to hydrate well.   Discussed symptoms of UTI vs yeast vaginitis.   No concern for STD.  Sent both bactrim and diflucan to pharmacy in the event of either UTI or yeast symptoms respectively over the weekend.   We will call with culture results.  If new or worse symptoms over the weekend, get re-evaluated.   Haley George was seen today for urinary tract infection.  Diagnoses and all orders for this visit:  Dysuria -     POCT Urinalysis DIP (Proadvantage Device) -     Urine  Culture -     POCT Wet Prep [facility])  Urine frequency -     POCT Urinalysis DIP (Proadvantage Device) -     Urine Culture -     POCT Wet Prep [facility])  Other orders -     sulfamethoxazole-trimethoprim (  BACTRIM DS) 800-160 MG tablet; Take 1 tablet by mouth 2 (two) times daily. -     fluconazole (DIFLUCAN) 150 MG tablet; Take 1 tablet (150 mg total) by mouth daily. Take 1 tablet today. Take 1 tablet in 1 week.   Call or return if worse or not improving in the next 3-4 days.

## 2020-03-11 ENCOUNTER — Other Ambulatory Visit: Payer: Self-pay | Admitting: Medical

## 2020-03-11 LAB — URINE CULTURE

## 2020-03-11 LAB — HM MAMMOGRAPHY

## 2020-03-11 MED ORDER — CIPROFLOXACIN HCL 500 MG PO TABS: 500.0000 mg | ORAL_TABLET | Freq: Two times a day (BID) | ORAL | 0 refills | Status: AC

## 2020-03-16 ENCOUNTER — Ambulatory Visit: Payer: BC Managed Care – PPO | Admitting: Internal Medicine

## 2020-03-17 ENCOUNTER — Other Ambulatory Visit: Payer: Self-pay

## 2020-03-17 ENCOUNTER — Ambulatory Visit (INDEPENDENT_AMBULATORY_CARE_PROVIDER_SITE_OTHER): Payer: BC Managed Care – PPO | Admitting: Internal Medicine

## 2020-03-17 ENCOUNTER — Encounter: Payer: Self-pay | Admitting: Internal Medicine

## 2020-03-17 VITALS — BP 134/74 | HR 96 | Ht 64.0 in | Wt 172.4 lb

## 2020-03-17 DIAGNOSIS — E1169 Type 2 diabetes mellitus with other specified complication: Secondary | ICD-10-CM

## 2020-03-17 DIAGNOSIS — E6609 Other obesity due to excess calories: Secondary | ICD-10-CM

## 2020-03-17 DIAGNOSIS — E785 Hyperlipidemia, unspecified: Secondary | ICD-10-CM

## 2020-03-17 DIAGNOSIS — E118 Type 2 diabetes mellitus with unspecified complications: Secondary | ICD-10-CM

## 2020-03-17 DIAGNOSIS — Z6834 Body mass index (BMI) 34.0-34.9, adult: Secondary | ICD-10-CM

## 2020-03-17 DIAGNOSIS — E1159 Type 2 diabetes mellitus with other circulatory complications: Secondary | ICD-10-CM | POA: Diagnosis not present

## 2020-03-17 DIAGNOSIS — Z794 Long term (current) use of insulin: Secondary | ICD-10-CM

## 2020-03-17 MED ORDER — GLIPIZIDE 5 MG PO TABS
2.5000 mg | ORAL_TABLET | Freq: Every day | ORAL | 3 refills | Status: DC
Start: 2020-03-17 — End: 2022-04-28

## 2020-03-17 MED ORDER — LEVEMIR FLEXTOUCH 100 UNIT/ML ~~LOC~~ SOPN: 45.0000 [IU] | PEN_INJECTOR | Freq: Every day | SUBCUTANEOUS | 3 refills | Status: DC

## 2020-03-17 MED ORDER — METFORMIN HCL 1000 MG PO TABS: 1000.0000 mg | ORAL_TABLET | Freq: Two times a day (BID) | ORAL | 3 refills | Status: DC

## 2020-03-17 MED ORDER — OZEMPIC (0.25 OR 0.5 MG/DOSE) 2 MG/1.5ML ~~LOC~~ SOPN: 0.5000 mg | PEN_INJECTOR | SUBCUTANEOUS | 3 refills | Status: DC

## 2020-03-17 NOTE — Patient Instructions (Signed)
Please continue: - Metformin 1000 mg 2x a day with meals - Glipizide 2.5 to 5 mg before a large meal - Ozempic 0.5 mg weekly  Please decrease: - Levemir to 45 units at bedtime  Please come back for a follow-up appointment in 4 months.

## 2020-03-17 NOTE — Progress Notes (Signed)
Patient ID: Haley George, female   DOB: June 29, 1955, 64 y.o.   MRN: [id number]   This visit occurred during the SARS-CoV-2 public health emergency.  Safety protocols were in place, including screening questions prior to the visit, additional usage of staff PPE, and extensive cleaning of exam room while observing appropriate contact time as indicated for disinfecting solutions.   HPI: CAELYN George is a 64 y.o.-year-old female, initially referred by her PCP, Dr. Redmond School, returning for follow-up for DM2, dx initially in 1997 2/2 liver failure, then GDM, then re-occuring in 2007, insulin-dependent, uncontrolled, with long-term complications (CAD-s/p AMI, s/p stents, aortic valve stenosis; CKD).  Last visit 4 mo ago.  She was admitted for an NSTEMI on 12/01/2019. At the end of 12/26/19, her husband died very unexpectedly. She is very shaken up by this experience. She did not eat very well since last visit and occasionally forgot her diabetic medications.  Reviewed HbA1c levels: Lab Results  Component Value Date   HGBA1C 6.2 (A) 01/28/2020   HGBA1C 6.6 (H) 12/02/2019   HGBA1C 6.4 (H) 10/22/2019   HGBA1C 6.3 (A) 01/29/2019   HGBA1C 9.2 (A) 08/30/2018   HGBA1C 8.8 (A) 03/27/2018   HGBA1C 9.2 (A) 12/19/2017   HGBA1C 7.3 03/13/2017   HGBA1C 10.5 (H) 10/18/2016   HGBA1C 7.1 07/12/2016  05/01/2019: HbA1c 6.2% She was on steroid injections in 2019.  Pt is on a regimen of: - Metformin 1000 mg 2x a day, with meals - Glipizide 2.5-5 mg before a meal - added 01/2019 -may forget it - Levemir 140 (70 units x2) units at bedtime >> 70 units >> 50 >> 50 units at bedtime   - Ozempic 0.5 mg weekly-added 09/2018 She was on Actos before.  Pt checks her sugars 2+ today: - am: 128-200 >> 93-110 >> 91-126 >> 79, 81-101 >> 83-96, 132, 137 if forgets insulin - 2h after b'fast: n/c >> 129-148 >> n/c - before lunch: n/c - 2h after lunch: n/c >> 180-200  >> 138-148 >> 102, 149, 183 (no glipizide) - before dinner:  n/c - 2h after dinner:  180-220 >> 132-161, 168 >> 149, 170, 182 (no glipizide) - bedtime: n/c >> 99-137, 152 - nighttime: n/c Lowest sugar was 40 - 2012 >> ...  79 >> 89; she has hypoglycemia awareness in the 70s. Highest sugar was 368 >> 220 >> 220 >> 203 >> 183.  Glucometer: ReliOn  Pt's meals are: - Breakfast: if not skipping - egg + whole wheat toast; cheerios + milk - Lunch: chicken salad, apple, veggies, trisquits  - Dinner: meat + 2 veggies +/- rolls  Before last visit, she was on [place] for autoimmune hepatitis.  She could not tolerate it and had to stop.  She lost 24 pounds on it.  -+ CKD, last BUN/creatinine:  Lab Results  Component Value Date   BUN 18 02/19/2020   BUN 21 12/03/2019   CREATININE 1.13 (H) 02/19/2020   CREATININE 1.43 (H) 12/03/2019  On Diovan 160.  -+ HL; last set of lipids: Lab Results  Component Value Date   CHOL 134 12/02/2019   HDL 20 (L) 12/02/2019   LDLCALC UNABLE TO CALCULATE IF TRIGLYCERIDE OVER 400 mg/dL 12/02/2019   LDLDIRECT 93 11/15/2012   TRIG 534 (H) 12/02/2019   CHOLHDL 6.7 12/02/2019  On Crestor 20, fenofibrate, Vascepa.  Also on Praluent started 12/2018.  - last eye exam was in 07/2018: No DR reportedly. Dr. Katy Fitch. New appointment coming up next month.  - no  numbness and tingling in her feet.  Pt has FH of DM in M, B, M uncle, MGM.  She was admitted for chest pain 10/2018 after an episode of dehydration and had heart catheterization at that time >> has another stent and the previous stent was recanalized.  She has a history of autoimmune hepatitis. Also, psoriasis, diagnosed as a child.  She lost a signif. amount of weight on [employer] [place] in 2018.    ROS: Constitutional: no weight gain/no weight loss, no fatigue, no subjective hyperthermia, no subjective hypothermia Eyes: no blurry vision, no xerophthalmia ENT: no sore throat, no nodules palpated in neck, no dysphagia, no odynophagia, no hoarseness Cardiovascular: no CP/no  SOB/no palpitations/no leg swelling Respiratory: no cough/no SOB/no wheezing Gastrointestinal: no N/no V/no D/no C/no acid reflux Musculoskeletal: no muscle aches/no joint aches Skin: no rashes, no hair loss Neurological: no tremors/no numbness/no tingling/no dizziness  I reviewed pt's medications, allergies, PMH, social hx, family hx, and changes were documented in the history of present illness. Otherwise, unchanged from my initial visit note.  Past Medical History:  Diagnosis Date  . Asthma   . Cancer ([place])    basal cell chest  . Complication of anesthesia   . Coronary artery disease   . Diabetes mellitus without complication ([place])    type 2  . Heart murmur   . Hepatitis    auto- immune  . History of kidney stones   . History of nuclear stress test 05/21/2010   dipyridamole; normal perfusion, preserved LV systolic EF of 25%  . Hyperlipidemia   . Hypertension   . Liver failure ([place])    h/o autoimmune hepatitis   . Myocardial infarction ([place])   . Neuropathy    resolved with back surgery; tingling and burning left leg to foot  . Obesity   . PONV (postoperative nausea and vomiting)   . Psoriasis   . Tubular adenoma of colon    colon path 01/09/2017   Past Surgical History:  Procedure Laterality Date  . Carotid Doppler  10/2008   R & L ICAs 0-49% diameter reduction   . COLONOSCOPY    . CORONARY ANGIOPLASTY WITH STENT PLACEMENT  09/2008   2.25x51m Cypher DES to in-stent restenosis of prox LAD; Mini-Vision stent x2 2.0x131mto area distal of initial LAD stent; 3 2.5x151maxus stents prox to Cypher stent in circumflex  . CORONARY ANGIOPLASTY WITH STENT PLACEMENT  09/2005   Cypher DES 3.0x22m31m distal RCA; 2.25x12mm37mi0Vision stent to mid LAD; 2.5x18mm 73mer stent to circumflex  . CORONARY STENT INTERVENTION N/A 11/13/2018   Procedure: CORONARY STENT INTERVENTION;  Surgeon: VaranaJettie Booze [facility];  Service: Cardiovascular;  Laterality: N/A;   . CORONARY STENT INTERVENTION N/A 12/02/2019   Procedure: CORONARY STENT INTERVENTION;  Surgeon: End, CNelva Bush [facility];  Service: Cardiovascular;  Laterality: N/A;  . LEFT HEART CATH AND CORONARY ANGIOGRAPHY N/A 11/13/2018   Procedure: LEFT HEART CATH AND CORONARY ANGIOGRAPHY;  Surgeon: VaranaJettie Booze [facility];  Service: Cardiovascular;  Laterality: N/A;  . LEFT HEART CATH AND CORONARY ANGIOGRAPHY N/A 12/02/2019   Procedure: LEFT HEART CATH AND CORONARY ANGIOGRAPHY;  Surgeon: End, CNelva Bush [facility];  Service: Cardiovascular;  Laterality: N/A;  . LUMBAR LAMINECTOMY/DECOMPRESSION MICRODISCECTOMY Left 10/18/2016   Procedure: Left Lumbar Four-Five Microdiscectomy;  Surgeon: Stern,Erline Levine [facility];  Laterality: Left;  Left L4-5 Microdiscectomy  . ROTATOR CUFF REPAIR Left   . SHOULDER ARTHROSCOPY W/ ROTATOR CUFF REPAIR Right   . TRANSTHORACIC ECHOCARDIOGRAM  04/2010   EF=>55%, borderline conc LVH; trace MR & TR; AV mildly sclerotic; aortic root sclerosis/calcif  . TRIGGER FINGER RELEASE Left 02/19/2020   Procedure: RELEASE TRIGGER FINGER/A-1 PULLEY;  Surgeon: Earlie Server, MD;  Location: [facility];  Service: Orthopedics;  Laterality: Left;  . ureteral stents     Social History   Socioeconomic History  . Marital status: Single    Spouse name: Not on file  . Number of children: 1  . Years of education: Not on file  . 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 Financial resource strain: Not on file  . Food insecurity    Worry: Not on file    Inability: Not on file  . Transportation needs    Medical: Not on file    Non-medical: Not on file  Tobacco Use  . Smoking status: Never Smoker  . Smokeless tobacco: Never Used  Substance and Sexual Activity  .  Alcohol use: No  . Drug use: No  . Sexual activity: Not Currently  Lifestyle  . Physical activity    Days per week: Not on file    Minutes per session: Not on file  . Stress: Not on file  Relationships  . Social Herbalist on phone: Not on file    Gets together: Not on file    Attends religious service: Not on file    Active member of club or organization: Not on file    Attends meetings of clubs or organizations: Not on file    Relationship status: Not on file  . Intimate partner violence    Fear of current or ex partner: Not on file    Emotionally abused: Not on file    Physically abused: Not on file    Forced sexual activity: Not on file  Other Topics Concern  . Not on file  Social History Narrative  . Not on file   Current Outpatient Medications on File Prior to Visit  Medication Sig Dispense Refill  . acetaminophen (TYLENOL) 325 MG tablet Take 325 mg by mouth every 6 (six) hours as needed for moderate pain or headache.    . Alirocumab (PRALUENT) 150 MG/ML SOAJ Inject 150 mg into the skin every 14 (fourteen) days. 6 pen 1  . Ascorbic Acid (VITAMIN C) 500 MG CAPS Take 500 mg by mouth daily.    Marland Kitchen aspirin EC 81 MG tablet Take 1 tablet (81 mg total) by mouth daily.    . Blood Glucose Monitoring Suppl (ONETOUCH VERIO) w/Device KIT Use to check blood sugar 2 times a day 1 kit 0  . BREO ELLIPTA 200-25 MCG/INH AEPB Inhale 1 puff into the lungs at bedtime.     . clopidogrel (PLAVIX) 75 MG tablet Take 75 mg by mouth daily.    . Coenzyme Q10 300 MG CAPS Take 300 mg by mouth daily.    . Cranberry (SM CRANBERRY) 300 MG tablet Take 300 mg by mouth every evening.    . fenofibrate (TRICOR) 145 MG tablet TAKE 1 TABLET (145 MG TOTAL) BY MOUTH EVERY EVENING. 90 tablet 1  . fluconazole (DIFLUCAN) 150 MG tablet Take 1 tablet (150 mg total) by mouth daily. Take 1 tablet today. Take 1 tablet in 1  week. 2 tablet 0  . glipiZIDE (GLUCOTROL) 5 MG tablet TAKE 0.5-1 TABLETS (2.5-5 MG TOTAL)  BY MOUTH DAILY BEFORE SUPPER. (Patient taking differently: Take 5 mg by mouth See admin instructions. Take 5 mg 30 minutes prior to a large meal as needed for high blood sugar) 90 tablet 2  . Glucosamine-Chondroit-Vit C-Mn (GLUCOSAMINE CHONDR 1500 COMPLX PO) Take 1 tablet by mouth daily.    Marland Kitchen glucose blood (ONETOUCH VERIO) test strip Use to check blood sugar 2 times a day 200 each 12  . HYDROcodone-acetaminophen (NORCO) 5-325 MG tablet Take 1 tablet by mouth every 6 (six) hours as needed for moderate pain. 20 tablet 0  . Insulin Pen Needle (BD PEN NEEDLE NANO U/F) 32G X 4 MM MISC Use as directed for insulin administration. 100 each 3  . isosorbide mononitrate (IMDUR) 30 MG 24 hr tablet TAKE 1 TABLET (30 MG TOTAL) BY MOUTH DAILY. ** DO NOT CRUSH ** 90 tablet 3  . ketoconazole (NIZORAL) 2 % cream Apply 1 application topically daily as needed for irritation.    Marland Kitchen LEVEMIR FLEXTOUCH 100 UNIT/ML FlexPen INJECT 50 UNITS INTO THE SKIN EVERY EVENING. 15 mL 3  . Melatonin 3 MG CAPS Take 1.5 mg by mouth at bedtime as needed (sleep).    . metFORMIN (GLUCOPHAGE) 1000 MG tablet TAKE 1 TABLET (1,000 MG TOTAL) BY MOUTH 2 (TWO) TIMES DAILY WITH A MEAL. 180 tablet 1  . metoprolol succinate (TOPROL-XL) 25 MG 24 hr tablet Take 1.5 tablets (37.5 mg total) by mouth daily. (Patient taking differently: Take 25 mg by mouth daily. ) 135 tablet 3  . Multiple Vitamins-Minerals (CENTRUM SILVER 50+WOMEN PO) Take 1 tablet by mouth daily.    . nitroGLYCERIN (NITROSTAT) 0.4 MG SL tablet Place 1 tablet (0.4 mg total) under the tongue every 5 (five) minutes as needed for chest pain (up to 3 doses). 25 tablet 3  . ReliOn Lancets Thin 26G MISC 1 Units by Does not apply George 2 (two) times daily. E 11.9 100 each 12  . rosuvastatin (CRESTOR) 20 MG tablet TAKE 1 TABLET BY MOUTH EVERY DAY (Patient taking differently: Take 20 mg by mouth daily. ) 90 tablet 3  . Semaglutide,0.25 or 0.5MG/DOS, (OZEMPIC, 0.25 OR 0.5 MG/DOSE,) 2 MG/1.5ML SOPN  Inject 0.5 mg into the skin every Monday. 6 pen 3  . sharps container For disposing of insulin pen needles 1 each 2  . sulfamethoxazole-trimethoprim (BACTRIM DS) 800-160 MG tablet Take 1 tablet by mouth 2 (two) times daily. 14 tablet 0  . valsartan (DIOVAN) 160 MG tablet TAKE 1 TABLET BY MOUTH EVERY DAY (Patient taking differently: Take 160 mg by mouth daily. ) 90 tablet 3  . VASCEPA 1 g capsule TAKE 2 CAPSULES (2 G TOTAL) BY MOUTH 2 (TWO) TIMES DAILY. 360 capsule 3   No current facility-administered medications on file prior to visit.   Allergies  Allergen Reactions  . Penicillins Anaphylaxis and Hives     PATIENT HAD A PCN REACTION WITH IMMEDIATE RASH, FACIAL/TONGUE/THROAT SWELLING, SOB, OR LIGHTHEADEDNESS WITH HYPOTENSION:  #  #  #  YES  #  #  #   Has patient had a PCN reaction causing severe rash involving mucus membranes or skin necrosis:Unknown Has patient had a PCN reaction that required hospitalization:No Has patient had a PCN reaction occurring within the last 10 years:No If all of the above answers are "NO", then may proceed with Cephalosporin use.   Dawna Part Flavor     Mouth breaks  out   . Other Other (See Comments)    Walnuts cause mouth to break out   Family History  Problem Relation Age of Onset  . CAD Mother   . CAD Father   . Diabetes Brother    PE: BP 134/74   Pulse 96   Ht _0  (1.626 m)   Wt 172 lb 6.4 oz (78.2 kg)   SpO2 99%   BMI 29.59 kg/m  Wt Readings from Last 3 Encounters:  03/17/20 172 lb 6.4 oz (78.2 kg)  03/06/20 172 lb 12.8 oz (78.4 kg)  02/19/20 173 lb 11.2 oz (78.8 kg)   Constitutional: overweight, in NAD Eyes: PERRLA, EOMI, no exophthalmos ENT: moist mucous membranes, no thyromegaly, no cervical lymphadenopathy Cardiovascular: tachycardia, RR, No RG, +2/6 SEM Respiratory: CTA B Gastrointestinal: abdomen soft, NT, ND, BS+ Musculoskeletal: no deformities, strength intact in all 4 Skin: moist, warm, no rashes Neurological: no tremor  with outstretched hands, DTR normal in all 4  ASSESSMENT: 1. DM2, insulin-dependent, uncontrolled, with long-term complications - CAD-s/p AMI, s/p stents, aortic valve stenosis - CKD  2. HL  3.  Obesity class I  PLAN:  1. Patient with longstanding, uncontrolled, type 2 diabetes, on oral antidiabetic regimen with Metformin and sulfonylurea and also weekly GLP-1 receptor agonist and daily long-acting insulin, which improved blood sugars in the last 1.5 years.  At last visit we did not change the regimen.  At that time, she had a significant weight loss after starting [place] but she could not tolerate it and had to stop.  At that time, we did not change the regimen but she was forgetting the glipizide frequently and I advised her to only use it larger meals.  HbA1c at last visit was 6.4%, but she had a more recent level from 1 month ago, slightly above letter, and 6.2%. -Since last visit, 3 months ago, she had an NSTEMI.  She recovered well afterwards. -Of note, we discussed in the past about adding an SGLT2 inhibitor, but she wanted to avoid this due to the possible increased urination. -At today's visit, her sugars are within target range, with few exceptions when she forgot either insulin or glipizide. However, when she takes all of the medicines, sugars are all at goal. In fact, her sugars in the morning are in the 80s to 90s when she takes 50 units of Levemir at bedtime. We discussed about decreasing the dose to 45 units only. We also discussed about using glipizide only with larger meals. -I refilled all of her diabetic medications now - I suggested to:  Patient Instructions  Please continue: - Metformin 1000 mg 2x a day with meals - Glipizide 2.5 to 5 mg before a large meal - Ozempic 0.5 mg weekly - Levemir 50 units at bedtime  Please come back for a follow-up appointment in 4 months.  - advised to check sugars at different times of the day - 1-2x a day, rotating check times - advised  for yearly eye exams >> she is not UTD but has an appointment coming up - return to clinic in 4 months  2. HL -Reviewed latest lipid panel from 11/2019: Triglycerides still very high, in the 500s, LDL could not be calculated: Lab Results  Component Value Date   CHOL 134 12/02/2019   HDL 20 (L) 12/02/2019   LDLCALC UNABLE TO CALCULATE IF TRIGLYCERIDE OVER 400 mg/dL 12/02/2019   LDLDIRECT 93 11/15/2012   TRIG 534 (H) 12/02/2019   CHOLHDL 6.7 12/02/2019  -  She continues on Crestor 20, fenofibrate 145 mg daily, Vascepa 2 g twice a day and Praluent 150 mg q2 weeks -no side effects  3.  Obesity class I -Continue GLP-1 receptor agonist which should also help with weight loss -Before last visit, she had a net weight loss of approximately 5 pounds since the previous visit, but lost more than 20 pounds due to GI side effects of O[place]la.  She stopped since then. -At this visit, she lost a net 8 pounds since last visit. She mentions that she does not have scheduled meals after the death of her husband.  [name], MD PhD [facility]

## 2020-03-26 ENCOUNTER — Encounter: Payer: Self-pay | Admitting: Family Medicine

## 2020-03-26 ENCOUNTER — Other Ambulatory Visit: Payer: Self-pay

## 2020-03-26 ENCOUNTER — Ambulatory Visit (INDEPENDENT_AMBULATORY_CARE_PROVIDER_SITE_OTHER): Payer: BC Managed Care – PPO | Admitting: Cardiovascular Disease

## 2020-03-26 VITALS — BP 136/84 | HR 75 | Ht 65.0 in | Wt 174.0 lb

## 2020-03-26 DIAGNOSIS — I10 Essential (primary) hypertension: Secondary | ICD-10-CM | POA: Diagnosis not present

## 2020-03-26 DIAGNOSIS — J4530 Mild persistent asthma, uncomplicated: Secondary | ICD-10-CM

## 2020-03-26 DIAGNOSIS — E663 Overweight: Secondary | ICD-10-CM

## 2020-03-26 DIAGNOSIS — I350 Nonrheumatic aortic (valve) stenosis: Secondary | ICD-10-CM

## 2020-03-26 DIAGNOSIS — Z794 Long term (current) use of insulin: Secondary | ICD-10-CM

## 2020-03-26 DIAGNOSIS — E119 Type 2 diabetes mellitus without complications: Secondary | ICD-10-CM

## 2020-03-26 DIAGNOSIS — I2510 Atherosclerotic heart disease of native coronary artery without angina pectoris: Secondary | ICD-10-CM

## 2020-03-26 DIAGNOSIS — E785 Hyperlipidemia, unspecified: Secondary | ICD-10-CM

## 2020-03-26 DIAGNOSIS — E1169 Type 2 diabetes mellitus with other specified complication: Secondary | ICD-10-CM

## 2020-03-26 MED ORDER — CARVEDILOL 6.25 MG PO TABS: 6.2500 mg | ORAL_TABLET | Freq: Two times a day (BID) | ORAL | 3 refills | Status: DC

## 2020-03-26 MED ORDER — RANOLAZINE ER 1000 MG PO TB12: 1000.0000 mg | ORAL_TABLET | Freq: Two times a day (BID) | ORAL | 3 refills | Status: DC

## 2020-03-26 MED ORDER — ISOSORBIDE MONONITRATE ER 30 MG PO TB24
30.0000 mg | ORAL_TABLET | Freq: Every day | ORAL | 3 refills | Status: DC
Start: 2020-03-26 — End: 2020-08-20

## 2020-03-26 MED ORDER — ICOSAPENT ETHYL 1 G PO CAPS: 2.0000 g | ORAL_CAPSULE | Freq: Two times a day (BID) | ORAL | 3 refills | Status: DC

## 2020-03-26 NOTE — Patient Instructions (Signed)
Medication Instructions:  STOP the Metoprolol START Carvedilol 6.125 mg twice a day START Ranexa 1000 mg twice a day. For the first week take 500 mg twice daily and then increase it to 1000 mg twice daily  *If you need a refill on your cardiac medications before your next appointment, please call your pharmacy*   Lab Work: None ordered If you have labs (blood work) drawn today and your tests are completely normal, you will receive your results only by: Marland Kitchen MyChart Message (if you have MyChart) OR . A paper copy in the mail If you have any lab test that is abnormal or we need to change your treatment, we will call you to review the results.   Testing/Procedures: None ordered   Follow-Up: At [facility], you and your health needs are our priority.  As part of our continuing mission to provide you with exceptional heart care, we have created designated Provider Care Teams.  These Care Teams include your primary Cardiologist (physician) and Advanced Practice Providers (APPs -  Physician Assistants and Nurse Practitioners) who all work together to provide you with the care you need, when you need it.  We recommend signing up for the patient portal called "MyChart".  Sign up information is provided on this After Visit Summary.  MyChart is used to connect with patients for Virtual Visits (Telemedicine).  Patients are able to view lab/test results, encounter notes, upcoming appointments, etc.  Non-urgent messages can be sent to your provider as well.   To learn more about what you can do with MyChart, go to [email or web address].    Your next appointment:   6 week(s)  The format for your next appointment:   In Person  Provider:   Sanda Klein, MD

## 2020-03-29 ENCOUNTER — Encounter: Payer: Self-pay | Admitting: Cardiovascular Disease

## 2020-03-29 ENCOUNTER — Other Ambulatory Visit: Payer: Self-pay | Admitting: Physician Assistant

## 2020-03-29 NOTE — Progress Notes (Signed)
Patient ID: Haley George, female   DOB: 05/26/1955, 64 y.o.   MRN: [id number]    Cardiology Office Note    Date:  03/29/2020   ID:  Haley George, DOB 11/19/1955, MRN [id number]  PCP:  Denita Lung, MD  Cardiologist:   Sanda Klein, MD   Chief Complaint  Patient presents with  . Follow-up    3 months.    History of Present Illness:  Haley George is a 64 y.o. female with diabetes mellitus type II, severe mixed hyperlipidemia, previous percutaneous revascularization procedures performed in all 3 major coronary arteries and restenosis requiring repeat PCI, mild calcific AS.  Most recent intervention was placement of 2 drug-eluting stents in the mid-distal left circumflex coronary artery covering a de novo mid vessel lesion and a distal in-stent restenosis lesion in the setting of small NSTEMI (peak troponin 1818) on 12/02/2019.  She developed unstable angina and she underwent cardiac catheterization on December 02, 2019.  The culprit lesion was found to be a 95% in-stent restenosis in the mid left circumflex coronary artery, as well as a de novo 70% mid left circumflex stenosis at the OM2 bifurcation.  This was treated by placement of overlapping 2.5x12 and 2.25x20 drug-eluting Synergy stents (Dr. Saunders Revel).  Also noted was a mild-moderate mid to distal LAD in-stent restenosis and an unchanged 80% RPDA lesion.  She had complete relief of her angina following this intervention.    Over the last 3 4 weeks she again developed symptoms of vague chest discomfort similar to previous angina.  They were mild and consistently resolved spontaneously.  They are not associated with exertion and occur relatively randomly and unpredictably, with some association with emotional periods.  She remembers having good response to ranolazine for these complaints in the past.  Paradoxically, the symptoms are not brought on by physical exertion.  Earlier this week on Monday she forgot to take her medications (including  metoprolol and clopidogrel) and actually had a great day, being quite physically active.  She takes both isosorbide and beta-blockers as antianginals.  She continues to struggle emotionally following the recent death of her husband, [name], about 3 months ago.  The transition has been very difficult for their daughter [name] as well and has led to some unfortunate complex over the holidays.  She is still untangling all the issues related to [employer] and that is also causing emotional pressure.  She denies shortness of breath at rest or with activity, orthopnea, PND, claudication, leg edema, focal neurological complaints, headaches, falls, injuries or bleeding.  She is usually extremely compliant with all her medications.  Her diabetes control remains excellent with a recent hemoglobin A1c of 6.2%.  Likewise her LDL cholesterol is very low, although she struggles with a lifelong pattern of insulin resistance with very low HDL and moderate hypertriglyceridemia.  Currently on rosuvastatin, Praluent, fenofibrate, Vascepa, as well as Ozempic.    Previous coronary interventions 2007  - mid LAD 2.2512 mini vision - left circumflex 2.518 Cypher  - RCA 3.023 Cypher  2010  - mid LAD 2.2512 Taxus overlapping 2.012 mini vision, overlapping another 2.012 mini vision -  left circumflex 2.516 Taxus upstream of previous stent.  July 2020  - mid LAD Synergy 2.5 x12 , overlapping the proximal area of restenosis in the mid LAD (short area of 3 stent layers).  August 2021 - mid LCX overlapping 2.5x12 and 2.25x20 drug-eluting Synergy (the distal stents overlaps the previously placed Taxus stent from 2010)  She had a normal nuclear stress test in November 2016. EF was 56%. She had a "false positive" ECG response.  In July 2020 she had a moderate sized area of ischemia in the anteroseptal wall, EF 52%, Prior to placement of the mid LAD stent.  Echo in August 2021 showed normal left ventricular regional  motion and EF 55-60%.  Past Medical History:  Diagnosis Date  . Asthma   . Cancer ([place])    basal cell chest  . Complication of anesthesia   . Coronary artery disease   . Diabetes mellitus without complication ([place])    type 2  . Heart murmur   . Hepatitis    auto- immune  . History of kidney stones   . History of nuclear stress test 05/21/2010   dipyridamole; normal perfusion, preserved LV systolic EF of 28%  . Hyperlipidemia   . Hypertension   . Liver failure ([place])    h/o autoimmune hepatitis   . Myocardial infarction ([place])   . Neuropathy    resolved with back surgery; tingling and burning left leg to foot  . Obesity   . PONV (postoperative nausea and vomiting)   . Psoriasis   . Tubular adenoma of colon    colon path 01/09/2017    Past Surgical History:  Procedure Laterality Date  . Carotid Doppler  10/2008   R & L ICAs 0-49% diameter reduction   . COLONOSCOPY    . CORONARY ANGIOPLASTY WITH STENT PLACEMENT  09/2008   2.25x69m Cypher DES to in-stent restenosis of prox LAD; Mini-Vision stent x2 2.0x120mto area distal of initial LAD stent; 3 2.5x1558maxus stents prox to Cypher stent in circumflex  . CORONARY ANGIOPLASTY WITH STENT PLACEMENT  09/2005   Cypher DES 3.0x22m58m distal RCA; 2.25x12mm68mi0Vision stent to mid LAD; 2.5x18mm 25mer stent to circumflex  . CORONARY STENT INTERVENTION N/A 11/13/2018   Procedure: CORONARY STENT INTERVENTION;  Surgeon: VaranaJettie Booze [facility];  Service: Cardiovascular;  Laterality: N/A;  . CORONARY STENT INTERVENTION N/A 12/02/2019   Procedure: CORONARY STENT INTERVENTION;  Surgeon: End, CNelva Bush [facility];  Service: Cardiovascular;  Laterality: N/A;  . LEFT HEART CATH AND CORONARY ANGIOGRAPHY N/A 11/13/2018   Procedure: LEFT HEART CATH AND CORONARY ANGIOGRAPHY;  Surgeon: VaranaJettie Booze [facility];  Service: Cardiovascular;  Laterality: N/A;  . LEFT  HEART CATH AND CORONARY ANGIOGRAPHY N/A 12/02/2019   Procedure: LEFT HEART CATH AND CORONARY ANGIOGRAPHY;  Surgeon: End, CNelva Bush [facility];  Service: Cardiovascular;  Laterality: N/A;  . LUMBAR LAMINECTOMY/DECOMPRESSION MICRODISCECTOMY Left 10/18/2016   Procedure: Left Lumbar Four-Five Microdiscectomy;  Surgeon: Stern,Erline Levine [facility]: [facility];[place]: Neurosurgery;  Laterality: Left;  Left L4-5 Microdiscectomy  . ROTATOR CUFF REPAIR Left   . SHOULDER ARTHROSCOPY W/ ROTATOR CUFF REPAIR Right   . TRANSTHORACIC ECHOCARDIOGRAM  04/2010   EF=>55%, borderline conc LVH; trace MR & TR; AV mildly sclerotic; aortic root sclerosis/calcif  . TRIGGER FINGER RELEASE Left 02/19/2020   Procedure: RELEASE TRIGGER FINGER/A-1 PULLEY;  Surgeon: CaffreEarlie Server Location: [facility];[place]: Orthopedics;  Laterality: Left;  . ureteral stents      Outpatient Medications Prior to Visit  Medication Sig Dispense Refill  . acetaminophen (TYLENOL) 325 MG tablet Take 325 mg by mouth every 6 (six) hours as needed for moderate pain or headache.    . Alirocumab (PRALUEBristol  150 MG/ML SOAJ Inject 150 mg into the skin every 14 (fourteen) days. 6 pen 1  . Ascorbic Acid (VITAMIN C) 500 MG CAPS Take 500 mg by mouth daily.    Marland Kitchen aspirin EC 81 MG tablet Take 1 tablet (81 mg total) by mouth daily.    . Blood Glucose Monitoring Suppl (ONETOUCH VERIO) w/Device KIT Use to check blood sugar 2 times a day 1 kit 0  . BREO ELLIPTA 200-25 MCG/INH AEPB Inhale 1 puff into the lungs at bedtime.     . clopidogrel (PLAVIX) 75 MG tablet Take 75 mg by mouth daily.    . Coenzyme Q10 300 MG CAPS Take 300 mg by mouth daily.    . Cranberry (SM CRANBERRY) 300 MG tablet Take 300 mg by mouth every evening.    . fenofibrate (TRICOR) 145 MG tablet TAKE 1 TABLET (145 MG TOTAL) BY MOUTH EVERY EVENING. 90 tablet 1  . fluconazole (DIFLUCAN) 150 MG tablet Take 1 tablet (150 mg total) by mouth daily. Take 1 tablet today. Take 1  tablet in 1 week. 2 tablet 0  . glipiZIDE (GLUCOTROL) 5 MG tablet Take 0.5-1 tablets (2.5-5 mg total) by mouth daily before supper. 90 tablet 3  . Glucosamine-Chondroit-Vit C-Mn (GLUCOSAMINE CHONDR 1500 COMPLX PO) Take 1 tablet by mouth daily.    Marland Kitchen glucose blood (ONETOUCH VERIO) test strip Use to check blood sugar 2 times a day 200 each 12  . HYDROcodone-acetaminophen (NORCO) 5-325 MG tablet Take 1 tablet by mouth every 6 (six) hours as needed for moderate pain. 20 tablet 0  . insulin detemir (LEVEMIR FLEXTOUCH) 100 UNIT/ML FlexPen Inject 45 Units into the skin at bedtime. 45 mL 3  . Insulin Pen Needle (BD PEN NEEDLE NANO U/F) 32G X 4 MM MISC Use as directed for insulin administration. 100 each 3  . ketoconazole (NIZORAL) 2 % cream Apply 1 application topically daily as needed for irritation.    . Melatonin 3 MG CAPS Take 1.5 mg by mouth at bedtime as needed (sleep).    . metFORMIN (GLUCOPHAGE) 1000 MG tablet Take 1 tablet (1,000 mg total) by mouth 2 (two) times daily with a meal. 180 tablet 3  . Multiple Vitamins-Minerals (CENTRUM SILVER 50+WOMEN PO) Take 1 tablet by mouth daily.    . nitroGLYCERIN (NITROSTAT) 0.4 MG SL tablet Place 1 tablet (0.4 mg total) under the tongue every 5 (five) minutes as needed for chest pain (up to 3 doses). 25 tablet 3  . ReliOn Lancets Thin 26G MISC 1 Units by Does not apply route 2 (two) times daily. E 11.9 100 each 12  . rosuvastatin (CRESTOR) 20 MG tablet TAKE 1 TABLET BY MOUTH EVERY DAY (Patient taking differently: Take 20 mg by mouth daily. ) 90 tablet 3  . Semaglutide,0.25 or 0.5MG/DOS, (OZEMPIC, 0.25 OR 0.5 MG/DOSE,) 2 MG/1.5ML SOPN Inject 0.5 mg into the skin every Monday. 4.5 mL 3  . sharps container For disposing of insulin pen needles 1 each 2  . sulfamethoxazole-trimethoprim (BACTRIM DS) 800-160 MG tablet Take 1 tablet by mouth 2 (two) times daily. 14 tablet 0  . valsartan (DIOVAN) 160 MG tablet TAKE 1 TABLET BY MOUTH EVERY DAY (Patient taking differently:  Take 160 mg by mouth daily. ) 90 tablet 3  . isosorbide mononitrate (IMDUR) 30 MG 24 hr tablet TAKE 1 TABLET (30 MG TOTAL) BY MOUTH DAILY. ** DO NOT CRUSH ** 90 tablet 3  . metoprolol succinate (TOPROL-XL) 25 MG 24 hr tablet Take 1.5 tablets (37.5  mg total) by mouth daily. (Patient taking differently: Take 25 mg by mouth daily. ) 135 tablet 3  . VASCEPA 1 g capsule TAKE 2 CAPSULES (2 G TOTAL) BY MOUTH 2 (TWO) TIMES DAILY. 360 capsule 3   No facility-administered medications prior to visit.     Allergies:   Penicillins, Coconut flavor, and Other   Social History   Socioeconomic History  . Marital status: Married    Spouse name: Not on file  . Number of children: 1  . Years of education: Not on file  . Highest education level: Not on file  Occupational History    Employer: [employer]  Tobacco Use  . Smoking status: Never Smoker  . Smokeless tobacco: Never Used  Vaping Use  . Vaping Use: Never used  Substance and Sexual Activity  . Alcohol use: No  . Drug use: No  . Sexual activity: Not Currently  Other Topics Concern  . Not on file  Social History Narrative  . Not on file   Social Determinants of Health   Financial Resource Strain:   . Difficulty of Paying Living Expenses: Not on file  Food Insecurity:   . Worried About Charity fundraiser in the Last Year: Not on file  . Ran Out of Food in the Last Year: Not on file  Transportation Needs:   . Lack of Transportation (Medical): Not on file  . Lack of Transportation (Non-Medical): Not on file  Physical Activity:   . Days of Exercise per Week: Not on file  . Minutes of Exercise per Session: Not on file  Stress:   . Feeling of Stress : Not on file  Social Connections:   . Frequency of Communication with Friends and Family: Not on file  . Frequency of Social Gatherings with Friends and Family: Not on file  . Attends Religious Services: Not on file  . Active Member of Clubs or Organizations: Not on file   . Attends Archivist Meetings: Not on file  . Marital Status: Not on file     ROS:   Please see the history of present illness.    ROS  The patient specifically denies any chest pain at rest exertion, dyspnea at rest or with exertion, orthopnea, paroxysmal nocturnal dyspnea, syncope, palpitations, focal neurological deficits, intermittent claudication, lower extremity edema, unexplained weight gain, cough, hemoptysis or wheezing.   PHYSICAL EXAM:   VS:  BP 136/84 (BP Location: Left Arm, Patient Position: Sitting, Cuff Size: Normal)   Pulse 75   Ht 5' 5" (1.651 m)   Wt 174 lb (78.9 kg)   BMI 28.96 kg/m      General: Alert, oriented x3, no distress, mildly overweight Head: no evidence of trauma, PERRL, EOMI, no exophtalmos or lid lag, no myxedema, no xanthelasma; normal ears, nose and oropharynx Neck: normal jugular venous pulsations and no hepatojugular reflux; brisk carotid pulses without delay and no carotid bruits Chest: clear to auscultation, no signs of consolidation by percussion or palpation, normal fremitus, symmetrical and full respiratory excursions Cardiovascular: normal position and quality of the apical impulse, regular rhythm, normal first and second heart sounds, early peaking 2/6 aortic ejection murmur, no diastolic murmurs, rubs or gallops Abdomen: no tenderness or distention, no masses by palpation, no abnormal pulsatility or arterial bruits, normal bowel sounds, no hepatosplenomegaly Extremities: no clubbing, cyanosis or edema; 2+ radial, ulnar and brachial pulses bilaterally; 2+ right femoral, posterior tibial and dorsalis pedis pulses; 2+ left femoral, posterior tibial and  dorsalis pedis pulses; no subclavian or femoral bruits Neurological: grossly nonfocal Psych: Normal mood and affect   Wt Readings from Last 3 Encounters:  03/26/20 174 lb (78.9 kg)  03/17/20 172 lb 6.4 oz (78.2 kg)  03/06/20 172 lb 12.8 oz (78.4 kg)    Studies/Labs Reviewed:    ECHO 10/30/2019:   1. Left ventricular ejection fraction, by estimation, is 55 to 60%. The  left ventricle has normal function. The left ventricle has no regional  wall motion abnormalities. Left ventricular diastolic parameters are  indeterminate.  2. Right ventricular systolic function is normal. The right ventricular  size is normal. There is normal pulmonary artery systolic pressure.  3. The mitral valve is normal in structure. Trivial mitral valve  regurgitation. No evidence of mitral stenosis.  4. The aortic valve is tricuspid. Aortic valve regurgitation is not  visualized. Mild aortic valve stenosis.  5. The inferior vena cava is normal in size with greater than 50%  respiratory variability, suggesting right atrial pressure of 3 mmHg.     12/02/2019 ECHO 1. Left ventricular ejection fraction, by estimation, is 55 to 60%. The  left ventricle has normal function. The left ventricle has no regional  wall motion abnormalities.  2. Right ventricular systolic function is normal. The right ventricular  size is normal.  3. The mitral valve is grossly normal. Trivial mitral valve  regurgitation. No evidence of mitral stenosis.  4. The inferior vena cava is normal in size with greater than 50%  respiratory variability, suggesting right atrial pressure of 3 mmHg.   EKG:  EKG is ordered today.  It shows normal sinus rhythm and is a completely normal tracing.  Unfortunately [facility]'s electrocardiograms have never really provided any clues towards upcoming acute coronary events.  Recent Labs: 12/03/2019: TSH 1.031 02/19/2020: ALT 24; BUN 18; Creatinine, Ser 1.13; Hemoglobin 12.0; Platelets 268; Potassium 4.3; Sodium 139   Lipid Panel    Component Value Date/Time   CHOL 134 12/02/2019 0336   CHOL 59 (L) 10/22/2019 1041   TRIG 534 (H) 12/02/2019 0336   HDL 20 (L) 12/02/2019 0336   HDL 20 (L) 10/22/2019 1041   CHOLHDL 6.7 12/02/2019 0336   VLDL UNABLE TO CALCULATE IF  TRIGLYCERIDE OVER 400 mg/dL 12/02/2019 0336   LDLCALC UNABLE TO CALCULATE IF TRIGLYCERIDE OVER 400 mg/dL 12/02/2019 0336   LDLCALC 8 10/22/2019 1041   LDLDIRECT 93 11/15/2012 0829    ASSESSMENT:    1. Coronary artery disease involving native coronary artery of native heart without angina pectoris   2. Hyperlipidemia associated with type 2 diabetes mellitus ([place])   3. Aortic valve stenosis, nonrheumatic   4. Essential hypertension   5. Insulin-requiring or dependent type II diabetes mellitus ([place])   6. Overweight   7. Mild persistent asthma without complication    PLAN:  In order of problems listed above:  1. CAD:   The most recent stent procedure was 12/02/2019 and she will continue dual antiplatelet with aspirin and clopidogrel at least through August 2022 (although I suspect we will elect for lifelong dual antiplatelet therapy).  It is a little too early to expect that she has in-stent restenosis.  Consider vasospasm/small vessel disease due to endothelial dysfunction.  Will resume ranolazine 500 mg twice daily for a week and 1000 mg twice daily.  Continue isosorbide and beta-blocker.  We will try to switch to carvedilol which may offer some antianginal and antivasospastic benefit over metoprolol. 2. HLP: On a very aggressive lipid-lowering therapy with  PCSK9 inhibitor, high-dose statin, Vascepa, fenofibrate.  All her lipid profile parameters have shown improvement compared with past years, but she continues to have very low HDL cholesterol (20) and moderately elevated triglycerides (500). 3. AS: The echocardiogram performed on October 30, 2019 still shows mild aortic stenosis.  We will follow up in 3-5 years unless she develops new symptoms. 4. HTN: Fair control, blood pressure is usually lower at home. 5. DM: Not surprising that her glycemic dysfunction will be more volatile during her recent period of bereavement, but overall maintains excellent glycemic control. 6. Overweight: She has lost  additional weight and is now firmly out of obesity range. 7. Reactive airway disease: No recent complaints of wheezing or need to use inhaler.  Asked her to call if this changes after she starts taking the left selective beta-blocker.     Medication Adjustments/Labs and Tests Ordered: Current medicines are reviewed at length with the patient today.  Concerns regarding medicines are outlined above.  Medication changes, Labs and Tests ordered today are listed in the Patient Instructions below. Patient Instructions  Medication Instructions:  STOP the Metoprolol START Carvedilol 6.125 mg twice a day START Ranexa 1000 mg twice a day. For the first week take 500 mg twice daily and then increase it to 1000 mg twice daily  *If you need a refill on your cardiac medications before your next appointment, please call your pharmacy*   Lab Work: None ordered If you have labs (blood work) drawn today and your tests are completely normal, you will receive your results only by: Marland Kitchen MyChart Message (if you have MyChart) OR . A paper copy in the mail If you have any lab test that is abnormal or we need to change your treatment, we will call you to review the results.   Testing/Procedures: None ordered   Follow-Up: At [facility], you and your health needs are our priority.  As part of our continuing mission to provide you with exceptional heart care, we have created designated Provider Care Teams.  These Care Teams include your primary Cardiologist (physician) and Advanced Practice Providers (APPs -  Physician Assistants and Nurse Practitioners) who all work together to provide you with the care you need, when you need it.  We recommend signing up for the patient portal called "MyChart".  Sign up information is provided on this After Visit Summary.  MyChart is used to connect with patients for Virtual Visits (Telemedicine).  Patients are able to view lab/test results, encounter notes, upcoming  appointments, etc.  Non-urgent messages can be sent to your provider as well.   To learn more about what you can do with MyChart, go to [email or web address].    Your next appointment:   6 week(s)  The format for your next appointment:   In Person  Provider:   Sanda Klein, MD          Signed, Sanda Klein, MD  03/29/2020 10:06 AM    [address], [place], [place]  [address] Phone: [phone]; Fax: [phone]

## 2020-03-30 NOTE — Telephone Encounter (Signed)
This is Dr. Croitoru's pt 

## 2020-04-02 ENCOUNTER — Other Ambulatory Visit: Payer: Self-pay | Admitting: Cardiovascular Disease

## 2020-04-06 LAB — HM DIABETES EYE EXAM

## 2020-04-14 ENCOUNTER — Other Ambulatory Visit: Payer: Self-pay | Admitting: Cardiovascular Disease

## 2020-04-22 ENCOUNTER — Other Ambulatory Visit: Payer: Self-pay | Admitting: Cardiovascular Disease

## 2020-04-23 ENCOUNTER — Telehealth: Payer: Self-pay | Admitting: Cardiovascular Disease

## 2020-04-23 NOTE — Telephone Encounter (Signed)
RN  verified with [facility] pharmacist - patient was ordered  A quantity of 6  injections with 3 refills.  this wil be be enough for a 1 year supply.   RN called patient and confirmed - she received  3 month supply for a year. Patient verbalized understanding.

## 2020-04-23 NOTE — Telephone Encounter (Signed)
*  STAT* If patient is at the pharmacy, call can be transferred to refill team.   1. Which medications need to be refilled? (please list name of each medication and dose if known)  PRALUENT 150 MG/ML SOAJ  2. Which pharmacy/location (including street and city if local pharmacy) is medication to be sent to? [id number] [place], [place] - [address]  3. Do they need a 30 day or 90 day supply? 90   Patient wanted to verify that the rx is for a 3 month supply not a one month supply with 3 refills. Please confirm

## 2020-05-06 IMAGING — DX CHEST - 2 VIEW
2 series · 2 of 2 positions shown · non-contrast
Comparison: 02/27/2017

CLINICAL DATA: Chest pain and shortness of breath.

EXAM:
CHEST - 2 VIEW

[chest pa]
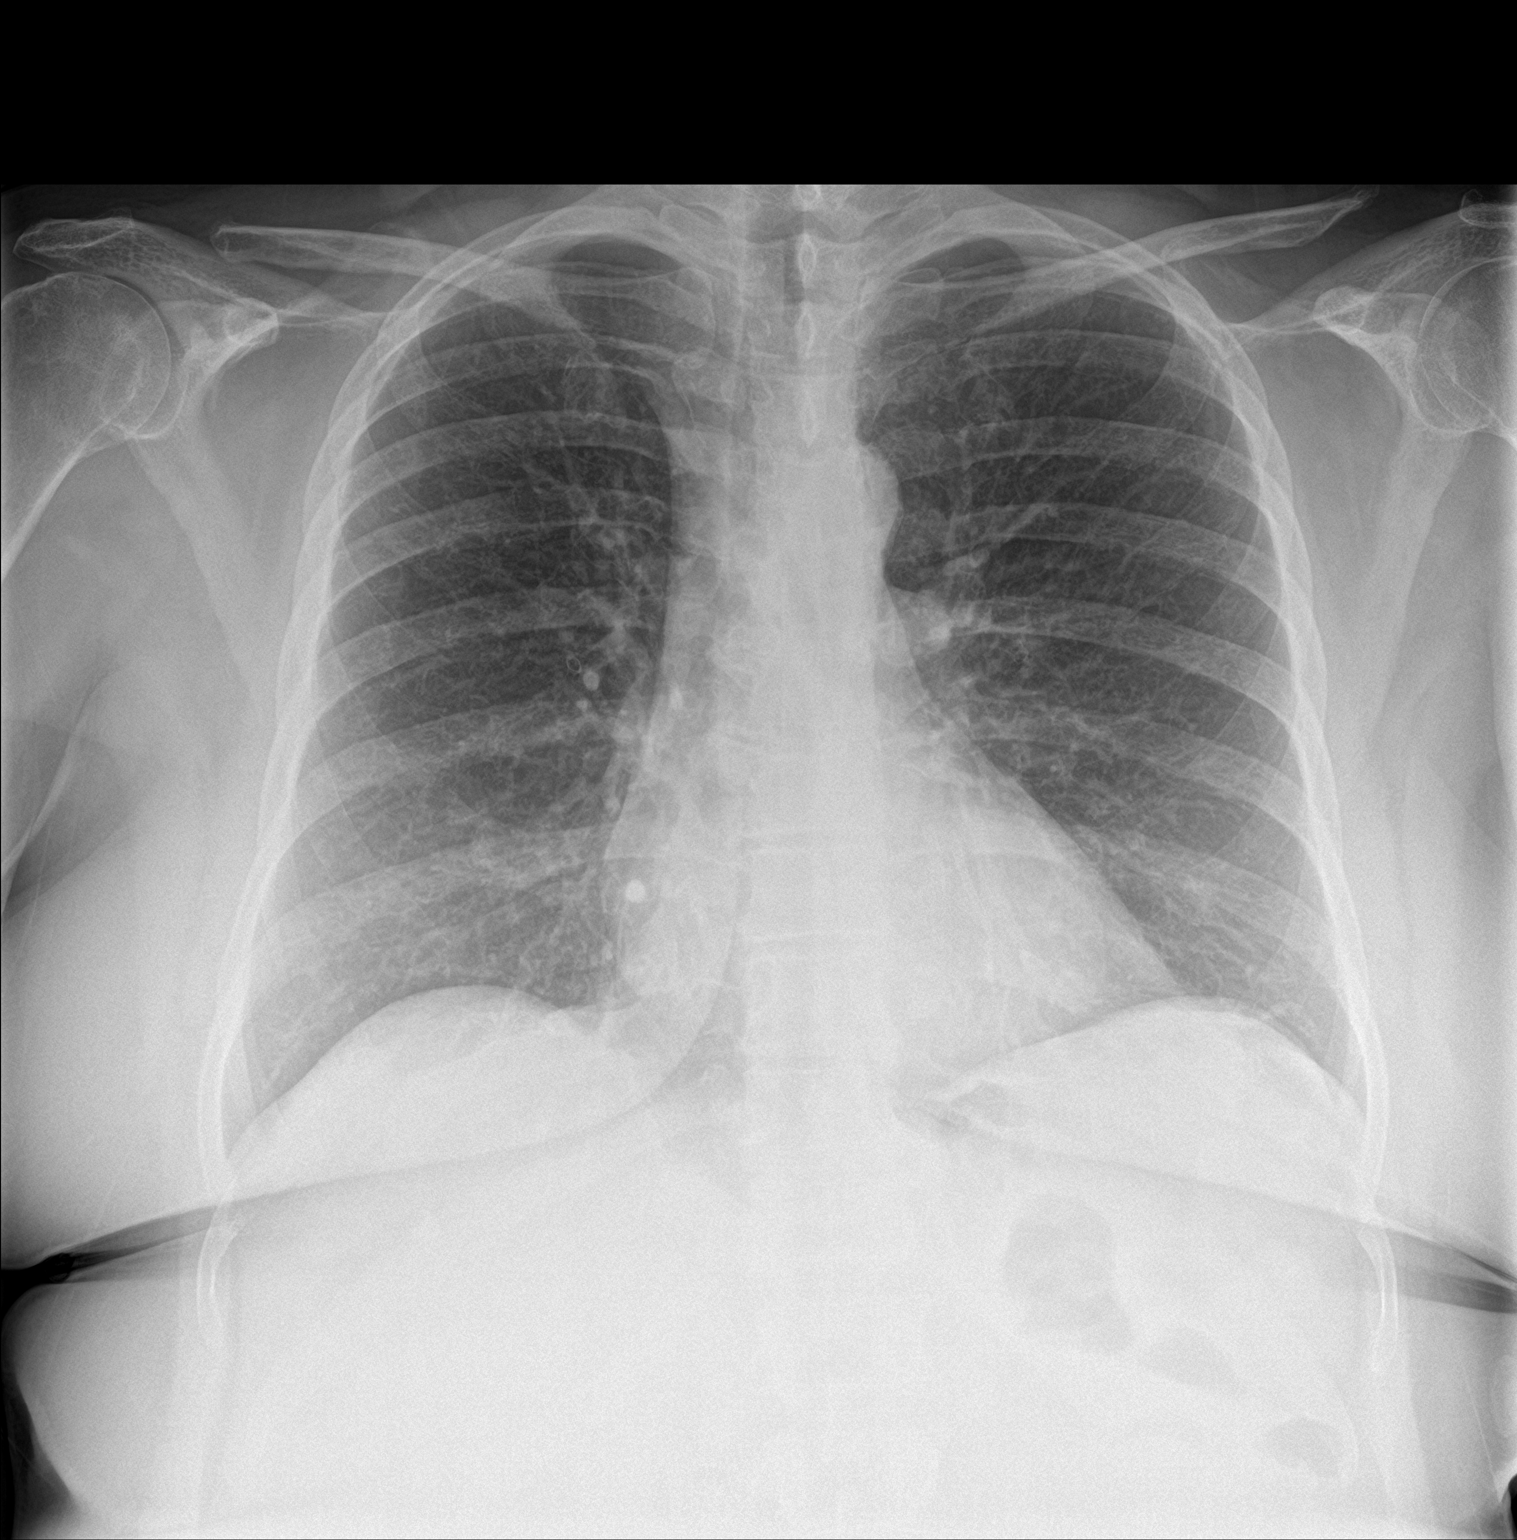

[chest lat]
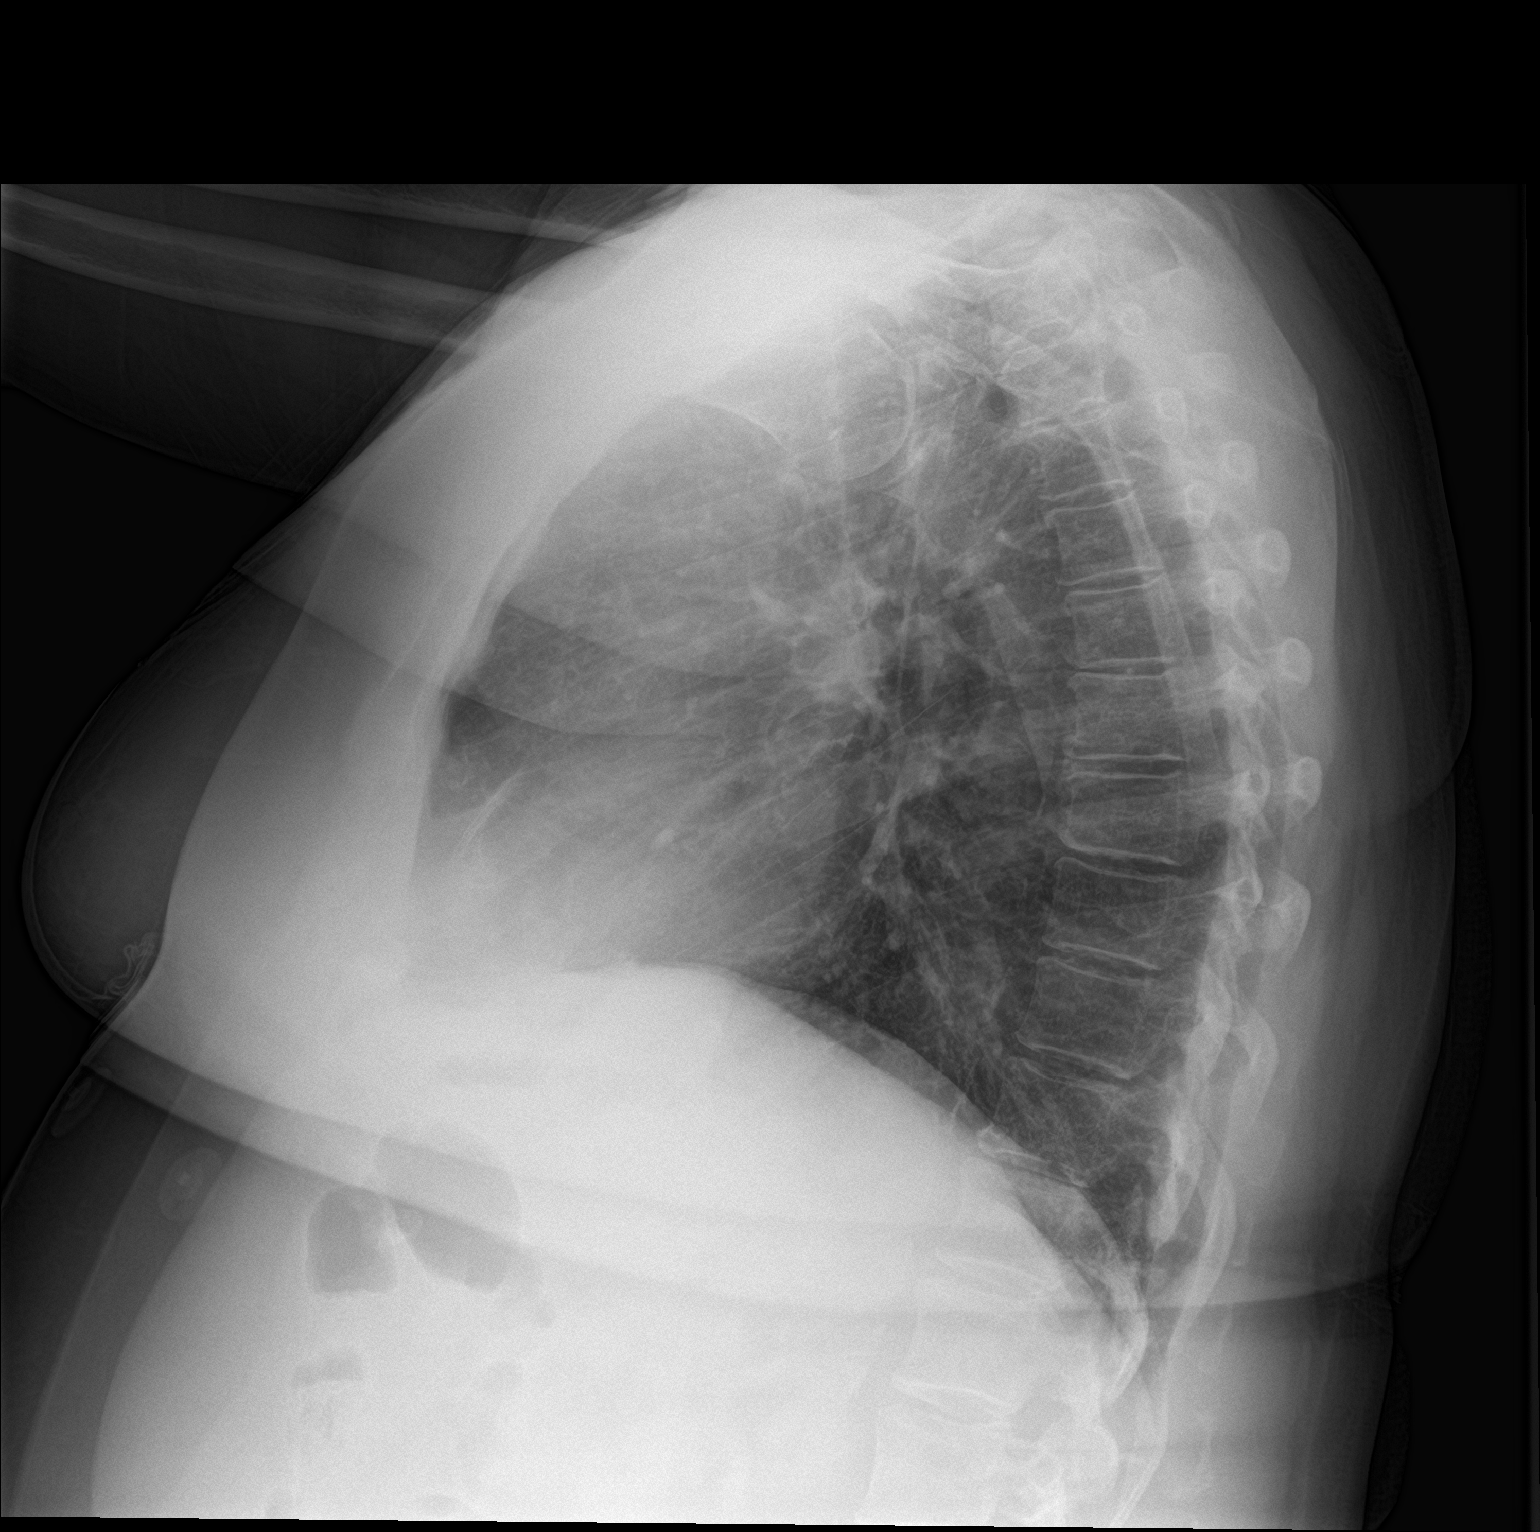

[2 of 2 positions shown; findings below may reference images not displayed]

FINDINGS: The cardiomediastinal contours are normal. Coronary stent
visualized. The lungs are clear. Pulmonary vasculature is normal. No
consolidation, pleural effusion, or pneumothorax. No acute osseous
abnormalities are seen.
IMPRESSION: No acute chest findings.

## 2020-05-14 ENCOUNTER — Encounter: Payer: Self-pay | Admitting: Cardiovascular Disease

## 2020-05-14 ENCOUNTER — Ambulatory Visit (INDEPENDENT_AMBULATORY_CARE_PROVIDER_SITE_OTHER): Payer: BC Managed Care – PPO | Admitting: Cardiovascular Disease

## 2020-05-14 ENCOUNTER — Other Ambulatory Visit: Payer: Self-pay

## 2020-05-14 VITALS — BP 132/80 | HR 88 | Ht 64.0 in | Wt 179.0 lb

## 2020-05-14 DIAGNOSIS — IMO0002 Reserved for concepts with insufficient information to code with codable children: Secondary | ICD-10-CM

## 2020-05-14 DIAGNOSIS — I25118 Atherosclerotic heart disease of native coronary artery with other forms of angina pectoris: Secondary | ICD-10-CM

## 2020-05-14 DIAGNOSIS — E669 Obesity, unspecified: Secondary | ICD-10-CM

## 2020-05-14 DIAGNOSIS — J4530 Mild persistent asthma, uncomplicated: Secondary | ICD-10-CM

## 2020-05-14 DIAGNOSIS — I350 Nonrheumatic aortic (valve) stenosis: Secondary | ICD-10-CM | POA: Diagnosis not present

## 2020-05-14 DIAGNOSIS — E782 Mixed hyperlipidemia: Secondary | ICD-10-CM

## 2020-05-14 DIAGNOSIS — I10 Essential (primary) hypertension: Secondary | ICD-10-CM

## 2020-05-14 DIAGNOSIS — E118 Type 2 diabetes mellitus with unspecified complications: Secondary | ICD-10-CM

## 2020-05-14 DIAGNOSIS — E1165 Type 2 diabetes mellitus with hyperglycemia: Secondary | ICD-10-CM

## 2020-05-14 NOTE — Patient Instructions (Signed)
Medication Instructions:  °No changes °*If you need a refill on your cardiac medications before your next appointment, please call your pharmacy* ° ° °Lab Work: °None ordered °If you have labs (blood work) drawn today and your tests are completely normal, you will receive your results only by: °• MyChart Message (if you have MyChart) OR °• A paper copy in the mail °If you have any lab test that is abnormal or we need to change your treatment, we will call you to review the results. ° ° °Testing/Procedures: °None ordered ° ° °Follow-Up: °At [facility], you and your health needs are our priority.  As part of our continuing mission to provide you with exceptional heart care, we have created designated Provider Care Teams.  These Care Teams include your primary Cardiologist (physician) and Advanced Practice Providers (APPs -  Physician Assistants and Nurse Practitioners) who all work together to provide you with the care you need, when you need it. ° °We recommend signing up for the patient portal called "MyChart".  Sign up information is provided on this After Visit Summary.  MyChart is used to connect with patients for Virtual Visits (Telemedicine).  Patients are able to view lab/test results, encounter notes, upcoming appointments, etc.  Non-urgent messages can be sent to your provider as well.   °To learn more about what you can do with MyChart, go to [email or web address].   ° °Your next appointment:   °3 month(s) ° °The format for your next appointment:   °In Person ° °Provider:   °You may see Mihai Croitoru, MD or one of the following Advanced Practice Providers on your designated Care Team:   °· Hao Meng, PA-C °· Angela Duke, PA-C or  °· Krista Kroeger, PA-C ° ° ° °

## 2020-05-16 NOTE — Progress Notes (Signed)
Patient ID: Haley George, female   DOB: 05-14-1955, 65 y.o.   MRN: [id number]    Cardiology Office Note    Date:  05/16/2020   ID:  Haley George, DOB Sep 30, 1955, MRN [id number]  PCP:  Denita Lung, MD  Cardiologist:   Sanda Klein, MD   Chief Complaint  Patient presents with  . Follow-up    6 weeks.    History of Present Illness:  Haley George is a 65 y.o. female with diabetes mellitus type II, severe mixed hyperlipidemia, previous percutaneous revascularization procedures performed in all 3 major coronary arteries and restenosis requiring repeat PCI, mild calcific aortic stenosis.  Most recent intervention was placement of 2 drug-eluting stents in the mid-distal left circumflex coronary artery covering a de novo mid vessel lesion and a distal in-stent restenosis lesion in the setting of small NSTEMI (peak troponin 1818) on 12/02/2019.  Her last appointment in December she seemed to be developing some problems with angina again and wanted to try ranolazine, she has picked up the prescription, but her symptoms have improved and she has not started the medication yet.  She did switch from metoprolol to carvedilol, just in case there was a component of vasospasm.  In fact just yesterday she was able to shovel snow and ice without chest discomfort.  Overall physically feeling better, although emotionally still under tremendous strain after her husband [name] has passed away (about 4 months ago).  She has a lot of support from friends and church members, but her grief is still severe.  The unexpected loss continues to strain the relationship with her only child, [name], who is having a difficult time with the loss of her father.  She denies exertional dyspnea, orthopnea or PND, lower extremity edema, intermittent claudication, new focal neurological complaints, dizziness or syncope, palpitations.  Risk factors are well addressed.  The most recent hemoglobin A1c was 6.2% and the most recent  LDL was possibly as low as 8. unable to calculate the LDL on the most recent lipid profile since the triglycerides were higher at 534.  This is likely due to weight gain and reduced physical activity, which she readily admits.  For the most part she sticks carefully to a low-carb diet.  She is compliant with statin, Praluent and Ozempic, as well as fenofibrate and Vascepa.  Blood pressure is borderline high today at 132/80, usually lower.  She developed unstable angina and she underwent cardiac catheterization on December 02, 2019.  The culprit lesion was found to be a 95% in-stent restenosis in the mid left circumflex coronary artery, as well as a de novo 70% mid left circumflex stenosis at the OM2 bifurcation.  This was treated by placement of overlapping 2.5x12 and 2.25x20 drug-eluting Synergy stents (Dr. Saunders Revel).  Also noted was a mild-moderate mid to distal LAD in-stent restenosis and an unchanged 80% RPDA lesion.  She had complete relief of her angina following this intervention.      Previous coronary interventions 2007  - mid LAD 2.2512 mini vision - left circumflex 2.518 Cypher  - RCA 3.023 Cypher  2010  - mid LAD 2.2512 Taxus overlapping 2.012 mini vision, overlapping another 2.012 mini vision -  left circumflex 2.516 Taxus upstream of previous stent.  July 2020  - mid LAD Synergy 2.5 x12 , overlapping the proximal area of restenosis in the mid LAD (short area of 3 stent layers).  August 2021 - mid LCX overlapping 2.5x12 and 2.25x20 drug-eluting Synergy (the distal stents  overlaps the previously placed Taxus stent from 2010)  She had a normal nuclear stress test in November 2016. EF was 56%. She had a "false positive" ECG response.  In July 2020 she had a moderate sized area of ischemia in the anteroseptal wall, EF 52%, Prior to placement of the mid LAD stent.  Echo in August 2021 showed normal left ventricular regional motion and EF 55-60%.  Past Medical History:  Diagnosis Date   . Asthma   . Cancer ([place])    basal cell chest  . Complication of anesthesia   . Coronary artery disease   . Diabetes mellitus without complication ([place])    type 2  . Heart murmur   . Hepatitis    auto- immune  . History of kidney stones   . History of nuclear stress test 05/21/2010   dipyridamole; normal perfusion, preserved LV systolic EF of 69%  . Hyperlipidemia   . Hypertension   . Liver failure ([place])    h/o autoimmune hepatitis   . Myocardial infarction ([place])   . Neuropathy    resolved with back surgery; tingling and burning left leg to foot  . Obesity   . PONV (postoperative nausea and vomiting)   . Psoriasis   . Tubular adenoma of colon    colon path 01/09/2017    Past Surgical History:  Procedure Laterality Date  . Carotid Doppler  10/2008   R & L ICAs 0-49% diameter reduction   . COLONOSCOPY    . CORONARY ANGIOPLASTY WITH STENT PLACEMENT  09/2008   2.25x91m Cypher DES to in-stent restenosis of prox LAD; Mini-Vision stent x2 2.0x149mto area distal of initial LAD stent; 3 2.5x1576maxus stents prox to Cypher stent in circumflex  . CORONARY ANGIOPLASTY WITH STENT PLACEMENT  09/2005   Cypher DES 3.0x22m70m distal RCA; 2.25x12mm53mi0Vision stent to mid LAD; 2.5x18mm 34mer stent to circumflex  . CORONARY STENT INTERVENTION N/A 11/13/2018   Procedure: CORONARY STENT INTERVENTION;  Surgeon: VaranaJettie Booze [facility];  Service: Cardiovascular;  Laterality: N/A;  . CORONARY STENT INTERVENTION N/A 12/02/2019   Procedure: CORONARY STENT INTERVENTION;  Surgeon: End, CNelva Bush [facility];  Service: Cardiovascular;  Laterality: N/A;  . LEFT HEART CATH AND CORONARY ANGIOGRAPHY N/A 11/13/2018   Procedure: LEFT HEART CATH AND CORONARY ANGIOGRAPHY;  Surgeon: VaranaJettie Booze [facility];  Service: Cardiovascular;  Laterality: N/A;  . LEFT HEART CATH AND CORONARY ANGIOGRAPHY N/A 12/02/2019   Procedure: LEFT  HEART CATH AND CORONARY ANGIOGRAPHY;  Surgeon: End, CNelva Bush [facility];  Service: Cardiovascular;  Laterality: N/A;  . LUMBAR LAMINECTOMY/DECOMPRESSION MICRODISCECTOMY Left 10/18/2016   Procedure: Left Lumbar Four-Five Microdiscectomy;  Surgeon: Stern,Erline Levine [facility];[place]: Neurosurgery;  Laterality: Left;  Left L4-5 Microdiscectomy  . ROTATOR CUFF REPAIR Left   . SHOULDER ARTHROSCOPY W/ ROTATOR CUFF REPAIR Right   . TRANSTHORACIC ECHOCARDIOGRAM  04/2010   EF=>55%, borderline conc LVH; trace MR & TR; AV mildly sclerotic; aortic root sclerosis/calcif  . TRIGGER FINGER RELEASE Left 02/19/2020   Procedure: RELEASE TRIGGER FINGER/A-1 PULLEY;  Surgeon: CaffreEarlie Server [facility];[place]: Orthopedics;  Laterality: Left;  . ureteral stents      Outpatient Medications Prior to Visit  Medication Sig Dispense Refill  . acetaminophen (TYLENOL) 325 MG tablet Take 325 mg by mouth every 6 (six) hours as needed for moderate  pain or headache.    . Ascorbic Acid (VITAMIN C) 500 MG CAPS Take 500 mg by mouth daily.    Marland Kitchen aspirin EC 81 MG tablet Take 1 tablet (81 mg total) by mouth daily.    . Blood Glucose Monitoring Suppl (ONETOUCH VERIO) w/Device KIT Use to check blood sugar 2 times a day 1 kit 0  . BREO ELLIPTA 200-25 MCG/INH AEPB Inhale 1 puff into the lungs at bedtime.     . carvedilol (COREG) 6.25 MG tablet Take 1 tablet (6.25 mg total) by mouth 2 (two) times daily. 180 tablet 3  . clopidogrel (PLAVIX) 75 MG tablet TAKE 1 TABLET (75 MG TOTAL) BY MOUTH DAILY WITH BREAKFAST. 90 tablet 2  . Coenzyme Q10 300 MG CAPS Take 300 mg by mouth daily.    . Cranberry (SM CRANBERRY) 300 MG tablet Take 300 mg by mouth every evening.    . fenofibrate (TRICOR) 145 MG tablet TAKE 1 TABLET (145 MG TOTAL) BY MOUTH EVERY EVENING. 90 tablet 1  . fluconazole (DIFLUCAN) 150 MG tablet Take 1 tablet (150 mg total) by mouth daily. Take 1 tablet today. Take 1 tablet in 1 week.  2 tablet 0  . glipiZIDE (GLUCOTROL) 5 MG tablet Take 0.5-1 tablets (2.5-5 mg total) by mouth daily before supper. 90 tablet 3  . Glucosamine-Chondroit-Vit C-Mn (GLUCOSAMINE CHONDR 1500 COMPLX PO) Take 1 tablet by mouth daily.    Marland Kitchen glucose blood (ONETOUCH VERIO) test strip Use to check blood sugar 2 times a day 200 each 12  . HYDROcodone-acetaminophen (NORCO) 5-325 MG tablet Take 1 tablet by mouth every 6 (six) hours as needed for moderate pain. 20 tablet 0  . icosapent Ethyl (VASCEPA) 1 g capsule Take 2 capsules (2 g total) by mouth 2 (two) times daily. 360 capsule 3  . insulin detemir (LEVEMIR FLEXTOUCH) 100 UNIT/ML FlexPen Inject 45 Units into the skin at bedtime. 45 mL 3  . Insulin Pen Needle (BD PEN NEEDLE NANO U/F) 32G X 4 MM MISC Use as directed for insulin administration. 100 each 3  . isosorbide mononitrate (IMDUR) 30 MG 24 hr tablet Take 1 tablet (30 mg total) by mouth daily. ** DO NOT CRUSH ** 90 tablet 3  . ketoconazole (NIZORAL) 2 % cream Apply 1 application topically daily as needed for irritation.    . Melatonin 3 MG CAPS Take 1.5 mg by mouth at bedtime as needed (sleep).    . metFORMIN (GLUCOPHAGE) 1000 MG tablet Take 1 tablet (1,000 mg total) by mouth 2 (two) times daily with a meal. 180 tablet 3  . Multiple Vitamins-Minerals (CENTRUM SILVER 50+WOMEN PO) Take 1 tablet by mouth daily.    . nitroGLYCERIN (NITROSTAT) 0.4 MG SL tablet PLACE 1 TABLET (0.4 MG TOTAL) UNDER THE TONGUE EVERY 5 (FIVE) MINUTES AS NEEDED FOR CHEST PAIN (UP TO 3 DOSES). 75 tablet 1  . PRALUENT 150 MG/ML SOAJ INJECT 150 MG INTO THE SKIN EVERY 14 (FOURTEEN) DAYS. 6 mL 3  . ranolazine (RANEXA) 1000 MG SR tablet Take 1 tablet (1,000 mg total) by mouth 2 (two) times daily. 180 tablet 3  . ReliOn Lancets Thin 26G MISC 1 Units by Does not apply route 2 (two) times daily. E 11.9 100 each 12  . rosuvastatin (CRESTOR) 20 MG tablet TAKE 1 TABLET BY MOUTH EVERY DAY (Patient taking differently: Take 20 mg by mouth daily. ) 90  tablet 3  . Semaglutide,0.25 or 0.5MG/DOS, (OZEMPIC, 0.25 OR 0.5 MG/DOSE,) 2 MG/1.5ML SOPN Inject 0.5 mg  into the skin every Monday. 4.5 mL 3  . sharps container For disposing of insulin pen needles 1 each 2  . sulfamethoxazole-trimethoprim (BACTRIM DS) 800-160 MG tablet Take 1 tablet by mouth 2 (two) times daily. 14 tablet 0  . valsartan (DIOVAN) 160 MG tablet TAKE 1 TABLET BY MOUTH EVERY DAY (Patient taking differently: Take 160 mg by mouth daily. ) 90 tablet 3   No facility-administered medications prior to visit.     Allergies:   Penicillins, Coconut flavor, and Other   Social History   Socioeconomic History  . Marital status: Married    Spouse name: Not on file  . Number of children: 1  . Years of education: Not on file  . Highest education level: Not on file  Occupational History    Employer: [employer]  Tobacco Use  . Smoking status: Never Smoker  . Smokeless tobacco: Never Used  Vaping Use  . Vaping Use: Never used  Substance and Sexual Activity  . Alcohol use: No  . Drug use: No  . Sexual activity: Not Currently  Other Topics Concern  . Not on file  Social History Narrative  . Not on file   Social Determinants of Health   Financial Resource Strain: Not on file  Food Insecurity: Not on file  Transportation Needs: Not on file  Physical Activity: Not on file  Stress: Not on file  Social Connections: Not on file     ROS:   Please see the history of present illness.    ROS  The patient specifically denies any chest pain at rest exertion, dyspnea at rest or with exertion, orthopnea, paroxysmal nocturnal dyspnea, syncope, palpitations, focal neurological deficits, intermittent claudication, lower extremity edema, unexplained weight gain, cough, hemoptysis or wheezing.   PHYSICAL EXAM:   VS:  BP 132/80 (BP Location: Left Arm, Patient Position: Sitting, Cuff Size: Normal)   Pulse 88   Ht 5' 4" (1.626 m)   Wt 179 lb (81.2 kg)   BMI 30.73 kg/m        General: Alert, oriented x3, no distress, borderline obese Head: no evidence of trauma, PERRL, EOMI, no exophtalmos or lid lag, no myxedema, no xanthelasma; normal ears, nose and oropharynx Neck: normal jugular venous pulsations and no hepatojugular reflux; brisk carotid pulses without delay and no carotid bruits Chest: clear to auscultation, no signs of consolidation by percussion or palpation, normal fremitus, symmetrical and full respiratory excursions Cardiovascular: normal position and quality of the apical impulse, regular rhythm, normal first and second heart sounds, 3/6 early peaking aortic ejection murmur, no diastolic murmurs, rubs or gallops Abdomen: no tenderness or distention, no masses by palpation, no abnormal pulsatility or arterial bruits, normal bowel sounds, no hepatosplenomegaly Extremities: no clubbing, cyanosis or edema; 2+ radial, ulnar and brachial pulses bilaterally; 2+ right femoral, posterior tibial and dorsalis pedis pulses; 2+ left femoral, posterior tibial and dorsalis pedis pulses; no subclavian or femoral bruits Neurological: grossly nonfocal Psych: Normal mood and affect   Wt Readings from Last 3 Encounters:  05/14/20 179 lb (81.2 kg)  03/26/20 174 lb (78.9 kg)  03/17/20 172 lb 6.4 oz (78.2 kg)    Studies/Labs Reviewed:   ECHO 10/30/2019:   1. Left ventricular ejection fraction, by estimation, is 55 to 60%. The  left ventricle has normal function. The left ventricle has no regional  wall motion abnormalities. Left ventricular diastolic parameters are  indeterminate.  2. Right ventricular systolic function is normal. The right ventricular  size is  normal. There is normal pulmonary artery systolic pressure.  3. The mitral valve is normal in structure. Trivial mitral valve  regurgitation. No evidence of mitral stenosis.  4. The aortic valve is tricuspid. Aortic valve regurgitation is not  visualized. Mild aortic valve stenosis.  5. The  inferior vena cava is normal in size with greater than 50%  respiratory variability, suggesting right atrial pressure of 3 mmHg.     12/02/2019 ECHO 1. Left ventricular ejection fraction, by estimation, is 55 to 60%. The  left ventricle has normal function. The left ventricle has no regional  wall motion abnormalities.  2. Right ventricular systolic function is normal. The right ventricular  size is normal.  3. The mitral valve is grossly normal. Trivial mitral valve  regurgitation. No evidence of mitral stenosis.  4. The inferior vena cava is normal in size with greater than 50%  respiratory variability, suggesting right atrial pressure of 3 mmHg.   EKG:  EKG is not ordered today.  The ECG from December shows normal sinus rhythm and is a completely normal tracing.  Unfortunately Myrissa's electrocardiograms have never really provided any clues towards upcoming acute coronary events.  Recent Labs: 12/03/2019: TSH 1.031 02/19/2020: ALT 24; BUN 18; Creatinine, Ser 1.13; Hemoglobin 12.0; Platelets 268; Potassium 4.3; Sodium 139   Lipid Panel    Component Value Date/Time   CHOL 134 12/02/2019 0336   CHOL 59 (L) 10/22/2019 1041   TRIG 534 (H) 12/02/2019 0336   HDL 20 (L) 12/02/2019 0336   HDL 20 (L) 10/22/2019 1041   CHOLHDL 6.7 12/02/2019 0336   VLDL UNABLE TO CALCULATE IF TRIGLYCERIDE OVER 400 mg/dL 12/02/2019 0336   LDLCALC UNABLE TO CALCULATE IF TRIGLYCERIDE OVER 400 mg/dL 12/02/2019 0336   LDLCALC 8 10/22/2019 1041   LDLDIRECT 93 11/15/2012 0829    ASSESSMENT:    1. Coronary artery disease of native artery of native heart with stable angina pectoris ([place])   2. Mixed hyperlipidemia   3. Aortic valve stenosis, nonrheumatic   4. Essential hypertension   5. Diabetes mellitus type 2, uncontrolled, with complications ([place])   6. Mild obesity   7. Mild persistent asthma without complication    PLAN:  In order of problems listed above:  1. CAD:   Symptoms have subsided.  She  was able to perform relatively intense physical activity without angina.  Switched over to carvedilol from metoprolol, but she has not started the ranolazine yet.  This may suggest that vasospasm is a part of her anginal syndrome.  The most recent stent procedure was 12/02/2019 and she will continue dual antiplatelet with aspirin and clopidogrel at least through August 2022 (although I suspect we will elect for lifelong dual antiplatelet therapy).   Continue isosorbide and carvedilol. 2. HLP: On a very aggressive lipid-lowering therapy with PCSK9 inhibitor, high-dose statin, Vascepa, fenofibrate.  All her lipid profile parameters have shown improvement compared with past years, but she continues to have very low HDL cholesterol (20) and moderately elevated triglycerides (500).  She has gained a little weight since [name] passed away and this is probably part of the reason her triglycerides are worse. 3. AS: The echocardiogram performed on October 30, 2019 still shows mild aortic stenosis.  We will follow up in 3-5 years unless she develops new symptoms.  No symptoms of aortic stenosis at this time. 4. HTN: Well-controlled on carvedilol and valsartan 5. DM: Maintains excellent glycemic control despite weight gain and reduced physical activity.  She has a follow-up  visit with her endocrinologist on April 5. 6. Obesity: Has gained a few pounds over the winter and is back in obese range. 7. Reactive airway disease: No reports of increased wheezing after switching to carvedilol.     Medication Adjustments/Labs and Tests Ordered: Current medicines are reviewed at length with the patient today.  Concerns regarding medicines are outlined above.  Medication changes, Labs and Tests ordered today are listed in the Patient Instructions below. Patient Instructions  Medication Instructions:  No changes *If you need a refill on your cardiac medications before your next appointment, please call your pharmacy*   Lab  Work: None ordered If you have labs (blood work) drawn today and your tests are completely normal, you will receive your results only by: Marland Kitchen MyChart Message (if you have MyChart) OR . A paper copy in the mail If you have any lab test that is abnormal or we need to change your treatment, we will call you to review the results.   Testing/Procedures: None ordered   Follow-Up: At [facility], you and your health needs are our priority.  As part of our continuing mission to provide you with exceptional heart care, we have created designated Provider Care Teams.  These Care Teams include your primary Cardiologist (physician) and Advanced Practice Providers (APPs -  Physician Assistants and Nurse Practitioners) who all work together to provide you with the care you need, when you need it.  We recommend signing up for the patient portal called "MyChart".  Sign up information is provided on this After Visit Summary.  MyChart is used to connect with patients for Virtual Visits (Telemedicine).  Patients are able to view lab/test results, encounter notes, upcoming appointments, etc.  Non-urgent messages can be sent to your provider as well.   To learn more about what you can do with MyChart, go to [email or web address].    Your next appointment:   3 month(s)  The format for your next appointment:   In Person  Provider:   You may see Sanda Klein, MD or one of the following Advanced Practice Providers on your designated Care Team:    Almyra Deforest, PA-C  Fabian Sharp, [place] or   Roby Lofts, PA-C         Signed, Sanda Klein, MD  05/16/2020 3:51 PM    [address], [place], [place]  [address] Phone: [phone]; Fax: [phone]

## 2020-05-30 ENCOUNTER — Other Ambulatory Visit: Payer: Self-pay | Admitting: Family Medicine

## 2020-05-30 DIAGNOSIS — I152 Hypertension secondary to endocrine disorders: Secondary | ICD-10-CM

## 2020-06-03 ENCOUNTER — Other Ambulatory Visit: Payer: Self-pay | Admitting: Cardiovascular Disease

## 2020-06-10 ENCOUNTER — Telehealth: Payer: Self-pay | Admitting: Family Medicine

## 2020-06-10 NOTE — Telephone Encounter (Signed)
Pt called and wants to know if she can get the 4th phizer booster she had the 3rd booster on 01/28/2020, states she has a lot of health concerns and would like to get a 4th one

## 2020-06-11 NOTE — Telephone Encounter (Signed)
Although there is talk about this, it is not recommended yet

## 2020-06-11 NOTE — Telephone Encounter (Signed)
Lvm advising pt [facility]

## 2020-06-20 ENCOUNTER — Other Ambulatory Visit: Payer: Self-pay | Admitting: Cardiovascular Disease

## 2020-06-21 ENCOUNTER — Other Ambulatory Visit: Payer: Self-pay | Admitting: Internal Medicine

## 2020-06-22 ENCOUNTER — Encounter: Payer: Self-pay | Admitting: Medical

## 2020-06-22 ENCOUNTER — Ambulatory Visit: Payer: BC Managed Care – PPO | Admitting: Medical

## 2020-06-22 ENCOUNTER — Other Ambulatory Visit: Payer: Self-pay

## 2020-06-22 VITALS — BP 140/90 | HR 88 | Temp 98.0°F | Wt 181.6 lb

## 2020-06-22 DIAGNOSIS — R195 Other fecal abnormalities: Secondary | ICD-10-CM | POA: Diagnosis not present

## 2020-06-22 DIAGNOSIS — R300 Dysuria: Secondary | ICD-10-CM | POA: Diagnosis not present

## 2020-06-22 DIAGNOSIS — M6289 Other specified disorders of muscle: Secondary | ICD-10-CM

## 2020-06-22 DIAGNOSIS — N3946 Mixed incontinence: Secondary | ICD-10-CM | POA: Diagnosis not present

## 2020-06-22 LAB — POCT URINALYSIS DIP (CLINITEK)
Bilirubin, UA: NEGATIVE
Glucose, UA: NEGATIVE mg/dL
Ketones, POC UA: NEGATIVE mg/dL
Nitrite, UA: NEGATIVE
POC PROTEIN,UA: NEGATIVE
Spec Grav, UA: >=1.03 — AB (ref 1.010–1.025)
Urobilinogen, UA: 0.2 E.U./dL
pH, UA: 5 (ref 5.0–8.0)

## 2020-06-22 MED ORDER — NITROFURANTOIN MONOHYD MACRO 100 MG PO CAPS: 100.0000 mg | ORAL_CAPSULE | Freq: Two times a day (BID) | ORAL | 0 refills | Status: DC

## 2020-06-22 NOTE — Progress Notes (Signed)
Done

## 2020-06-22 NOTE — Progress Notes (Signed)
Subjective:   Haley George is a 65 y.o. female who complains of possible urinary tract infection.  I saw her in November for the same.  She reports a few day history of urinary urgency, cloudy urine, some discomfort with urination.  No back pain.  No abdominal pain.  No blood in the urine, no fever  nausea vomiting.  She has had some loose stools this past week.  She also notes some problems with occasional leakage from the anus.  She thinks this contributed to the current urinary tract infection.  She also has a bad problem with incontinence of urine in general.  This is been ongoing.  She has tried Kegel exercises but does not like it is helping.   No other aggravating or relieving factors.  No other c/o.  Past Medical History:  Diagnosis Date  . Asthma   . Cancer ([place])    basal cell chest  . Complication of anesthesia   . Coronary artery disease   . Diabetes mellitus without complication ([place])    type 2  . Heart murmur   . Hepatitis    auto- immune  . History of kidney stones   . History of nuclear stress test 05/21/2010   dipyridamole; normal perfusion, preserved LV systolic EF of 49%  . Hyperlipidemia   . Hypertension   . Liver failure ([place])    h/o autoimmune hepatitis   . Myocardial infarction ([place])   . Neuropathy    resolved with back surgery; tingling and burning left leg to foot  . Obesity   . PONV (postoperative nausea and vomiting)   . Psoriasis   . Tubular adenoma of colon    colon path 01/09/2017    Current Outpatient Medications on File Prior to Visit  Medication Sig Dispense Refill  . Ascorbic Acid (VITAMIN C) 500 MG CAPS Take 500 mg by mouth daily.    Marland Kitchen aspirin EC 81 MG tablet Take 1 tablet (81 mg total) by mouth daily.    . BD PEN NEEDLE NANO 2ND GEN 32G X 4 MM MISC USE AS DIRECTED FOR INSULIN ADMINISTRATION 100 each 3  . Blood Glucose Monitoring Suppl (ONETOUCH VERIO) w/Device KIT Use to check blood sugar 2 times a day 1 kit 0  . BREO ELLIPTA 200-25 MCG/INH  AEPB Inhale 1 puff into the lungs at bedtime.     . clopidogrel (PLAVIX) 75 MG tablet TAKE 1 TABLET (75 MG TOTAL) BY MOUTH DAILY WITH BREAKFAST. 90 tablet 2  . Coenzyme Q10 300 MG CAPS Take 300 mg by mouth daily.    . Cranberry 300 MG tablet Take 300 mg by mouth every evening.    . fenofibrate (TRICOR) 145 MG tablet TAKE 1 TABLET (145 MG TOTAL) BY MOUTH EVERY EVENING. 90 tablet 1  . glipiZIDE (GLUCOTROL) 5 MG tablet Take 0.5-1 tablets (2.5-5 mg total) by mouth daily before supper. 90 tablet 3  . Glucosamine-Chondroit-Vit C-Mn (GLUCOSAMINE CHONDR 1500 COMPLX PO) Take 1 tablet by mouth daily.    Marland Kitchen glucose blood (ONETOUCH VERIO) test strip Use to check blood sugar 2 times a day 200 each 12  . icosapent Ethyl (VASCEPA) 1 g capsule Take 2 capsules (2 g total) by mouth 2 (two) times daily. 360 capsule 3  . insulin detemir (LEVEMIR FLEXTOUCH) 100 UNIT/ML FlexPen Inject 45 Units into the skin at bedtime. 45 mL 3  . isosorbide mononitrate (IMDUR) 30 MG 24 hr tablet Take 1 tablet (30 mg total) by mouth daily. ** DO  NOT CRUSH ** 90 tablet 3  . Melatonin 3 MG CAPS Take 1.5 mg by mouth at bedtime as needed (sleep).    . metFORMIN (GLUCOPHAGE) 1000 MG tablet Take 1 tablet (1,000 mg total) by mouth 2 (two) times daily with a meal. 180 tablet 3  . Multiple Vitamins-Minerals (CENTRUM SILVER 50+WOMEN PO) Take 1 tablet by mouth daily.    Marland Kitchen PRALUENT 150 MG/ML SOAJ INJECT 150 MG INTO THE SKIN EVERY 14 (FOURTEEN) DAYS. 6 mL 3  . ReliOn Lancets Thin 26G MISC 1 Units by Does not apply route 2 (two) times daily. E 11.9 100 each 12  . rosuvastatin (CRESTOR) 20 MG tablet TAKE 1 TABLET BY MOUTH EVERY DAY (Patient taking differently: Take 20 mg by mouth daily.) 90 tablet 3  . Semaglutide,0.25 or 0.5MG/DOS, (OZEMPIC, 0.25 OR 0.5 MG/DOSE,) 2 MG/1.5ML SOPN Inject 0.5 mg into the skin every Monday. 4.5 mL 3  . sharps container For disposing of insulin pen needles 1 each 2  . valsartan (DIOVAN) 160 MG tablet TAKE 1 TABLET BY MOUTH  EVERY DAY 90 tablet 3  . acetaminophen (TYLENOL) 325 MG tablet Take 325 mg by mouth every 6 (six) hours as needed for moderate pain or headache. (Patient not taking: Reported on 06/22/2020)    . carvedilol (COREG) 6.25 MG tablet TAKE 1 TABLET BY MOUTH TWICE A DAY 180 tablet 1  . fluconazole (DIFLUCAN) 150 MG tablet Take 1 tablet (150 mg total) by mouth daily. Take 1 tablet today. Take 1 tablet in 1 week. (Patient not taking: Reported on 06/22/2020) 2 tablet 0  . HYDROcodone-acetaminophen (NORCO) 5-325 MG tablet Take 1 tablet by mouth every 6 (six) hours as needed for moderate pain. (Patient not taking: Reported on 06/22/2020) 20 tablet 0  . ketoconazole (NIZORAL) 2 % cream Apply 1 application topically daily as needed for irritation. (Patient not taking: Reported on 06/22/2020)    . nitroGLYCERIN (NITROSTAT) 0.4 MG SL tablet PLACE 1 TABLET (0.4 MG TOTAL) UNDER THE TONGUE EVERY 5 (FIVE) MINUTES AS NEEDED FOR CHEST PAIN (UP TO 3 DOSES). (Patient not taking: Reported on 06/22/2020) 75 tablet 1  . ranolazine (RANEXA) 1000 MG SR tablet Take 1 tablet (1,000 mg total) by mouth 2 (two) times daily. (Patient not taking: Reported on 06/22/2020) 180 tablet 3  . sulfamethoxazole-trimethoprim (BACTRIM DS) 800-160 MG tablet Take 1 tablet by mouth 2 (two) times daily. (Patient not taking: Reported on 06/22/2020) 14 tablet 0   No current facility-administered medications on file prior to visit.    ROS as in subjective  Reviewed allergies, medications, past medical, surgical, and social history.     Objective: BP 140/90   Pulse 88   Temp 98 F (36.7 C)   Wt 181 lb 9.6 oz (82.4 kg)   BMI 31.17 kg/m   General appearance: alert, no distress, WD/WN, female Otherwise not examined    Assessment: Encounter Diagnoses  Name Primary?  . Burning with urination Yes  . Dysuria   . Pelvic floor dysfunction   . Mixed stress and urge urinary incontinence   . Loose stools      Plan: Discussed symptoms, diagnosis  or urinary tract infection, possible complications, and usual course of illness.  I reviewed her urine culture from November 2021.  Begin Macrobid as below.  Continue to hydrate well.  She may have an issue with fecal incontinence along with urinary incontinence as well that could be related to pelvic floor dysfunction.  We will refer her to specialty  therapist for pelvic floor dysfunction at [facility].  We discussed avoiding foods that would potentially cause more gas bloating or loose stool.  We discussed hygiene particularly avoiding contamination in the genitourinary area particularly after loose stool  She has incontinence without signs of overactive bladder  We discussed that if she continues to have problems with fecal leakage despite therapy may need to be go back to gastroenterology for consult  Yerlin was seen today for other.  Diagnoses and all orders for this visit:  Burning with urination -     POCT URINALYSIS DIP (CLINITEK)  Dysuria  Pelvic floor dysfunction  Mixed stress and urge urinary incontinence  Loose stools  Other orders -     nitrofurantoin, macrocrystal-monohydrate, (MACROBID) 100 MG capsule; Take 1 capsule (100 mg total) by mouth 2 (two) times daily.    Call or return if worse or not improving in the next 3-4 days.

## 2020-07-03 ENCOUNTER — Telehealth (INDEPENDENT_AMBULATORY_CARE_PROVIDER_SITE_OTHER): Payer: BC Managed Care – PPO | Admitting: Medical

## 2020-07-03 ENCOUNTER — Encounter: Payer: Self-pay | Admitting: Medical

## 2020-07-03 ENCOUNTER — Other Ambulatory Visit: Payer: Self-pay

## 2020-07-03 VITALS — Ht 64.0 in | Wt 179.5 lb

## 2020-07-03 DIAGNOSIS — J988 Other specified respiratory disorders: Secondary | ICD-10-CM | POA: Diagnosis not present

## 2020-07-03 MED ORDER — AZITHROMYCIN 250 MG PO TABS
ORAL_TABLET | ORAL | 0 refills | Status: DC
Start: 2020-07-03 — End: 2020-08-20

## 2020-07-03 NOTE — Progress Notes (Signed)
Subjective:     Patient ID: Haley George, female   DOB: 1956-01-07, 65 y.o.   MRN: [id number]  This visit type was conducted due to national recommendations for restrictions regarding the COVID-19 Pandemic (e.g. social distancing) in an effort to limit this patient's exposure and mitigate transmission in our community.  Due to their co-morbid illnesses, this patient is at least at moderate risk for complications without adequate follow up.  This format is felt to be most appropriate for this patient at this time.    Documentation for virtual audio and video telecommunications through [place] encounter:  The patient was located at home. The provider was located in the office. The patient did consent to this visit and is aware of possible charges through their insurance for this visit.  The other persons participating in this telemedicine service were none. Time spent on call was 20 minutes and in review of previous records 20 minutes total.  This virtual service is not related to other E/M service within previous 7 days.   HPI Chief Complaint  Patient presents with  . Sinus Problem    Covid negative. Head pressure, watery eyes, sinus drainage, scratchy throat   Virtual consult for possible sinus infection.   She notes less than a week hx/o nasal drainage, scratchy throat, sinus pressure, whole head pressure, clear drainage.  No fever, no chills, no body aches, no NVD, no change in smell or taste. No sick contact.  No hx/o significant spring time allergies.   Uses Breo daily, albuterol prn. No SOB, no wheezing, no recent problems with asthma.   Using mucinex DM.  No other aggravating or relieving factors. No other complaint.  Past Medical History:  Diagnosis Date  . Asthma   . Cancer ([place])    basal cell chest  . Complication of anesthesia   . Coronary artery disease   . Diabetes mellitus without complication ([place])    type 2  . Heart murmur   . Hepatitis    auto- immune  .  History of kidney stones   . History of nuclear stress test 05/21/2010   dipyridamole; normal perfusion, preserved LV systolic EF of 40%  . Hyperlipidemia   . Hypertension   . Liver failure ([place])    h/o autoimmune hepatitis   . Myocardial infarction ([place])   . Neuropathy    resolved with back surgery; tingling and burning left leg to foot  . Obesity   . PONV (postoperative nausea and vomiting)   . Psoriasis   . Tubular adenoma of colon    colon path 01/09/2017     Review of Systems As in subjective    Objective:   Physical Exam Due to coronavirus pandemic stay at home measures, patient visit was virtual and they were not examined in person.   Gen: nad Answer questions in complete sentences without labored breathing      Assessment:     Encounter Diagnosis  Name Primary?  Marland Kitchen Respiratory tract infection Yes       Plan:     Symptoms suggestive of viral URI.  Discussed supportive care, hydration, mucinex DM continued a few days, add benadryl a few days at bedtime, consider nasal saline flush.   If worse sinus pressure and persistent symptoms in the next 3-4  days, begin zpak.  otherwise discussed usual time frame to see viral URI symptoms resolve  Haley George was seen today for sinus problem.  Diagnoses and all orders for this visit:  Respiratory tract  infection  Other orders -     azithromycin (ZITHROMAX) 250 MG tablet; 2 tablets day 1, then 1 tablet days 2-4  f/u prn

## 2020-07-28 ENCOUNTER — Ambulatory Visit (INDEPENDENT_AMBULATORY_CARE_PROVIDER_SITE_OTHER): Payer: BC Managed Care – PPO | Admitting: Internal Medicine

## 2020-07-28 ENCOUNTER — Encounter: Payer: Self-pay | Admitting: Internal Medicine

## 2020-07-28 ENCOUNTER — Other Ambulatory Visit: Payer: Self-pay

## 2020-07-28 VITALS — BP 138/90 | HR 94 | Ht 64.0 in | Wt 181.2 lb

## 2020-07-28 DIAGNOSIS — E785 Hyperlipidemia, unspecified: Secondary | ICD-10-CM

## 2020-07-28 DIAGNOSIS — E6609 Other obesity due to excess calories: Secondary | ICD-10-CM

## 2020-07-28 DIAGNOSIS — Z794 Long term (current) use of insulin: Secondary | ICD-10-CM | POA: Diagnosis not present

## 2020-07-28 DIAGNOSIS — E1159 Type 2 diabetes mellitus with other circulatory complications: Secondary | ICD-10-CM

## 2020-07-28 DIAGNOSIS — Z6834 Body mass index (BMI) 34.0-34.9, adult: Secondary | ICD-10-CM

## 2020-07-28 DIAGNOSIS — E1169 Type 2 diabetes mellitus with other specified complication: Secondary | ICD-10-CM

## 2020-07-28 LAB — POCT GLYCOSYLATED HEMOGLOBIN (HGB A1C): Hemoglobin A1C: 7 % — AB (ref 4.0–5.6)

## 2020-07-28 MED ORDER — OZEMPIC (1 MG/DOSE) 4 MG/3ML ~~LOC~~ SOPN: 1.0000 mg | PEN_INJECTOR | SUBCUTANEOUS | 3 refills | Status: DC

## 2020-07-28 NOTE — Patient Instructions (Signed)
Please continue: - Metformin 1000 mg 2x a day with meals - Glipizide 2.5 to 5 mg before a large meal - Levemir 50 units at bedtime  Please increase: - Ozempic 1 mg weekly  Please come back for a follow-up appointment in 4 months.

## 2020-07-28 NOTE — Progress Notes (Signed)
Patient ID: Haley George, female   DOB: [birth year], 65 y.o.   MRN: [id number]   This visit occurred during the SARS-CoV-2 public health emergency.  Safety protocols were in place, including screening questions prior to the visit, additional usage of staff PPE, and extensive cleaning of exam room while observing appropriate contact time as indicated for disinfecting solutions.   HPI: Haley George is a 65 y.o.-year-old female, initially referred by her PCP, Dr. Redmond School, returning for follow-up for DM2, dx initially in 1997 2/2 liver failure, then GDM, then re-occuring in 2007, insulin-dependent, uncontrolled, with long-term complications (CAD-s/p AMI, s/p stents, aortic valve stenosis; CKD).  Last visit 4 mo ago.  Interim history: Before last visit, she had increased stress: She had an NSTEMI on 12/12/19 and her husband died very unexpectedly at the end of 11/2019.  She was very shaken by these events and was not eating well after getting medications.  Despite this, her HbA1c was better at last check.  However, since then, sugars started to increase.  She still had erratic meals and snacks.  Reviewed HbA1c levels: Lab Results  Component Value Date   HGBA1C 6.2 (A) 01/28/2020   HGBA1C 6.6 (H) 12/02/2019   HGBA1C 6.4 (H) 10/22/2019   HGBA1C 6.3 (A) 01/29/2019   HGBA1C 9.2 (A) 08/30/2018   HGBA1C 8.8 (A) 03/27/2018   HGBA1C 9.2 (A) 12/19/2017   HGBA1C 7.3 03/13/2017   HGBA1C 10.5 (H) 10/18/2016   HGBA1C 7.1 07/12/2016  05/01/2019: HbA1c 6.2% She was on steroid injections in 2019.  Pt is on a regimen of: - Metformin 1000 mg 2x a day, with meals - Glipizide 2.5-5 mg before a meal - added 01/2019 -may forget it - Levemir 140 (70 units x2) units at bedtime >> 70 units >> 50 >> 50 >> 45 >> 50 units at bedtime   - Ozempic 0.5 mg weekly-added 09/2018 She was on Actos before.  Pt checks her sugars 2x a day: - am: 91-126 >> 79, 81-101 >> 83-96, 132, 137 if forgets insulin >> 131-162, 180 - 2h  after b'fast: n/c >> 129-148 >> n/c >> 163-188, 218 - before lunch: n/c >> 160, 183 - 2h after lunch:  138-148 >> 102, 149, 183 (no glipizide) >> 190 - before dinner: n/c >> 142-173 - 2h after dinner:  132-161, 168 >> 149, 170, 182 (no glipizide) >> 194-219 - bedtime: n/c >> 99-137, 152 >> 146-192 - nighttime: n/c Lowest sugar was 40 - 2012 >> ...  79 >> 89 >> 131; she has hypoglycemia awareness in the 70s. Highest sugar was 368 ... >> 183 >> 219.  Glucometer: ReliOn  Pt's meals are: - Breakfast: if not skipping - egg + whole wheat toast; cheerios + milk - Lunch: chicken salad, apple, veggies, trisquits  - Dinner: meat + 2 veggies +/- rolls  Before last visit, she was on [place] for autoimmune hepatitis.  She could not tolerate it and had to stop.  She lost 24 pounds on it.  -+ CKD, last BUN/creatinine:  Lab Results  Component Value Date   BUN 18 02/19/2020   BUN 21 12/03/2019   CREATININE 1.13 (H) 02/19/2020   CREATININE 1.43 (H) 12/03/2019  On [place] 160.  -+ HL; last set of lipids: Lab Results  Component Value Date   CHOL 134 12/02/2019   HDL 20 (L) 12/02/2019   LDLCALC UNABLE TO CALCULATE IF TRIGLYCERIDE OVER 400 mg/dL 12/02/2019   LDLDIRECT 93 11/15/2012   TRIG 534 (H) 12/02/2019  CHOLHDL 6.7 12/02/2019  On Crestor 20, fenofibrate, Vascepa.  Also on Praluent started 12/2018.  - last eye exam was in 03/2020: No DR reportedly. Dr. Groat.   - no numbness and tingling in her feet.  Pt has FH of DM in M, B, M uncle, MGM.  She was admitted for chest pain 10/2018 after an episode of dehydration and had heart catheterization at that time >> has another stent and the previous stent was recanalized.  She has a history of autoimmune hepatitis. Also, psoriasis, diagnosed as a child.  She lost a signif. amount of weight on [place] in 2018.    ROS: Constitutional: + weight gain/no weight loss, no fatigue, no subjective hyperthermia, no subjective hypothermia Eyes: no  blurry vision, no xerophthalmia ENT: no sore throat, no nodules palpated in neck, no dysphagia, no odynophagia, no hoarseness Cardiovascular: no CP/no SOB/no palpitations/no leg swelling Respiratory: no cough/no SOB/no wheezing Gastrointestinal: no N/no V/no D/no C/no acid reflux Musculoskeletal: no muscle aches/no joint aches Skin: no rashes, no hair loss Neurological: no tremors/no numbness/no tingling/no dizziness  I reviewed pt's medications, allergies, PMH, social hx, family hx, and changes were documented in the history of present illness. Otherwise, unchanged from my initial visit note.  Past Medical History:  Diagnosis Date  . Asthma   . Cancer (HCC)    basal cell chest  . Complication of anesthesia   . Coronary artery disease   . Diabetes mellitus without complication (HCC)    type 2  . Heart murmur   . Hepatitis    auto- immune  . History of kidney stones   . History of nuclear stress test 05/21/2010   dipyridamole; normal perfusion, preserved LV systolic EF of 66%  . Hyperlipidemia   . Hypertension   . Liver failure (HCC)    h/o autoimmune hepatitis   . Myocardial infarction (HCC)   . Neuropathy    resolved with back surgery; tingling and burning left leg to foot  . Obesity   . PONV (postoperative nausea and vomiting)   . Psoriasis   . Tubular adenoma of colon    colon path 01/09/2017   Past Surgical History:  Procedure Laterality Date  . Carotid Doppler  10/2008   R & L ICAs 0-49% diameter reduction   . COLONOSCOPY    . CORONARY ANGIOPLASTY WITH STENT PLACEMENT  09/2008   2.25x12mm Cypher DES to in-stent restenosis of prox LAD; Mini-Vision stent x2 2.0x12mm to area distal of initial LAD stent; 3 2.5x15mm Taxus stents prox to Cypher stent in circumflex  . CORONARY ANGIOPLASTY WITH STENT PLACEMENT  09/2005   Cypher DES 3.0x22mm to distal RCA; 2.25x12mm Mini0Vision stent to mid LAD; 2.5x18mm Cypher stent to circumflex  . CORONARY STENT INTERVENTION N/A 11/13/2018    Procedure: CORONARY STENT INTERVENTION;  Surgeon: Varanasi, Jayadeep S, MD;  Location: [facility];  Service: Cardiovascular;  Laterality: N/A;  . CORONARY STENT INTERVENTION N/A 12/02/2019   Procedure: CORONARY STENT INTERVENTION;  Surgeon: End, Christopher, MD;  Location: [facility];  Service: Cardiovascular;  Laterality: N/A;  . LEFT HEART CATH AND CORONARY ANGIOGRAPHY N/A 11/13/2018   Procedure: LEFT HEART CATH AND CORONARY ANGIOGRAPHY;  Surgeon: Varanasi, Jayadeep S, MD;  Location: [facility];  Service: Cardiovascular;  Laterality: N/A;  . LEFT HEART CATH AND CORONARY ANGIOGRAPHY N/A 12/02/2019   Procedure: LEFT HEART CATH AND CORONARY ANGIOGRAPHY;  Surgeon: End, Christopher, MD;  Location: [facility];  Service: Cardiovascular;    Laterality: N/A;  . LUMBAR LAMINECTOMY/DECOMPRESSION MICRODISCECTOMY Left 10/18/2016   Procedure: Left Lumbar Four-Five Microdiscectomy;  Surgeon: Erline Levine, MD;  Location: [facility];  Service: Neurosurgery;  Laterality: Left;  Left L4-5 Microdiscectomy  . ROTATOR CUFF REPAIR Left   . SHOULDER ARTHROSCOPY W/ ROTATOR CUFF REPAIR Right   . TRANSTHORACIC ECHOCARDIOGRAM  04/2010   EF=>55%, borderline conc LVH; trace MR & TR; AV mildly sclerotic; aortic root sclerosis/calcif  . TRIGGER FINGER RELEASE Left 02/19/2020   Procedure: RELEASE TRIGGER FINGER/A-1 PULLEY;  Surgeon: Earlie Server, MD;  Location: [facility];  Service: Orthopedics;  Laterality: Left;  . ureteral stents     Social History   Socioeconomic History  . Marital status: Single    Spouse name: Not on file  . Number of children: 1  . Years of education: Not on file  . 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 Financial resource strain: Not on file  . Food insecurity    Worry: Not on file    Inability: Not on file  . Transportation needs    Medical: Not on  file    Non-medical: Not on file  Tobacco Use  . Smoking status: Never Smoker  . Smokeless tobacco: Never Used  Substance and Sexual Activity  . Alcohol use: No  . Drug use: No  . Sexual activity: Not Currently  Lifestyle  . Physical activity    Days per week: Not on file    Minutes per session: Not on file  . Stress: Not on file  Relationships  . Social Herbalist on phone: Not on file    Gets together: Not on file    Attends religious service: Not on file    Active member of club or organization: Not on file    Attends meetings of clubs or organizations: Not on file    Relationship status: Not on file  . Intimate partner violence    Fear of current or ex partner: Not on file    Emotionally abused: Not on file    Physically abused: Not on file    Forced sexual activity: Not on file  Other Topics Concern  . Not on file  Social History Narrative  . Not on file   Current Outpatient Medications on File Prior to Visit  Medication Sig Dispense Refill  . acetaminophen (TYLENOL) 325 MG tablet Take 325 mg by mouth every 6 (six) hours as needed for moderate pain or headache. (Patient not taking: Reported on 06/22/2020)    . Ascorbic Acid (VITAMIN C) 500 MG CAPS Take 500 mg by mouth daily.    Marland Kitchen aspirin EC 81 MG tablet Take 1 tablet (81 mg total) by mouth daily.    Marland Kitchen azithromycin (ZITHROMAX) 250 MG tablet 2 tablets day 1, then 1 tablet days 2-4 6 tablet 0  . BD PEN NEEDLE NANO 2ND GEN 32G X 4 MM MISC USE AS DIRECTED FOR INSULIN ADMINISTRATION 100 each 3  . Blood Glucose Monitoring Suppl (ONETOUCH VERIO) w/Device KIT Use to check blood sugar 2 times a day 1 kit 0  . BREO ELLIPTA 200-25 MCG/INH AEPB Inhale 1 puff into the lungs at bedtime.     . carvedilol (COREG) 6.25 MG tablet TAKE 1 TABLET BY MOUTH TWICE A DAY 180 tablet 1  . clopidogrel (PLAVIX) 75 MG tablet TAKE 1 TABLET (75 MG TOTAL) BY MOUTH  DAILY WITH BREAKFAST. 90 tablet 2  . Coenzyme Q10 300 MG CAPS Take 300 mg by  mouth daily.    . Cranberry 300 MG tablet Take 300 mg by mouth every evening.    . fenofibrate (TRICOR) 145 MG tablet TAKE 1 TABLET (145 MG TOTAL) BY MOUTH EVERY EVENING. 90 tablet 1  . fluconazole (DIFLUCAN) 150 MG tablet Take 1 tablet (150 mg total) by mouth daily. Take 1 tablet today. Take 1 tablet in 1 week. (Patient not taking: Reported on 06/22/2020) 2 tablet 0  . glipiZIDE (GLUCOTROL) 5 MG tablet Take 0.5-1 tablets (2.5-5 mg total) by mouth daily before supper. 90 tablet 3  . Glucosamine-Chondroit-Vit C-Mn (GLUCOSAMINE CHONDR 1500 COMPLX PO) Take 1 tablet by mouth daily.    . glucose blood (ONETOUCH VERIO) test strip Use to check blood sugar 2 times a day 200 each 12  . HYDROcodone-acetaminophen (NORCO) 5-325 MG tablet Take 1 tablet by mouth every 6 (six) hours as needed for moderate pain. (Patient not taking: Reported on 06/22/2020) 20 tablet 0  . icosapent Ethyl (VASCEPA) 1 g capsule Take 2 capsules (2 g total) by mouth 2 (two) times daily. 360 capsule 3  . insulin detemir (LEVEMIR FLEXTOUCH) 100 UNIT/ML FlexPen Inject 45 Units into the skin at bedtime. 45 mL 3  . isosorbide mononitrate (IMDUR) 30 MG 24 hr tablet Take 1 tablet (30 mg total) by mouth daily. ** DO NOT CRUSH ** 90 tablet 3  . ketoconazole (NIZORAL) 2 % cream Apply 1 application topically daily as needed for irritation. (Patient not taking: Reported on 06/22/2020)    . Melatonin 3 MG CAPS Take 1.5 mg by mouth at bedtime as needed (sleep).    . metFORMIN (GLUCOPHAGE) 1000 MG tablet Take 1 tablet (1,000 mg total) by mouth 2 (two) times daily with a meal. 180 tablet 3  . Multiple Vitamins-Minerals (CENTRUM SILVER 50+WOMEN PO) Take 1 tablet by mouth daily.    . nitrofurantoin, macrocrystal-monohydrate, (MACROBID) 100 MG capsule Take 1 capsule (100 mg total) by mouth 2 (two) times daily. 14 capsule 0  . nitroGLYCERIN (NITROSTAT) 0.4 MG SL tablet PLACE 1 TABLET (0.4 MG TOTAL) UNDER THE TONGUE EVERY 5 (FIVE) MINUTES AS NEEDED FOR CHEST  PAIN (UP TO 3 DOSES). (Patient not taking: Reported on 06/22/2020) 75 tablet 1  . PRALUENT 150 MG/ML SOAJ INJECT 150 MG INTO THE SKIN EVERY 14 (FOURTEEN) DAYS. 6 mL 3  . ranolazine (RANEXA) 1000 MG SR tablet Take 1 tablet (1,000 mg total) by mouth 2 (two) times daily. (Patient not taking: Reported on 06/22/2020) 180 tablet 3  . ReliOn Lancets Thin 26G MISC 1 Units by Does not apply route 2 (two) times daily. E 11.9 100 each 12  . rosuvastatin (CRESTOR) 20 MG tablet TAKE 1 TABLET BY MOUTH EVERY DAY (Patient taking differently: Take 20 mg by mouth daily.) 90 tablet 3  . Semaglutide,0.25 or 0.5MG/DOS, (OZEMPIC, 0.25 OR 0.5 MG/DOSE,) 2 MG/1.5ML SOPN Inject 0.5 mg into the skin every Monday. 4.5 mL 3  . sharps container For disposing of insulin pen needles 1 each 2  . sulfamethoxazole-trimethoprim (BACTRIM DS) 800-160 MG tablet Take 1 tablet by mouth 2 (two) times daily. (Patient not taking: Reported on 06/22/2020) 14 tablet 0  . valsartan (DIOVAN) 160 MG tablet TAKE 1 TABLET BY MOUTH EVERY DAY 90 tablet 3   No current facility-administered medications on file prior to visit.   Allergies  Allergen Reactions  . Penicillins Anaphylaxis and Hives       PATIENT HAD A PCN REACTION WITH IMMEDIATE RASH, FACIAL/TONGUE/THROAT SWELLING, SOB, OR LIGHTHEADEDNESS WITH HYPOTENSION:  #  #  #  YES  #  #  #   Has patient had a PCN reaction causing severe rash involving mucus membranes or skin necrosis:Unknown Has patient had a PCN reaction that required hospitalization:No Has patient had a PCN reaction occurring within the last 10 years:No If all of the above answers are "NO", then may proceed with Cephalosporin use.   Marland Kitchen Coconut Flavor     Mouth breaks out   . Other Other (See Comments)    Walnuts cause mouth to break out   Family History  Problem Relation Age of Onset  . CAD Mother   . CAD Father   . Diabetes Brother    PE: BP 138/90 (BP Location: Left Arm, Patient Position: Sitting, Cuff Size: Normal)    Pulse 94   Ht 5' 4" (1.626 m)   Wt 181 lb 3.2 oz (82.2 kg)   SpO2 98%   BMI 31.10 kg/m  Wt Readings from Last 3 Encounters:  07/28/20 181 lb 3.2 oz (82.2 kg)  07/03/20 179 lb 8 oz (81.4 kg)  06/22/20 181 lb 9.6 oz (82.4 kg)   Constitutional: overweight, in NAD Eyes: PERRLA, EOMI, no exophthalmos ENT: moist mucous membranes, no thyromegaly, no cervical lymphadenopathy Cardiovascular: tachycardia, RR, No RG, +2/6 SEM Respiratory: CTA B Gastrointestinal: abdomen soft, NT, ND, BS+ Musculoskeletal: no deformities, strength intact in all 4 Skin: moist, warm, no rashes Neurological: no tremor with outstretched hands, DTR normal in all 4  ASSESSMENT: 1. DM2, insulin-dependent, uncontrolled, with long-term complications - CAD-s/p AMI, s/p stents, aortic valve stenosis - CKD  2. HL  3.  Obesity class I  PLAN:  1. Patient with longstanding, uncontrolled, type 2 diabetes, on oral antidiabetic regimen with Metformin and sulfonylurea and also weekly GLP-1 receptor agonist and daily long-acting insulin with improved blood sugars in the last 2 years.  Before last visit, she had an NSTEMI.  She recovered well afterwards.  In the past she wanted to avoid an SGLT2 inhibitor due to possible increased urination.  She also had a UTI recently (05/2020) and yeast on wet prep (02/2020). At last visit, sugars were within target range with 3 exceptions when she forgot either insulin or glipizide.  She is taking glipizide only with a larger meal.  HbA1c was 6.2%. -At this visit, sugars are higher due to erratic meals and increased stress.  She also occasionally forgetting glipizide before dinner. We discussed about moving dinners earlier and not snack after dinner, but we will also go ahead and increase her Ozempic dose for now.  She is tolerating this well. -We discussed that if sugars do not improve afterwards, we can add Glipizide XL in am, mealtime insulin or, maybe, an SGLT2 inhibitor but we will have to  be very careful if we add this (see above). - I suggested to:  Patient Instructions  Please continue: - Metformin 1000 mg 2x a day with meals - Glipizide 2.5 to 5 mg before a large meal - Levemir 50 units at bedtime  Please increase: - Ozempic 1 mg weekly  Please come back for a follow-up appointment in 4 months.  - we checked her HbA1c: 7.0% (higher) - advised to check sugars at different times of the day - 2x a day, rotating check times - advised for yearly eye exams >> she is UTD - return to clinic in 4 months  2. HL -  Reviewed latest lipid panel from 11/2019: Very high triglycerides, low HDL and LDL above our goal of less than 70: Lab Results  Component Value Date   CHOL 134 12/02/2019   HDL 20 (L) 12/02/2019   LDLCALC UNABLE TO CALCULATE IF TRIGLYCERIDE OVER 400 mg/dL 12/02/2019   LDLDIRECT 93 11/15/2012   TRIG 534 (H) 12/02/2019   CHOLHDL 6.7 12/02/2019  -She continues on Crestor 20, fenofibrate 145, Vascepa 2 g twice daily, and Praluent 150 mg every 2 weeks without side effects  3.  Obesity class I -We will continue the GLP-1 receptor agonist which should also help with weight loss.  At this visit, will increase the dose. -At last visit, she lost a net 8 pounds since the previous visit due to her husband's death.  She gained 9 pounds since then.  [name], MD PhD [facility]

## 2020-08-13 ENCOUNTER — Other Ambulatory Visit: Payer: Self-pay

## 2020-08-13 ENCOUNTER — Encounter: Payer: Self-pay | Admitting: Family Medicine

## 2020-08-13 ENCOUNTER — Ambulatory Visit: Payer: BC Managed Care – PPO | Admitting: Family Medicine

## 2020-08-13 VITALS — BP 156/86 | HR 83 | Temp 98.2°F | Ht 65.25 in | Wt 183.0 lb

## 2020-08-13 DIAGNOSIS — R399 Unspecified symptoms and signs involving the genitourinary system: Secondary | ICD-10-CM | POA: Diagnosis not present

## 2020-08-13 DIAGNOSIS — Z23 Encounter for immunization: Secondary | ICD-10-CM | POA: Diagnosis not present

## 2020-08-13 LAB — POCT URINALYSIS DIP (PROADVANTAGE DEVICE)
Bilirubin, UA: NEGATIVE
Blood, UA: NEGATIVE
Glucose, UA: NEGATIVE mg/dL
Ketones, POC UA: NEGATIVE mg/dL
Leukocytes, UA: NEGATIVE
Nitrite, UA: NEGATIVE
Protein Ur, POC: NEGATIVE mg/dL
Specific Gravity, Urine: 1.025
Urobilinogen, Ur: 0.2
pH, UA: 5.5 (ref 5.0–8.0)

## 2020-08-13 NOTE — Progress Notes (Signed)
   Subjective:    Patient ID: Haley George, female    DOB: 07/24/1955, 65 y.o.   MRN: [id number]  HPI She complains of a several day history of dysuria and frequency however she states that this morning the symptoms went away.  No fever or chills or abdominal pain.  She also would like another COVID-vaccine. She also states that she has been unable to give blood due to her hemoglobin being at the low end of normal at [employer].  Review of Systems     Objective:   Physical Exam  Alert and in no distress.  Urine dipstick was negative. Her last hemoglobin was 12.0.     Assessment & Plan:  UTI symptoms - Plan: POCT Urinalysis DIP (Proadvantage Device)  Immunization, viral disease - Plan: PFIZER Comirnaty(GRAY TOP)COVID-19 Vaccine Recommend continued conservative care concerning her urinary symptoms. Discussed the hemoglobin with her and recommend that she make sure she take a good multivitamin with iron.

## 2020-08-17 ENCOUNTER — Other Ambulatory Visit: Payer: Self-pay | Admitting: Cardiovascular Disease

## 2020-08-17 DIAGNOSIS — L409 Psoriasis, unspecified: Secondary | ICD-10-CM

## 2020-08-17 DIAGNOSIS — E1159 Type 2 diabetes mellitus with other circulatory complications: Secondary | ICD-10-CM

## 2020-08-20 ENCOUNTER — Other Ambulatory Visit: Payer: Self-pay

## 2020-08-20 ENCOUNTER — Telehealth: Payer: Self-pay

## 2020-08-20 ENCOUNTER — Ambulatory Visit: Payer: BC Managed Care – PPO | Admitting: Cardiovascular Disease

## 2020-08-20 ENCOUNTER — Encounter: Payer: Self-pay | Admitting: Cardiovascular Disease

## 2020-08-20 VITALS — BP 158/78 | HR 90 | Ht 65.25 in | Wt 180.0 lb

## 2020-08-20 DIAGNOSIS — E1165 Type 2 diabetes mellitus with hyperglycemia: Secondary | ICD-10-CM

## 2020-08-20 DIAGNOSIS — I10 Essential (primary) hypertension: Secondary | ICD-10-CM

## 2020-08-20 DIAGNOSIS — E118 Type 2 diabetes mellitus with unspecified complications: Secondary | ICD-10-CM | POA: Diagnosis not present

## 2020-08-20 DIAGNOSIS — J4530 Mild persistent asthma, uncomplicated: Secondary | ICD-10-CM

## 2020-08-20 DIAGNOSIS — I350 Nonrheumatic aortic (valve) stenosis: Secondary | ICD-10-CM

## 2020-08-20 DIAGNOSIS — E782 Mixed hyperlipidemia: Secondary | ICD-10-CM

## 2020-08-20 DIAGNOSIS — IMO0002 Reserved for concepts with insufficient information to code with codable children: Secondary | ICD-10-CM

## 2020-08-20 DIAGNOSIS — E663 Overweight: Secondary | ICD-10-CM

## 2020-08-20 DIAGNOSIS — I25118 Atherosclerotic heart disease of native coronary artery with other forms of angina pectoris: Secondary | ICD-10-CM | POA: Diagnosis not present

## 2020-08-20 MED ORDER — PRALUENT 150 MG/ML ~~LOC~~ SOAJ
150.0000 mg | SUBCUTANEOUS | 3 refills | Status: DC
Start: 2020-08-20 — End: 2021-09-21

## 2020-08-20 MED ORDER — ISOSORBIDE MONONITRATE ER 60 MG PO TB24: 60.0000 mg | ORAL_TABLET | Freq: Every day | ORAL | 3 refills | Status: DC

## 2020-08-20 MED ORDER — SULFAMETHOXAZOLE-TRIMETHOPRIM 800-160 MG PO TABS: 1.0000 | ORAL_TABLET | Freq: Two times a day (BID) | ORAL | 0 refills | Status: DC

## 2020-08-20 NOTE — Telephone Encounter (Signed)
Pt. Called stating that she saw you last week for a UTI but nothing showed on the urine culture, but she said now her symptoms are way worse. She has burning frequency, and very cloudy urine. She wanted to know if you could now call in something for her for the UTI or what did she need to do now.

## 2020-08-20 NOTE — Telephone Encounter (Signed)
I called an antibiotic in 

## 2020-08-20 NOTE — Patient Instructions (Signed)
Medication Instructions:  INCREASE the Isosorbide to 60 mg once daily  *If you need a refill on your cardiac medications before your next appointment, please call your pharmacy*   Lab Work: Your provider would like for you to have the following labs today: Lipid, CMET and A1C  If you have labs (blood work) drawn today and your tests are completely normal, you will receive your results only by: Marland Kitchen MyChart Message (if you have MyChart) OR . A paper copy in the mail If you have any lab test that is abnormal or we need to change your treatment, we will call you to review the results.   Testing/Procedures: None ordered   Follow-Up: At [facility], you and your health needs are our priority.  As part of our continuing mission to provide you with exceptional heart care, we have created designated Provider Care Teams.  These Care Teams include your primary Cardiologist (physician) and Advanced Practice Providers (APPs -  Physician Assistants and Nurse Practitioners) who all work together to provide you with the care you need, when you need it.  We recommend signing up for the patient portal called "MyChart".  Sign up information is provided on this After Visit Summary.  MyChart is used to connect with patients for Virtual Visits (Telemedicine).  Patients are able to view lab/test results, encounter notes, upcoming appointments, etc.  Non-urgent messages can be sent to your provider as well.   To learn more about what you can do with MyChart, go to [email or web address].    Your next appointment:   Keep your follow up on 5/27 at 9 am

## 2020-08-20 NOTE — Telephone Encounter (Signed)
Forwarding message was told Dr. Redmond School was checking his messages but I haven't heard anything back yet.

## 2020-08-20 NOTE — Telephone Encounter (Signed)
Pt. Aware.

## 2020-08-21 LAB — COMPREHENSIVE METABOLIC PANEL
ALT: 17 IU/L (ref 0–32)
AST: 24 IU/L (ref 0–40)
Albumin/Globulin Ratio: 1.3 (ref 1.2–2.2)
Albumin: 4.4 g/dL (ref 3.8–4.8)
Alkaline Phosphatase: 40 IU/L — ABNORMAL LOW (ref 44–121)
BUN/Creatinine Ratio: 18 (ref 12–28)
BUN: 24 mg/dL (ref 8–27)
Bilirubin Total: 0.3 mg/dL (ref 0.0–1.2)
CO2: 23 mmol/L (ref 20–29)
Calcium: 9.9 mg/dL (ref 8.7–10.3)
Chloride: 103 mmol/L (ref 96–106)
Creatinine, Ser: 1.36 mg/dL — ABNORMAL HIGH (ref 0.57–1.00)
Globulin, Total: 3.3 g/dL (ref 1.5–4.5)
Glucose: 127 mg/dL — ABNORMAL HIGH (ref 65–99)
Potassium: 4.9 mmol/L (ref 3.5–5.2)
Sodium: 140 mmol/L (ref 134–144)
Total Protein: 7.7 g/dL (ref 6.0–8.5)
eGFR: 44 mL/min/{1.73_m2} — ABNORMAL LOW (ref >59)

## 2020-08-21 LAB — LIPID PANEL
Chol/HDL Ratio: 3.8 ratio (ref 0.0–4.4)
Cholesterol, Total: 83 mg/dL — ABNORMAL LOW (ref 100–199)
HDL: 22 mg/dL — ABNORMAL LOW (ref >39)
LDL Chol Calc (NIH): 21 mg/dL (ref 0–99)
Triglycerides: 270 mg/dL — ABNORMAL HIGH (ref 0–149)
VLDL Cholesterol Cal: 40 mg/dL (ref 5–40)

## 2020-08-21 LAB — HEMOGLOBIN A1C
Est. average glucose Bld gHb Est-mCnc: 166 mg/dL
Hgb A1c MFr Bld: 7.4 % — ABNORMAL HIGH (ref 4.8–5.6)

## 2020-08-23 ENCOUNTER — Encounter: Payer: Self-pay | Admitting: Cardiovascular Disease

## 2020-08-23 NOTE — Progress Notes (Signed)
Patient ID: Haley George, female   DOB: [birth year], 65 y.o.   MRN: [id number]    Cardiology Office Note    Date:  08/23/2020   ID:  Haley George, DOB [birth year], MRN [id number]  PCP:  Denita Lung, MD  Cardiologist:   Sanda Klein, MD   Chief Complaint  Patient presents with  . Chest Pain    History of Present Illness:  Haley George is a 65 y.o. female with diabetes mellitus type II, severe mixed hyperlipidemia, previous percutaneous revascularization procedures performed in all 3 major coronary arteries and restenosis requiring repeat PCI, mild calcific aortic stenosis.  Most recent intervention was placement of 2 drug-eluting stents in the mid-distal left circumflex coronary artery covering a de novo mid vessel lesion and a distal in-stent restenosis lesion in the setting of small NSTEMI (peak troponin 1818) on 12/02/2019.  [name] has not been feeling well over the last 4 weeks or so.  She has noticed dyspnea when climbing the stairs.  On the other hand she was able to mow her lawn with a push mower for about an hour this week with minimal chest tightness and no significant shortness of breath.  Driving to the office today she had chest tightness reminiscent of her previous angina pectoris. Roughly 8 months since her last percutaneous revascularization procedure with placement of 2 new drug-eluting stents.  She does have a history of diabetes mellitus and in-stent restenosis.  Although we called in a prescription for ranolazine, she has not started to take it yet.  Some of her symptoms have been reminiscent of possible coronary vasospasm, since they occur at rest and resolved with nitroglycerin.  She continues to struggle with the loss of her lifetime soulmate and husband, [name].  She was quite teary-eyed again today.  Their daughter [name], has still not fully made peace with his passing.  She has not had problems with orthopnea, PND, lower extremity edema, claudication, focal  neurological complaints, dizziness, palpitations or syncope.  There's been a slight deterioration in glycemic control, but her hemoglobin A1c was still acceptable at 7.0% on 07/28/2020.  Last lipid profile showed an HDL of 20 LDL that was only 8.  We performed repeat labs today.  Her lipid profile continues to show an excellent LDL and a low HDL.  Triglycerides have dropped in half, but the hemoglobin A1c is a little higher at 7.4%  She is really on excellent, state-of-the-art regimen for her metabolic problems with [name], Ozempic, Praluent, rosuvastatin, Vascepa, as well as has glipizide and fenofibrate.  She developed unstable angina and she underwent cardiac catheterization on December 02, 2019.  The culprit lesion was found to be a 95% in-stent restenosis in the mid left circumflex coronary artery, as well as a de novo 70% mid left circumflex stenosis at the OM2 bifurcation.  This was treated by placement of overlapping 2.5x12 and 2.25x20 drug-eluting Synergy stents (Dr. Saunders Revel).  Also noted was a mild-moderate mid to distal LAD in-stent restenosis and an unchanged 80% RPDA lesion.  She had complete relief of her angina following this intervention.      Previous coronary interventions 2007  - mid LAD 2.2512 mini vision - left circumflex 2.518 Cypher  - RCA 3.023 Cypher  2010  - mid LAD 2.2512 Taxus overlapping 2.012 mini vision, overlapping another 2.012 mini vision -  left circumflex 2.516 Taxus upstream of previous stent.  July 2020  - mid LAD Synergy 2.5 x12 , overlapping the proximal area of  restenosis in the mid LAD (short area of 3 stent layers).  August 2021 - mid LCX overlapping 2.5x12 and 2.25x20 drug-eluting Synergy (the distal stents overlaps the previously placed Taxus stent from 2010)  She had a normal nuclear stress test in November 2016. EF was 56%. She had a "false positive" ECG response.  In July 2020 she had a moderate sized area of ischemia in the anteroseptal wall,  EF 52%, Prior to placement of the mid LAD stent.  Echo in August 2021 showed normal left ventricular regional motion and EF 55-60%.  Past Medical History:  Diagnosis Date  . Asthma   . Cancer ([place])    basal cell chest  . Complication of anesthesia   . Coronary artery disease   . Diabetes mellitus without complication ([place])    type 2  . Heart murmur   . Hepatitis    auto- immune  . History of kidney stones   . History of nuclear stress test 05/21/2010   dipyridamole; normal perfusion, preserved LV systolic EF of 08%  . Hyperlipidemia   . Hypertension   . Liver failure ([place])    h/o autoimmune hepatitis   . Myocardial infarction ([place])   . Neuropathy    resolved with back surgery; tingling and burning left leg to foot  . Obesity   . PONV (postoperative nausea and vomiting)   . Psoriasis   . Tubular adenoma of colon    colon path 01/09/2017    Past Surgical History:  Procedure Laterality Date  . Carotid Doppler  10/2008   R & L ICAs 0-49% diameter reduction   . COLONOSCOPY    . CORONARY ANGIOPLASTY WITH STENT PLACEMENT  09/2008   2.25x63m Cypher DES to in-stent restenosis of prox LAD; Mini-Vision stent x2 2.0x154mto area distal of initial LAD stent; 3 2.5x1550maxus stents prox to Cypher stent in circumflex  . CORONARY ANGIOPLASTY WITH STENT PLACEMENT  09/2005   Cypher DES 3.0x22m3m distal RCA; 2.25x12mm63mi0Vision stent to mid LAD; 2.5x18mm 29mer stent to circumflex  . CORONARY STENT INTERVENTION N/A 11/13/2018   Procedure: CORONARY STENT INTERVENTION;  Surgeon: VaranaJettie Booze [facility];  Service: Cardiovascular;  Laterality: N/A;  . CORONARY STENT INTERVENTION N/A 12/02/2019   Procedure: CORONARY STENT INTERVENTION;  Surgeon: End, CNelva Bush [facility];  Service: Cardiovascular;  Laterality: N/A;  . LEFT HEART CATH AND CORONARY ANGIOGRAPHY N/A 11/13/2018   Procedure: LEFT HEART CATH AND CORONARY ANGIOGRAPHY;  Surgeon:  VaranaJettie Booze [facility];  Service: Cardiovascular;  Laterality: N/A;  . LEFT HEART CATH AND CORONARY ANGIOGRAPHY N/A 12/02/2019   Procedure: LEFT HEART CATH AND CORONARY ANGIOGRAPHY;  Surgeon: End, CNelva Bush [facility];  Service: Cardiovascular;  Laterality: N/A;  . LUMBAR LAMINECTOMY/DECOMPRESSION MICRODISCECTOMY Left 10/18/2016   Procedure: Left Lumbar Four-Five Microdiscectomy;  Surgeon: Stern,Erline Levine Location: [facility];[place]: Neurosurgery;  Laterality: Left;  Left L4-5 Microdiscectomy  . ROTATOR CUFF REPAIR Left   . SHOULDER ARTHROSCOPY W/ ROTATOR CUFF REPAIR Right   . TRANSTHORACIC ECHOCARDIOGRAM  04/2010   EF=>55%, borderline conc LVH; trace MR & TR; AV mildly sclerotic; aortic root sclerosis/calcif  . TRIGGER FINGER RELEASE Left 02/19/2020   Procedure: RELEASE TRIGGER FINGER/A-1 PULLEY;  Surgeon: CaffreEarlie Server Location: [facility];Strodes Millsvice: Orthopedics;  Laterality: Left;  . ureteral stents      Outpatient Medications Prior to  Visit  Medication Sig Dispense Refill  . acetaminophen (TYLENOL) 325 MG tablet Take 325 mg by mouth every 6 (six) hours as needed for moderate pain or headache.    . Ascorbic Acid (VITAMIN C) 500 MG CAPS Take 500 mg by mouth daily.    Marland Kitchen aspirin EC 81 MG tablet Take 1 tablet (81 mg total) by mouth daily.    . BD PEN NEEDLE NANO 2ND GEN 32G X 4 MM MISC USE AS DIRECTED FOR INSULIN ADMINISTRATION 100 each 3  . Blood Glucose Monitoring Suppl (ONETOUCH VERIO) w/Device KIT Use to check blood sugar 2 times a day 1 kit 0  . BREO ELLIPTA 200-25 MCG/INH AEPB Inhale 1 puff into the lungs at bedtime.     . carvedilol (COREG) 6.25 MG tablet TAKE 1 TABLET BY MOUTH TWICE A DAY 180 tablet 1  . clopidogrel (PLAVIX) 75 MG tablet TAKE 1 TABLET (75 MG TOTAL) BY MOUTH DAILY WITH BREAKFAST. 90 tablet 2  . Coenzyme Q10 300 MG CAPS Take 300 mg by mouth daily.    . Cranberry 300 MG tablet Take 300 mg by mouth every evening.     . fenofibrate (TRICOR) 145 MG tablet TAKE 1 TABLET (145 MG TOTAL) BY MOUTH EVERY EVENING. 90 tablet 1  . fluconazole (DIFLUCAN) 150 MG tablet Take 1 tablet (150 mg total) by mouth daily. Take 1 tablet today. Take 1 tablet in 1 week. 2 tablet 0  . glipiZIDE (GLUCOTROL) 5 MG tablet Take 0.5-1 tablets (2.5-5 mg total) by mouth daily before supper. 90 tablet 3  . Glucosamine-Chondroit-Vit C-Mn (GLUCOSAMINE CHONDR 1500 COMPLX PO) Take 1 tablet by mouth daily.    Marland Kitchen glucose blood (ONETOUCH VERIO) test strip Use to check blood sugar 2 times a day 200 each 12  . icosapent Ethyl (VASCEPA) 1 g capsule Take 2 capsules (2 g total) by mouth 2 (two) times daily. 360 capsule 3  . insulin detemir (LEVEMIR FLEXTOUCH) 100 UNIT/ML FlexPen Inject 45 Units into the skin at bedtime. (Patient taking differently: Inject 50 Units into the skin at bedtime.) 45 mL 3  . ketoconazole (NIZORAL) 2 % cream Apply 1 application topically daily as needed for irritation.    . metFORMIN (GLUCOPHAGE) 1000 MG tablet Take 1 tablet (1,000 mg total) by mouth 2 (two) times daily with a meal. 180 tablet 3  . Multiple Vitamins-Minerals (CENTRUM SILVER 50+WOMEN PO) Take 1 tablet by mouth daily.    . nitrofurantoin, macrocrystal-monohydrate, (MACROBID) 100 MG capsule Take 1 capsule (100 mg total) by mouth 2 (two) times daily. 14 capsule 0  . nitroGLYCERIN (NITROSTAT) 0.4 MG SL tablet PLACE 1 TABLET (0.4 MG TOTAL) UNDER THE TONGUE EVERY 5 (FIVE) MINUTES AS NEEDED FOR CHEST PAIN (UP TO 3 DOSES). 75 tablet 1  . ranolazine (RANEXA) 1000 MG SR tablet Take 1 tablet (1,000 mg total) by mouth 2 (two) times daily. 180 tablet 3  . ReliOn Lancets Thin 26G MISC 1 Units by Does not apply route 2 (two) times daily. E 11.9 100 each 12  . rosuvastatin (CRESTOR) 20 MG tablet TAKE 1 TABLET BY MOUTH EVERY DAY (Patient taking differently: Take 20 mg by mouth daily.) 90 tablet 3  . Semaglutide, 1 MG/DOSE, (OZEMPIC, 1 MG/DOSE,) 4 MG/3ML SOPN Inject 1 mg into the  skin once a week. 9 mL 3  . sharps container For disposing of insulin pen needles 1 each 2  . valsartan (DIOVAN) 160 MG tablet TAKE 1 TABLET BY MOUTH EVERY DAY 90 tablet 3  .  isosorbide mononitrate (IMDUR) 30 MG 24 hr tablet Take 1 tablet (30 mg total) by mouth daily. ** DO NOT CRUSH ** 90 tablet 3  . PRALUENT 150 MG/ML SOAJ INJECT 150 MG INTO THE SKIN EVERY 14 (FOURTEEN) DAYS. 6 mL 3  . sulfamethoxazole-trimethoprim (BACTRIM DS) 800-160 MG tablet Take 1 tablet by mouth 2 (two) times daily. 14 tablet 0  . azithromycin (ZITHROMAX) 250 MG tablet 2 tablets day 1, then 1 tablet days 2-4 (Patient not taking: No sig reported) 6 tablet 0  . HYDROcodone-acetaminophen (NORCO) 5-325 MG tablet Take 1 tablet by mouth every 6 (six) hours as needed for moderate pain. (Patient not taking: Reported on 08/13/2020) 20 tablet 0  . Melatonin 3 MG CAPS Take 1.5 mg by mouth at bedtime as needed (sleep). (Patient not taking: No sig reported)     No facility-administered medications prior to visit.     Allergies:   Penicillins, Coconut flavor, and Other   Social History   Socioeconomic History  . Marital status: Married    Spouse name: Not on file  . Number of children: 1  . Years of education: Not on file  . Highest education level: Not on file  Occupational History    Employer: [employer]  Tobacco Use  . Smoking status: Never Smoker  . Smokeless tobacco: Never Used  Vaping Use  . Vaping Use: Never used  Substance and Sexual Activity  . Alcohol use: No  . Drug use: No  . Sexual activity: Not Currently  Other Topics Concern  . Not on file  Social History Narrative  . Not on file   Social Determinants of Health   Financial Resource Strain: Not on file  Food Insecurity: Not on file  Transportation Needs: Not on file  Physical Activity: Not on file  Stress: Not on file  Social Connections: Not on file     ROS:   Please see the history of present illness.    ROS  All other  systems are reviewed and are negative.   PHYSICAL EXAM:   VS:  BP (!) 158/78   Pulse 90   Ht 5' 5.25" (1.657 m)   Wt 180 lb (81.6 kg)   BMI 29.72 kg/m      General: Alert, oriented x3, no distress, overweight Head: no evidence of trauma, PERRL, EOMI, no exophtalmos or lid lag, no myxedema, no xanthelasma; normal ears, nose and oropharynx Neck: normal jugular venous pulsations and no hepatojugular reflux; brisk carotid pulses without delay and no carotid bruits Chest: clear to auscultation, no signs of consolidation by percussion or palpation, normal fremitus, symmetrical and full respiratory excursions Cardiovascular: normal position and quality of the apical impulse, regular rhythm, normal first and second heart sounds, early peaking 3/6 aortic ejection no diastolic murmurs, rubs or gallops Abdomen: no tenderness or distention, no masses by palpation, no abnormal pulsatility or arterial bruits, normal bowel sounds, no hepatosplenomegaly Extremities: no clubbing, cyanosis or edema; 2+ radial, ulnar and brachial pulses bilaterally; 2+ right femoral, posterior tibial and dorsalis pedis pulses; 2+ left femoral, posterior tibial and dorsalis pedis pulses; no subclavian or femoral bruits Neurological: grossly nonfocal Psych: Normal mood and affect  Wt Readings from Last 3 Encounters:  08/20/20 180 lb (81.6 kg)  08/13/20 183 lb (83 kg)  07/28/20 181 lb 3.2 oz (82.2 kg)    Studies/Labs Reviewed:   ECHO 10/30/2019:   1. Left ventricular ejection fraction, by estimation, is 55 to 60%. The  left ventricle has  normal function. The left ventricle has no regional  wall motion abnormalities. Left ventricular diastolic parameters are  indeterminate.  2. Right ventricular systolic function is normal. The right ventricular  size is normal. There is normal pulmonary artery systolic pressure.  3. The mitral valve is normal in structure. Trivial mitral valve  regurgitation. No evidence of mitral  stenosis.  4. The aortic valve is tricuspid. Aortic valve regurgitation is not  visualized. Mild aortic valve stenosis.  5. The inferior vena cava is normal in size with greater than 50%  respiratory variability, suggesting right atrial pressure of 3 mmHg.     12/02/2019 ECHO 1. Left ventricular ejection fraction, by estimation, is 55 to 60%. The  left ventricle has normal function. The left ventricle has no regional  wall motion abnormalities.  2. Right ventricular systolic function is normal. The right ventricular  size is normal.  3. The mitral valve is grossly normal. Trivial mitral valve  regurgitation. No evidence of mitral stenosis.  4. The inferior vena cava is normal in size with greater than 50%  respiratory variability, suggesting right atrial pressure of 3 mmHg.   EKG: Not ordered today.  Her ECGs have never really been helpful when she has had acute coronary syndrome, they always look normal  Recent Labs: 12/03/2019: TSH 1.031 02/19/2020: Hemoglobin 12.0; Platelets 268 08/20/2020: ALT 17; BUN 24; Creatinine, Ser 1.36; Potassium 4.9; Sodium 140   Lipid Panel    Component Value Date/Time   CHOL 83 (L) 08/20/2020 1024   TRIG 270 (H) 08/20/2020 1024   HDL 22 (L) 08/20/2020 1024   CHOLHDL 3.8 08/20/2020 1024   CHOLHDL 6.7 12/02/2019 0336   VLDL UNABLE TO CALCULATE IF TRIGLYCERIDE OVER 400 mg/dL 12/02/2019 0336   LDLCALC 21 08/20/2020 1024   LDLDIRECT 93 11/15/2012 0829    ASSESSMENT:    1. Coronary artery disease of native artery of native heart with stable angina pectoris ([place])   2. Mixed hyperlipidemia   3. Diabetes mellitus type 2, uncontrolled, with complications ([place])   4. Aortic valve stenosis, nonrheumatic   5. Essential hypertension   6. Overweight   7. Mild persistent asthma without complication    PLAN:  In order of problems listed above:  1. CAD:   Has some symptoms suggestive of possible coronary in-stent restenosis, but also other episodes  that are more suggestive of vasospastic angina.  Continue carvedilol, increase isosorbide, start ranolazine.  She does not have any of the 500 mg tablets of ranolazine, but she tolerated the full dose in the past without complaints of dizziness.  If we cannot control her symptoms with medical therapy, would recommend coronary angiography. 2. HLP: On a very aggressive lipid-lowering therapy with PCSK9 inhibitor, high-dose statin, Vascepa, fenofibrate.  There was some degradation of her glucose control and elevation in triglycerides after her husband passed away, but she seems to be making good strides to improve this. 3. AS: The echocardiogram performed on October 30, 2019 still shows mild aortic stenosis.  We will follow up in 3-5 years unless she develops new symptoms.  No symptoms of aortic stenosis at this time.  Asked her to call for symptoms of exertional dizziness/worsening exertional dyspnea or exertional angina 4. HTN: Lower at home, elevated today.  Usually well-controlled on carvedilol and valsartan 5. DM: A1c 7.4%, but her most recent adjustment in medications was performed just a month ago.  Copies of her results sent to her endocrinologist. 6. Overweight: She has managed to lose some weight  and is now back down to a BMI of 29, in the high overweight range. 7. Reactive airway disease: No reports of increased wheezing after switching to carvedilol.  She has not increased use of her inhaler.   Medication Adjustments/Labs and Tests Ordered: Current medicines are reviewed at length with the patient today.  Concerns regarding medicines are outlined above.  Medication changes, Labs and Tests ordered today are listed in the Patient Instructions below. Patient Instructions  Medication Instructions:  INCREASE the Isosorbide to 60 mg once daily  *If you need a refill on your cardiac medications before your next appointment, please call your pharmacy*   Lab Work: Your provider would like for you to  have the following labs today: Lipid, CMET and A1C  If you have labs (blood work) drawn today and your tests are completely normal, you will receive your results only by: Marland Kitchen MyChart Message (if you have MyChart) OR . A paper copy in the mail If you have any lab test that is abnormal or we need to change your treatment, we will call you to review the results.   Testing/Procedures: None ordered   Follow-Up: At [facility], you and your health needs are our priority.  As part of our continuing mission to provide you with exceptional heart care, we have created designated Provider Care Teams.  These Care Teams include your primary Cardiologist (physician) and Advanced Practice Providers (APPs -  Physician Assistants and Nurse Practitioners) who all work together to provide you with the care you need, when you need it.  We recommend signing up for the patient portal called "MyChart".  Sign up information is provided on this After Visit Summary.  MyChart is used to connect with patients for Virtual Visits (Telemedicine).  Patients are able to view lab/test results, encounter notes, upcoming appointments, etc.  Non-urgent messages can be sent to your provider as well.   To learn more about what you can do with MyChart, go to [email or web address].    Your next appointment:   Keep your follow up on 5/27 at 9 am      Signed, Sanda Klein, MD  08/23/2020 5:45 PM    [address], [place], [place]  [address] Phone: [phone]; Fax: [phone]

## 2020-08-26 ENCOUNTER — Other Ambulatory Visit: Payer: Self-pay

## 2020-08-26 DIAGNOSIS — Z955 Presence of coronary angioplasty implant and graft: Secondary | ICD-10-CM | POA: Insufficient documentation

## 2020-08-26 DIAGNOSIS — Z20822 Contact with and (suspected) exposure to covid-19: Secondary | ICD-10-CM | POA: Insufficient documentation

## 2020-08-26 DIAGNOSIS — H8110 Benign paroxysmal vertigo, unspecified ear: Secondary | ICD-10-CM | POA: Diagnosis not present

## 2020-08-26 DIAGNOSIS — N179 Acute kidney failure, unspecified: Principal | ICD-10-CM | POA: Insufficient documentation

## 2020-08-26 DIAGNOSIS — I2510 Atherosclerotic heart disease of native coronary artery without angina pectoris: Secondary | ICD-10-CM | POA: Insufficient documentation

## 2020-08-26 DIAGNOSIS — N281 Cyst of kidney, acquired: Secondary | ICD-10-CM | POA: Diagnosis not present

## 2020-08-26 DIAGNOSIS — Z7982 Long term (current) use of aspirin: Secondary | ICD-10-CM | POA: Diagnosis not present

## 2020-08-26 DIAGNOSIS — Z794 Long term (current) use of insulin: Secondary | ICD-10-CM | POA: Insufficient documentation

## 2020-08-26 DIAGNOSIS — I10 Essential (primary) hypertension: Secondary | ICD-10-CM | POA: Insufficient documentation

## 2020-08-26 DIAGNOSIS — Z79899 Other long term (current) drug therapy: Secondary | ICD-10-CM | POA: Diagnosis not present

## 2020-08-26 DIAGNOSIS — E875 Hyperkalemia: Secondary | ICD-10-CM | POA: Insufficient documentation

## 2020-08-26 DIAGNOSIS — E119 Type 2 diabetes mellitus without complications: Secondary | ICD-10-CM | POA: Diagnosis not present

## 2020-08-26 DIAGNOSIS — Z85828 Personal history of other malignant neoplasm of skin: Secondary | ICD-10-CM | POA: Diagnosis not present

## 2020-08-26 DIAGNOSIS — Z7902 Long term (current) use of antithrombotics/antiplatelets: Secondary | ICD-10-CM | POA: Diagnosis not present

## 2020-08-26 DIAGNOSIS — J45909 Unspecified asthma, uncomplicated: Secondary | ICD-10-CM | POA: Diagnosis not present

## 2020-08-26 DIAGNOSIS — N390 Urinary tract infection, site not specified: Secondary | ICD-10-CM | POA: Insufficient documentation

## 2020-08-26 DIAGNOSIS — Z7984 Long term (current) use of oral hypoglycemic drugs: Secondary | ICD-10-CM | POA: Diagnosis not present

## 2020-08-26 DIAGNOSIS — R42 Dizziness and giddiness: Secondary | ICD-10-CM | POA: Diagnosis present

## 2020-08-27 ENCOUNTER — Observation Stay (HOSPITAL_BASED_OUTPATIENT_CLINIC_OR_DEPARTMENT_OTHER)
Admission: EM | Admit: 2020-08-27 | Discharge: 2020-08-28 | Disposition: A | Discharge status: Home / Self Care | Payer: BC Managed Care – PPO | Attending: Internal Medicine | Admitting: Internal Medicine

## 2020-08-27 ENCOUNTER — Emergency Department (HOSPITAL_BASED_OUTPATIENT_CLINIC_OR_DEPARTMENT_OTHER): Payer: BC Managed Care – PPO

## 2020-08-27 ENCOUNTER — Other Ambulatory Visit: Payer: Self-pay

## 2020-08-27 ENCOUNTER — Encounter (HOSPITAL_BASED_OUTPATIENT_CLINIC_OR_DEPARTMENT_OTHER): Payer: Self-pay

## 2020-08-27 DIAGNOSIS — E119 Type 2 diabetes mellitus without complications: Secondary | ICD-10-CM | POA: Diagnosis not present

## 2020-08-27 DIAGNOSIS — Z794 Long term (current) use of insulin: Secondary | ICD-10-CM

## 2020-08-27 DIAGNOSIS — I10 Essential (primary) hypertension: Secondary | ICD-10-CM | POA: Diagnosis not present

## 2020-08-27 DIAGNOSIS — E875 Hyperkalemia: Secondary | ICD-10-CM | POA: Diagnosis present

## 2020-08-27 DIAGNOSIS — I2510 Atherosclerotic heart disease of native coronary artery without angina pectoris: Secondary | ICD-10-CM | POA: Diagnosis not present

## 2020-08-27 DIAGNOSIS — Z79899 Other long term (current) drug therapy: Secondary | ICD-10-CM | POA: Diagnosis not present

## 2020-08-27 DIAGNOSIS — Z7984 Long term (current) use of oral hypoglycemic drugs: Secondary | ICD-10-CM | POA: Diagnosis not present

## 2020-08-27 DIAGNOSIS — N184 Chronic kidney disease, stage 4 (severe): Secondary | ICD-10-CM

## 2020-08-27 DIAGNOSIS — Z7902 Long term (current) use of antithrombotics/antiplatelets: Secondary | ICD-10-CM | POA: Diagnosis not present

## 2020-08-27 DIAGNOSIS — N390 Urinary tract infection, site not specified: Secondary | ICD-10-CM | POA: Diagnosis present

## 2020-08-27 DIAGNOSIS — R42 Dizziness and giddiness: Secondary | ICD-10-CM | POA: Diagnosis present

## 2020-08-27 DIAGNOSIS — J45909 Unspecified asthma, uncomplicated: Secondary | ICD-10-CM | POA: Diagnosis not present

## 2020-08-27 DIAGNOSIS — N179 Acute kidney failure, unspecified: Principal | ICD-10-CM | POA: Diagnosis present

## 2020-08-27 DIAGNOSIS — Z7982 Long term (current) use of aspirin: Secondary | ICD-10-CM | POA: Diagnosis not present

## 2020-08-27 DIAGNOSIS — Z955 Presence of coronary angioplasty implant and graft: Secondary | ICD-10-CM | POA: Diagnosis not present

## 2020-08-27 DIAGNOSIS — E1159 Type 2 diabetes mellitus with other circulatory complications: Secondary | ICD-10-CM

## 2020-08-27 DIAGNOSIS — Z20822 Contact with and (suspected) exposure to covid-19: Secondary | ICD-10-CM | POA: Diagnosis not present

## 2020-08-27 DIAGNOSIS — R112 Nausea with vomiting, unspecified: Secondary | ICD-10-CM | POA: Diagnosis present

## 2020-08-27 DIAGNOSIS — I152 Hypertension secondary to endocrine disorders: Secondary | ICD-10-CM

## 2020-08-27 DIAGNOSIS — H8110 Benign paroxysmal vertigo, unspecified ear: Secondary | ICD-10-CM | POA: Diagnosis not present

## 2020-08-27 DIAGNOSIS — Z85828 Personal history of other malignant neoplasm of skin: Secondary | ICD-10-CM | POA: Diagnosis not present

## 2020-08-27 DIAGNOSIS — N281 Cyst of kidney, acquired: Secondary | ICD-10-CM | POA: Diagnosis not present

## 2020-08-27 LAB — BASIC METABOLIC PANEL
Anion gap: 11 (ref 5–15)
Anion gap: 5 (ref 5–15)
BUN: 36 mg/dL — ABNORMAL HIGH (ref 8–23)
BUN: 25 mg/dL — ABNORMAL HIGH (ref 8–23)
CO2: 22 mmol/L (ref 22–32)
CO2: 22 mmol/L (ref 22–32)
Calcium: 9.8 mg/dL (ref 8.9–10.3)
Calcium: 8.9 mg/dL (ref 8.9–10.3)
Chloride: 106 mmol/L (ref 98–111)
Chloride: 109 mmol/L (ref 98–111)
Creatinine, Ser: 2.8 mg/dL — ABNORMAL HIGH (ref 0.44–1.00)
Creatinine, Ser: 1.9 mg/dL — ABNORMAL HIGH (ref 0.44–1.00)
GFR, Estimated: 19 mL/min — ABNORMAL LOW (ref >60)
GFR, Estimated: 29 mL/min — ABNORMAL LOW (ref >60)
Glucose, Bld: 107 mg/dL — ABNORMAL HIGH (ref 70–99)
Glucose, Bld: 104 mg/dL — ABNORMAL HIGH (ref 70–99)
Potassium: 5.5 mmol/L — ABNORMAL HIGH (ref 3.5–5.1)
Potassium: 5.1 mmol/L (ref 3.5–5.1)
Sodium: 139 mmol/L (ref 135–145)
Sodium: 136 mmol/L (ref 135–145)

## 2020-08-27 LAB — CBC WITH DIFFERENTIAL/PLATELET
Abs Immature Granulocytes: 0.01 10*3/uL (ref 0.00–0.07)
Basophils Absolute: 0 10*3/uL (ref 0.0–0.1)
Basophils Relative: 1 %
Eosinophils Absolute: 0.2 10*3/uL (ref 0.0–0.5)
Eosinophils Relative: 4 %
HCT: 35.7 % — ABNORMAL LOW (ref 36.0–46.0)
Hemoglobin: 11.4 g/dL — ABNORMAL LOW (ref 12.0–15.0)
Immature Granulocytes: 0 %
Lymphocytes Relative: 22 %
Lymphs Abs: 1.2 10*3/uL (ref 0.7–4.0)
MCH: 28.4 pg (ref 26.0–34.0)
MCHC: 31.9 g/dL (ref 30.0–36.0)
MCV: 88.8 fL (ref 80.0–100.0)
Monocytes Absolute: 0.5 10*3/uL (ref 0.1–1.0)
Monocytes Relative: 9 %
Neutro Abs: 3.5 10*3/uL (ref 1.7–7.7)
Neutrophils Relative %: 64 %
Platelets: 246 10*3/uL (ref 150–400)
RBC: 4.02 MIL/uL (ref 3.87–5.11)
RDW: 13.6 % (ref 11.5–15.5)
WBC: 5.4 10*3/uL (ref 4.0–10.5)
nRBC: 0 % (ref 0.0–0.2)

## 2020-08-27 LAB — CBG MONITORING, ED: Glucose-Capillary: 88 mg/dL (ref 70–99)

## 2020-08-27 LAB — URINALYSIS, ROUTINE W REFLEX MICROSCOPIC
Bilirubin Urine: NEGATIVE
Glucose, UA: NEGATIVE mg/dL
Hgb urine dipstick: NEGATIVE
Ketones, ur: NEGATIVE mg/dL
Leukocytes,Ua: NEGATIVE
Nitrite: NEGATIVE
Protein, ur: NEGATIVE mg/dL
Specific Gravity, Urine: 1.019 (ref 1.005–1.030)
pH: 5.5 (ref 5.0–8.0)

## 2020-08-27 LAB — GLUCOSE, CAPILLARY
Glucose-Capillary: 87 mg/dL (ref 70–99)
Glucose-Capillary: 154 mg/dL — ABNORMAL HIGH (ref 70–99)

## 2020-08-27 LAB — TROPONIN I (HIGH SENSITIVITY)
Troponin I (High Sensitivity): 5 ng/L (ref <18)
Troponin I (High Sensitivity): 5 ng/L (ref <18)

## 2020-08-27 LAB — RESP PANEL BY RT-PCR (FLU A&B, COVID) ARPGX2
Influenza A by PCR: NEGATIVE
Influenza B by PCR: NEGATIVE
SARS Coronavirus 2 by RT PCR: NEGATIVE

## 2020-08-27 MED ORDER — DIAZEPAM 5 MG/ML IJ SOLN
2.5000 mg | Freq: Once | INTRAMUSCULAR | Status: AC
  Administered 2020-08-27: 2.5 mg via INTRAVENOUS
  Filled 2020-08-27: qty 2

## 2020-08-27 MED ORDER — IRBESARTAN 300 MG PO TABS
300.0000 mg | ORAL_TABLET | Freq: Every day | ORAL | Status: DC
  Administered 2020-08-27: 300 mg via ORAL
  Filled 2020-08-27: qty 1

## 2020-08-27 MED ORDER — MECLIZINE HCL 25 MG PO TABS
25.0000 mg | ORAL_TABLET | Freq: Three times a day (TID) | ORAL | Status: DC | PRN
  Administered 2020-08-27: 25 mg via ORAL
  Filled 2020-08-27: qty 1

## 2020-08-27 MED ORDER — ONDANSETRON HCL 4 MG PO TABS: 4.0000 mg | ORAL_TABLET | Freq: Four times a day (QID) | ORAL | Status: DC | PRN

## 2020-08-27 MED ORDER — ACETAMINOPHEN 650 MG RE SUPP: 650.0000 mg | Freq: Four times a day (QID) | RECTAL | Status: DC | PRN

## 2020-08-27 MED ORDER — SODIUM CHLORIDE 0.9 % IV SOLN: INTRAVENOUS | Status: AC

## 2020-08-27 MED ORDER — FENOFIBRATE 160 MG PO TABS
160.0000 mg | ORAL_TABLET | Freq: Every day | ORAL | Status: DC
  Administered 2020-08-27: 160 mg via ORAL
  Filled 2020-08-27: qty 1

## 2020-08-27 MED ORDER — ONDANSETRON 4 MG PO TBDP
8.0000 mg | ORAL_TABLET | Freq: Once | ORAL | Status: AC
  Administered 2020-08-27: 8 mg via ORAL
  Filled 2020-08-27: qty 2

## 2020-08-27 MED ORDER — ONDANSETRON HCL 4 MG/2ML IJ SOLN: 4.0000 mg | Freq: Four times a day (QID) | INTRAMUSCULAR | Status: DC | PRN

## 2020-08-27 MED ORDER — SODIUM CHLORIDE 0.9 % IV BOLUS
1000.0000 mL | Freq: Once | INTRAVENOUS | Status: AC
  Administered 2020-08-27: 1000 mL via INTRAVENOUS

## 2020-08-27 MED ORDER — SODIUM CHLORIDE 0.9 % IV SOLN: INTRAVENOUS | Status: DC

## 2020-08-27 MED ORDER — SODIUM CHLORIDE 0.9% FLUSH
3.0000 mL | Freq: Two times a day (BID) | INTRAVENOUS | Status: DC
  Administered 2020-08-27: 3 mL via INTRAVENOUS

## 2020-08-27 MED ORDER — ISOSORBIDE MONONITRATE ER 60 MG PO TB24
60.0000 mg | ORAL_TABLET | Freq: Every day | ORAL | Status: DC
  Administered 2020-08-27: 60 mg via ORAL
  Filled 2020-08-27 (×2): qty 1

## 2020-08-27 MED ORDER — CARVEDILOL 6.25 MG PO TABS
6.2500 mg | ORAL_TABLET | Freq: Two times a day (BID) | ORAL | Status: DC
  Administered 2020-08-27 – 2020-08-28 (×2): 6.25 mg via ORAL
  Filled 2020-08-27 (×2): qty 1

## 2020-08-27 MED ORDER — HEPARIN SODIUM (PORCINE) 5000 UNIT/ML IJ SOLN
5000.0000 [IU] | Freq: Three times a day (TID) | INTRAMUSCULAR | Status: DC
  Administered 2020-08-27 – 2020-08-28 (×3): 5000 [IU] via SUBCUTANEOUS
  Filled 2020-08-27 (×2): qty 1

## 2020-08-27 MED ORDER — ALBUTEROL SULFATE (2.5 MG/3ML) 0.083% IN NEBU: 2.5000 mg | INHALATION_SOLUTION | Freq: Four times a day (QID) | RESPIRATORY_TRACT | Status: DC | PRN

## 2020-08-27 MED ORDER — ONDANSETRON HCL 4 MG/2ML IJ SOLN
4.0000 mg | Freq: Once | INTRAMUSCULAR | Status: AC
  Administered 2020-08-27: 4 mg via INTRAVENOUS
  Filled 2020-08-27: qty 2

## 2020-08-27 MED ORDER — INSULIN ASPART 100 UNIT/ML IJ SOLN
0.0000 [IU] | Freq: Three times a day (TID) | INTRAMUSCULAR | Status: DC
  Administered 2020-08-28: 3 [IU] via SUBCUTANEOUS

## 2020-08-27 MED ORDER — ACETAMINOPHEN 325 MG PO TABS: 650.0000 mg | ORAL_TABLET | Freq: Four times a day (QID) | ORAL | Status: DC | PRN

## 2020-08-27 MED ORDER — CLOPIDOGREL BISULFATE 75 MG PO TABS
75.0000 mg | ORAL_TABLET | Freq: Every day | ORAL | Status: DC
  Administered 2020-08-27: 75 mg via ORAL
  Filled 2020-08-27: qty 1

## 2020-08-27 MED ORDER — ROSUVASTATIN CALCIUM 20 MG PO TABS
20.0000 mg | ORAL_TABLET | Freq: Every day | ORAL | Status: DC
  Administered 2020-08-27: 20 mg via ORAL
  Filled 2020-08-27: qty 1

## 2020-08-27 MED ORDER — ASPIRIN EC 81 MG PO TBEC
81.0000 mg | DELAYED_RELEASE_TABLET | Freq: Every day | ORAL | Status: DC
  Administered 2020-08-27: 81 mg via ORAL
  Filled 2020-08-27: qty 1

## 2020-08-27 MED ORDER — MECLIZINE HCL 25 MG PO TABS
25.0000 mg | ORAL_TABLET | Freq: Once | ORAL | Status: AC
  Administered 2020-08-27: 25 mg via ORAL
  Filled 2020-08-27: qty 1

## 2020-08-27 NOTE — ED Notes (Signed)
Attempted to call report and was told that the room had not been accepted

## 2020-08-27 NOTE — Plan of Care (Signed)
  Problem: Education: Goal: Knowledge of General Education information will improve Description: Including pain rating scale, medication(s)/side effects and non-pharmacologic comfort measures Outcome: Progressing   Problem: Nutrition: Goal: Adequate nutrition will be maintained Outcome: Progressing   Problem: Coping: Goal: Level of anxiety will decrease Outcome: Progressing   

## 2020-08-27 NOTE — H&P (Signed)
History and Physical    Haley George WCH:[id number] DOB: 08/15/1955 DOA: 08/27/2020  Referring MD/NP/PA: Jennette Kettle, MD PCP: Denita Lung, MD  Patient coming from: Transfer from [place]  Chief Complaint: Dizziness and vomiting  I have personally briefly reviewed patient's old medical records in [place]   HPI: Haley George is a 66 y.o. female with medical history significant of hypertension, hyperlipidemia, CAD s/p PCI, h/o autoimmune hepatitis, BPPV, and diabetes mellitus type 2 who presents with complaints of dizziness that started around 9:30 PM last night when she was getting up from a chair.  Describes the dizziness as the room spinning around her.  While she was walking she started leaning toward the left and had to use items in her house to brace herself.  By 10:30 PM she started having significant nausea and vomiting.  Emesis was noted to be nonbloody and nonbilious in appearance.  She became clammy and just felt bad.  At 11 PM, she had to call one of her friends that she was feeling so bad.  Denies trying anything for the symptoms.  She did not fall, lose consciousness, having fever, cough, leg swelling, calf pain, or shortness of breath symptoms.  Patient has had this happen to prior times once in 1983 and then subsequently in 2013.  Symptoms resolved with physical therapy and meclizine.  She had been on Bactrim for the last 5 days to treat for urinary tract infection for which she reported cloudy urine, urinary frequency, and dysuria.  ED Course: On admission to the emergency department patient was seen to be afebrile with blood pressures 135/75-177/90, and all other vital signs maintained.  CT scan of the brain was negative for any acute abnormalities.  Labs significant for hemoglobin 11.4, potassium 5.5, BUN 36, and creatinine 2.8.  Renal ultrasound significant for cyst of the left kidney but was otherwise unremarkable.  Patient has been given 1 L normal saline IV  fluids, Valium, meclizine, and antiemetics.  TRH called to admit.  Review of Systems  Constitutional: Positive for malaise/fatigue. Negative for fever.  HENT: Negative for nosebleeds and sinus pain.   Eyes: Negative for pain.  Respiratory: Negative for cough and shortness of breath.   Cardiovascular: Negative for chest pain and leg swelling.  Gastrointestinal: Positive for nausea and vomiting. Negative for blood in stool.  Genitourinary: Negative for dysuria and hematuria.  Musculoskeletal: Negative for falls and myalgias.  Skin: Negative for rash.  Neurological: Positive for dizziness. Negative for weakness and headaches.  Psychiatric/Behavioral: Negative for substance abuse. The patient has insomnia.      Past Medical History:  Diagnosis Date  . Asthma   . Cancer ([place])    basal cell chest  . Complication of anesthesia   . Coronary artery disease   . Diabetes mellitus without complication ([place])    type 2  . Heart murmur   . Hepatitis    auto- immune  . History of kidney stones   . History of nuclear stress test 05/21/2010   dipyridamole; normal perfusion, preserved LV systolic EF of 35%  . Hyperlipidemia   . Hypertension   . Liver failure ([place])    h/o autoimmune hepatitis   . Myocardial infarction ([place])   . Neuropathy    resolved with back surgery; tingling and burning left leg to foot  . Obesity   . PONV (postoperative nausea and vomiting)   . Psoriasis   . Tubular adenoma of colon    colon path  01/09/2017    Past Surgical History:  Procedure Laterality Date  . Carotid Doppler  10/2008   R & L ICAs 0-49% diameter reduction   . COLONOSCOPY    . CORONARY ANGIOPLASTY WITH STENT PLACEMENT  09/2008   2.25x54m Cypher DES to in-stent restenosis of prox LAD; Mini-Vision stent x2 2.0x147mto area distal of initial LAD stent; 3 2.5x1567maxus stents prox to Cypher stent in circumflex  . CORONARY ANGIOPLASTY WITH STENT PLACEMENT  09/2005   Cypher DES 3.0x22m63m distal RCA;  2.25x12mm41mi0Vision stent to mid LAD; 2.5x18mm 31mer stent to circumflex  . CORONARY STENT INTERVENTION N/A 11/13/2018   Procedure: CORONARY STENT INTERVENTION;  Surgeon: VaranaJettie Booze [facility];  Service: Cardiovascular;  Laterality: N/A;  . CORONARY STENT INTERVENTION N/A 12/02/2019   Procedure: CORONARY STENT INTERVENTION;  Surgeon: End, CNelva Bush [facility];  Service: Cardiovascular;  Laterality: N/A;  . LEFT HEART CATH AND CORONARY ANGIOGRAPHY N/A 11/13/2018   Procedure: LEFT HEART CATH AND CORONARY ANGIOGRAPHY;  Surgeon: VaranaJettie Booze [facility];  Service: Cardiovascular;  Laterality: N/A;  . LEFT HEART CATH AND CORONARY ANGIOGRAPHY N/A 12/02/2019   Procedure: LEFT HEART CATH AND CORONARY ANGIOGRAPHY;  Surgeon: End, CNelva Bush [facility];  Service: Cardiovascular;  Laterality: N/A;  . LUMBAR LAMINECTOMY/DECOMPRESSION MICRODISCECTOMY Left 10/18/2016   Procedure: Left Lumbar Four-Five Microdiscectomy;  Surgeon: Stern,Erline Levine [facility];[place]: Neurosurgery;  Laterality: Left;  Left L4-5 Microdiscectomy  . ROTATOR CUFF REPAIR Left   . SHOULDER ARTHROSCOPY W/ ROTATOR CUFF REPAIR Right   . TRANSTHORACIC ECHOCARDIOGRAM  04/2010   EF=>55%, borderline conc LVH; trace MR & TR; AV mildly sclerotic; aortic root sclerosis/calcif  . TRIGGER FINGER RELEASE Left 02/19/2020   Procedure: RELEASE TRIGGER FINGER/A-1 PULLEY;  Surgeon: CaffreEarlie Server [facility];[place]: Orthopedics;  Laterality: Left;  . ureteral stents       reports that she has never smoked. She has never used smokeless tobacco. She reports that she does not drink alcohol and does not use drugs.  Allergies  Allergen Reactions  . Penicillins Anaphylaxis and Hives     PATIENT HAD A PCN REACTION WITH IMMEDIATE RASH, FACIAL/TONGUE/THROAT SWELLING, SOB, OR LIGHTHEADEDNESS WITH HYPOTENSION:  #  #  #  YES  #   #  #   Has patient had a PCN reaction causing severe rash involving mucus membranes or skin necrosis:Unknown Has patient had a PCN reaction that required hospitalization:No Has patient had a PCN reaction occurring within the last 10 years:No If all of the above answers are "NO", then may proceed with Cephalosporin use.   . CocoMarland Kitchenut Flavor Swelling    Mouth breaks out   . Other Swelling    Walnuts cause mouth to break out    Family History  Problem Relation Age of Onset  . CAD Mother   . CAD Father   . Diabetes Brother     Prior to Admission medications   Medication Sig Start Date End Date Taking? Authorizing Provider  acetaminophen (TYLENOL) 325 MG tablet Take 325 mg by mouth every 6 (six) hours as needed for moderate pain or headache.    [provider]  Alirocumab (PRALUENT) 150 MG/ML SOAJ Inject 150 mg into the skin every 14 (fourteen) days. 08/20/20   Croitoru, Mihai, MD  Ascorbic Acid (VITAMIN C) 500 MG CAPS Take 500 mg  by mouth daily.    [provider]  aspirin EC 81 MG tablet Take 1 tablet (81 mg total) by mouth daily. 12/03/19   Dunn, Nedra Hai, PA-C  BD PEN NEEDLE NANO 2ND GEN 32G X 4 MM MISC USE AS DIRECTED FOR INSULIN ADMINISTRATION 06/22/20   [name], MD  Blood Glucose Monitoring Suppl (ONETOUCH VERIO) w/Device KIT Use to check blood sugar 2 times a day 06/27/19   [name], MD  BREO ELLIPTA 200-25 MCG/INH AEPB Inhale 1 puff into the lungs at bedtime.  11/08/18   [provider]  carvedilol (COREG) 6.25 MG tablet TAKE 1 TABLET BY MOUTH TWICE A DAY 06/22/20   Croitoru, Mihai, MD  clopidogrel (PLAVIX) 75 MG tablet TAKE 1 TABLET (75 MG TOTAL) BY MOUTH DAILY WITH BREAKFAST. 04/02/20   Croitoru, Mihai, MD  Coenzyme Q10 300 MG CAPS Take 300 mg by mouth daily.    [provider]  Cranberry 300 MG tablet Take 300 mg by mouth every evening.    [provider]  fenofibrate (TRICOR) 145 MG tablet TAKE 1 TABLET (145 MG TOTAL) BY  MOUTH EVERY EVENING. 04/14/20   Croitoru, Mihai, MD  fluconazole (DIFLUCAN) 150 MG tablet Take 1 tablet (150 mg total) by mouth daily. Take 1 tablet today. Take 1 tablet in 1 week. 03/06/20   Tysinger, Camelia Eng, PA-C  glipiZIDE (GLUCOTROL) 5 MG tablet Take 0.5-1 tablets (2.5-5 mg total) by mouth daily before supper. 03/17/20   [name], MD  Glucosamine-Chondroit-Vit C-Mn (GLUCOSAMINE CHONDR 1500 COMPLX PO) Take 1 tablet by mouth daily.    [provider]  glucose blood (ONETOUCH VERIO) test strip Use to check blood sugar 2 times a day 06/27/19   [name], MD  icosapent Ethyl (VASCEPA) 1 g capsule Take 2 capsules (2 g total) by mouth 2 (two) times daily. 03/26/20   Croitoru, Mihai, MD  insulin detemir (LEVEMIR FLEXTOUCH) 100 UNIT/ML FlexPen Inject 45 Units into the skin at bedtime. Patient taking differently: Inject 50 Units into the skin at bedtime. 03/17/20   [name], MD  isosorbide mononitrate (IMDUR) 60 MG 24 hr tablet Take 1 tablet (60 mg total) by mouth daily. ** DO NOT CRUSH ** 08/20/20   Croitoru, Mihai, MD  ketoconazole (NIZORAL) 2 % cream Apply 1 application topically daily as needed for irritation.    [provider]  metFORMIN (GLUCOPHAGE) 1000 MG tablet Take 1 tablet (1,000 mg total) by mouth 2 (two) times daily with a meal. 03/17/20   [name], MD  Multiple Vitamins-Minerals (CENTRUM SILVER 50+WOMEN PO) Take 1 tablet by mouth daily.    [provider]  nitrofurantoin, macrocrystal-monohydrate, (MACROBID) 100 MG capsule Take 1 capsule (100 mg total) by mouth 2 (two) times daily. 06/22/20   Tysinger, Camelia Eng, PA-C  nitroGLYCERIN (NITROSTAT) 0.4 MG SL tablet PLACE 1 TABLET (0.4 MG TOTAL) UNDER THE TONGUE EVERY 5 (FIVE) MINUTES AS NEEDED FOR CHEST PAIN (UP TO 3 DOSES). 06/03/20   Croitoru, Mihai, MD  ranolazine (RANEXA) 1000 MG SR tablet Take 1 tablet (1,000 mg total) by mouth 2 (two) times daily. 03/26/20   Croitoru, Mihai, MD  ReliOn  Lancets Thin 26G MISC 1 Units by Does not apply route 2 (two) times daily. E 11.9 06/26/19   [name], MD  rosuvastatin (CRESTOR) 20 MG tablet TAKE 1 TABLET BY MOUTH EVERY DAY Patient taking differently: Take 20 mg by mouth daily. 10/14/19   Croitoru, Mihai, MD  Semaglutide, 1 MG/DOSE, (OZEMPIC, 1 MG/DOSE,)  4 MG/3ML SOPN Inject 1 mg into the skin once a week. 07/28/20   [name], MD  sharps container For disposing of insulin pen needles 06/19/19   [name], MD  sulfamethoxazole-trimethoprim (BACTRIM DS) 800-160 MG tablet Take 1 tablet by mouth 2 (two) times daily. 08/20/20   Denita Lung, MD  valsartan (DIOVAN) 160 MG tablet TAKE 1 TABLET BY MOUTH EVERY DAY 06/01/20   Denita Lung, MD    Physical Exam:  Constitutional: Older female currently in no acute distress Vitals:   08/27/20 0858 08/27/20 0930 08/27/20 1130 08/27/20 1315  BP: (!) 165/86 (!) 158/82 (!) 160/81 (!) 173/72  Pulse: 89 85 90 84  Resp: _0 Temp:    98.1 F (36.7 C)  TempSrc:    Oral  SpO2: 100% 97% 99% 100%  Weight:      Height:       Eyes: PERRL, lids and conjunctivae normal ENMT: Mucous membranes are moist. Posterior pharynx clear of any exudate or lesions.  Neck: normal, supple, no masses, no thyromegaly Respiratory: clear to auscultation bilaterally, no wheezing, no crackles. Normal respiratory effort. No accessory muscle use.  Cardiovascular: Regular rate and rhythm, positive systolic murmurs / rubs / gallops. No extremity edema. 2+ pedal pulses. No carotid bruits.  Abdomen: no tenderness, no masses palpated. No hepatosplenomegaly. Bowel sounds positive.  Musculoskeletal: no clubbing / cyanosis. No joint deformity upper and lower extremities. Good ROM, no contractures. Normal muscle tone.  Skin: no rashes, lesions, ulcers. No induration Neurologic: CN 2-12 grossly intact. Sensation intact, DTR normal. Strength 5/5 in all 4.  Psychiatric: Normal judgment and insight. Alert and  oriented x 3. Normal mood.     Labs on Admission: I have personally reviewed following labs and imaging studies  CBC: Recent Labs  Lab 08/27/20 0043  WBC 5.4  NEUTROABS 3.5  HGB 11.4*  HCT 35.7*  MCV 88.8  PLT 093   Basic Metabolic Panel: Recent Labs  Lab 08/27/20 0043 08/27/20 1449  NA 139 136  K 5.5* 5.1  CL 106 109  CO2 22 22  GLUCOSE 107* 104*  BUN 36* 25*  CREATININE 2.80* 1.90*  CALCIUM 9.8 8.9   GFR: Estimated Creatinine Clearance: 32.4 mL/min (A) (by C-G formula based on SCr of 1.9 mg/dL (H)). Liver Function Tests: No results for input(s): AST, ALT, ALKPHOS, BILITOT, PROT, ALBUMIN in the last 168 hours. No results for input(s): LIPASE, AMYLASE in the last 168 hours. No results for input(s): AMMONIA in the last 168 hours. Coagulation Profile: No results for input(s): INR, PROTIME in the last 168 hours. Cardiac Enzymes: No results for input(s): CKTOTAL, CKMB, CKMBINDEX, TROPONINI in the last 168 hours. BNP (last 3 results) No results for input(s): PROBNP in the last 8760 hours. HbA1C: No results for input(s): HGBA1C in the last 72 hours. CBG: Recent Labs  Lab 08/27/20 0846 08/27/20 1358  GLUCAP 88 87   Lipid Profile: No results for input(s): CHOL, HDL, LDLCALC, TRIG, CHOLHDL, LDLDIRECT in the last 72 hours. Thyroid Function Tests: No results for input(s): TSH, T4TOTAL, FREET4, T3FREE, THYROIDAB in the last 72 hours. Anemia Panel: No results for input(s): VITAMINB12, FOLATE, FERRITIN, TIBC, IRON, RETICCTPCT in the last 72 hours. Urine analysis:    Component Value Date/Time   COLORURINE YELLOW 08/27/2020 [place] 08/27/2020 0213   LABSPEC 1.019 08/27/2020 0213   LABSPEC 1.025 08/13/2020 0846   PHURINE 5.5 08/27/2020 [place] 08/27/2020 2355  HGBUR NEGATIVE 08/27/2020 [place] 08/27/2020 0213   BILIRUBINUR negative 08/13/2020 0846   BILIRUBINUR n 08/29/2016 0900   BILIRUBINUR Negative  04/17/2008 1036   KETONESUR NEGATIVE 08/27/2020 0213   PROTEINUR NEGATIVE 08/27/2020 0213   UROBILINOGEN 0.2 06/22/2020 0959   UROBILINOGEN >8.0 (H) 01/03/2012 0726   NITRITE NEGATIVE 08/27/2020 0213   LEUKOCYTESUR NEGATIVE 08/27/2020 0213   LEUKOCYTESUR Negative 04/17/2008 1036   Sepsis Labs: Recent Results (from the past 240 hour(s))  Resp Panel by RT-PCR (Flu A&B, Covid) Nasopharyngeal Swab     Status: None   Collection Time: 08/27/20  3:07 AM   Specimen: Nasopharyngeal Swab; Nasopharyngeal(NP) swabs in vial transport medium  Result Value Ref Range Status   SARS Coronavirus 2 by RT PCR NEGATIVE NEGATIVE Final    Comment: (NOTE) SARS-CoV-2 target nucleic acids are NOT DETECTED.  The SARS-CoV-2 RNA is generally detectable in upper respiratory specimens during the acute phase of infection. The lowest concentration of SARS-CoV-2 viral copies this assay can detect is 138 copies/mL. A negative result does not preclude SARS-Cov-2 infection and should not be used as the sole basis for treatment or other patient management decisions. A negative result may occur with  improper specimen collection/handling, submission of specimen other than nasopharyngeal swab, presence of viral mutation(s) within the areas targeted by this assay, and inadequate number of viral copies(<138 copies/mL). A negative result must be combined with clinical observations, patient history, and epidemiological information. The expected result is Negative.  Fact Sheet for Patients:  EntrepreneurPulse.com.au  Fact Sheet for Healthcare Providers:  IncredibleEmployment.be  This test is no t yet approved or cleared by the [place] FDA and  has been authorized for detection and/or diagnosis of SARS-CoV-2 by FDA under an Emergency Use Authorization (EUA). This EUA will remain  in effect (meaning this test can be used) for the duration of the COVID-19 declaration under Section  564(b)(1) of the Act, 21 U.S.C.section 360bbb-3(b)(1), unless the authorization is terminated  or revoked sooner.       Influenza A by PCR NEGATIVE NEGATIVE Final   Influenza B by PCR NEGATIVE NEGATIVE Final    Comment: (NOTE) The Xpert Xpress SARS-CoV-2/FLU/RSV plus assay is intended as an aid in the diagnosis of influenza from Nasopharyngeal swab specimens and should not be used as a sole basis for treatment. Nasal washings and aspirates are unacceptable for Xpert Xpress SARS-CoV-2/FLU/RSV testing.  Fact Sheet for Patients: EntrepreneurPulse.com.au  Fact Sheet for Healthcare Providers: IncredibleEmployment.be  This test is not yet approved or cleared by the [place] FDA and has been authorized for detection and/or diagnosis of SARS-CoV-2 by FDA under an Emergency Use Authorization (EUA). This EUA will remain in effect (meaning this test can be used) for the duration of the COVID-19 declaration under Section 564(b)(1) of the Act, 21 U.S.C. section 360bbb-3(b)(1), unless the authorization is terminated or revoked.  Performed at [facility], [address], [place], [place] [address]      Radiological Exams on Admission: CT HEAD WO CONTRAST  Result Date: 08/27/2020 CLINICAL DATA:  Dizziness and vomiting EXAM: CT HEAD WITHOUT CONTRAST TECHNIQUE: Contiguous axial images were obtained from the base of the skull through the vertex without intravenous contrast. COMPARISON:  None. FINDINGS: Brain: There is no mass, hemorrhage or extra-axial collection. The size and configuration of the ventricles and extra-axial CSF spaces are normal. The brain parenchyma is normal, without acute or chronic infarction. Vascular: No abnormal hyperdensity of the major intracranial arteries or dural  venous sinuses. No intracranial atherosclerosis. Skull: The visualized skull base, calvarium and extracranial soft tissues are normal. Sinuses/Orbits:  No fluid levels or advanced mucosal thickening of the visualized paranasal sinuses. No mastoid or middle ear effusion. The orbits are normal. IMPRESSION: Normal head CT. Electronically Signed   By: Ulyses Jarred M.D.   On: 08/27/2020 01:41   US Renal  Result Date: 08/27/2020 CLINICAL DATA:  Renal failure EXAM: RENAL / URINARY TRACT ULTRASOUND COMPLETE COMPARISON:  Abdominal ultrasound April 22, 2019 FINDINGS: Right Kidney: Renal measurements: 11.0 x 6.2 x 5.4 cm = volume: 191.7 mL. Echogenicity and renal cortical thickness are within normal limits. No mass, perinephric fluid, or hydronephrosis visualized. Fetal lobulations present, an anatomic variant. No sonographically demonstrable calculus or ureterectasis. Left Kidney: Renal measurements: 11.7 x 5.8 x 5.1 cm = volume: 181.3 mL. Echogenicity and renal cortical thickness are within normal limits. No perinephric fluid or hydronephrosis visualized. There is a cyst arising from the upper pole left kidney measuring 3.5 x 2.9 x 3.5 cm. There are fetal lobulations, an anatomic variant. No sonographically demonstrable calculus or ureterectasis. Bladder: Appears normal for degree of bladder distention. Other: None. IMPRESSION: Cyst arising from upper pole left kidney measuring 3.5 x 2.9 x 3.5 cm. Study otherwise unremarkable. Electronically Signed   By: Lowella Grip III M.D.   On: 08/27/2020 07:54    EKG: Independently reviewed.  Sinus rhythm at 89 bpm  Assessment/Plan Acute kidney injury: Patient presents with creatinine elevated up to 2.8 with BUN 36.  Suspect likely multifactorial in nature given recent nausea and vomiting as well as the fact the patient was on Bactrim to treat for urinary tract infection for -Admit to a medical telemetry bed -Normal saline IV fluids 100 mL/h overnight -Recheck kidney function in a.m.  BPPV: Improving.  Patient presents with complaints of room spinning around her starting acutely after standing up last night.   Prior history of the same in 1983 and 2013. -Meclizine as needed -PT to evaluate and treat -Transitions of care consulted for possible need of vestibular rehab referral in the outpatient setting  Hyperkalemia: Acute.  Initial potassium elevated at 5.5.  No significant EKG changes appreciated. -Continue IV fluids as seen above  Nausea and vomiting: Resolved.  Likely secondary to vertigo. -Antiemetics as needed  Urinary tract infection: Prior to arrival.  Patient had been on Bactrim for the last 5 days to treat for urinary tract infection, but had been recommended a 10-day course.  Urinalysis on admission with no signs of infection. -Recommended discontinuation of Bactrim at this time.  Essential hypertension -Continue Coreg, isosorbide mononitrate, Ranexa, pharmacy substitution of Diovan in a.m.  Coronary artery disease -Continue aspirin, Plavix, and statin  Renal cyst: Patient noted to have a 3.5 x 2.9 x 3.5 cm renal cyst.  Suspect possibly just a simple cyst.  However patient is followed in outpatient setting by Dr. Diona Fanti of urology. -Recommend patient follow-up with Dr. Diona Fanti in regards to the renal cyst in the outpatient setting  Diabetes mellitus type 2: Last hemoglobin A1c 7.4 on 08/20/2020.  Home medication regimen includes -Hypoglycemic protocols -Hold glipizide, metformin, Lantus, and Ozempic  -CBGs before every meal with moderate SSI for now  Hyperlipidemia -Continue fenofibrate  DVT prophylaxis: heparin Code Status: full  Family Communication: Friends updated at bedside Disposition Plan: Likely discharge home in a.m. Consults called: None Admission status: Observation   Norval Morton MD [facility]   If 7PM-7AM, please contact night-coverage   08/27/2020, 4:31  PM

## 2020-08-27 NOTE — H&P (Incomplete)
History and Physical    Haley George WGN:[id number] DOB: May 17, 1955 DOA: 08/27/2020  Referring MD/NP/PA: Jennette Kettle, MD PCP: Denita Lung, MD  Patient coming from: Transfer from [facility]  Chief Complaint: ***  I have personally briefly reviewed patient's old medical records in [place]   HPI: Haley George is a 65 y.o. female with medical history significant of hypertension, hyperlipidemia, CAD, h/o autoimmune hepatitis  ED Course: On admission to the emergency department patient was seen to be afebrile with blood pressures 135/75-177/90, and all other vital signs maintained.  CT scan of the brain was negative for any acute abnormalities.  Labs significant for hemoglobin 11.4, potassium 5.5, BUN 36, and creatinine 2.8.  Renal ultrasound significant for cyst of the left kidney but was otherwise unremarkable.  Patient has been given 1 L normal saline IV fluids, Valium, meclizine, and antiemetics.  TRH called to admit.  ROS  Past Medical History:  Diagnosis Date  . Asthma   . Cancer ([place])    basal cell chest  . Complication of anesthesia   . Coronary artery disease   . Diabetes mellitus without complication ([place])    type 2  . Heart murmur   . Hepatitis    auto- immune  . History of kidney stones   . History of nuclear stress test 05/21/2010   dipyridamole; normal perfusion, preserved LV systolic EF of 78%  . Hyperlipidemia   . Hypertension   . Liver failure ([place])    h/o autoimmune hepatitis   . Myocardial infarction ([place])   . Neuropathy    resolved with back surgery; tingling and burning left leg to foot  . Obesity   . PONV (postoperative nausea and vomiting)   . Psoriasis   . Tubular adenoma of colon    colon path 01/09/2017    Past Surgical History:  Procedure Laterality Date  . Carotid Doppler  10/2008   R & L ICAs 0-49% diameter reduction   . COLONOSCOPY    . CORONARY ANGIOPLASTY WITH STENT PLACEMENT  09/2008   2.25x16m Cypher DES to in-stent  restenosis of prox LAD; Mini-Vision stent x2 2.0x152mto area distal of initial LAD stent; 3 2.5x1531maxus stents prox to Cypher stent in circumflex  . CORONARY ANGIOPLASTY WITH STENT PLACEMENT  09/2005   Cypher DES 3.0x22m35m distal RCA; 2.25x12mm24mi0Vision stent to mid LAD; 2.5x18mm 22mer stent to circumflex  . CORONARY STENT INTERVENTION N/A 11/13/2018   Procedure: CORONARY STENT INTERVENTION;  Surgeon: VaranaJettie Booze [facility];  Service: Cardiovascular;  Laterality: N/A;  . CORONARY STENT INTERVENTION N/A 12/02/2019   Procedure: CORONARY STENT INTERVENTION;  Surgeon: End, CNelva Bush [facility];  Service: Cardiovascular;  Laterality: N/A;  . LEFT HEART CATH AND CORONARY ANGIOGRAPHY N/A 11/13/2018   Procedure: LEFT HEART CATH AND CORONARY ANGIOGRAPHY;  Surgeon: VaranaJettie Booze [facility];  Service: Cardiovascular;  Laterality: N/A;  . LEFT HEART CATH AND CORONARY ANGIOGRAPHY N/A 12/02/2019   Procedure: LEFT HEART CATH AND CORONARY ANGIOGRAPHY;  Surgeon: End, CNelva Bush [facility];  Service: Cardiovascular;  Laterality: N/A;  . LUMBAR LAMINECTOMY/DECOMPRESSION MICRODISCECTOMY Left 10/18/2016   Procedure: Left Lumbar Four-Five Microdiscectomy;  Surgeon: Stern,Erline Levine [facility];[place]: Neurosurgery;  Laterality: Left;  Left L4-5 Microdiscectomy  . ROTATOR CUFF REPAIR Left   . SHOULDER ARTHROSCOPY W/ ROTATOR CUFF REPAIR Right   .  TRANSTHORACIC ECHOCARDIOGRAM  04/2010   EF=>55%, borderline conc LVH; trace MR & TR; AV mildly sclerotic; aortic root sclerosis/calcif  . TRIGGER FINGER RELEASE Left 02/19/2020   Procedure: RELEASE TRIGGER FINGER/A-1 PULLEY;  Surgeon: Earlie Server, MD;  Location: [facility];  Service: Orthopedics;  Laterality: Left;  . ureteral stents       reports that she has never smoked. She has never used smokeless tobacco. She reports that she does not drink  alcohol and does not use drugs.  Allergies  Allergen Reactions  . Penicillins Anaphylaxis and Hives     PATIENT HAD A PCN REACTION WITH IMMEDIATE RASH, FACIAL/TONGUE/THROAT SWELLING, SOB, OR LIGHTHEADEDNESS WITH HYPOTENSION:  #  #  #  YES  #  #  #   Has patient had a PCN reaction causing severe rash involving mucus membranes or skin necrosis:Unknown Has patient had a PCN reaction that required hospitalization:No Has patient had a PCN reaction occurring within the last 10 years:No If all of the above answers are "NO", then may proceed with Cephalosporin use.   Marland Kitchen Coconut Flavor Swelling    Mouth breaks out   . Other Swelling    Walnuts cause mouth to break out    Family History  Problem Relation Age of Onset  . CAD Mother   . CAD Father   . Diabetes Brother     Prior to Admission medications   Medication Sig Start Date End Date Taking? Authorizing Provider  acetaminophen (TYLENOL) 325 MG tablet Take 325 mg by mouth every 6 (six) hours as needed for moderate pain or headache.    [provider]  Alirocumab (PRALUENT) 150 MG/ML SOAJ Inject 150 mg into the skin every 14 (fourteen) days. 08/20/20   Croitoru, Mihai, MD  Ascorbic Acid (VITAMIN C) 500 MG CAPS Take 500 mg by mouth daily.    [provider]  aspirin EC 81 MG tablet Take 1 tablet (81 mg total) by mouth daily. 12/03/19   Dunn, Nedra Hai, PA-C  BD PEN NEEDLE NANO 2ND GEN 32G X 4 MM MISC USE AS DIRECTED FOR INSULIN ADMINISTRATION 06/22/20   [name], MD  Blood Glucose Monitoring Suppl (ONETOUCH VERIO) w/Device KIT Use to check blood sugar 2 times a day 06/27/19   [name], MD  BREO ELLIPTA 200-25 MCG/INH AEPB Inhale 1 puff into the lungs at bedtime.  11/08/18   [provider]  carvedilol (COREG) 6.25 MG tablet TAKE 1 TABLET BY MOUTH TWICE A DAY 06/22/20   Croitoru, Mihai, MD  clopidogrel (PLAVIX) 75 MG tablet TAKE 1 TABLET (75 MG TOTAL) BY MOUTH DAILY WITH BREAKFAST. 04/02/20   Croitoru,  Mihai, MD  Coenzyme Q10 300 MG CAPS Take 300 mg by mouth daily.    [provider]  Cranberry 300 MG tablet Take 300 mg by mouth every evening.    [provider]  fenofibrate (TRICOR) 145 MG tablet TAKE 1 TABLET (145 MG TOTAL) BY MOUTH EVERY EVENING. 04/14/20   Croitoru, Mihai, MD  fluconazole (DIFLUCAN) 150 MG tablet Take 1 tablet (150 mg total) by mouth daily. Take 1 tablet today. Take 1 tablet in 1 week. 03/06/20   Tysinger, Camelia Eng, PA-C  glipiZIDE (GLUCOTROL) 5 MG tablet Take 0.5-1 tablets (2.5-5 mg total) by mouth daily before supper. 03/17/20   [name], MD  Glucosamine-Chondroit-Vit C-Mn (GLUCOSAMINE CHONDR 1500 COMPLX PO) Take 1 tablet by mouth daily.    [provider]  glucose blood (ONETOUCH VERIO) test strip Use to check blood  sugar 2 times a day 06/27/19   [name], MD  icosapent Ethyl (VASCEPA) 1 g capsule Take 2 capsules (2 g total) by mouth 2 (two) times daily. 03/26/20   Croitoru, Mihai, MD  insulin detemir (LEVEMIR FLEXTOUCH) 100 UNIT/ML FlexPen Inject 45 Units into the skin at bedtime. Patient taking differently: Inject 50 Units into the skin at bedtime. 03/17/20   [name], MD  isosorbide mononitrate (IMDUR) 60 MG 24 hr tablet Take 1 tablet (60 mg total) by mouth daily. ** DO NOT CRUSH ** 08/20/20   Croitoru, Mihai, MD  ketoconazole (NIZORAL) 2 % cream Apply 1 application topically daily as needed for irritation.    [provider]  metFORMIN (GLUCOPHAGE) 1000 MG tablet Take 1 tablet (1,000 mg total) by mouth 2 (two) times daily with a meal. 03/17/20   [name], MD  Multiple Vitamins-Minerals (CENTRUM SILVER 50+WOMEN PO) Take 1 tablet by mouth daily.    [provider]  nitrofurantoin, macrocrystal-monohydrate, (MACROBID) 100 MG capsule Take 1 capsule (100 mg total) by mouth 2 (two) times daily. 06/22/20   Tysinger, Camelia Eng, PA-C  nitroGLYCERIN (NITROSTAT) 0.4 MG SL tablet PLACE 1 TABLET (0.4 MG TOTAL)  UNDER THE TONGUE EVERY 5 (FIVE) MINUTES AS NEEDED FOR CHEST PAIN (UP TO 3 DOSES). 06/03/20   Croitoru, Mihai, MD  ranolazine (RANEXA) 1000 MG SR tablet Take 1 tablet (1,000 mg total) by mouth 2 (two) times daily. 03/26/20   Croitoru, Mihai, MD  ReliOn Lancets Thin 26G MISC 1 Units by Does not apply route 2 (two) times daily. E 11.9 06/26/19   [name], MD  rosuvastatin (CRESTOR) 20 MG tablet TAKE 1 TABLET BY MOUTH EVERY DAY Patient taking differently: Take 20 mg by mouth daily. 10/14/19   Croitoru, Mihai, MD  Semaglutide, 1 MG/DOSE, (OZEMPIC, 1 MG/DOSE,) 4 MG/3ML SOPN Inject 1 mg into the skin once a week. 07/28/20   [name], MD  sharps container For disposing of insulin pen needles 06/19/19   [name], MD  sulfamethoxazole-trimethoprim (BACTRIM DS) 800-160 MG tablet Take 1 tablet by mouth 2 (two) times daily. 08/20/20   Denita Lung, MD  valsartan (DIOVAN) 160 MG tablet TAKE 1 TABLET BY MOUTH EVERY DAY 06/01/20   Denita Lung, MD    Physical Exam:  Constitutional: NAD, calm, comfortable Vitals:   08/27/20 0858 08/27/20 0930 08/27/20 1130 08/27/20 1315  BP: (!) 165/86 (!) 158/82 (!) 160/81 (!) 173/72  Pulse: 89 85 90 84  Resp: '14 15 14 16  ' Temp:    98.1 F (36.7 C)  TempSrc:    Oral  SpO2: 100% 97% 99% 100%  Weight:      Height:       Eyes: PERRL, lids and conjunctivae normal ENMT: Mucous membranes are moist. Posterior pharynx clear of any exudate or lesions.Normal dentition.  Neck: normal, supple, no masses, no thyromegaly Respiratory: clear to auscultation bilaterally, no wheezing, no crackles. Normal respiratory effort. No accessory muscle use.  Cardiovascular: Regular rate and rhythm, no murmurs / rubs / gallops. No extremity edema. 2+ pedal pulses. No carotid bruits.  Abdomen: no tenderness, no masses palpated. No hepatosplenomegaly. Bowel sounds positive.  Musculoskeletal: no clubbing / cyanosis. No joint deformity upper and lower extremities. Good ROM, no  contractures. Normal muscle tone.  Skin: no rashes, lesions, ulcers. No induration Neurologic: CN 2-12 grossly intact. Sensation intact, DTR normal. Strength 5/5 in all 4.  Psychiatric: Normal judgment and insight. Alert and oriented x 3. Normal mood.  Labs on Admission: I have personally reviewed following labs and imaging studies  CBC: Recent Labs  Lab 08/27/20 0043  WBC 5.4  NEUTROABS 3.5  HGB 11.4*  HCT 35.7*  MCV 88.8  PLT 309   Basic Metabolic Panel: Recent Labs  Lab 08/27/20 0043  NA 139  K 5.5*  CL 106  CO2 22  GLUCOSE 107*  BUN 36*  CREATININE 2.80*  CALCIUM 9.8   GFR: Estimated Creatinine Clearance: 22 mL/min (A) (by C-G formula based on SCr of 2.8 mg/dL (H)). Liver Function Tests: No results for input(s): AST, ALT, ALKPHOS, BILITOT, PROT, ALBUMIN in the last 168 hours. No results for input(s): LIPASE, AMYLASE in the last 168 hours. No results for input(s): AMMONIA in the last 168 hours. Coagulation Profile: No results for input(s): INR, PROTIME in the last 168 hours. Cardiac Enzymes: No results for input(s): CKTOTAL, CKMB, CKMBINDEX, TROPONINI in the last 168 hours. BNP (last 3 results) No results for input(s): PROBNP in the last 8760 hours. HbA1C: No results for input(s): HGBA1C in the last 72 hours. CBG: Recent Labs  Lab 08/27/20 0846  GLUCAP 88   Lipid Profile: No results for input(s): CHOL, HDL, LDLCALC, TRIG, CHOLHDL, LDLDIRECT in the last 72 hours. Thyroid Function Tests: No results for input(s): TSH, T4TOTAL, FREET4, T3FREE, THYROIDAB in the last 72 hours. Anemia Panel: No results for input(s): VITAMINB12, FOLATE, FERRITIN, TIBC, IRON, RETICCTPCT in the last 72 hours. Urine analysis:    Component Value Date/Time   COLORURINE YELLOW 08/27/2020 0213   APPEARANCEUR CLEAR 08/27/2020 0213   LABSPEC 1.019 08/27/2020 0213   LABSPEC 1.025 08/13/2020 0846   PHURINE 5.5 08/27/2020 0213   GLUCOSEU NEGATIVE 08/27/2020 0213   HGBUR NEGATIVE  08/27/2020 0213   BILIRUBINUR NEGATIVE 08/27/2020 0213   BILIRUBINUR negative 08/13/2020 0846   BILIRUBINUR n 08/29/2016 0900   BILIRUBINUR Negative 04/17/2008 1036   KETONESUR NEGATIVE 08/27/2020 0213   PROTEINUR NEGATIVE 08/27/2020 0213   UROBILINOGEN 0.2 06/22/2020 0959   UROBILINOGEN >8.0 (H) 01/03/2012 0726   NITRITE NEGATIVE 08/27/2020 0213   LEUKOCYTESUR NEGATIVE 08/27/2020 0213   LEUKOCYTESUR Negative 04/17/2008 1036   Sepsis Labs: Recent Results (from the past 240 hour(s))  Resp Panel by RT-PCR (Flu A&B, Covid) Nasopharyngeal Swab     Status: None   Collection Time: 08/27/20  3:07 AM   Specimen: Nasopharyngeal Swab; Nasopharyngeal(NP) swabs in vial transport medium  Result Value Ref Range Status   SARS Coronavirus 2 by RT PCR NEGATIVE NEGATIVE Final    Comment: (NOTE) SARS-CoV-2 target nucleic acids are NOT DETECTED.  The SARS-CoV-2 RNA is generally detectable in upper respiratory specimens during the acute phase of infection. The lowest concentration of SARS-CoV-2 viral copies this assay can detect is 138 copies/mL. A negative result does not preclude SARS-Cov-2 infection and should not be used as the sole basis for treatment or other patient management decisions. A negative result may occur with  improper specimen collection/handling, submission of specimen other than nasopharyngeal swab, presence of viral mutation(s) within the areas targeted by this assay, and inadequate number of viral copies(<138 copies/mL). A negative result must be combined with clinical observations, patient history, and epidemiological information. The expected result is Negative.  Fact Sheet for Patients:  EntrepreneurPulse.com.au  Fact Sheet for Healthcare Providers:  IncredibleEmployment.be  This test is no t yet approved or cleared by the [place] FDA and  has been authorized for detection and/or diagnosis of SARS-CoV-2 by FDA under an  Emergency Use Authorization (EUA). This  EUA will remain  in effect (meaning this test can be used) for the duration of the COVID-19 declaration under Section 564(b)(1) of the Act, 21 U.S.C.section 360bbb-3(b)(1), unless the authorization is terminated  or revoked sooner.       Influenza A by PCR NEGATIVE NEGATIVE Final   Influenza B by PCR NEGATIVE NEGATIVE Final    Comment: (NOTE) The Xpert Xpress SARS-CoV-2/FLU/RSV plus assay is intended as an aid in the diagnosis of influenza from Nasopharyngeal swab specimens and should not be used as a sole basis for treatment. Nasal washings and aspirates are unacceptable for Xpert Xpress SARS-CoV-2/FLU/RSV testing.  Fact Sheet for Patients: EntrepreneurPulse.com.au  Fact Sheet for Healthcare Providers: IncredibleEmployment.be  This test is not yet approved or cleared by the [place] FDA and has been authorized for detection and/or diagnosis of SARS-CoV-2 by FDA under an Emergency Use Authorization (EUA). This EUA will remain in effect (meaning this test can be used) for the duration of the COVID-19 declaration under Section 564(b)(1) of the Act, 21 U.S.C. section 360bbb-3(b)(1), unless the authorization is terminated or revoked.  Performed at [facility], [address], [place], [place] [address]      Radiological Exams on Admission: CT HEAD WO CONTRAST  Result Date: 08/27/2020 CLINICAL DATA:  Dizziness and vomiting EXAM: CT HEAD WITHOUT CONTRAST TECHNIQUE: Contiguous axial images were obtained from the base of the skull through the vertex without intravenous contrast. COMPARISON:  None. FINDINGS: Brain: There is no mass, hemorrhage or extra-axial collection. The size and configuration of the ventricles and extra-axial CSF spaces are normal. The brain parenchyma is normal, without acute or chronic infarction. Vascular: No abnormal hyperdensity of the major intracranial arteries  or dural venous sinuses. No intracranial atherosclerosis. Skull: The visualized skull base, calvarium and extracranial soft tissues are normal. Sinuses/Orbits: No fluid levels or advanced mucosal thickening of the visualized paranasal sinuses. No mastoid or middle ear effusion. The orbits are normal. IMPRESSION: Normal head CT. Electronically Signed   By: Ulyses Jarred M.D.   On: 08/27/2020 01:41   US Renal  Result Date: 08/27/2020 CLINICAL DATA:  Renal failure EXAM: RENAL / URINARY TRACT ULTRASOUND COMPLETE COMPARISON:  Abdominal ultrasound April 22, 2019 FINDINGS: Right Kidney: Renal measurements: 11.0 x 6.2 x 5.4 cm = volume: 191.7 mL. Echogenicity and renal cortical thickness are within normal limits. No mass, perinephric fluid, or hydronephrosis visualized. Fetal lobulations present, an anatomic variant. No sonographically demonstrable calculus or ureterectasis. Left Kidney: Renal measurements: 11.7 x 5.8 x 5.1 cm = volume: 181.3 mL. Echogenicity and renal cortical thickness are within normal limits. No perinephric fluid or hydronephrosis visualized. There is a cyst arising from the upper pole left kidney measuring 3.5 x 2.9 x 3.5 cm. There are fetal lobulations, an anatomic variant. No sonographically demonstrable calculus or ureterectasis. Bladder: Appears normal for degree of bladder distention. Other: None. IMPRESSION: Cyst arising from upper pole left kidney measuring 3.5 x 2.9 x 3.5 cm. Study otherwise unremarkable. Electronically Signed   By: Lowella Grip III M.D.   On: 08/27/2020 07:54    EKG: Independently reviewed. ***  Assessment/Plan Acute kidney injury  -Admit to a medical telemetry bed   BPPV -PT to eval and  DVT prophylaxis: ***   Code Status: ***  Family Communication: ***  Disposition Plan: ***  Consults called: ***  Admission status: ***  Norval Morton MD [facility]   If 7PM-7AM, please contact night-coverage   08/27/2020, 1:43 PM

## 2020-08-27 NOTE — ED Notes (Signed)
Pt went to the bathroom ambulatory with stand by  Assistance

## 2020-08-27 NOTE — ED Triage Notes (Signed)
Pt c/o  Dizziness -  Worse when  Movement - and vomiting that started 2130H -  Denies CP /  Changes in  Vision  And / weakness     Has hx of vertigo and cardiac pt -  Has stent in -   Pt is alert and oriented at this time

## 2020-08-27 NOTE — Plan of Care (Signed)
  Problem: Education: Goal: Knowledge of General Education information will improve Description: Including pain rating scale, medication(s)/side effects and non-pharmacologic comfort measures Outcome: Completed/Met   Problem: Health Behavior/Discharge Planning: Goal: Ability to manage health-related needs will improve Outcome: Completed/Met   Problem: Clinical Measurements: Goal: Ability to maintain clinical measurements within normal limits will improve Outcome: Completed/Met   

## 2020-08-27 NOTE — Plan of Care (Signed)
TRH transfer note:  Called by EDP at [facility]: 64 yo F dizziness, vomiting, has AKI.  Significant cardiac history.  Started bactrim 4-5 days ago.  Nausea and dizziness with standing.  Reason for admit is the AKI.  Admitting pt to tele obs at [facility].  [facility] will assume care on arrival to accepting facility. Until arrival, care as per EDP. However, TRH available 24/7 for questions and assistance.

## 2020-08-27 NOTE — ED Provider Notes (Addendum)
[facility] EMERGENCY DEPT Provider Note   CSN: [id number] Arrival date & time: 08/26/20  2341     History Chief Complaint  Patient presents with  . Dizziness  . Emesis    Haley George is a 65 y.o. female.  Resents to the emergency department for evaluation of dizziness and vomiting.  Patient reports that symptoms began around 930.  She got up to go to the bathroom and by the time she got to the bathroom she was starting to feel dizzy.  She acutely became nauseated and vomited.  Patient reports persistent dizziness and nausea, worse with moving her head.  No headache.  No chest pain, heart palpitations or shortness of breath.  She has had 2 previous episodes of vertigo with similar symptoms.  Patient reports that she is currently being treated for UTI and also had recent cardiac med changes, so wanted to be checked out.        Past Medical History:  Diagnosis Date  . Asthma   . Cancer ([place])    basal cell chest  . Complication of anesthesia   . Coronary artery disease   . Diabetes mellitus without complication ([place])    type 2  . Heart murmur   . Hepatitis    auto- immune  . History of kidney stones   . History of nuclear stress test 05/21/2010   dipyridamole; normal perfusion, preserved LV systolic EF of 61%  . Hyperlipidemia   . Hypertension   . Liver failure ([place])    h/o autoimmune hepatitis   . Myocardial infarction ([place])   . Neuropathy    resolved with back surgery; tingling and burning left leg to foot  . Obesity   . PONV (postoperative nausea and vomiting)   . Psoriasis   . Tubular adenoma of colon    colon path 01/09/2017    Patient Active Problem List   Diagnosis Date Noted  . AKI (acute kidney injury) ([place]) 08/27/2020  . Burning with urination 06/22/2020  . Dysuria 06/22/2020  . Pelvic floor dysfunction 06/22/2020  . Mixed stress and urge urinary incontinence 06/22/2020  . Loose stools 06/22/2020  . History of colonic polyps  01/28/2020  . Normocytic anemia 12/03/2019  . NSTEMI (non-ST elevated myocardial infarction) ([place]) 12/01/2019  . CAD (coronary artery disease) 11/14/2018  . S/P drug eluting coronary stent placement 11/14/2018  . Aortic valve sclerosis 04/28/2018  . Class 1 obesity due to excess calories with serious comorbidity and body mass index (BMI) of 34.0 to 34.9 in adult 04/28/2018  . Eosinophilic asthma 60/73/7106  . Mild persistent asthma without complication 26/94/8546  . Herniated intervertebral disc of lumbar spine 10/18/2016  . Coronary artery disease of native artery of native heart with stable angina pectoris ([place]) 02/20/2014  . ASHD (arteriosclerotic heart disease) 12/03/2012  . Psoriasis 12/03/2012  . Tinnitus of both ears 12/03/2012  . Hypertension associated with diabetes ([place]) 09/24/2010  . Insulin-requiring or dependent type II diabetes mellitus ([place]) 09/24/2010  . Hyperlipidemia associated with type 2 diabetes mellitus ([place]) 09/24/2010  . Obesity (BMI 30-39.9) 09/24/2010    Past Surgical History:  Procedure Laterality Date  . Carotid Doppler  10/2008   R & L ICAs 0-49% diameter reduction   . COLONOSCOPY    . CORONARY ANGIOPLASTY WITH STENT PLACEMENT  09/2008   2.25x46m Cypher DES to in-stent restenosis of prox LAD; Mini-Vision stent x2 2.0x162mto area distal of initial LAD stent; 3 2.5x1579maxus stents prox to Cypher stent in  circumflex  . CORONARY ANGIOPLASTY WITH STENT PLACEMENT  09/2005   Cypher DES 3.0x59m to distal RCA; 2.25x161mMini0Vision stent to mid LAD; 2.5x1847mypher stent to circumflex  . CORONARY STENT INTERVENTION N/A 11/13/2018   Procedure: CORONARY STENT INTERVENTION;  Surgeon: VarJettie BoozeD;  Location: [facility];  Service: Cardiovascular;  Laterality: N/A;  . CORONARY STENT INTERVENTION N/A 12/02/2019   Procedure: CORONARY STENT INTERVENTION;  Surgeon: EndNelva BushD;  Location: [facility];  Service: Cardiovascular;  Laterality:  N/A;  . LEFT HEART CATH AND CORONARY ANGIOGRAPHY N/A 11/13/2018   Procedure: LEFT HEART CATH AND CORONARY ANGIOGRAPHY;  Surgeon: VarJettie BoozeD;  Location: [facility];  Service: Cardiovascular;  Laterality: N/A;  . LEFT HEART CATH AND CORONARY ANGIOGRAPHY N/A 12/02/2019   Procedure: LEFT HEART CATH AND CORONARY ANGIOGRAPHY;  Surgeon: EndNelva BushD;  Location: [facility];  Service: Cardiovascular;  Laterality: N/A;  . LUMBAR LAMINECTOMY/DECOMPRESSION MICRODISCECTOMY Left 10/18/2016   Procedure: Left Lumbar Four-Five Microdiscectomy;  Surgeon: SteErline LevineD;  [facility];  Laterality: Left;  Left L4-5 Microdiscectomy  . ROTATOR CUFF REPAIR Left   . SHOULDER ARTHROSCOPY W/ ROTATOR CUFF REPAIR Right   . TRANSTHORACIC ECHOCARDIOGRAM  04/2010   EF=>55%, borderline conc LVH; trace MR & TR; AV mildly sclerotic; aortic root sclerosis/calcif  . TRIGGER FINGER RELEASE Left 02/19/2020   Procedure: RELEASE TRIGGER FINGER/A-1 PULLEY;  Surgeon: CafEarlie ServerD;  [facility];  Laterality: Left;  . ureteral stents       OB History   No obstetric history on file.     Family History  Problem Relation Age of Onset  . CAD Mother   . CAD Father   . Diabetes Brother     Social History   Tobacco Use  . Smoking status: Never Smoker  . Smokeless tobacco: Never Used  Vaping Use  . Vaping Use: Never used  Substance Use Topics  . Alcohol use: No  . Drug use: No    Home Medications Prior to Admission medications   Medication Sig Start Date End Date Taking? Authorizing Provider  acetaminophen (TYLENOL) 325 MG tablet Take 325 mg by mouth every 6 (six) hours as needed for moderate pain or headache.    [provider]  Alirocumab (PRALUENT) 150 MG/ML SOAJ Inject 150 mg into the skin every 14 (fourteen) days. 08/20/20   Croitoru, Mihai, MD  Ascorbic Acid (VITAMIN C) 500 MG CAPS Take 500 mg by mouth daily.     [provider]  aspirin EC 81 MG tablet Take 1 tablet (81 mg total) by mouth daily. 12/03/19   Dunn, DayNedra HaiA-C  BD PEN NEEDLE NANO 2ND GEN 32G X 4 MM MISC USE AS DIRECTED FOR INSULIN ADMINISTRATION 06/22/20   GhePhilemon KingdomD  Blood Glucose Monitoring Suppl (ONETOUCH VERIO) w/Device KIT Use to check blood sugar 2 times a day 06/27/19   GhePhilemon KingdomD  BREO ELLIPTA 200-25 MCG/INH AEPB Inhale 1 puff into the lungs at bedtime.  11/08/18   [provider]  carvedilol (COREG) 6.25 MG tablet TAKE 1 TABLET BY MOUTH TWICE A DAY 06/22/20   Croitoru, Mihai, MD  clopidogrel (PLAVIX) 75 MG tablet TAKE 1 TABLET (75 MG TOTAL) BY MOUTH DAILY WITH BREAKFAST. 04/02/20   Croitoru, Mihai, MD  Coenzyme Q10 300 MG CAPS Take 300 mg by mouth daily.    [provider]  Cranberry  300 MG tablet Take 300 mg by mouth every evening.    [provider]  fenofibrate (TRICOR) 145 MG tablet TAKE 1 TABLET (145 MG TOTAL) BY MOUTH EVERY EVENING. 04/14/20   Croitoru, Mihai, MD  fluconazole (DIFLUCAN) 150 MG tablet Take 1 tablet (150 mg total) by mouth daily. Take 1 tablet today. Take 1 tablet in 1 week. 03/06/20   Tysinger, Camelia Eng, PA-C  glipiZIDE (GLUCOTROL) 5 MG tablet Take 0.5-1 tablets (2.5-5 mg total) by mouth daily before supper. 03/17/20   [name], MD  Glucosamine-Chondroit-Vit C-Mn (GLUCOSAMINE CHONDR 1500 COMPLX PO) Take 1 tablet by mouth daily.    [provider]  glucose blood (ONETOUCH VERIO) test strip Use to check blood sugar 2 times a day 06/27/19   [name], MD  icosapent Ethyl (VASCEPA) 1 g capsule Take 2 capsules (2 g total) by mouth 2 (two) times daily. 03/26/20   Croitoru, Mihai, MD  insulin detemir (LEVEMIR FLEXTOUCH) 100 UNIT/ML FlexPen Inject 45 Units into the skin at bedtime. Patient taking differently: Inject 50 Units into the skin at bedtime. 03/17/20   [name], MD  isosorbide mononitrate (IMDUR) 60 MG 24 hr tablet Take 1 tablet  (60 mg total) by mouth daily. ** DO NOT CRUSH ** 08/20/20   Croitoru, Mihai, MD  ketoconazole (NIZORAL) 2 % cream Apply 1 application topically daily as needed for irritation.    [provider]  metFORMIN (GLUCOPHAGE) 1000 MG tablet Take 1 tablet (1,000 mg total) by mouth 2 (two) times daily with a meal. 03/17/20   [name], MD  Multiple Vitamins-Minerals (CENTRUM SILVER 50+WOMEN PO) Take 1 tablet by mouth daily.    [provider]  nitrofurantoin, macrocrystal-monohydrate, (MACROBID) 100 MG capsule Take 1 capsule (100 mg total) by mouth 2 (two) times daily. 06/22/20   Tysinger, Camelia Eng, PA-C  nitroGLYCERIN (NITROSTAT) 0.4 MG SL tablet PLACE 1 TABLET (0.4 MG TOTAL) UNDER THE TONGUE EVERY 5 (FIVE) MINUTES AS NEEDED FOR CHEST PAIN (UP TO 3 DOSES). 06/03/20   Croitoru, Mihai, MD  ranolazine (RANEXA) 1000 MG SR tablet Take 1 tablet (1,000 mg total) by mouth 2 (two) times daily. 03/26/20   Croitoru, Mihai, MD  ReliOn Lancets Thin 26G MISC 1 Units by Does not apply route 2 (two) times daily. E 11.9 06/26/19   [name], MD  rosuvastatin (CRESTOR) 20 MG tablet TAKE 1 TABLET BY MOUTH EVERY DAY Patient taking differently: Take 20 mg by mouth daily. 10/14/19   Croitoru, Mihai, MD  Semaglutide, 1 MG/DOSE, (OZEMPIC, 1 MG/DOSE,) 4 MG/3ML SOPN Inject 1 mg into the skin once a week. 07/28/20   [name], MD  sharps container For disposing of insulin pen needles 06/19/19   [name], MD  sulfamethoxazole-trimethoprim (BACTRIM DS) 800-160 MG tablet Take 1 tablet by mouth 2 (two) times daily. 08/20/20   Denita Lung, MD  valsartan (DIOVAN) 160 MG tablet TAKE 1 TABLET BY MOUTH EVERY DAY 06/01/20   Denita Lung, MD    Allergies    Penicillins, Coconut flavor, and Other  Review of Systems   Review of Systems  Gastrointestinal: Positive for nausea and vomiting.  Neurological: Positive for dizziness.  All other systems reviewed and are negative.   Physical Exam Updated  Vital Signs BP (!) 158/77   Pulse 86   Temp 99 F (37.2 C) (Oral)   Resp 12   Ht '5\' 5"'  (1.651 m)   Wt 81.6 kg   SpO2 100%   BMI 29.94  kg/m   Physical Exam Vitals and nursing note reviewed.  Constitutional:      General: She is not in acute distress.    Appearance: Normal appearance. She is well-developed.  HENT:     Head: Normocephalic and atraumatic.     Right Ear: Hearing normal.     Left Ear: Hearing normal.     Nose: Nose normal.  Eyes:     Conjunctiva/sclera: Conjunctivae normal.     Pupils: Pupils are equal, round, and reactive to light.  Cardiovascular:     Rate and Rhythm: Regular rhythm.     Heart sounds: S1 normal and S2 normal. No murmur heard. No friction rub. No gallop.   Pulmonary:     Effort: Pulmonary effort is normal. No respiratory distress.     Breath sounds: Normal breath sounds.  Chest:     Chest wall: No tenderness.  Abdominal:     General: Bowel sounds are normal.     Palpations: Abdomen is soft.     Tenderness: There is no abdominal tenderness. There is no guarding or rebound. Negative signs include Murphy's sign and McBurney's sign.     Hernia: No hernia is present.  Musculoskeletal:        General: Normal range of motion.     Cervical back: Normal range of motion and neck supple.  Skin:    General: Skin is warm and dry.     Findings: No rash.  Neurological:     Mental Status: She is alert and oriented to person, place, and time.     GCS: GCS eye subscore is 4. GCS verbal subscore is 5. GCS motor subscore is 6.     Cranial Nerves: No cranial nerve deficit.     Sensory: No sensory deficit.     Coordination: Coordination normal.  Psychiatric:        Speech: Speech normal.        Behavior: Behavior normal.        Thought Content: Thought content normal.     ED Results / Procedures / Treatments   Labs (all labs ordered are listed, but only abnormal results are displayed) Labs Reviewed  CBC WITH DIFFERENTIAL/PLATELET - Abnormal;  Notable for the following components:      Result Value   Hemoglobin 11.4 (*)    HCT 35.7 (*)    All other components within normal limits  BASIC METABOLIC PANEL - Abnormal; Notable for the following components:   Potassium 5.5 (*)    Glucose, Bld 107 (*)    BUN 36 (*)    Creatinine, Ser 2.80 (*)    GFR, Estimated 19 (*)    All other components within normal limits  RESP PANEL BY RT-PCR (FLU A&B, COVID) ARPGX2  URINALYSIS, ROUTINE W REFLEX MICROSCOPIC  TROPONIN I (HIGH SENSITIVITY)  TROPONIN I (HIGH SENSITIVITY)    EKG EKG Interpretation  Date/Time:  Thursday Aug 27 2020 00:15:03 EDT Ventricular Rate:  89 PR Interval:  125 QRS Duration: 84 QT Interval:  334 QTC Calculation: 407 R Axis:   68 Text Interpretation: Sinus rhythm Normal ECG Confirmed by Orpah Greek [phone]) on 08/27/2020 12:28:35 AM   Radiology CT HEAD WO CONTRAST  Result Date: 08/27/2020 CLINICAL DATA:  Dizziness and vomiting EXAM: CT HEAD WITHOUT CONTRAST TECHNIQUE: Contiguous axial images were obtained from the base of the skull through the vertex without intravenous contrast. COMPARISON:  None. FINDINGS: Brain: There is no mass, hemorrhage or extra-axial collection. The size and configuration of  the ventricles and extra-axial CSF spaces are normal. The brain parenchyma is normal, without acute or chronic infarction. Vascular: No abnormal hyperdensity of the major intracranial arteries or dural venous sinuses. No intracranial atherosclerosis. Skull: The visualized skull base, calvarium and extracranial soft tissues are normal. Sinuses/Orbits: No fluid levels or advanced mucosal thickening of the visualized paranasal sinuses. No mastoid or middle ear effusion. The orbits are normal. IMPRESSION: Normal head CT. Electronically Signed   By: Ulyses Jarred M.D.   On: 08/27/2020 01:41    Procedures Procedures   Medications Ordered in ED Medications  0.9 %  sodium chloride infusion (has no administration in time  range)  ondansetron (ZOFRAN) injection 4 mg (4 mg Intravenous Given 08/27/20 0042)  diazepam (VALIUM) injection 2.5 mg (2.5 mg Intravenous Given 08/27/20 0041)  meclizine (ANTIVERT) tablet 25 mg (25 mg Oral Given 08/27/20 0123)  sodium chloride 0.9 % bolus 1,000 mL (1,000 mLs Intravenous New Bag/Given 08/27/20 0234)  diazepam (VALIUM) injection 2.5 mg (2.5 mg Intravenous Given 08/27/20 1610)    ED Course  I have reviewed the triage vital signs and the nursing notes.  Pertinent labs & imaging results that were available during my care of the patient were reviewed by me and considered in my medical decision making (see chart for details).    MDM Rules/Calculators/A&P                          Patient presents to the emergency department for evaluation of dizziness with nausea and vomiting.  Patient had onset of dizziness which is vertiginous tonight around 9:30 PM.  Patient feels more comfortable lying still but symptoms are exacerbated by moving her head, standing or moving in space.  She does have a history of vertigo.  No focal neurologic deficits noted.  Patient recently saw her cardiologist.  She has a history of coronary artery disease and also coronary vasospasm.  She has been experiencing increased dyspnea and therefore had her Imdur dosing doubled to help prevent vasospasm.  Patient also recently started on Bactrim (?etiology of renal injury) for a UTI.  UTI symptoms are resolved.  No other new medications.  Renal Ultrasound not available tonight.  Patient's head CT unremarkable.  Lab work reveals acute kidney injury.  She had lab work performed on April 28 and her GFR was 55.  GFR today is 19.  This is a significant change.  Etiology is unclear.  Initiated IV hydration, will admit.  Addendum: Patient held in ED overnight, awaiting bed assignment. Will order Renal Ultrasound.  Final Clinical Impression(s) / ED Diagnoses Final diagnoses:  Vertigo  AKI (acute kidney injury) [facility])    Rx / DC  Orders ED Discharge Orders    None       Kenley Troop, Gwenyth Allegra, MD 08/27/20 0255    Orpah Greek, MD 08/27/20 0320    Orpah Greek, MD 08/27/20 [phone]

## 2020-08-27 NOTE — Progress Notes (Signed)
PT Cancellation Note  Patient Details Name: Haley George MRN: [id number] DOB: 03-22-1956   Cancelled Treatment:    Reason Eval/Treat Not Completed: PT screened, no needs identified, will sign off. After discussion with the pt and her family, the pt has no acute PT needs, has been mobilizing independently in her room without need for assist at this time. The pt does report that her sx are consistent with vertigo which she has experienced in the past, and would like an OP vestibular PT follow up. Please feel free to re-consult if change in status or needs.   Hardie Pulley, DPT   Acute Rehabilitation Department Pager #: [phone]   Otho Bellows 08/27/2020, 4:57 PM

## 2020-08-27 NOTE — ED Notes (Signed)
2nd attempt to call Charge Nurse and RN tied up with a Pt

## 2020-08-27 NOTE — ED Notes (Signed)
Ultrasound in room

## 2020-08-27 NOTE — ED Notes (Signed)
Report given to [facility] 

## 2020-08-27 NOTE — ED Notes (Signed)
Per MD Pt given saltine crackers and a little water

## 2020-08-28 DIAGNOSIS — H8112 Benign paroxysmal vertigo, left ear: Secondary | ICD-10-CM

## 2020-08-28 DIAGNOSIS — E1159 Type 2 diabetes mellitus with other circulatory complications: Secondary | ICD-10-CM

## 2020-08-28 DIAGNOSIS — N179 Acute kidney failure, unspecified: Secondary | ICD-10-CM | POA: Diagnosis not present

## 2020-08-28 DIAGNOSIS — I152 Hypertension secondary to endocrine disorders: Secondary | ICD-10-CM

## 2020-08-28 DIAGNOSIS — N390 Urinary tract infection, site not specified: Secondary | ICD-10-CM | POA: Diagnosis not present

## 2020-08-28 DIAGNOSIS — I10 Essential (primary) hypertension: Secondary | ICD-10-CM | POA: Diagnosis not present

## 2020-08-28 LAB — BASIC METABOLIC PANEL
Anion gap: 7 (ref 5–15)
BUN: 26 mg/dL — ABNORMAL HIGH (ref 8–23)
CO2: 20 mmol/L — ABNORMAL LOW (ref 22–32)
Calcium: 8.6 mg/dL — ABNORMAL LOW (ref 8.9–10.3)
Chloride: 109 mmol/L (ref 98–111)
Creatinine, Ser: 1.67 mg/dL — ABNORMAL HIGH (ref 0.44–1.00)
GFR, Estimated: 34 mL/min — ABNORMAL LOW (ref >60)
Glucose, Bld: 118 mg/dL — ABNORMAL HIGH (ref 70–99)
Potassium: 5 mmol/L (ref 3.5–5.1)
Sodium: 136 mmol/L (ref 135–145)

## 2020-08-28 LAB — CBC
HCT: 31.6 % — ABNORMAL LOW (ref 36.0–46.0)
Hemoglobin: 10.1 g/dL — ABNORMAL LOW (ref 12.0–15.0)
MCH: 28.7 pg (ref 26.0–34.0)
MCHC: 32 g/dL (ref 30.0–36.0)
MCV: 89.8 fL (ref 80.0–100.0)
Platelets: 195 10*3/uL (ref 150–400)
RBC: 3.52 MIL/uL — ABNORMAL LOW (ref 3.87–5.11)
RDW: 13.2 % (ref 11.5–15.5)
WBC: 3.3 10*3/uL — ABNORMAL LOW (ref 4.0–10.5)
nRBC: 0 % (ref 0.0–0.2)

## 2020-08-28 LAB — GLUCOSE, CAPILLARY: Glucose-Capillary: 179 mg/dL — ABNORMAL HIGH (ref 70–99)

## 2020-08-28 MED ORDER — VALSARTAN 160 MG PO TABS: 160.0000 mg | ORAL_TABLET | Freq: Every day | ORAL | 3 refills | Status: DC

## 2020-08-28 MED ORDER — INSULIN DETEMIR 100 UNIT/ML ~~LOC~~ SOLN
5.0000 [IU] | Freq: Every day | SUBCUTANEOUS | Status: DC
  Filled 2020-08-28: qty 0.05

## 2020-08-28 MED ORDER — INSULIN ASPART 100 UNIT/ML IJ SOLN: 3.0000 [IU] | Freq: Three times a day (TID) | INTRAMUSCULAR | Status: DC

## 2020-08-28 MED ORDER — INSULIN ASPART 100 UNIT/ML IJ SOLN: 0.0000 [IU] | Freq: Three times a day (TID) | INTRAMUSCULAR | Status: DC

## 2020-08-28 MED ORDER — MECLIZINE HCL 25 MG PO TABS: 25.0000 mg | ORAL_TABLET | Freq: Three times a day (TID) | ORAL | 0 refills | Status: DC | PRN

## 2020-08-28 MED ORDER — INSULIN ASPART 100 UNIT/ML IJ SOLN: 0.0000 [IU] | Freq: Every day | INTRAMUSCULAR | Status: DC

## 2020-08-28 MED ORDER — ONDANSETRON 4 MG PO TBDP: 4.0000 mg | ORAL_TABLET | Freq: Three times a day (TID) | ORAL | 0 refills | Status: DC | PRN

## 2020-08-28 NOTE — Discharge Summary (Signed)
Physician Discharge Summary  ADDALYNN KUMARI OVF:[id number] DOB: July 09, 1955 DOA: 08/27/2020  PCP: Denita Lung, MD  Admit date: 08/27/2020 Discharge date: 08/28/2020  Admitted From: Home Disposition:  Home  Recommendations for Outpatient Follow-up:  1. Follow up with PCP in 1-2 weeks, she will need to be referred to her urologist to follow-up on her simple cyst. 2. Please obtain BMP/CBC in one week   Home Health:No Equipment/Devices:None  Discharge Condition:Stable CODE STATUS:Full Diet recommendation: Heart Healthy    Brief/Interim Summary: 65 y.o. female past medical history significant of essential hypertension, hyperlipidemia CAD status post PCI, history of autoimmune hepatitis benign positional vertigo, recently treated for a UTI with Bactrim diabetes mellitus type 2 comes into the hospital complaining of dizziness that started the day prior to admission.  She relates she has had significant significant nausea and vomiting unable to keep anything down by mouth.  Discharge Diagnoses:  Principal Problem:   AKI (acute kidney injury) ([place]) Active Problems:   Essential hypertension   Insulin-requiring or dependent type II diabetes mellitus (HCC)   CAD (coronary artery disease)   BPPV (benign paroxysmal positional vertigo)   Hyperkalemia   Nausea & vomiting   Acute lower UTI  Acute kidney injury: With baseline creatinine 1.1-1.3 on admission 1.9 likely due to nausea vomiting and the use of ARB.  Nephrotoxic medications were held she was started on IV fluids and her creatinine is slowly improving she will continue to hold ARB as an outpatient for 3 additional days.  Benign positional vertigo With a history of this in the past, she was started on Zofran and Antivert and this is resolved she was tolerating her diet.  Hypokalemia: No changes in EKG she was started on IV fluids and her potassium has improved.  Nausea and vomiting: Likely due to vertigo continue antiemetics as an  outpatient.  Essential hypertension: She was continued on all of her antihypertensive medication except for her Diovan which she will continue to hold for 3 days.  CAD: Continue aspirin, Plavix and statins.  Renal cyst: Characterization appears to be a simple cyst she will follow-up with urology as an outpatient.  Insulin-dependent diabetes mellitus type 2 no changes made to her medication.  Discharge Instructions  Discharge Instructions    Diet - low sodium heart healthy   Complete by: As directed    Increase activity slowly   Complete by: As directed      Allergies as of 08/28/2020      Reactions   Penicillins Anaphylaxis, Hives   PATIENT HAD A PCN REACTION WITH IMMEDIATE RASH, FACIAL/TONGUE/THROAT SWELLING, SOB, OR LIGHTHEADEDNESS WITH HYPOTENSION:  #  #  #  YES  #  #  #   Has patient had a PCN reaction causing severe rash involving mucus membranes or skin necrosis:Unknown Has patient had a PCN reaction that required hospitalization:No Has patient had a PCN reaction occurring within the last 10 years:No If all of the above answers are "NO", then may proceed with Cephalosporin use.   Coconut Flavor Swelling   Mouth breaks out   Other Swelling   Walnuts cause mouth to break out      Medication List    STOP taking these medications   nitrofurantoin (macrocrystal-monohydrate) 100 MG capsule Commonly known as: Macrobid   sulfamethoxazole-trimethoprim 800-160 MG tablet Commonly known as: BACTRIM DS     TAKE these medications   aspirin EC 81 MG tablet Take 1 tablet (81 mg total) by mouth daily.   BD Pen  Needle Nano 2nd Gen 32G X 4 MM Misc Generic drug: Insulin Pen Needle USE AS DIRECTED FOR INSULIN ADMINISTRATION What changed: See the new instructions.   Breo Ellipta 200-25 MCG/INH Aepb Generic drug: fluticasone furoate-vilanterol Inhale 1 puff into the lungs at bedtime.   carvedilol 6.25 MG tablet Commonly known as: COREG TAKE 1 TABLET BY MOUTH TWICE A DAY    CENTRUM SILVER 50+WOMEN PO Take 1 tablet by mouth daily.   clopidogrel 75 MG tablet Commonly known as: PLAVIX TAKE 1 TABLET (75 MG TOTAL) BY MOUTH DAILY WITH BREAKFAST. What changed: when to take this   Coenzyme Q10 300 MG Caps Take 300 mg by mouth daily.   Cranberry 300 MG tablet Take 300 mg by mouth every evening.   fenofibrate 145 MG tablet Commonly known as: TRICOR TAKE 1 TABLET (145 MG TOTAL) BY MOUTH EVERY EVENING.   fluconazole 150 MG tablet Commonly known as: DIFLUCAN Take 1 tablet (150 mg total) by mouth daily. Take 1 tablet today. Take 1 tablet in 1 week.   glipiZIDE 5 MG tablet Commonly known as: GLUCOTROL Take 0.5-1 tablets (2.5-5 mg total) by mouth daily before supper. What changed:   how much to take  when to take this  reasons to take this   GLUCOSAMINE CHONDR 1500 COMPLX PO Take 1 tablet by mouth daily.   icosapent Ethyl 1 g capsule Commonly known as: Vascepa Take 2 capsules (2 g total) by mouth 2 (two) times daily.   isosorbide mononitrate 60 MG 24 hr tablet Commonly known as: IMDUR Take 1 tablet (60 mg total) by mouth daily. ** DO NOT CRUSH **   Levemir FlexTouch 100 UNIT/ML FlexPen Generic drug: insulin detemir Inject 45 Units into the skin at bedtime. What changed: how much to take   meclizine 25 MG tablet Commonly known as: ANTIVERT Take 1 tablet (25 mg total) by mouth 3 (three) times daily as needed for dizziness.   metFORMIN 1000 MG tablet Commonly known as: GLUCOPHAGE Take 1 tablet (1,000 mg total) by mouth 2 (two) times daily with a meal.   nitroGLYCERIN 0.4 MG SL tablet Commonly known as: NITROSTAT PLACE 1 TABLET (0.4 MG TOTAL) UNDER THE TONGUE EVERY 5 (FIVE) MINUTES AS NEEDED FOR CHEST PAIN (UP TO 3 DOSES).   ondansetron 4 MG disintegrating tablet Commonly known as: Zofran ODT Take 1 tablet (4 mg total) by mouth every 8 (eight) hours as needed for nausea or vomiting.   OneTouch Verio test strip Generic drug: glucose  blood Use to check blood sugar 2 times a day What changed:   how much to take  how to take this  when to take this   OneTouch Verio w/Device Kit Use to check blood sugar 2 times a day What changed:   how much to take  how to take this  when to take this   Ozempic (1 MG/DOSE) 4 MG/3ML Sopn Generic drug: Semaglutide (1 MG/DOSE) Inject 1 mg into the skin once a week.   Praluent 150 MG/ML Soaj Generic drug: Alirocumab Inject 150 mg into the skin every 14 (fourteen) days.   ranolazine 1000 MG SR tablet Commonly known as: RANEXA Take 1 tablet (1,000 mg total) by mouth 2 (two) times daily.   ReliOn Lancets Thin 26G Misc 1 Units by Does not apply route 2 (two) times daily. E 11.9   rosuvastatin 20 MG tablet Commonly known as: CRESTOR TAKE 1 TABLET BY MOUTH EVERY DAY   sharps container For disposing of insulin pen  needles   valsartan 160 MG tablet Commonly known as: DIOVAN Take 1 tablet (160 mg total) by mouth daily. Start taking on: Aug 31, 2020 What changed: These instructions start on Aug 31, 2020. If you are unsure what to do until then, ask your doctor or other care provider.       Allergies  Allergen Reactions  . Penicillins Anaphylaxis and Hives     PATIENT HAD A PCN REACTION WITH IMMEDIATE RASH, FACIAL/TONGUE/THROAT SWELLING, SOB, OR LIGHTHEADEDNESS WITH HYPOTENSION:  #  #  #  YES  #  #  #   Has patient had a PCN reaction causing severe rash involving mucus membranes or skin necrosis:Unknown Has patient had a PCN reaction that required hospitalization:No Has patient had a PCN reaction occurring within the last 10 years:No If all of the above answers are "NO", then may proceed with Cephalosporin use.   Marland Kitchen Coconut Flavor Swelling    Mouth breaks out   . Other Swelling    Walnuts cause mouth to break out    Consultations:  None   Procedures/Studies: CT HEAD WO CONTRAST  Result Date: 08/27/2020 CLINICAL DATA:  Dizziness and vomiting EXAM: CT HEAD  WITHOUT CONTRAST TECHNIQUE: Contiguous axial images were obtained from the base of the skull through the vertex without intravenous contrast. COMPARISON:  None. FINDINGS: Brain: There is no mass, hemorrhage or extra-axial collection. The size and configuration of the ventricles and extra-axial CSF spaces are normal. The brain parenchyma is normal, without acute or chronic infarction. Vascular: No abnormal hyperdensity of the major intracranial arteries or dural venous sinuses. No intracranial atherosclerosis. Skull: The visualized skull base, calvarium and extracranial soft tissues are normal. Sinuses/Orbits: No fluid levels or advanced mucosal thickening of the visualized paranasal sinuses. No mastoid or middle ear effusion. The orbits are normal. IMPRESSION: Normal head CT. Electronically Signed   By: Ulyses Jarred M.D.   On: 08/27/2020 01:41   US Renal  Result Date: 08/27/2020 CLINICAL DATA:  Renal failure EXAM: RENAL / URINARY TRACT ULTRASOUND COMPLETE COMPARISON:  Abdominal ultrasound April 22, 2019 FINDINGS: Right Kidney: Renal measurements: 11.0 x 6.2 x 5.4 cm = volume: 191.7 mL. Echogenicity and renal cortical thickness are within normal limits. No mass, perinephric fluid, or hydronephrosis visualized. Fetal lobulations present, an anatomic variant. No sonographically demonstrable calculus or ureterectasis. Left Kidney: Renal measurements: 11.7 x 5.8 x 5.1 cm = volume: 181.3 mL. Echogenicity and renal cortical thickness are within normal limits. No perinephric fluid or hydronephrosis visualized. There is a cyst arising from the upper pole left kidney measuring 3.5 x 2.9 x 3.5 cm. There are fetal lobulations, an anatomic variant. No sonographically demonstrable calculus or ureterectasis. Bladder: Appears normal for degree of bladder distention. Other: None. IMPRESSION: Cyst arising from upper pole left kidney measuring 3.5 x 2.9 x 3.5 cm. Study otherwise unremarkable. Electronically Signed   By: Lowella Grip III M.D.   On: 08/27/2020 07:54    Subjective: No new complaints.  Discharge Exam: Vitals:   08/27/20 2126 08/28/20 0546  BP: 119/65 127/68  Pulse: 86 74  Resp: 18 18  Temp: 97.8 F (36.6 C) 97.7 F (36.5 C)  SpO2: 96% 97%   Vitals:   08/27/20 1315 08/27/20 1752 08/27/20 2126 08/28/20 0546  BP: (!) 173/72 (!) 151/70 119/65 127/68  Pulse: 84 87 86 74  Resp: _0 Temp: 98.1 F (36.7 C) 99 F (37.2 C) 97.8 F (36.6 C) 97.7 F (36.5 C)  TempSrc:  Oral Oral Oral Oral  SpO2: 100% 97% 96% 97%  Weight:   81.6 kg   Height:        General: Pt is alert, awake, not in acute distress Cardiovascular: RRR, S1/S2 +, no rubs, no gallops Respiratory: CTA bilaterally, no wheezing, no rhonchi Abdominal: Soft, NT, ND, bowel sounds + Extremities: no edema, no cyanosis    The results of significant diagnostics from this hospitalization (including imaging, microbiology, ancillary and laboratory) are listed below for reference.     Microbiology: Recent Results (from the past 240 hour(s))  Resp Panel by RT-PCR (Flu A&B, Covid) Nasopharyngeal Swab     Status: None   Collection Time: 08/27/20  3:07 AM   Specimen: Nasopharyngeal Swab; Nasopharyngeal(NP) swabs in vial transport medium  Result Value Ref Range Status   SARS Coronavirus 2 by RT PCR NEGATIVE NEGATIVE Final    Comment: (NOTE) SARS-CoV-2 target nucleic acids are NOT DETECTED.  The SARS-CoV-2 RNA is generally detectable in upper respiratory specimens during the acute phase of infection. The lowest concentration of SARS-CoV-2 viral copies this assay can detect is 138 copies/mL. A negative result does not preclude SARS-Cov-2 infection and should not be used as the sole basis for treatment or other patient management decisions. A negative result may occur with  improper specimen collection/handling, submission of specimen other than nasopharyngeal swab, presence of viral mutation(s) within the areas targeted by  this assay, and inadequate number of viral copies(<138 copies/mL). A negative result must be combined with clinical observations, patient history, and epidemiological information. The expected result is Negative.  Fact Sheet for Patients:  EntrepreneurPulse.com.au  Fact Sheet for Healthcare Providers:  IncredibleEmployment.be  This test is no t yet approved or cleared by the [place] FDA and  has been authorized for detection and/or diagnosis of SARS-CoV-2 by FDA under an Emergency Use Authorization (EUA). This EUA will remain  in effect (meaning this test can be used) for the duration of the COVID-19 declaration under Section 564(b)(1) of the Act, 21 U.S.C.section 360bbb-3(b)(1), unless the authorization is terminated  or revoked sooner.       Influenza A by PCR NEGATIVE NEGATIVE Final   Influenza B by PCR NEGATIVE NEGATIVE Final    Comment: (NOTE) The Xpert Xpress SARS-CoV-2/FLU/RSV plus assay is intended as an aid in the diagnosis of influenza from Nasopharyngeal swab specimens and should not be used as a sole basis for treatment. Nasal washings and aspirates are unacceptable for Xpert Xpress SARS-CoV-2/FLU/RSV testing.  Fact Sheet for Patients: EntrepreneurPulse.com.au  Fact Sheet for Healthcare Providers: IncredibleEmployment.be  This test is not yet approved or cleared by the [place] FDA and has been authorized for detection and/or diagnosis of SARS-CoV-2 by FDA under an Emergency Use Authorization (EUA). This EUA will remain in effect (meaning this test can be used) for the duration of the COVID-19 declaration under Section 564(b)(1) of the Act, 21 U.S.C. section 360bbb-3(b)(1), unless the authorization is terminated or revoked.  Performed at [facility], [address] [place], [place] [address]      Labs: BNP (last 3 results) No results for input(s): BNP  in the last 8760 hours. Basic Metabolic Panel: Recent Labs  Lab 08/27/20 0043 08/27/20 1449 08/28/20 0540  NA 139 136 136  K 5.5* 5.1 5.0  CL 106 109 109  CO2 22 22 20*  GLUCOSE 107* 104* 118*  BUN 36* 25* 26*  CREATININE 2.80* 1.90* 1.67*  CALCIUM 9.8 8.9 8.6*   Liver Function Tests: No  results for input(s): AST, ALT, ALKPHOS, BILITOT, PROT, ALBUMIN in the last 168 hours. No results for input(s): LIPASE, AMYLASE in the last 168 hours. No results for input(s): AMMONIA in the last 168 hours. CBC: Recent Labs  Lab 08/27/20 0043 08/28/20 0540  WBC 5.4 3.3*  NEUTROABS 3.5  --   HGB 11.4* 10.1*  HCT 35.7* 31.6*  MCV 88.8 89.8  PLT 246 195   Cardiac Enzymes: No results for input(s): CKTOTAL, CKMB, CKMBINDEX, TROPONINI in the last 168 hours. BNP: Invalid input(s): POCBNP CBG: Recent Labs  Lab 08/27/20 0846 08/27/20 1358 08/27/20 1754 08/28/20 0819  GLUCAP 88 87 154* 179*   D-Dimer No results for input(s): DDIMER in the last 72 hours. Hgb A1c No results for input(s): HGBA1C in the last 72 hours. Lipid Profile No results for input(s): CHOL, HDL, LDLCALC, TRIG, CHOLHDL, LDLDIRECT in the last 72 hours. Thyroid function studies No results for input(s): TSH, T4TOTAL, T3FREE, THYROIDAB in the last 72 hours.  Invalid input(s): FREET3 Anemia work up No results for input(s): VITAMINB12, FOLATE, FERRITIN, TIBC, IRON, RETICCTPCT in the last 72 hours. Urinalysis    Component Value Date/Time   COLORURINE YELLOW 08/27/2020 0213   APPEARANCEUR CLEAR 08/27/2020 0213   LABSPEC 1.019 08/27/2020 0213   LABSPEC 1.025 08/13/2020 0846   PHURINE 5.5 08/27/2020 0213   GLUCOSEU NEGATIVE 08/27/2020 0213   HGBUR NEGATIVE 08/27/2020 0213   BILIRUBINUR NEGATIVE 08/27/2020 0213   BILIRUBINUR negative 08/13/2020 0846   BILIRUBINUR n 08/29/2016 0900   BILIRUBINUR Negative 04/17/2008 1036   KETONESUR NEGATIVE 08/27/2020 0213   PROTEINUR NEGATIVE 08/27/2020 0213   UROBILINOGEN 0.2  06/22/2020 0959   UROBILINOGEN >8.0 (H) 01/03/2012 0726   NITRITE NEGATIVE 08/27/2020 0213   LEUKOCYTESUR NEGATIVE 08/27/2020 0213   LEUKOCYTESUR Negative 04/17/2008 1036   Sepsis Labs Invalid input(s): PROCALCITONIN,  WBC,  LACTICIDVEN Microbiology Recent Results (from the past 240 hour(s))  Resp Panel by RT-PCR (Flu A&B, Covid) Nasopharyngeal Swab     Status: None   Collection Time: 08/27/20  3:07 AM   Specimen: Nasopharyngeal Swab; Nasopharyngeal(NP) swabs in vial transport medium  Result Value Ref Range Status   SARS Coronavirus 2 by RT PCR NEGATIVE NEGATIVE Final    Comment: (NOTE) SARS-CoV-2 target nucleic acids are NOT DETECTED.  The SARS-CoV-2 RNA is generally detectable in upper respiratory specimens during the acute phase of infection. The lowest concentration of SARS-CoV-2 viral copies this assay can detect is 138 copies/mL. A negative result does not preclude SARS-Cov-2 infection and should not be used as the sole basis for treatment or other patient management decisions. A negative result may occur with  improper specimen collection/handling, submission of specimen other than nasopharyngeal swab, presence of viral mutation(s) within the areas targeted by this assay, and inadequate number of viral copies(<138 copies/mL). A negative result must be combined with clinical observations, patient history, and epidemiological information. The expected result is Negative.  Fact Sheet for Patients:  EntrepreneurPulse.com.au  Fact Sheet for Healthcare Providers:  IncredibleEmployment.be  This test is no t yet approved or cleared by the [place] FDA and  has been authorized for detection and/or diagnosis of SARS-CoV-2 by FDA under an Emergency Use Authorization (EUA). This EUA will remain  in effect (meaning this test can be used) for the duration of the COVID-19 declaration under Section 564(b)(1) of the Act, 21 U.S.C.section  360bbb-3(b)(1), unless the authorization is terminated  or revoked sooner.       Influenza A by PCR NEGATIVE NEGATIVE Final  Influenza B by PCR NEGATIVE NEGATIVE Final    Comment: (NOTE) The Xpert Xpress SARS-CoV-2/FLU/RSV plus assay is intended as an aid in the diagnosis of influenza from Nasopharyngeal swab specimens and should not be used as a sole basis for treatment. Nasal washings and aspirates are unacceptable for Xpert Xpress SARS-CoV-2/FLU/RSV testing.  Fact Sheet for Patients: EntrepreneurPulse.com.au  Fact Sheet for Healthcare Providers: IncredibleEmployment.be  This test is not yet approved or cleared by the [place] FDA and has been authorized for detection and/or diagnosis of SARS-CoV-2 by FDA under an Emergency Use Authorization (EUA). This EUA will remain in effect (meaning this test can be used) for the duration of the COVID-19 declaration under Section 564(b)(1) of the Act, 21 U.S.C. section 360bbb-3(b)(1), unless the authorization is terminated or revoked.  Performed at [facility], [address], [place], [place] [address]      Time coordinating discharge: Over 30 minutes  SIGNED:   Charlynne Cousins, MD  [facility] 08/28/2020, 9:47 AM Pager   If 7PM-7AM, please contact night-coverage [email or web address] Password [email or web address]

## 2020-08-28 NOTE — Plan of Care (Signed)
  Problem: Clinical Measurements: Goal: Will remain free from infection Outcome: Completed/Met Goal: Diagnostic test results will improve Outcome: Completed/Met Goal: Respiratory complications will improve Outcome: Completed/Met Goal: Cardiovascular complication will be avoided Outcome: Completed/Met   Problem: Activity: Goal: Risk for activity intolerance will decrease Outcome: Completed/Met   Problem: Nutrition: Goal: Adequate nutrition will be maintained Outcome: Completed/Met   Problem: Coping: Goal: Level of anxiety will decrease Outcome: Completed/Met   Problem: Elimination: Goal: Will not experience complications related to bowel motility Outcome: Completed/Met Goal: Will not experience complications related to urinary retention Outcome: Completed/Met   Problem: Pain Managment: Goal: General experience of comfort will improve Outcome: Completed/Met   Problem: Safety: Goal: Ability to remain free from injury will improve Outcome: Completed/Met   Problem: Skin Integrity: Goal: Risk for impaired skin integrity will decrease Outcome: Completed/Met   

## 2020-08-28 NOTE — Progress Notes (Signed)
DISCHARGE NOTE HOME Mahala B Wolman to be discharged Home per MD order. Discussed prescriptions and follow up appointments with the patient. Prescriptions given to patient; medication list explained in detail. Patient verbalized understanding.  Skin clean, dry and intact without evidence of skin break down, no evidence of skin tears noted. IV catheter discontinued intact. Site without signs and symptoms of complications. Dressing and pressure applied. Pt denies pain at the site currently. No complaints noted.  Patient free of lines, drains, and wounds.   An After Visit Summary (AVS) was printed and given to the patient. Patient escorted via wheelchair, and discharged home via private auto.  Vira Agar, RN

## 2020-09-18 ENCOUNTER — Other Ambulatory Visit: Payer: Self-pay

## 2020-09-18 ENCOUNTER — Encounter: Payer: Self-pay | Admitting: Cardiovascular Disease

## 2020-09-18 ENCOUNTER — Ambulatory Visit: Payer: BC Managed Care – PPO | Admitting: Cardiovascular Disease

## 2020-09-18 VITALS — BP 140/88 | HR 86 | Ht 65.0 in | Wt 175.0 lb

## 2020-09-18 DIAGNOSIS — I25118 Atherosclerotic heart disease of native coronary artery with other forms of angina pectoris: Secondary | ICD-10-CM

## 2020-09-18 DIAGNOSIS — I10 Essential (primary) hypertension: Secondary | ICD-10-CM

## 2020-09-18 DIAGNOSIS — J4530 Mild persistent asthma, uncomplicated: Secondary | ICD-10-CM

## 2020-09-18 DIAGNOSIS — Z794 Long term (current) use of insulin: Secondary | ICD-10-CM

## 2020-09-18 DIAGNOSIS — E782 Mixed hyperlipidemia: Secondary | ICD-10-CM

## 2020-09-18 DIAGNOSIS — E119 Type 2 diabetes mellitus without complications: Secondary | ICD-10-CM

## 2020-09-18 DIAGNOSIS — I350 Nonrheumatic aortic (valve) stenosis: Secondary | ICD-10-CM | POA: Diagnosis not present

## 2020-09-18 DIAGNOSIS — H8112 Benign paroxysmal vertigo, left ear: Secondary | ICD-10-CM

## 2020-09-18 DIAGNOSIS — E663 Overweight: Secondary | ICD-10-CM

## 2020-09-18 NOTE — Patient Instructions (Signed)
Medication Instructions:  °No changes °*If you need a refill on your cardiac medications before your next appointment, please call your pharmacy* ° ° °Lab Work: °None ordered °If you have labs (blood work) drawn today and your tests are completely normal, you will receive your results only by: °• MyChart Message (if you have MyChart) OR °• A paper copy in the mail °If you have any lab test that is abnormal or we need to change your treatment, we will call you to review the results. ° ° °Testing/Procedures: °None ordered ° ° °Follow-Up: °At [facility], you and your health needs are our priority.  As part of our continuing mission to provide you with exceptional heart care, we have created designated Provider Care Teams.  These Care Teams include your primary Cardiologist (physician) and Advanced Practice Providers (APPs -  Physician Assistants and Nurse Practitioners) who all work together to provide you with the care you need, when you need it. ° °We recommend signing up for the patient portal called "MyChart".  Sign up information is provided on this After Visit Summary.  MyChart is used to connect with patients for Virtual Visits (Telemedicine).  Patients are able to view lab/test results, encounter notes, upcoming appointments, etc.  Non-urgent messages can be sent to your provider as well.   °To learn more about what you can do with MyChart, go to [email or web address].   ° °Your next appointment:   °3 month(s) ° °The format for your next appointment:   °In Person ° °Provider:   °You may see Mihai Croitoru, MD or one of the following Advanced Practice Providers on your designated Care Team:   °· Hao Meng, PA-C °· Angela Duke, PA-C or  °· Krista Kroeger, PA-C ° ° ° °

## 2020-09-18 NOTE — Progress Notes (Signed)
Patient ID: Haley George, female   DOB: [birth year], 65 y.o.   MRN: [id number]    Cardiology Office Note    Date:  09/18/2020   ID:  Haley George, DOB [birth year], MRN [id number]  PCP:  Haley Lung, MD  Cardiologist:   Haley Klein, MD   Chief Complaint  Patient presents with  . Follow-up  CAD  History of Present Illness:  Haley George is a 65 y.o. female with diabetes mellitus type II, severe mixed hyperlipidemia, previous percutaneous revascularization procedures performed in all 3 major coronary arteries and restenosis requiring repeat PCI, mild calcific aortic stenosis.  Most recent intervention was placement of 2 drug-eluting stents in the mid-distal left circumflex coronary artery covering a de novo mid vessel lesion and a distal in-stent restenosis lesion in the setting of small NSTEMI (peak troponin 1818) on 12/02/2019.  Just a few days after her last appointment she had abrupt onset of vertigo, nausea and vomiting.  Could not hold anything down.  She had to be hospitalized for a couple of days and received intravenous fluids.  She had acute renal insufficiency with a creatinine up to 2.8, improved to 1.67 by the time of discharge (previous baseline 1.1-1.3).  Cardiac enzymes were normal and the ECG was unremarkable.  She has recovered from this.  She has had 2 other episodes of benign positional vertigo in 1983 and in 2009 but this was the worst one yet.  She doubled her dose of isosorbide after her last appointment and has been able to mow the yard twice without experiencing any chest discomfort.  She has not yet started the Ranexa.  Its now been roughly 9 months since her last percutaneous revascularization procedure with placement of 2 new drug-eluting stents (she does have a history of diabetes mellitus and in-stent restenosis.   She continues to struggle with the loss of her lifetime soulmate and husband, Haley George.  She recently celebrated their anniversary.  Their daughter  Haley George, has still not fully made peace with his passing.  She is visiting today for the [facility] Day weekend.  She denies orthopnea, PND, edema, claudication, any focal neurological complaints, syncope or palpitations.  Hemoglobin A1c has deteriorated a little more, up to 7.4% on April 28, but she seems to be getting a better handle.  Most recent labs on 0506 for that she was mildly anemic with a hemoglobin of 10.1.  Her lipid profile was acceptable with LDL of 21, although as before she is plagued by very low HDL of only 22 and her triglycerides were mildly elevated at 270 (the previous level for her.).  She is really on excellent, state-of-the-art regimen for her metabolic problems with Haley George, Haley George, Haley George, Haley George, Haley George, as well as has Haley George and Haley George.  She developed unstable angina and she underwent cardiac catheterization on December 02, 2019.  The culprit lesion was found to be a 95% in-stent restenosis in the mid left circumflex coronary artery, as well as a de novo 70% mid left circumflex stenosis at the OM2 bifurcation.  This was treated by placement of overlapping 2.5x12 and 2.25x20 drug-eluting Synergy stents (Dr. Saunders George).  Also noted was a mild-moderate mid to distal LAD in-stent restenosis and an unchanged 80% RPDA lesion.  She had complete relief of her angina following this intervention.      Previous coronary interventions 2007  - mid LAD 2.2512 mini vision - left circumflex 2.518 Cypher  - RCA 3.023 Cypher  2010  - mid LAD  2.2512 Taxus overlapping 2.012 mini vision, overlapping another 2.012 mini vision -  left circumflex 2.516 Taxus upstream of previous stent.  July 2020  - mid LAD Synergy 2.5 x12 , overlapping the proximal area of restenosis in the mid LAD (short area of 3 stent layers).  August 2021 - mid LCX overlapping 2.5x12 and 2.25x20 drug-eluting Synergy (the distal stents overlaps the previously placed Taxus stent from 2010)  She had  a normal nuclear stress test in November 2016. EF was 56%. She had a "false positive" ECG response.  In July 2020 she had a moderate sized area of ischemia in the anteroseptal wall, EF 52%, Prior to placement of the mid LAD stent.  Echo in August 2021 showed normal left ventricular regional motion and EF 55-60%.  Past Medical History:  Diagnosis Date  . Asthma   . Cancer ([place])    basal cell chest  . Complication of anesthesia   . Coronary artery disease   . Diabetes mellitus without complication ([place])    type 2  . Heart murmur   . Hepatitis    auto- immune  . History of kidney stones   . History of nuclear stress test 05/21/2010   dipyridamole; normal perfusion, preserved LV systolic EF of 90%  . Hyperlipidemia   . Hypertension   . Liver failure ([place])    h/o autoimmune hepatitis   . Myocardial infarction ([place])   . Neuropathy    resolved with back surgery; tingling and burning left leg to foot  . Obesity   . PONV (postoperative nausea and vomiting)   . Psoriasis   . Tubular adenoma of colon    colon path 01/09/2017    Past Surgical History:  Procedure Laterality Date  . Carotid Doppler  10/2008   R & L ICAs 0-49% diameter reduction   . COLONOSCOPY    . CORONARY ANGIOPLASTY WITH STENT PLACEMENT  09/2008   2.25x53m Cypher DES to in-stent restenosis of prox LAD; Mini-Vision stent x2 2.0x178mto area distal of initial LAD stent; 3 2.5x1536maxus stents prox to Cypher stent in circumflex  . CORONARY ANGIOPLASTY WITH STENT PLACEMENT  09/2005   Cypher DES 3.0x22m58m distal RCA; 2.25x12mm8mi0Vision stent to mid LAD; 2.5x18mm 80mer stent to circumflex  . CORONARY STENT INTERVENTION N/A 11/13/2018   Procedure: CORONARY STENT INTERVENTION;  Surgeon: Haley George George [facility];  Service: Cardiovascular;  Laterality: N/A;  . CORONARY STENT INTERVENTION N/A 12/02/2019   Procedure: CORONARY STENT INTERVENTION;  Surgeon: Haley, CNelva George [facility];  Service: Cardiovascular;  Laterality: N/A;  . LEFT HEART CATH AND CORONARY ANGIOGRAPHY N/A 11/13/2018   Procedure: LEFT HEART CATH AND CORONARY ANGIOGRAPHY;  Surgeon: Haley George George [facility];  Service: Cardiovascular;  Laterality: N/A;  . LEFT HEART CATH AND CORONARY ANGIOGRAPHY N/A 12/02/2019   Procedure: LEFT HEART CATH AND CORONARY ANGIOGRAPHY;  Surgeon: Haley, CNelva George [facility];  Service: Cardiovascular;  Laterality: N/A;  . LUMBAR LAMINECTOMY/DECOMPRESSION MICRODISCECTOMY Left 10/18/2016   Procedure: Left Lumbar Four-Five Microdiscectomy;  Surgeon: Stern,Erline Levine [facility];  Laterality: Left;  Left L4-5 Microdiscectomy  . ROTATOR CUFF REPAIR Left   . SHOULDER ARTHROSCOPY W/ ROTATOR CUFF REPAIR Right   . TRANSTHORACIC ECHOCARDIOGRAM  04/2010   EF=>55%, borderline conc LVH; trace MR & TR; AV mildly sclerotic; aortic root sclerosis/calcif  . TRIGGER FINGER RELEASE  Left 02/19/2020   Procedure: RELEASE TRIGGER FINGER/A-1 PULLEY;  Surgeon: Earlie Server, MD;  Location: [place];  Service: Orthopedics;  Laterality: Left;  . ureteral stents      Outpatient Medications Prior to Visit  Medication Sig Dispense Refill  . Alirocumab (Haley George) 150 MG/ML SOAJ Inject 150 mg into the skin every 14 (fourteen) days. 6 mL 3  . aspirin EC 81 MG tablet Take 1 tablet (81 mg total) by mouth daily.    . BD PEN NEEDLE NANO 2ND GEN 32G X 4 MM MISC USE AS DIRECTED FOR INSULIN ADMINISTRATION (Patient taking differently: 1 each by Other route as directed. For insulin administration) 100 each 3  . Blood Glucose Monitoring Suppl (ONETOUCH VERIO) w/Device KIT Use to check blood sugar 2 times a day (Patient taking differently: 1 each by Other route See admin instructions. Use to check blood sugar 2 times a day) 1 kit 0  . BREO ELLIPTA 200-25 MCG/INH AEPB Inhale 1 puff into the lungs at bedtime.     . carvedilol  (COREG) 6.25 MG tablet TAKE 1 TABLET BY MOUTH TWICE A DAY (Patient taking differently: Take 6.25 mg by mouth 2 (two) times daily.) 180 tablet 1  . clopidogrel (PLAVIX) 75 MG tablet TAKE 1 TABLET (75 MG TOTAL) BY MOUTH DAILY WITH BREAKFAST. (Patient taking differently: Take 75 mg by mouth daily.) 90 tablet 2  . Coenzyme Q10 300 MG CAPS Take 300 mg by mouth daily.    . Cranberry 300 MG tablet Take 300 mg by mouth every evening.    . Haley George (TRICOR) 145 MG tablet TAKE 1 TABLET (145 MG TOTAL) BY MOUTH EVERY EVENING. 90 tablet 1  . fluconazole (DIFLUCAN) 150 MG tablet Take 1 tablet (150 mg total) by mouth daily. Take 1 tablet today. Take 1 tablet in 1 week. 2 tablet 0  . Haley George (GLUCOTROL) 5 MG tablet Take 0.5-1 tablets (2.5-5 mg total) by mouth daily before supper. (Patient taking differently: Take 2.5 mg by mouth as needed (for big meals).) 90 tablet 3  . Glucosamine-Chondroit-Vit C-Mn (GLUCOSAMINE CHONDR 1500 COMPLX PO) Take 1 tablet by mouth daily.    Marland Kitchen glucose blood (ONETOUCH VERIO) test strip Use to check blood sugar 2 times a day (Patient taking differently: 1 each by Other route See admin instructions. Use to check blood sugar 2 times a day) 200 each 12  . icosapent Ethyl (Haley George) 1 g capsule Take 2 capsules (2 g total) by mouth 2 (two) times daily. 360 capsule 3  . insulin detemir (LEVEMIR FLEXTOUCH) 100 UNIT/ML FlexPen Inject 45 Units into the skin at bedtime. (Patient taking differently: Inject 50 Units into the skin at bedtime.) 45 mL 3  . isosorbide mononitrate (IMDUR) 60 MG 24 hr tablet Take 1 tablet (60 mg total) by mouth daily. ** DO NOT CRUSH ** 90 tablet 3  . meclizine (ANTIVERT) 25 MG tablet Take 1 tablet (25 mg total) by mouth 3 (three) times daily as needed for dizziness. 15 tablet 0  . metFORMIN (GLUCOPHAGE) 1000 MG tablet Take 1 tablet (1,000 mg total) by mouth 2 (two) times daily with a meal. 180 tablet 3  . Multiple Vitamins-Minerals (CENTRUM SILVER 50+WOMEN PO) Take 1  tablet by mouth daily.    . nitroGLYCERIN (NITROSTAT) 0.4 MG SL tablet PLACE 1 TABLET (0.4 MG TOTAL) UNDER THE TONGUE EVERY 5 (FIVE) MINUTES AS NEEDED FOR CHEST PAIN (UP TO 3 DOSES). 75 tablet 1  . ondansetron (ZOFRAN ODT) 4 MG disintegrating tablet Take 1  tablet (4 mg total) by mouth every 8 (eight) hours as needed for nausea or vomiting. 20 tablet 0  . ranolazine (RANEXA) 1000 MG SR tablet Take 1 tablet (1,000 mg total) by mouth 2 (two) times daily. 180 tablet 3  . ReliOn Lancets Thin 26G MISC 1 Units by Does not apply route 2 (two) times daily. E 11.9 100 each 12  . Haley George (CRESTOR) 20 MG tablet TAKE 1 TABLET BY MOUTH EVERY DAY (Patient taking differently: Take 20 mg by mouth daily.) 90 tablet 3  . Semaglutide, 1 MG/DOSE, (Haley George, 1 MG/DOSE,) 4 MG/3ML SOPN Inject 1 mg into the skin once a week. 9 mL 3  . sharps container For disposing of insulin pen needles 1 each 2  . valsartan (DIOVAN) 160 MG tablet Take 1 tablet (160 mg total) by mouth daily. 90 tablet 3   No facility-administered medications prior to visit.     Allergies:   Penicillins, Coconut flavor, and Other   Social History   Socioeconomic History  . Marital status: Married    Spouse name: Not on file  . Number of children: 1  . Years of education: Not on file  . Highest education level: Not on file  Occupational History    Employer: [employer]  Tobacco Use  . Smoking status: Never Smoker  . Smokeless tobacco: Never Used  Vaping Use  . Vaping Use: Never used  Substance and Sexual Activity  . Alcohol use: No  . Drug use: No  . Sexual activity: Not Currently  Other Topics Concern  . Not on file  Social History Narrative  . Not on file   Social Determinants of Health   Financial Resource Strain: Not on file  Food Insecurity: Not on file  Transportation Needs: Not on file  Physical Activity: Not on file  Stress: Not on file  Social Connections: Not on file     ROS:   Please see the  history of present illness.    ROS  All other systems are reviewed and are negative.   PHYSICAL EXAM:   VS:  BP 140/88 (BP Location: Left Arm, Patient Position: Sitting, Cuff Size: Normal)   Pulse 86   Ht '5\' 5"'  (1.651 m)   Wt 175 lb (79.4 kg)   BMI 29.12 kg/m      General: Alert, oriented x3, no distress, overweight Head: no evidence of trauma, PERRL, EOMI, no exophtalmos or lid lag, no myxedema, no xanthelasma; normal ears, nose and oropharynx Neck: normal jugular venous pulsations and no hepatojugular reflux; brisk carotid pulses without delay and no carotid bruits Chest: clear to auscultation, no signs of consolidation by percussion or palpation, normal fremitus, symmetrical and full respiratory excursions Cardiovascular: normal position and quality of the apical impulse, regular rhythm, normal first and very distinct second heart sounds, 3/6 aortic ejection murmur early peaking, no diastolic murmurs, rubs or gallops Abdomen: no tenderness or distention, no masses by palpation, no abnormal pulsatility or arterial bruits, normal bowel sounds, no hepatosplenomegaly Extremities: no clubbing, cyanosis or edema; 2+ radial, ulnar and brachial pulses bilaterally; 2+ right femoral, posterior tibial and dorsalis pedis pulses; 2+ left femoral, posterior tibial and dorsalis pedis pulses; no subclavian or femoral bruits Neurological: grossly nonfocal Psych: Normal mood and affect   Wt Readings from Last 3 Encounters:  09/18/20 175 lb (79.4 kg)  08/27/20 179 lb 13.9 oz (81.6 kg)  08/20/20 180 lb (81.6 kg)    Studies/Labs Reviewed:   ECHO 10/30/2019:  1. Left ventricular ejection fraction, by estimation, is 55 to 60%. The  left ventricle has normal function. The left ventricle has no regional  wall motion abnormalities. Left ventricular diastolic parameters are  indeterminate.   2. Right ventricular systolic function is normal. The right ventricular  size is normal. There is normal  pulmonary artery systolic pressure.   3. The mitral valve is normal in structure. Trivial mitral valve  regurgitation. No evidence of mitral stenosis.   4. The aortic valve is tricuspid. Aortic valve regurgitation is not  visualized. Mild aortic valve stenosis.   5. The inferior vena cava is normal in size with greater than 50%  respiratory variability, suggesting right atrial pressure of 3 mmHg.     12/02/2019 ECHO  1. Left ventricular ejection fraction, by estimation, is 55 to 60%. The  left ventricle has normal function. The left ventricle has no regional  wall motion abnormalities.   2. Right ventricular systolic function is normal. The right ventricular  size is normal.   3. The mitral valve is grossly normal. Trivial mitral valve  regurgitation. No evidence of mitral stenosis.   4. The inferior vena cava is normal in size with greater than 50%  respiratory variability, suggesting right atrial pressure of 3 mmHg.   EKG: Not ordered today.  Her ECGs have never really been helpful when she has had acute coronary syndrome, they always look normal  Recent Labs: 12/03/2019: TSH 1.031 08/20/2020: ALT 17 08/28/2020: BUN 26; Creatinine, Ser 1.67; Hemoglobin 10.1; Platelets 195; Potassium 5.0; Sodium 136   Lipid Panel    Component Value Date/Time   CHOL 83 (L) 08/20/2020 1024   TRIG 270 (H) 08/20/2020 1024   HDL 22 (L) 08/20/2020 1024   CHOLHDL 3.8 08/20/2020 1024   CHOLHDL 6.7 12/02/2019 0336   VLDL UNABLE TO CALCULATE IF TRIGLYCERIDE OVER 400 mg/dL 12/02/2019 0336   LDLCALC 21 08/20/2020 1024   LDLDIRECT 93 11/15/2012 0829    ASSESSMENT:    1. Coronary artery disease of native artery of native heart with stable angina pectoris ([place])   2. Mixed hyperlipidemia   3. Aortic valve stenosis, nonrheumatic   4. Essential hypertension   5. Insulin-requiring or dependent type II diabetes mellitus ([place])   6. Overweight   7. Mild persistent asthma without complication   8. Benign  paroxysmal positional vertigo of left ear    PLAN:  In order of problems listed above:  1. CAD:   Symptoms have improved by doubling the dose of isosorbide and she has not yet started the ranolazine.  Has some symptoms suggestive of possible coronary in-stent restenosis, but also other episodes that are more suggestive of vasospastic angina.  Continue carvedilol.  Option to start ranolazine.  She has 1000 mg tablets at home.  If we cannot control her symptoms with medical therapy, would recommend coronary angiography. 2. HLP: She has a highly unfavorable lipid profile.  She has multiple family members with early onset CAD including an uncle who passed away at age 19 and an uncle that required bypass surgery at 75. She is on a very aggressive lipid-lowering therapy with PCSK9 inhibitor, high-dose statin, Haley George, Haley George.  There was some degradation of her glucose control and elevation in triglycerides after her husband passed away, but she seems to making some progress in improving this. 3. AS: She has not had any complaints of exertional dyspnea or syncope and her angina has improved with a higher dose of isosorbide.  The echocardiogram performed on  October 30, 2019 still shows mild aortic stenosis.  We will follow up in 3-5 years unless she develops new symptoms.  No symptoms of aortic stenosis at this time.  Asked her to call for symptoms of exertional dizziness/worsening exertional dyspnea or exertional angina 4. HTN: Controlled. 5. DM: A1c 7.4%. 6. Overweight: BMI 29.  Unchanged from last appointment. 7. Reactive airway disease: Tolerating carvedilol without needing to use her inhaler. 8. BPV: This was severe enough to cause hospitalization for a couple of days in early May, but has resolved.  She is aware of Epley maneuvers.  Usually improves promptly when she starts meclizine.   Medication Adjustments/Labs and Tests Ordered: Current medicines are reviewed at length with the patient today.   Concerns regarding medicines are outlined above.  Medication changes, Labs and Tests ordered today are listed in the Patient Instructions below. Patient Instructions  Medication Instructions:  No changes *If you need a refill on your cardiac medications before your next appointment, please call your pharmacy*   Lab Work: None ordered If you have labs (blood work) drawn today and your tests are completely normal, you will receive your results only by: Marland Kitchen MyChart Message (if you have MyChart) OR . A paper copy in the mail If you have any lab test that is abnormal or we need to change your treatment, we will call you to review the results.   Testing/Procedures: None ordered   Follow-Up: At [facility], you and your health needs are our priority.  As part of our continuing mission to provide you with exceptional heart care, we have created designated Provider Care Teams.  These Care Teams include your primary Cardiologist (physician) and Advanced Practice Providers (APPs -  Physician Assistants and Nurse Practitioners) who all work together to provide you with the care you need, when you need it.  We recommend signing up for the patient portal called "MyChart".  Sign up information is provided on this After Visit Summary.  MyChart is used to connect with patients for Virtual Visits (Telemedicine).  Patients are able to view lab/test results, encounter notes, upcoming appointments, etc.  Non-urgent messages can be sent to your provider as well.   To learn more about what you can do with MyChart, go to [email or web address].    Your next appointment:   3 month(s)  The format for your next appointment:   In Person  Provider:   You may see Haley Klein, MD or one of the following Advanced Practice Providers on your designated Care Team:    Almyra Deforest, PA-C  Fabian Sharp, [place] or   Roby Lofts, PA-C         Signed, Haley Klein, MD  09/18/2020 12:54 PM    [address], [address], [place]  [address] Phone: [phone]; Fax: [phone]

## 2020-09-30 ENCOUNTER — Other Ambulatory Visit: Payer: Self-pay | Admitting: Cardiovascular Disease

## 2020-09-30 DIAGNOSIS — E1169 Type 2 diabetes mellitus with other specified complication: Secondary | ICD-10-CM

## 2020-10-08 ENCOUNTER — Other Ambulatory Visit: Payer: Self-pay

## 2020-10-08 ENCOUNTER — Ambulatory Visit: Payer: BC Managed Care – PPO | Admitting: Family Medicine

## 2020-10-08 ENCOUNTER — Encounter: Payer: Self-pay | Admitting: Family Medicine

## 2020-10-08 VITALS — BP 128/78 | HR 92 | Temp 97.8°F | Wt 178.0 lb

## 2020-10-08 DIAGNOSIS — R42 Dizziness and giddiness: Secondary | ICD-10-CM | POA: Diagnosis not present

## 2020-10-08 DIAGNOSIS — I350 Nonrheumatic aortic (valve) stenosis: Secondary | ICD-10-CM

## 2020-10-08 MED ORDER — MECLIZINE HCL 25 MG PO TABS: 25.0000 mg | ORAL_TABLET | Freq: Three times a day (TID) | ORAL | 1 refills | Status: DC | PRN

## 2020-10-08 NOTE — Progress Notes (Signed)
   Subjective:    Patient ID: Haley George, female    DOB: [birth year], 65 y.o.   MRN: [id number]  HPI She is here for a follow-up on hospitalization that occurred on May 5.  The hospital discharge summary was reviewed.  She did have difficulty with vertigo, nausea and vomiting and an acute kidney injury.  She was given fluids, Zofran and meclizine.  She did not follow-up with me till today.  She states that she did fairly well but within the last several days she describes what she calls fogginess however is difficult to get exactly what she meant by that that she then mentioned the dizziness again.  This seems to be her main concern.  She continues on other medications listed in her chart.   Review of Systems     Objective:   Physical Exam Alert and in no distress. Tympanic membranes and canals are normal. Pharyngeal area is normal. Neck is supple without adenopathy or thyromegaly. Cardiac exam shows a regular sinus rhythm with a 3/6 SEM; lungs are clear to auscultation.  No carotid bruits noted.        Assessment & Plan:  Vertigo - Plan: meclizine (ANTIVERT) 25 MG tablet  Aortic valve stenosis, etiology of cardiac valve disease unspecified I think her symptoms are mainly vertigo related and I will get more aggressive with treating the vertigo with meclizine for a longer period of time.  If she continues have difficulty with this, further evaluation and referral will be made.  She will follow-up with her blood studies when she sees her endocrinologist in the near future.

## 2020-11-16 ENCOUNTER — Ambulatory Visit: Payer: BC Managed Care – PPO | Admitting: Family Medicine

## 2020-11-16 ENCOUNTER — Other Ambulatory Visit: Payer: Self-pay

## 2020-11-16 ENCOUNTER — Encounter: Payer: Self-pay | Admitting: Family Medicine

## 2020-11-16 VITALS — BP 130/80 | HR 89 | Temp 98.5°F | Wt 179.4 lb

## 2020-11-16 DIAGNOSIS — R399 Unspecified symptoms and signs involving the genitourinary system: Secondary | ICD-10-CM | POA: Diagnosis not present

## 2020-11-16 DIAGNOSIS — N3001 Acute cystitis with hematuria: Secondary | ICD-10-CM | POA: Diagnosis not present

## 2020-11-16 LAB — POCT URINALYSIS DIP (CLINITEK)
Bilirubin, UA: NEGATIVE
Glucose, UA: NEGATIVE mg/dL
Ketones, POC UA: NEGATIVE mg/dL
Nitrite, UA: NEGATIVE
Spec Grav, UA: >=1.03 — AB (ref 1.010–1.025)
Urobilinogen, UA: 0.2 E.U./dL
pH, UA: 6 (ref 5.0–8.0)

## 2020-11-16 MED ORDER — NITROFURANTOIN MONOHYD MACRO 100 MG PO CAPS: 100.0000 mg | ORAL_CAPSULE | Freq: Two times a day (BID) | ORAL | 0 refills | Status: DC

## 2020-11-16 NOTE — Addendum Note (Signed)
Addended byLinus Salmons, Frederic Tones D on: 11/16/2020 12:07 PM   Modules accepted: Orders

## 2020-11-16 NOTE — Progress Notes (Signed)
   Subjective:    Patient ID: Haley George, female    DOB: 08-Jul-1955, 65 y.o.   MRN: [id number]  HPI She is here for recheck on urinary tract infection.  She was treated in April with Bactrim but only took 5 days because of surgical procedure.  She then had difficulty with that UTI symptoms in mid July and self treated herself with Bactrim however needed to go to an urgent care.  She was seen on July 16 in an urgent care and given Macrobid but only given 5 days.  She stated it did help but when she finishes the antibiotic, her symptoms of frequency and dysuria reoccurred.  Apparently in the urgent care they did note blood in her urine.  Review of Systems     Objective:   Physical Exam Alert and in no distress otherwise not examined Urine dipstick was positive and the microscopic did show red and white cells.       Assessment & Plan:  UTI symptoms - Plan: nitrofurantoin, macrocrystal-monohydrate, (MACROBID) 100 MG capsule, Urine Culture  Acute cystitis with hematuria I will place her back on Macrobid but get a culture to make sure what we are dealing with and she will need to return here in 2 weeks to make sure that the hematuria does clear up.  She was comfortable with that.

## 2020-11-19 LAB — URINE CULTURE

## 2020-11-20 ENCOUNTER — Telehealth: Payer: Self-pay

## 2020-11-20 NOTE — Telephone Encounter (Signed)
Pt called for her U/C results,  they were given

## 2020-11-20 NOTE — Progress Notes (Signed)
Lvm for pt to call back if she has any questions . [place]

## 2020-11-30 ENCOUNTER — Ambulatory Visit: Payer: BC Managed Care – PPO | Admitting: Family Medicine

## 2020-11-30 ENCOUNTER — Encounter: Payer: Self-pay | Admitting: Family Medicine

## 2020-11-30 ENCOUNTER — Other Ambulatory Visit: Payer: Self-pay

## 2020-11-30 VITALS — BP 130/70 | HR 105 | Temp 96.8°F | Wt 182.2 lb

## 2020-11-30 DIAGNOSIS — R809 Proteinuria, unspecified: Secondary | ICD-10-CM

## 2020-11-30 DIAGNOSIS — N3001 Acute cystitis with hematuria: Secondary | ICD-10-CM

## 2020-11-30 DIAGNOSIS — E119 Type 2 diabetes mellitus without complications: Secondary | ICD-10-CM | POA: Diagnosis not present

## 2020-11-30 DIAGNOSIS — R399 Unspecified symptoms and signs involving the genitourinary system: Secondary | ICD-10-CM

## 2020-11-30 DIAGNOSIS — Z794 Long term (current) use of insulin: Secondary | ICD-10-CM

## 2020-11-30 LAB — POCT URINALYSIS DIP (PROADVANTAGE DEVICE)
Blood, UA: NEGATIVE
Glucose, UA: NEGATIVE mg/dL
Nitrite, UA: NEGATIVE
Protein Ur, POC: 100 mg/dL — AB
Specific Gravity, Urine: 1.03
Urobilinogen, Ur: 0.2
pH, UA: 6 (ref 5.0–8.0)

## 2020-11-30 NOTE — Progress Notes (Addendum)
   Subjective:    Patient ID: Haley George, female    DOB: November 29, 1955, 65 y.o.   MRN: [id number]  HPI She is here for recheck on her urinalysis.  She presently is asymptomatic.   Review of Systems     Objective:   Physical Exam Alert and in no distress.  Urine dipstick is recorded and does show protein.       Assessment & Plan:   Acute cystitis with hematuria - Plan: POCT Urinalysis DIP (Proadvantage Device)  UTI symptoms  Proteinuria, unspecified type  Insulin-requiring or dependent type II diabetes mellitus ([place]) No evidence of UTI but her protein is elevated.  I do not see a recent AC ratio and will check into doing 1. A/C ratio was >210

## 2020-12-04 ENCOUNTER — Telehealth: Payer: Self-pay | Admitting: Family Medicine

## 2020-12-04 DIAGNOSIS — R399 Unspecified symptoms and signs involving the genitourinary system: Secondary | ICD-10-CM

## 2020-12-04 MED ORDER — NITROFURANTOIN MONOHYD MACRO 100 MG PO CAPS: 100.0000 mg | ORAL_CAPSULE | Freq: Two times a day (BID) | ORAL | 0 refills | Status: DC

## 2020-12-04 NOTE — Telephone Encounter (Signed)
Pt was in on Monday for UTI which had cleared but called today and stated that she woke up this morning with the same symptoms again. Burning, urgency, and cloudy urine. She has a busy afternoon and wont be able to come in today. She can be reached at [phone]

## 2020-12-08 ENCOUNTER — Encounter: Payer: Self-pay | Admitting: Internal Medicine

## 2020-12-08 ENCOUNTER — Ambulatory Visit: Payer: BC Managed Care – PPO | Admitting: Internal Medicine

## 2020-12-08 ENCOUNTER — Other Ambulatory Visit: Payer: Self-pay

## 2020-12-08 VITALS — BP 150/90 | HR 80 | Ht 65.0 in | Wt 179.2 lb

## 2020-12-08 DIAGNOSIS — E118 Type 2 diabetes mellitus with unspecified complications: Secondary | ICD-10-CM | POA: Diagnosis not present

## 2020-12-08 DIAGNOSIS — E1159 Type 2 diabetes mellitus with other circulatory complications: Secondary | ICD-10-CM | POA: Diagnosis not present

## 2020-12-08 DIAGNOSIS — Z6834 Body mass index (BMI) 34.0-34.9, adult: Secondary | ICD-10-CM

## 2020-12-08 DIAGNOSIS — E1169 Type 2 diabetes mellitus with other specified complication: Secondary | ICD-10-CM

## 2020-12-08 DIAGNOSIS — Z794 Long term (current) use of insulin: Secondary | ICD-10-CM | POA: Diagnosis not present

## 2020-12-08 DIAGNOSIS — E6609 Other obesity due to excess calories: Secondary | ICD-10-CM | POA: Diagnosis not present

## 2020-12-08 DIAGNOSIS — E785 Hyperlipidemia, unspecified: Secondary | ICD-10-CM

## 2020-12-08 LAB — POCT GLYCOSYLATED HEMOGLOBIN (HGB A1C): Hemoglobin A1C: 6.3 % — AB (ref 4.0–5.6)

## 2020-12-08 MED ORDER — LEVEMIR FLEXTOUCH 100 UNIT/ML ~~LOC~~ SOPN: 50.0000 [IU] | PEN_INJECTOR | Freq: Every day | SUBCUTANEOUS | 3 refills | Status: DC

## 2020-12-08 MED ORDER — METFORMIN HCL 1000 MG PO TABS: 1000.0000 mg | ORAL_TABLET | Freq: Two times a day (BID) | ORAL | 3 refills | Status: DC

## 2020-12-08 NOTE — Addendum Note (Signed)
Addended by: Lauralyn Primes on: 12/08/2020 10:44 AM   Modules accepted: Orders

## 2020-12-08 NOTE — Patient Instructions (Signed)
Please continue: - Metformin 1000 mg 2x a day with meals - Glipizide 2.5 to 5 mg before a large meal - Levemir 50 units at bedtime - Ozempic 0.75 mg weekly  Please come back for a follow-up appointment in 4 months.

## 2020-12-08 NOTE — Progress Notes (Signed)
Patient ID: Haley George, female   DOB: November 04, 1955, 65 y.o.   MRN: [id number]   This visit occurred during the SARS-CoV-2 public health emergency.  Safety protocols were in place, including screening questions prior to the visit, additional usage of staff PPE, and extensive cleaning of exam room while observing appropriate contact time as indicated for disinfecting solutions.   HPI: Haley George is a 65 y.o.-year-old female, initially referred by her PCP, Dr. Redmond School, returning for follow-up for DM2, dx initially in 1997 2/2 liver failure, then GDM, then re-occuring in 2007, insulin-dependent, uncontrolled, with long-term complications (CAD-s/p NSTEMI in 11/2019, s/p stents, aortic valve stenosis; CKD).  Last visit 4 mo ago.  Interim history: No blurry vision, chest pain. She had some nausea which improved after decreasing Ozemoic dose. She had vertigo >> improved on Meclizine. She has UTIs >> drinks diluted cranberry juice. She is trying to adjust to living without her husband who passed away unexpectedly a year ago.  Reviewed HbA1c levels: Lab Results  Component Value Date   HGBA1C 7.4 (H) 08/20/2020   HGBA1C 7.0 (A) 07/28/2020   HGBA1C 6.2 (A) 01/28/2020   HGBA1C 6.6 (H) 12/02/2019   HGBA1C 6.4 (H) 10/22/2019   HGBA1C 6.3 (A) 01/29/2019   HGBA1C 9.2 (A) 08/30/2018   HGBA1C 8.8 (A) 03/27/2018   HGBA1C 9.2 (A) 12/19/2017   HGBA1C 7.3 03/13/2017  05/01/2019: HbA1c 6.2% She was on steroid injections in 2019.  Pt is on a regimen of: - Metformin 1000 mg 2x a day, with meals - Glipizide 2.5-5 mg before a meal - added 01/2019 -before a large meals - Levemir 140 (70 units x2) units at bedtime >> 70 units >> ...45 >> 50 units at bedtime   - Ozempic 0.5 mg weekly-added 09/2018 >> 1 mg weekly >> decreased to 0.75 mg weekly 4 weeks ago due to gastric discomfort/nausea She was on Actos before.  Pt checks her sugars 2x a day: - am: 91-126 >> 79, 81-101 >> 83-96, 132, 137 >> 131-162, 180 >>  98-125 - 2h after b'fast: n/c >> 129-148 >> n/c >> 163-188, 218 >> 140, 168 - before lunch: n/c >> 160, 183 >> 139, 146 - 2h after lunch:  138-148 >> 102, 149, 183 (no glipizide) >> 190 >> n/c - before dinner: n/c >> 142-173 >> 131-162 - 2h after dinner:  132-161, 168 >> 149, 170, 182 >> 194-219 >> 129-168 - bedtime: n/c >> 99-137, 152 >> 146-192 >> n/c - nighttime: n/c Lowest sugar was 40 - 2012 >> ...  79 >> 89 >> 131 >> 98; she has hypoglycemia awareness in the 70s. Highest sugar was 368 ... >> 183 >> 219 >> 168.  Glucometer: ReliOn  Pt's meals are: - Breakfast: if not skipping - egg + whole wheat toast; cheerios + milk - Lunch: chicken salad, apple, veggies, trisquits  - Dinner: meat + 2 veggies +/- rolls  In the past, she was on Cool for autoimmune hepatitis.  She could not tolerate it and had to stop.  She lost 24 pounds on it.  -+ CKD, last BUN/creatinine:  Lab Results  Component Value Date   BUN 26 (H) 08/28/2020   BUN 25 (H) 08/27/2020   CREATININE 1.67 (H) 08/28/2020   CREATININE 1.90 (H) 08/27/2020  On Diovan 160.  -+ HL; last set of lipids: Lab Results  Component Value Date   CHOL 83 (L) 08/20/2020   HDL 22 (L) 08/20/2020   LDLCALC 21 08/20/2020   LDLDIRECT  93 11/15/2012   TRIG 270 (H) 08/20/2020   CHOLHDL 3.8 08/20/2020  On Crestor 20, fenofibrate, Vascepa.  Also on Praluent started 12/2018.  - last eye exam was in 03/2020: No DR reportedly. Dr. Katy Fitch.   - no numbness and tingling in her feet.  Pt has FH of DM in M, B, M uncle, MGM.  She was admitted for chest pain 10/2018 after an episode of dehydration and had heart catheterization at that time >> has another stent and the previous stent was recanalized.  She has a history of autoimmune hepatitis. Also, psoriasis, diagnosed as a child.  She lost a signif. amount of weight on [employer] in 2018.    ROS: Constitutional: + weight gain/no weight loss, no fatigue, no subjective hyperthermia, no  subjective hypothermia Eyes: no blurry vision, no xerophthalmia ENT: no sore throat, no nodules palpated in neck, no dysphagia, no odynophagia, no hoarseness Cardiovascular: no CP/no SOB/no palpitations/no leg swelling Respiratory: no cough/no SOB/no wheezing Gastrointestinal: + N/no V/no D/no C/no acid reflux Musculoskeletal: no muscle aches/no joint aches Skin: no rashes, no hair loss Neurological: no tremors/no numbness/no tingling/no dizziness  I reviewed pt's medications, allergies, PMH, social hx, family hx, and changes were documented in the history of present illness. Otherwise, unchanged from my initial visit note.  Past Medical History:  Diagnosis Date   Asthma    Cancer ([place])    basal cell chest   Complication of anesthesia    Coronary artery disease    Diabetes mellitus without complication ([place])    type 2   Heart murmur    Hepatitis    auto- immune   History of kidney stones    History of nuclear stress test 05/21/2010   dipyridamole; normal perfusion, preserved LV systolic EF of 11%   Hyperlipidemia    Hypertension    Liver failure ([place])    h/o autoimmune hepatitis    Myocardial infarction ([place])    Neuropathy    resolved with back surgery; tingling and burning left leg to foot   Obesity    PONV (postoperative nausea and vomiting)    Psoriasis    Tubular adenoma of colon    colon path 01/09/2017   Past Surgical History:  Procedure Laterality Date   Carotid Doppler  10/2008   R & L ICAs 0-49% diameter reduction    COLONOSCOPY     CORONARY ANGIOPLASTY WITH STENT PLACEMENT  09/2008   2.25x99m Cypher DES to in-stent restenosis of prox LAD; Mini-Vision stent x2 2.0x142mto area distal of initial LAD stent; 3 2.5x1565maxus stents prox to Cypher stent in circumflex   CORONARY ANGIOPLASTY WITH STENT PLACEMENT  09/2005   Cypher DES 3.0x22m41m distal RCA; 2.25x12mm48mi0Vision stent to mid LAD; 2.5x18mm 19mer stent to circumflex   CORONARY STENT INTERVENTION N/A  11/13/2018   Procedure: CORONARY STENT INTERVENTION;  Surgeon: VaranaJettie Booze [facility];  Service: Cardiovascular;  Laterality: N/A;   CORONARY STENT INTERVENTION N/A 12/02/2019   Procedure: CORONARY STENT INTERVENTION;  Surgeon: End, CNelva Bush [facility];  Service: Cardiovascular;  Laterality: N/A;   LEFT HEART CATH AND CORONARY ANGIOGRAPHY N/A 11/13/2018   Procedure: LEFT HEART CATH AND CORONARY ANGIOGRAPHY;  Surgeon: VaranaJettie Booze [facility];  Service: Cardiovascular;  Laterality: N/A;   LEFT HEART CATH AND CORONARY ANGIOGRAPHY N/A 12/02/2019   Procedure: LEFT HEART CATH AND CORONARY ANGIOGRAPHY;  Surgeon: End, CNelva Bush  Location: [facility];  Service: Cardiovascular;  Laterality: N/A;   LUMBAR LAMINECTOMY/DECOMPRESSION MICRODISCECTOMY Left 10/18/2016   Procedure: Left Lumbar Four-Five Microdiscectomy;  Surgeon: Erline Levine, MD;  Location: [facility];  Service: Neurosurgery;  Laterality: Left;  Left L4-5 Microdiscectomy   ROTATOR CUFF REPAIR Left    SHOULDER ARTHROSCOPY W/ ROTATOR CUFF REPAIR Right    TRANSTHORACIC ECHOCARDIOGRAM  04/2010   EF=>55%, borderline conc LVH; trace MR & TR; AV mildly sclerotic; aortic root sclerosis/calcif   TRIGGER FINGER RELEASE Left 02/19/2020   Procedure: RELEASE TRIGGER FINGER/A-1 PULLEY;  Surgeon: Earlie Server, MD;  Location: [facility];  Service: Orthopedics;  Laterality: Left;   ureteral stents     Social History   Socioeconomic History   Marital status: Single    Spouse name: Not on file   Number of children: 1   Years of education: Not on file   Highest education level: Not on file  Occupational History    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  Social Needs   Financial resource strain: Not on file   Food insecurity    Worry: Not on file    Inability: Not on file   Transportation needs    Medical: Not on file     Non-medical: Not on file  Tobacco Use   Smoking status: Never Smoker   Smokeless tobacco: Never Used  Substance and Sexual Activity   Alcohol use: No   Drug use: No   Sexual activity: Not Currently  Lifestyle   Physical activity    Days per week: Not on file    Minutes per session: Not on file   Stress: Not on file  Relationships   Social connections    Talks on phone: Not on file    Gets together: Not on file    Attends religious service: Not on file    Active member of club or organization: Not on file    Attends meetings of clubs or organizations: Not on file    Relationship status: Not on file   Intimate partner violence    Fear of current or ex partner: Not on file    Emotionally abused: Not on file    Physically abused: Not on file    Forced sexual activity: Not on file  Other Topics Concern   Not on file  Social History Narrative   Not on file   Current Outpatient Medications on File Prior to Visit  Medication Sig Dispense Refill   Alirocumab (PRALUENT) 150 MG/ML SOAJ Inject 150 mg into the skin every 14 (fourteen) days. 6 mL 3   aspirin EC 81 MG tablet Take 1 tablet (81 mg total) by mouth daily.     BD PEN NEEDLE NANO 2ND GEN 32G X 4 MM MISC USE AS DIRECTED FOR INSULIN ADMINISTRATION (Patient taking differently: 1 each by Other route as directed. For insulin administration) 100 each 3   Blood Glucose Monitoring Suppl (ONETOUCH VERIO) w/Device KIT Use to check blood sugar 2 times a day (Patient taking differently: 1 each by Other route See admin instructions. Use to check blood sugar 2 times a day) 1 kit 0   BREO ELLIPTA 200-25 MCG/INH AEPB Inhale 1 puff into the lungs at bedtime.      carvedilol (COREG) 6.25 MG tablet TAKE 1 TABLET BY MOUTH TWICE A DAY (Patient taking differently: Take 6.25 mg by mouth 2 (two) times daily.) 180 tablet 1   clopidogrel (PLAVIX) 75 MG tablet TAKE  1 TABLET (75 MG TOTAL) BY MOUTH DAILY WITH BREAKFAST. (Patient taking differently: Take 75  mg by mouth daily.) 90 tablet 2   Coenzyme Q10 300 MG CAPS Take 300 mg by mouth daily.     Cranberry 300 MG tablet Take 300 mg by mouth every evening.     fenofibrate (TRICOR) 145 MG tablet TAKE 1 TABLET (145 MG TOTAL) BY MOUTH EVERY EVENING. 90 tablet 1   fluconazole (DIFLUCAN) 150 MG tablet Take 1 tablet (150 mg total) by mouth daily. Take 1 tablet today. Take 1 tablet in 1 week. 2 tablet 0   glipiZIDE (GLUCOTROL) 5 MG tablet Take 0.5-1 tablets (2.5-5 mg total) by mouth daily before supper. (Patient taking differently: Take 2.5 mg by mouth as needed (for big meals).) 90 tablet 3   Glucosamine-Chondroit-Vit C-Mn (GLUCOSAMINE CHONDR 1500 COMPLX PO) Take 1 tablet by mouth daily.     glucose blood (ONETOUCH VERIO) test strip Use to check blood sugar 2 times a day (Patient taking differently: 1 each by Other route See admin instructions. Use to check blood sugar 2 times a day) 200 each 12   icosapent Ethyl (VASCEPA) 1 g capsule Take 2 capsules (2 g total) by mouth 2 (two) times daily. 360 capsule 3   insulin detemir (LEVEMIR FLEXTOUCH) 100 UNIT/ML FlexPen Inject 45 Units into the skin at bedtime. (Patient taking differently: Inject 50 Units into the skin at bedtime.) 45 mL 3   isosorbide mononitrate (IMDUR) 60 MG 24 hr tablet Take 1 tablet (60 mg total) by mouth daily. ** DO NOT CRUSH ** 90 tablet 3   meclizine (ANTIVERT) 25 MG tablet Take 1 tablet (25 mg total) by mouth 3 (three) times daily as needed for dizziness. 30 tablet 1   metFORMIN (GLUCOPHAGE) 1000 MG tablet Take 1 tablet (1,000 mg total) by mouth 2 (two) times daily with a meal. 180 tablet 3   Multiple Vitamins-Minerals (CENTRUM SILVER 50+WOMEN PO) Take 1 tablet by mouth daily.     nitrofurantoin, macrocrystal-monohydrate, (MACROBID) 100 MG capsule Take 1 capsule (100 mg total) by mouth 2 (two) times daily. 20 capsule 0   nitroGLYCERIN (NITROSTAT) 0.4 MG SL tablet PLACE 1 TABLET (0.4 MG TOTAL) UNDER THE TONGUE EVERY 5 (FIVE) MINUTES AS NEEDED  FOR CHEST PAIN (UP TO 3 DOSES). 75 tablet 1   ondansetron (ZOFRAN ODT) 4 MG disintegrating tablet Take 1 tablet (4 mg total) by mouth every 8 (eight) hours as needed for nausea or vomiting. 20 tablet 0   ranolazine (RANEXA) 1000 MG SR tablet Take 1 tablet (1,000 mg total) by mouth 2 (two) times daily. (Patient not taking: No sig reported) 180 tablet 3   ReliOn Lancets Thin 26G MISC 1 Units by Does not apply route 2 (two) times daily. E 11.9 100 each 12   rosuvastatin (CRESTOR) 20 MG tablet TAKE 1 TABLET BY MOUTH EVERY DAY 90 tablet 3   Semaglutide, 1 MG/DOSE, (OZEMPIC, 1 MG/DOSE,) 4 MG/3ML SOPN Inject 1 mg into the skin once a week. 9 mL 3   sharps container For disposing of insulin pen needles 1 each 2   valsartan (DIOVAN) 160 MG tablet Take 1 tablet (160 mg total) by mouth daily. 90 tablet 3   No current facility-administered medications on file prior to visit.   Allergies  Allergen Reactions   Penicillins Anaphylaxis and Hives     PATIENT HAD A PCN REACTION WITH IMMEDIATE RASH, FACIAL/TONGUE/THROAT SWELLING, SOB, OR LIGHTHEADEDNESS WITH HYPOTENSION:  #  #  #  YES  #  #  #   Has patient had a PCN reaction causing severe rash involving mucus membranes or skin necrosis:Unknown Has patient had a PCN reaction that required hospitalization:No Has patient had a PCN reaction occurring within the last 10 years:No If all of the above answers are "NO", then may proceed with Cephalosporin use.    Coconut Flavor Swelling    Mouth breaks out    Other Swelling    Walnuts cause mouth to break out   Family History  Problem Relation Age of Onset   CAD Mother    CAD Father    Diabetes Brother    PE: BP (!) 150/90 (BP Location: Right Arm, Patient Position: Sitting, Cuff Size: Normal)   Pulse 80   Ht _0  (1.651 m)   Wt 179 lb 3.2 oz (81.3 kg)   SpO2 98%   BMI 29.82 kg/m  Wt Readings from Last 3 Encounters:  12/08/20 179 lb 3.2 oz (81.3 kg)  11/30/20 182 lb 3.2 oz (82.6 kg)  11/16/20 179  lb 6.4 oz (81.4 kg)   Constitutional: overweight, in NAD Eyes: PERRLA, EOMI, no exophthalmos ENT: moist mucous membranes, no thyromegaly, no cervical lymphadenopathy Cardiovascular: RRR, No RG, +2/6 SEM Respiratory: CTA B Gastrointestinal: abdomen soft, NT, ND, BS+ Musculoskeletal: no deformities, strength intact in all 4 Skin: moist, warm, no rashes Neurological: no tremor with outstretched hands, DTR normal in all 4  ASSESSMENT: 1. DM2, insulin-dependent, uncontrolled, with long-term complications - CAD-s/p AMI, s/p stents, aortic valve stenosis - CKD  2. HL  3.  Obesity class I  PLAN:  1. Patient with longstanding, uncontrolled, type is, on oral antidiabetic regimen with metformin and sulfonylurea, and also on basal insulin and weekly GLP-1 receptor agonist, increased at last visit.  In the past, she wanted to avoid SGLT2 inhibitors due to possible increased urination.  She does have a history of UTI sand yeast on wet prep in the last year.  At last visit, sugars are higher due to erratic meals and increased stress.  She was also occasionally forgetting glipizide before dinner.  We discussed about moving dinners earlier and not snack after dinner but we also increase Ozempic to 1 mg weekly at last visit.  HbA1c was 7.4% at last check, increased from 6.2%. -At today's visit, sugars appear to be fairly well controlled.  She does drink regular cranberry juice, which she dilutes, and I believe that this increases some of her blood sugars.  They are not exceedingly high, though, and they appear improved compared to last visit.  This is despite her decreasing the dose of Ozempic to 0.75 mg (using the 1 mg pen).  She had to do this due to GI discomfort/nausea.  She is doing better on the lower dose.  Based on her blood sugars, I do not feel we absolutely need to increase it back.  She tells me that she occasionally forgets glipizide when she eats out but trying to work on remembering this. - I  suggested to:  Patient Instructions  Please continue: - Metformin 1000 mg 2x a day with meals - Glipizide 2.5 to 5 mg before a large meal - Levemir 50 units at bedtime - Ozempic 0.75 mg weekly  Please come back for a follow-up appointment in 4 months.  - we checked her HbA1c: 6.3% (lower) - advised to check sugars at different times of the day - 1-2x a day, rotating check times - advised for yearly eye exams >>  she is UTD - return to clinic in 4 months  2. HL -Reviewed latest lipid panel from 07/2020: Triglycerides elevated, but improved, HDL low, LDL quite low: Lab Results  Component Value Date   CHOL 83 (L) 08/20/2020   HDL 22 (L) 08/20/2020   LDLCALC 21 08/20/2020   LDLDIRECT 93 11/15/2012   TRIG 270 (H) 08/20/2020   CHOLHDL 3.8 08/20/2020  -She continues on Crestor 20 mg daily, fenofibrate 145 mg daily, Vascepa 2 g twice daily and Praluent 150 mg every 2 weeks-no side effects  3.  Obesity class I -We will continue Ozempic which should also help with weight loss -She gained 9 pounds before last visit, lost 2 lbs since then  [name], MD PhD [facility] Endocrinology

## 2020-12-10 ENCOUNTER — Other Ambulatory Visit: Payer: Self-pay | Admitting: Cardiovascular Disease

## 2021-01-01 ENCOUNTER — Other Ambulatory Visit: Payer: Self-pay | Admitting: Cardiovascular Disease

## 2021-01-08 ENCOUNTER — Encounter: Payer: Self-pay | Admitting: Cardiovascular Disease

## 2021-01-08 ENCOUNTER — Ambulatory Visit: Payer: BC Managed Care – PPO | Admitting: Cardiovascular Disease

## 2021-01-08 ENCOUNTER — Other Ambulatory Visit: Payer: Self-pay

## 2021-01-08 VITALS — BP 162/100 | HR 86 | Ht 64.25 in | Wt 181.2 lb

## 2021-01-08 DIAGNOSIS — E119 Type 2 diabetes mellitus without complications: Secondary | ICD-10-CM

## 2021-01-08 DIAGNOSIS — E669 Obesity, unspecified: Secondary | ICD-10-CM

## 2021-01-08 DIAGNOSIS — I10 Essential (primary) hypertension: Secondary | ICD-10-CM

## 2021-01-08 DIAGNOSIS — J4530 Mild persistent asthma, uncomplicated: Secondary | ICD-10-CM | POA: Diagnosis not present

## 2021-01-08 DIAGNOSIS — I350 Nonrheumatic aortic (valve) stenosis: Secondary | ICD-10-CM

## 2021-01-08 DIAGNOSIS — Z794 Long term (current) use of insulin: Secondary | ICD-10-CM

## 2021-01-08 DIAGNOSIS — I25118 Atherosclerotic heart disease of native coronary artery with other forms of angina pectoris: Secondary | ICD-10-CM

## 2021-01-08 DIAGNOSIS — N1832 Chronic kidney disease, stage 3b: Secondary | ICD-10-CM

## 2021-01-08 DIAGNOSIS — E782 Mixed hyperlipidemia: Secondary | ICD-10-CM | POA: Diagnosis not present

## 2021-01-08 MED ORDER — BISOPROLOL FUMARATE 10 MG PO TABS
10.0000 mg | ORAL_TABLET | Freq: Every day | ORAL | 1 refills | Status: DC
Start: 2021-01-08 — End: 2021-06-10

## 2021-01-08 NOTE — Patient Instructions (Signed)
Medication Instructions:  STOP the Carvedilol  START Bisoprolol 10 mg once daily TAKE: Magnesium Oxide 400 mg once daily as needed for muscle cramps  *If you need a refill on your cardiac medications before your next appointment, please call your pharmacy*   Lab Work: None ordered If you have labs (blood work) drawn today and your tests are completely normal, you will receive your results only by: [place] (if you have MyChart) OR A paper copy in the mail If you have any lab test that is abnormal or we need to change your treatment, we will call you to review the results.   Testing/Procedures: None ordered   Follow-Up: At [facility], you and your health needs are our priority.  As part of our continuing mission to provide you with exceptional heart care, we have created designated Provider Care Teams.  These Care Teams include your primary Cardiologist (physician) and Advanced Practice Providers (APPs -  Physician Assistants and Nurse Practitioners) who all work together to provide you with the care you need, when you need it.  We recommend signing up for the patient portal called "MyChart".  Sign up information is provided on this After Visit Summary.  MyChart is used to connect with patients for Virtual Visits (Telemedicine).  Patients are able to view lab/test results, encounter notes, upcoming appointments, etc.  Non-urgent messages can be sent to your provider as well.   To learn more about what you can do with MyChart, go to [email or web address].    Your next appointment:   6 month(s)  The format for your next appointment:   In Person  Provider:   Sanda Klein, MD

## 2021-01-08 NOTE — Progress Notes (Signed)
Patient ID: Haley George, female   DOB: 1956/04/20, 65 y.o.   MRN: [id number]    Cardiology Office Note    Date:  01/11/2021   ID:  Haley George, DOB [birth year], MRN [id number]  PCP:  Denita Lung, MD  Cardiologist:   Sanda Klein, MD   Chief Complaint  Patient presents with   Coronary Artery Disease  CAD  History of Present Illness:  Haley George is a 65 y.o. female with diabetes mellitus type II, severe mixed hyperlipidemia, previous percutaneous revascularization procedures performed in all 3 major coronary arteries and restenosis requiring repeat PCI, mild calcific aortic stenosis.  Most recent intervention was placement of 2 drug-eluting stents in the mid-distal left circumflex coronary artery covering a de novo mid vessel lesion and a distal in-stent restenosis lesion in the setting of small NSTEMI (peak troponin 1818) on 12/02/2019.  She is generally done well since her last appointment.  She occasionally has episodes of an uneasy sensation in her chest, not with activity but at rest.  It always worries her that it might be angina pectoris, but she has not actually had to take nitroglycerin.  She has been having some worsening wheezing, that can occur both at rest or with activity.  She does not have orthopnea or PND.  She is using the Breo twice daily (and knows she should never take it more than).  She does not have an albuterol rescue inhaler.  Emotionally, she is having a much harder time.  Its been a year since Haley George, her husband, passed away.  She has financial concerns and is rather lonely, although she remains engaged with her friends, neighbors and church.  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  Haley George, their daughter, is also seeking counseling for her issues with PTSD, after doing CPR on her dad and witnessing his demise.  She has not had any really bad episodes of vertigo as the one that occurred after her last appointment, but still has  some periods of imbalance and dizziness.  Its been just over a year since the placement of her last pair of drug-eluting stents.  She does have a history of in-stent restenosis.  She denies edema, orthopnea, PND, claudication, focal neurological events other than the vertigo, syncope or palpitations.  She has nocturnal muscle cramps but denies intermittent claudication, asks whether she should take a potassium supplement.  She continues to make outstanding progress with her metabolic parameters and her most recent hemoglobin A1c was 6.3% on 12/08/2020.  She continues to take insulin (Levemir, but is also on metformin and Ozempic.  Her weight continues to help her at the border between overweight and obese with a BMI of 30.  Her LDL cholesterol is outstanding at 21 on a combination of rosuvastatin and Praluent.  Unfortunately, her HDL also remains very low at 22 and her triglycerides are little bit high at 270.  She is really on excellent, state-of-the-art regimen for her metabolic problems with [name], Ozempic, Praluent, rosuvastatin, Vascepa, as well as levemir and fenofibrate.  She has a prescription for glyburide "as needed" which she rarely takes, when she has large meals.  She developed unstable angina and she underwent cardiac catheterization on December 02, 2019.  The culprit lesion was found to be a 95% in-stent restenosis in the mid left circumflex coronary artery, as well as a de novo 70% mid left circumflex stenosis at the OM2 bifurcation.  This was treated by placement of  overlapping 2.5x12 and 2.25x20 drug-eluting Synergy stents (Dr. Saunders Revel).  Also noted was a mild-moderate mid to distal LAD in-stent restenosis and an unchanged 80% RPDA lesion.  She had complete relief of her angina following this intervention.      Previous coronary interventions 2007  - mid LAD 2.2512 mini vision - left circumflex 2.518 Cypher  - RCA 3.023 Cypher  2010  - mid LAD 2.2512 Taxus overlapping 2.012 mini  vision, overlapping another 2.012 mini vision -  left circumflex 2.516 Taxus upstream of previous stent.  July 2020  - mid LAD Synergy 2.5 x12 , overlapping the proximal area of restenosis in the mid LAD (short area of 3 stent layers).  August 2021 - mid LCX overlapping 2.5x12 and 2.25x20 drug-eluting Synergy (the distal stents overlaps the previously placed Taxus stent from 2010)  She had a normal nuclear stress test in November 2016. EF was 56%. She had a "false positive" ECG response.  In July 2020 she had a moderate sized area of ischemia in the anteroseptal wall, EF 52%, Prior to placement of the mid LAD stent.  Echo in August 2021 showed normal left ventricular regional motion and EF 55-60%.  Past Medical History:  Diagnosis Date   Asthma    Cancer ([place])    basal cell chest   Complication of anesthesia    Coronary artery disease    Diabetes mellitus without complication ([place])    type 2   Heart murmur    Hepatitis    auto- immune   History of kidney stones    History of nuclear stress test 05/21/2010   dipyridamole; normal perfusion, preserved LV systolic EF of 79%   Hyperlipidemia    Hypertension    Liver failure ([place])    h/o autoimmune hepatitis    Myocardial infarction ([place])    Neuropathy    resolved with back surgery; tingling and burning left leg to foot   Obesity    PONV (postoperative nausea and vomiting)    Psoriasis    Tubular adenoma of colon    colon path 01/09/2017    Past Surgical History:  Procedure Laterality Date   Carotid Doppler  10/2008   R & L ICAs 0-49% diameter reduction    COLONOSCOPY     CORONARY ANGIOPLASTY WITH STENT PLACEMENT  09/2008   2.25x70m Cypher DES to in-stent restenosis of prox LAD; Mini-Vision stent x2 2.0x166mto area distal of initial LAD stent; 3 2.5x1546maxus stents prox to Cypher stent in circumflex   CORONARY ANGIOPLASTY WITH STENT PLACEMENT  09/2005   Cypher DES 3.0x22m76m distal RCA; 2.25x12mm58mi0Vision stent to mid  LAD; 2.5x18mm 64mer stent to circumflex   CORONARY STENT INTERVENTION N/A 11/13/2018   Procedure: CORONARY STENT INTERVENTION;  Surgeon: VaranaJettie Booze [facility];  Service: Cardiovascular;  Laterality: N/A;   CORONARY STENT INTERVENTION N/A 12/02/2019   Procedure: CORONARY STENT INTERVENTION;  Surgeon: End, CNelva Bush [facility];  Service: Cardiovascular;  Laterality: N/A;   LEFT HEART CATH AND CORONARY ANGIOGRAPHY N/A 11/13/2018   Procedure: LEFT HEART CATH AND CORONARY ANGIOGRAPHY;  Surgeon: VaranaJettie Booze [facility];  Service: Cardiovascular;  Laterality: N/A;   LEFT HEART CATH AND CORONARY ANGIOGRAPHY N/A 12/02/2019   Procedure: LEFT HEART CATH AND CORONARY ANGIOGRAPHY;  Surgeon: End, CNelva Bush [facility];  Service: Cardiovascular;  Laterality: N/A;   LUMBAR LAMINECTOMY/DECOMPRESSION MICRODISCECTOMY Left 10/18/2016  Procedure: Left Lumbar Four-Five Microdiscectomy;  Surgeon: Erline Levine, MD;  Location: [facility];  Service: Neurosurgery;  Laterality: Left;  Left L4-5 Microdiscectomy   ROTATOR CUFF REPAIR Left    SHOULDER ARTHROSCOPY W/ ROTATOR CUFF REPAIR Right    TRANSTHORACIC ECHOCARDIOGRAM  04/2010   EF=>55%, borderline conc LVH; trace MR & TR; AV mildly sclerotic; aortic root sclerosis/calcif   TRIGGER FINGER RELEASE Left 02/19/2020   Procedure: RELEASE TRIGGER FINGER/A-1 PULLEY;  Surgeon: Earlie Server, MD;  Location: [facility];  Service: Orthopedics;  Laterality: Left;   ureteral stents      Outpatient Medications Prior to Visit  Medication Sig Dispense Refill   Alirocumab (PRALUENT) 150 MG/ML SOAJ Inject 150 mg into the skin every 14 (fourteen) days. 6 mL 3   aspirin EC 81 MG tablet Take 1 tablet (81 mg total) by mouth daily.     BD PEN NEEDLE NANO 2ND GEN 32G X 4 MM MISC USE AS DIRECTED FOR INSULIN ADMINISTRATION (Patient taking differently: 1 each by Other route as directed. For  insulin administration) 100 each 3   Blood Glucose Monitoring Suppl (ONETOUCH VERIO) w/Device KIT Use to check blood sugar 2 times a day (Patient taking differently: 1 each by Other route See admin instructions. Use to check blood sugar 2 times a day) 1 kit 0   BREO ELLIPTA 200-25 MCG/INH AEPB Inhale 1 puff into the lungs at bedtime.      clopidogrel (PLAVIX) 75 MG tablet TAKE 1 TABLET BY MOUTH DAILY WITH BREAKFAST. 90 tablet 2   Coenzyme Q10 300 MG CAPS Take 300 mg by mouth daily.     Cranberry 300 MG tablet Take 300 mg by mouth every evening.     fenofibrate (TRICOR) 145 MG tablet TAKE 1 TABLET (145 MG TOTAL) BY MOUTH EVERY EVENING. 90 tablet 1   glipiZIDE (GLUCOTROL) 5 MG tablet Take 0.5-1 tablets (2.5-5 mg total) by mouth daily before supper. (Patient taking differently: Take 2.5 mg by mouth as needed (for big meals).) 90 tablet 3   Glucosamine-Chondroit-Vit C-Mn (GLUCOSAMINE CHONDR 1500 COMPLX PO) Take 1 tablet by mouth daily.     glucose blood (ONETOUCH VERIO) test strip Use to check blood sugar 2 times a day (Patient taking differently: 1 each by Other route See admin instructions. Use to check blood sugar 2 times a day) 200 each 12   icosapent Ethyl (VASCEPA) 1 g capsule Take 2 capsules (2 g total) by mouth 2 (two) times daily. 360 capsule 3   insulin detemir (LEVEMIR FLEXTOUCH) 100 UNIT/ML FlexPen Inject 50 Units into the skin at bedtime. 45 mL 3   isosorbide mononitrate (IMDUR) 60 MG 24 hr tablet Take 1 tablet (60 mg total) by mouth daily. ** DO NOT CRUSH ** 90 tablet 3   meclizine (ANTIVERT) 25 MG tablet Take 1 tablet (25 mg total) by mouth 3 (three) times daily as needed for dizziness. 30 tablet 1   metFORMIN (GLUCOPHAGE) 1000 MG tablet Take 1 tablet (1,000 mg total) by mouth 2 (two) times daily with a meal. 180 tablet 3   Multiple Vitamins-Minerals (CENTRUM SILVER 50+WOMEN PO) Take 1 tablet by mouth daily.     nitrofurantoin, macrocrystal-monohydrate, (MACROBID) 100 MG capsule Take 1  capsule (100 mg total) by mouth 2 (two) times daily. 20 capsule 0   nitroGLYCERIN (NITROSTAT) 0.4 MG SL tablet PLACE 1 TABLET (0.4 MG TOTAL) UNDER THE TONGUE EVERY 5 (FIVE) MINUTES AS NEEDED FOR CHEST PAIN (UP TO 3 DOSES). 75 tablet 1  ReliOn Lancets Thin 26G MISC 1 Units by Does not apply route 2 (two) times daily. E 11.9 100 each 12   rosuvastatin (CRESTOR) 20 MG tablet TAKE 1 TABLET BY MOUTH EVERY DAY 90 tablet 3   Semaglutide, 1 MG/DOSE, (OZEMPIC, 1 MG/DOSE,) 4 MG/3ML SOPN Inject 1 mg into the skin once a week. (Patient taking differently: Inject 0.75 mg into the skin once a week.) 9 mL 3   sharps container For disposing of insulin pen needles 1 each 2   valsartan (DIOVAN) 160 MG tablet Take 1 tablet (160 mg total) by mouth daily. 90 tablet 3   carvedilol (COREG) 6.25 MG tablet Take 1 tablet (6.25 mg total) by mouth 2 (two) times daily. 180 tablet 1   ondansetron (ZOFRAN ODT) 4 MG disintegrating tablet Take 1 tablet (4 mg total) by mouth every 8 (eight) hours as needed for nausea or vomiting. 20 tablet 0   ranolazine (RANEXA) 1000 MG SR tablet Take 1 tablet (1,000 mg total) by mouth 2 (two) times daily. 180 tablet 3   fluconazole (DIFLUCAN) 150 MG tablet Take 1 tablet (150 mg total) by mouth daily. Take 1 tablet today. Take 1 tablet in 1 week. (Patient not taking: Reported on 01/08/2021) 2 tablet 0   No facility-administered medications prior to visit.     Allergies:   Penicillins, Coconut flavor, and Other   Social History   Socioeconomic History   Marital status: Married    Spouse name: Not on file   Number of children: 1   Years of education: Not on file   Highest education level: Not on file  Occupational History    Employer: [employer]  Tobacco Use   Smoking status: Never   Smokeless tobacco: Never  Vaping Use   Vaping Use: Never used  Substance and Sexual Activity   Alcohol use: No   Drug use: No   Sexual activity: Not Currently  Other Topics Concern    Not on file  Social History Narrative   Not on file   Social Determinants of Health   Financial Resource Strain: Not on file  Food Insecurity: Not on file  Transportation Needs: Not on file  Physical Activity: Not on file  Stress: Not on file  Social Connections: Not on file     ROS:   Please see the history of present illness.    ROS  All other systems are reviewed and are negative.   PHYSICAL EXAM:   VS:  BP (!) 162/100   Pulse 86   Ht 5' 4.25" (1.632 m)   Wt 181 lb 3.2 oz (82.2 kg)   SpO2 98%   BMI 30.86 kg/m      General: Alert, oriented x3, no distress, borderline obese Head: no evidence of trauma, PERRL, EOMI, no exophtalmos or lid lag, no myxedema, no xanthelasma; normal ears, nose and oropharynx Neck: normal jugular venous pulsations and no hepatojugular reflux; brisk carotid pulses without delay and no carotid bruits Chest: Bilateral moderate wheezing is heard, no signs of consolidation by percussion or palpation, normal fremitus, symmetrical and full respiratory excursions Cardiovascular: normal position and quality of the apical impulse, regular rhythm, normal first and second heart sounds, early peaking 2/6 aortic ejection murmur, no diastolic murmurs, rubs or gallops Abdomen: no tenderness or distention, no masses by palpation, no abnormal pulsatility or arterial bruits, normal bowel sounds, no hepatosplenomegaly Extremities: no clubbing, cyanosis or edema; 2+ radial, ulnar and brachial pulses bilaterally; 2+ right femoral, posterior tibial  and dorsalis pedis pulses; 2+ left femoral, posterior tibial and dorsalis pedis pulses; no subclavian or femoral bruits Neurological: grossly nonfocal Psych: Normal mood and affect, but becomes easily teary-eyed.    Wt Readings from Last 3 Encounters:  01/08/21 181 lb 3.2 oz (82.2 kg)  12/08/20 179 lb 3.2 oz (81.3 kg)  11/30/20 182 lb 3.2 oz (82.6 kg)    Studies/Labs Reviewed:   ECHO 10/30/2019:    1. Left ventricular  ejection fraction, by estimation, is 55 to 60%. The  left ventricle has normal function. The left ventricle has no regional  wall motion abnormalities. Left ventricular diastolic parameters are  indeterminate.   2. Right ventricular systolic function is normal. The right ventricular  size is normal. There is normal pulmonary artery systolic pressure.   3. The mitral valve is normal in structure. Trivial mitral valve  regurgitation. No evidence of mitral stenosis.   4. The aortic valve is tricuspid. Aortic valve regurgitation is not  visualized. Mild aortic valve stenosis.   5. The inferior vena cava is normal in size with greater than 50%  respiratory variability, suggesting right atrial pressure of 3 mmHg.     12/02/2019 ECHO  1. Left ventricular ejection fraction, by estimation, is 55 to 60%. The  left ventricle has normal function. The left ventricle has no regional  wall motion abnormalities.   2. Right ventricular systolic function is normal. The right ventricular  size is normal.   3. The mitral valve is grossly normal. Trivial mitral valve  regurgitation. No evidence of mitral stenosis.   4. The inferior vena cava is normal in size with greater than 50%  respiratory variability, suggesting right atrial pressure of 3 mmHg.   EKG: Not ordered today.  Her ECGs have never really been helpful when she has had acute coronary syndrome, they always look normal  Recent Labs: 08/20/2020: ALT 17 08/28/2020: BUN 26; Creatinine, Ser 1.67; Hemoglobin 10.1; Platelets 195; Potassium 5.0; Sodium 136   Lipid Panel    Component Value Date/Time   CHOL 83 (L) 08/20/2020 1024   TRIG 270 (H) 08/20/2020 1024   HDL 22 (L) 08/20/2020 1024   CHOLHDL 3.8 08/20/2020 1024   CHOLHDL 6.7 12/02/2019 0336   VLDL UNABLE TO CALCULATE IF TRIGLYCERIDE OVER 400 mg/dL 12/02/2019 0336   LDLCALC 21 08/20/2020 1024   LDLDIRECT 93 11/15/2012 0829    ASSESSMENT:    1. Coronary artery disease of native artery of  native heart with stable angina pectoris ([place])   2. Mild persistent asthma without complication   3. Mixed hyperlipidemia   4. Aortic valve stenosis, nonrheumatic   5. Essential hypertension   6. Insulin-requiring or dependent type II diabetes mellitus ([place])   7. Mild obesity   8. Stage 3b chronic kidney disease ([place])    PLAN:  In order of problems listed above:  CAD:   Occasional mild symptoms at rest, that have not required nitroglycerin.  There is always been suspicion for possible vasospastic angina.  Controlled on beta-blocker plus long-acting nitrates, we have not needed to add Ranexa.  Unfortunately, it sounds like the nonselective carvedilol may be worsening her reactive airway disease. Asthma: It seems that she was tolerating carvedilol fine, but now she has more wheezing.  We will switch from carvedilol to bisoprolol which is more beta-1 selective.  Another option would be to change nebivolol, if she develops worsening symptoms suggestive of vasospastic angina on the pure beta-1 blocker (for now she prefers to try the  less expensive option). HLP: Excellent LDL, but highly unfavorable HDL level.  She has multiple family members with early onset CAD including an uncle who passed away at age 47 and an uncle that required bypass surgery at 55. She is on a very aggressive lipid-lowering therapy with PCSK9 inhibitor, high-dose statin, Vascepa, fenofibrate.   AS: Mild by echo in July 2021.  Plan to repeat her echocardiogram in 2024 unless she develops symptoms of aortic stenosis. HTN: Usually well controlled, but her blood pressure was very high today.  She was however emotional.  Reassess after switching to bisoprolol. DM: Excellent control with hemoglobin A1c of 6.3%.  If possible, the goal will be to try to get her off insulin and insulin secretagogues.  Prefer GLP-1 agonist that she is taking and maybe SGLT2 inhibitors if she can tolerate those, since they improve cardiovascular  prognosis. CKD 3B: She has had a couple of events in the last 3 years where she had acute kidney injury with a creatinine that increased to 2.5-3.0 range, most recently due to her episode of vertigo a few months ago.  It appears that her new creatinine baseline is around 1.3-1.4, which would correspond to a GFR of around 45. Overweight/mildly obese: BMI 30.  Has gained some weight from her last appointment BPV: This was severe enough to cause hospitalization for a couple of days in early May, but has resolved.  She is aware of Epley maneuvers.  Usually improves promptly when she starts meclizine. Muscle cramps: Stay well-hydrated.  Try magnesium oxide 400 mg daily.  She should not take a potassium supplement as her potassium has a tendency to run in the high normal range (RTA type IV?) and she is on an ARB without taking diuretics.   Medication Adjustments/Labs and Tests Ordered: Current medicines are reviewed at length with the patient today.  Concerns regarding medicines are outlined above.  Medication changes, Labs and Tests ordered today are listed in the Patient Instructions below. Patient Instructions  Medication Instructions:  STOP the Carvedilol  START Bisoprolol 10 mg once daily TAKE: Magnesium Oxide 400 mg once daily as needed for muscle cramps  *If you need a refill on your cardiac medications before your next appointment, please call your pharmacy*   Lab Work: None ordered If you have labs (blood work) drawn today and your tests are completely normal, you will receive your results only by: [place] (if you have MyChart) OR A paper copy in the mail If you have any lab test that is abnormal or we need to change your treatment, we will call you to review the results.   Testing/Procedures: None ordered   Follow-Up: At [facility], you and your health needs are our priority.  As part of our continuing mission to provide you with exceptional heart care, we have created  designated Provider Care Teams.  These Care Teams include your primary Cardiologist (physician) and Advanced Practice Providers (APPs -  Physician Assistants and Nurse Practitioners) who all work together to provide you with the care you need, when you need it.  We recommend signing up for the patient portal called "MyChart".  Sign up information is provided on this After Visit Summary.  MyChart is used to connect with patients for Virtual Visits (Telemedicine).  Patients are able to view lab/test results, encounter notes, upcoming appointments, etc.  Non-urgent messages can be sent to your provider as well.   To learn more about what you can do with MyChart, go to [email or web address].  Your next appointment:   6 month(s)  The format for your next appointment:   In Person  Provider:   Sanda Klein, MD      Signed, Sanda Klein, MD  01/11/2021 8:43 PM    [facility] [address], [place], [place]  [address] Phone: [phone]; Fax: [phone]

## 2021-01-11 ENCOUNTER — Encounter: Payer: Self-pay | Admitting: Cardiovascular Disease

## 2021-01-28 ENCOUNTER — Encounter: Payer: Self-pay | Admitting: Family Medicine

## 2021-01-28 ENCOUNTER — Ambulatory Visit (INDEPENDENT_AMBULATORY_CARE_PROVIDER_SITE_OTHER): Payer: BC Managed Care – PPO | Admitting: Family Medicine

## 2021-01-28 VITALS — BP 128/80 | HR 81 | Temp 97.6°F | Ht 65.5 in | Wt 178.8 lb

## 2021-01-28 DIAGNOSIS — J4530 Mild persistent asthma, uncomplicated: Secondary | ICD-10-CM

## 2021-01-28 DIAGNOSIS — I10 Essential (primary) hypertension: Secondary | ICD-10-CM

## 2021-01-28 DIAGNOSIS — I2510 Atherosclerotic heart disease of native coronary artery without angina pectoris: Secondary | ICD-10-CM

## 2021-01-28 DIAGNOSIS — Z0000 Encounter for general adult medical examination without abnormal findings: Secondary | ICD-10-CM

## 2021-01-28 DIAGNOSIS — D649 Anemia, unspecified: Secondary | ICD-10-CM

## 2021-01-28 DIAGNOSIS — I350 Nonrheumatic aortic (valve) stenosis: Secondary | ICD-10-CM

## 2021-01-28 DIAGNOSIS — Z794 Long term (current) use of insulin: Secondary | ICD-10-CM

## 2021-01-28 DIAGNOSIS — E1169 Type 2 diabetes mellitus with other specified complication: Secondary | ICD-10-CM

## 2021-01-28 DIAGNOSIS — H9313 Tinnitus, bilateral: Secondary | ICD-10-CM | POA: Diagnosis not present

## 2021-01-28 DIAGNOSIS — I25118 Atherosclerotic heart disease of native coronary artery with other forms of angina pectoris: Secondary | ICD-10-CM

## 2021-01-28 DIAGNOSIS — R252 Cramp and spasm: Secondary | ICD-10-CM

## 2021-01-28 DIAGNOSIS — E669 Obesity, unspecified: Secondary | ICD-10-CM

## 2021-01-28 DIAGNOSIS — E785 Hyperlipidemia, unspecified: Secondary | ICD-10-CM

## 2021-01-28 DIAGNOSIS — M5126 Other intervertebral disc displacement, lumbar region: Secondary | ICD-10-CM

## 2021-01-28 DIAGNOSIS — E119 Type 2 diabetes mellitus without complications: Secondary | ICD-10-CM

## 2021-01-28 DIAGNOSIS — Z86010 Personal history of colonic polyps: Secondary | ICD-10-CM

## 2021-01-28 LAB — POCT GLYCOSYLATED HEMOGLOBIN (HGB A1C): Hemoglobin A1C: 7 % — AB (ref 4.0–5.6)

## 2021-01-28 MED ORDER — BREO ELLIPTA 200-25 MCG/INH IN AEPB: 1.0000 | INHALATION_SPRAY | Freq: Every evening | RESPIRATORY_TRACT | 3 refills | Status: DC

## 2021-01-28 NOTE — Addendum Note (Signed)
Addended by: Elyse Jarvis on: 01/28/2021 10:38 AM   Modules accepted: Orders

## 2021-01-28 NOTE — Progress Notes (Signed)
   Subjective:    Patient ID: Haley George, female    DOB: July 19, 1955, 65 y.o.   MRN: [id number]  HPI She is here for complete exam.  Her main complaint today is leg cramps.  They initially started when she was doing a lot of standing but now she notes a cramps at any time.  She continues to be followed by cardiology for her underlying heart disease.  She has had stents and is also being followed for her aortic stenosis.  She is also followed by endocrinology for her underlying diabetes.  Her A1c on August 16 was 6.3.  She is happy with that.  She has had some back pain and did have an injection which apparently has helped a lot with her back pain.  She has asthma and admits to not taking her Breo on a regular basis.  She rarely uses the albuterol inhaler.  She is scheduled for follow-up colonoscopy in 2023 for colonic polyp.  She continues have some minimal difficulty with ringing in her ears.   Review of Systems  All other systems reviewed and are negative.     Objective:   Physical Exam Alert and in no distress. Tympanic membranes and canals are normal. Pharyngeal area is normal. Neck is supple without adenopathy or thyromegaly. Cardiac exam shows a regular sinus rhythm with a systolic murmur radiating to the right neck.. Lungs are clear to auscultation.  Lower extremity exam shows normal pulses sensation and reflexes.  Calves are nontender.        Assessment & Plan:  Routine general medical examination at a health care facility  Essential hypertension  Insulin-requiring or dependent type II diabetes mellitus ([place])  ASHD (arteriosclerotic heart disease)  Tinnitus of both ears  Herniated intervertebral disc of lumbar spine  Mild persistent asthma without complication - Plan: BREO ELLIPTA 200-25 MCG/INH AEPB  Coronary artery disease of native artery of native heart with stable angina pectoris (HCC)  History of colonic polyps  Leg cramps - Plan: Comprehensive metabolic panel,  Magnesium  Aortic valve stenosis, etiology of cardiac valve disease unspecified  Obesity (BMI 30-39.9)  Hyperlipidemia associated with type 2 diabetes mellitus ([place])  Normocytic anemia We will do some blood work to evaluate the cramping but mentioned that we really do a very poor job of this and recommend stretching as well as Tylenol.  She will continue on her present medication and follow-up with cardiology as well as endocrinology.  Encouraged her to become more physically active.  She will keep her scheduled appointment for colonoscopy.  Her Memory Dance was renewed and I encouraged her to use the medication regularly.  She plans to change when she uses the inhaler as she sometimes gets bogged down with her medication regimen.

## 2021-01-29 LAB — COMPREHENSIVE METABOLIC PANEL
ALT: 27 IU/L (ref 0–32)
AST: 41 IU/L — ABNORMAL HIGH (ref 0–40)
Albumin/Globulin Ratio: 2 (ref 1.2–2.2)
Albumin: 4.5 g/dL (ref 3.8–4.8)
Alkaline Phosphatase: 36 IU/L — ABNORMAL LOW (ref 44–121)
BUN/Creatinine Ratio: 19 (ref 12–28)
BUN: 36 mg/dL — ABNORMAL HIGH (ref 8–27)
Bilirubin Total: 0.3 mg/dL (ref 0.0–1.2)
CO2: 19 mmol/L — ABNORMAL LOW (ref 20–29)
Calcium: 10 mg/dL (ref 8.7–10.3)
Chloride: 104 mmol/L (ref 96–106)
Creatinine, Ser: 1.92 mg/dL — ABNORMAL HIGH (ref 0.57–1.00)
Globulin, Total: 2.3 g/dL (ref 1.5–4.5)
Glucose: 108 mg/dL — ABNORMAL HIGH (ref 70–99)
Potassium: 5.6 mmol/L — ABNORMAL HIGH (ref 3.5–5.2)
Sodium: 138 mmol/L (ref 134–144)
Total Protein: 6.8 g/dL (ref 6.0–8.5)
eGFR: 29 mL/min/{1.73_m2} — ABNORMAL LOW (ref >59)

## 2021-01-29 LAB — MAGNESIUM: Magnesium: 1.9 mg/dL (ref 1.6–2.3)

## 2021-02-17 ENCOUNTER — Other Ambulatory Visit: Payer: Self-pay

## 2021-02-17 ENCOUNTER — Ambulatory Visit (INDEPENDENT_AMBULATORY_CARE_PROVIDER_SITE_OTHER): Payer: BC Managed Care – PPO | Admitting: Medical

## 2021-02-17 VITALS — BP 130/82 | HR 91 | Temp 97.3°F | Wt 182.2 lb

## 2021-02-17 DIAGNOSIS — N3000 Acute cystitis without hematuria: Secondary | ICD-10-CM

## 2021-02-17 DIAGNOSIS — K6289 Other specified diseases of anus and rectum: Secondary | ICD-10-CM | POA: Diagnosis not present

## 2021-02-17 DIAGNOSIS — R300 Dysuria: Secondary | ICD-10-CM | POA: Diagnosis not present

## 2021-02-17 LAB — POCT URINALYSIS DIP (PROADVANTAGE DEVICE)
Bilirubin, UA: NEGATIVE
Blood, UA: NEGATIVE
Glucose, UA: NEGATIVE mg/dL
Ketones, POC UA: NEGATIVE mg/dL
Leukocytes, UA: NEGATIVE
Nitrite, UA: NEGATIVE
Protein Ur, POC: NEGATIVE mg/dL
Specific Gravity, Urine: 1.02
Urobilinogen, Ur: NEGATIVE
pH, UA: 6 (ref 5.0–8.0)

## 2021-02-17 MED ORDER — NITROFURANTOIN MONOHYD MACRO 100 MG PO CAPS: 100.0000 mg | ORAL_CAPSULE | Freq: Two times a day (BID) | ORAL | 0 refills | Status: DC

## 2021-02-17 NOTE — Progress Notes (Signed)
Subjective: Chief Complaint  Patient presents with   possible UTI    Possible UTI- started last Wednesday. Frequency, cloudy urine, burning with urination.    She is her for possible UTI.   She notes urine frequency ,recent cloudy urine ,has had some discomfort with urination.  Has been working to hydrate well and flush the infection out.   Feels some better today. Has had urine odor.  No blood in urine.   No fever, no back pain, no lower abdominal pain.  No chills or body aches.  No vaginal discharge.  No pelvic pain.   No other aggravating or relieving factors.  She notes anal leakage periodically, has leakages sometimes without knowing.  She thinks this has contributed to UTIs.   She has hx/o UTI but no recent pain or blood in urine like she has had with past stones.  No other complaint.  Past Medical History:  Diagnosis Date   Asthma    Cancer ([place])    basal cell chest   Complication of anesthesia    Coronary artery disease    Diabetes mellitus without complication ([place])    type 2   Heart murmur    Hepatitis    auto- immune   History of kidney stones    History of nuclear stress test 05/21/2010   dipyridamole; normal perfusion, preserved LV systolic EF of 23%   Hyperlipidemia    Hypertension    Liver failure ([place])    h/o autoimmune hepatitis    Myocardial infarction ([place])    Neuropathy    resolved with back surgery; tingling and burning left leg to foot   Obesity    PONV (postoperative nausea and vomiting)    Psoriasis    Tubular adenoma of colon    colon path 01/09/2017   Current Outpatient Medications on File Prior to Visit  Medication Sig Dispense Refill   Alirocumab (PRALUENT) 150 MG/ML SOAJ Inject 150 mg into the skin every 14 (fourteen) days. 6 mL 3   aspirin EC 81 MG tablet Take 1 tablet (81 mg total) by mouth daily.     BD PEN NEEDLE NANO 2ND GEN 32G X 4 MM MISC USE AS DIRECTED FOR INSULIN ADMINISTRATION (Patient taking differently: 1 each by Other route as  directed. For insulin administration) 100 each 3   bisoprolol (ZEBETA) 10 MG tablet Take 1 tablet (10 mg total) by mouth daily. 90 tablet 1   Blood Glucose Monitoring Suppl (ONETOUCH VERIO) w/Device KIT Use to check blood sugar 2 times a day (Patient taking differently: 1 each by Other route See admin instructions. Use to check blood sugar 2 times a day) 1 kit 0   BREO ELLIPTA 200-25 MCG/INH AEPB Inhale 1 puff into the lungs at bedtime. 3 each 3   clopidogrel (PLAVIX) 75 MG tablet TAKE 1 TABLET BY MOUTH DAILY WITH BREAKFAST. 90 tablet 2   Coenzyme Q10 300 MG CAPS Take 300 mg by mouth daily.     Cranberry 300 MG tablet Take 300 mg by mouth every evening.     fenofibrate (TRICOR) 145 MG tablet TAKE 1 TABLET (145 MG TOTAL) BY MOUTH EVERY EVENING. 90 tablet 1   glipiZIDE (GLUCOTROL) 5 MG tablet Take 0.5-1 tablets (2.5-5 mg total) by mouth daily before supper. (Patient taking differently: Take 2.5 mg by mouth as needed (for big meals).) 90 tablet 3   Glucosamine-Chondroit-Vit C-Mn (GLUCOSAMINE CHONDR 1500 COMPLX PO) Take 1 tablet by mouth daily.     glucose blood (ONETOUCH  VERIO) test strip Use to check blood sugar 2 times a day 200 each 12   icosapent Ethyl (VASCEPA) 1 g capsule Take 2 capsules (2 g total) by mouth 2 (two) times daily. 360 capsule 3   insulin detemir (LEVEMIR FLEXTOUCH) 100 UNIT/ML FlexPen Inject 50 Units into the skin at bedtime. 45 mL 3   isosorbide mononitrate (IMDUR) 60 MG 24 hr tablet Take 1 tablet (60 mg total) by mouth daily. ** DO NOT CRUSH ** 90 tablet 3   meclizine (ANTIVERT) 25 MG tablet Take 1 tablet (25 mg total) by mouth 3 (three) times daily as needed for dizziness. 30 tablet 1   metFORMIN (GLUCOPHAGE) 1000 MG tablet Take 1 tablet (1,000 mg total) by mouth 2 (two) times daily with a meal. 180 tablet 3   Multiple Vitamins-Minerals (CENTRUM SILVER 50+WOMEN PO) Take 1 tablet by mouth daily.     nitroGLYCERIN (NITROSTAT) 0.4 MG SL tablet PLACE 1 TABLET (0.4 MG TOTAL) UNDER  THE TONGUE EVERY 5 (FIVE) MINUTES AS NEEDED FOR CHEST PAIN (UP TO 3 DOSES). 75 tablet 1   ReliOn Lancets Thin 26G MISC 1 Units by Does not apply route 2 (two) times daily. E 11.9 100 each 12   rosuvastatin (CRESTOR) 20 MG tablet TAKE 1 TABLET BY MOUTH EVERY DAY 90 tablet 3   Semaglutide, 1 MG/DOSE, (OZEMPIC, 1 MG/DOSE,) 4 MG/3ML SOPN Inject 1 mg into the skin once a week. (Patient taking differently: Inject 0.75 mg into the skin once a week.) 9 mL 3   sharps container For disposing of insulin pen needles 1 each 2   valsartan (DIOVAN) 160 MG tablet Take 1 tablet (160 mg total) by mouth daily. 90 tablet 3   No current facility-administered medications on file prior to visit.   ROS as in subjective   Objective: BP 130/82   Pulse 91   Temp (!) 97.3 F (36.3 C)   Wt 182 lb 3.2 oz (82.6 kg)   BMI 29.86 kg/m  Gen: wdwn, nad Abdomen: +bs, soft, nontender, no obvious mass or organomegaly Back nontender GU/rectal - declined    Assessment: Encounter Diagnoses  Name Primary?   Burning with urination Yes   Acute cystitis without hematuria    Decreased anal sphincter tone      Plan We will use a round of Macrobid, urine culture sent.   I sent email and asked her to follow up with Dr. Watt Climes Fabienne Bruns about anal leakage, evaluation and treatment.   This may be putting her at risk for UTIs.   I reviewed urine cultures from 11/2020 and 02/2020 both + for urinary infection with antibiotic resistance.     Continue good hydration, begin Macrobid, follow up if not much improved within the next 7 days.  F/u with gastroenterology

## 2021-02-19 ENCOUNTER — Other Ambulatory Visit: Payer: Self-pay

## 2021-02-19 ENCOUNTER — Encounter: Payer: Self-pay | Admitting: Family Medicine

## 2021-02-19 ENCOUNTER — Telehealth: Payer: BC Managed Care – PPO | Admitting: Family Medicine

## 2021-02-19 VITALS — BP 130/80 | HR 96 | Temp 97.8°F | Ht 65.0 in | Wt 179.0 lb

## 2021-02-19 DIAGNOSIS — J029 Acute pharyngitis, unspecified: Secondary | ICD-10-CM

## 2021-02-19 DIAGNOSIS — N6320 Unspecified lump in the left breast, unspecified quadrant: Secondary | ICD-10-CM

## 2021-02-19 LAB — POCT RAPID STREP A (OFFICE): Rapid Strep A Screen: NEGATIVE

## 2021-02-19 NOTE — Progress Notes (Signed)
   Subjective:    Patient ID: Haley George, female    DOB: 12-Sep-1955, 65 y.o.   MRN: [id number]  HPI She is here for evaluation of a sore throat that she has had for the last several several days.  No fever, chills.  She also has noted a lesion in her breast that she would like further evaluated.   Review of Systems     Objective:   Physical Exam Alert and in no distress. Tympanic membranes and canals are normal. Pharyngeal area is normal. Neck is supple without adenopathy or thyromegaly. Cardiac exam shows a regular sinus rhythm without murmurs or gallops. Lungs are clear to auscultation. Exam of the left breast does show a lesion at the 4 o'clock position. Strep screen is negative      Assessment & Plan:  Sore throat - Plan: Rapid Strep A  Mass of left breast, unspecified quadrant Appropriate paperwork was filled out to get the lesion evaluated at [facility].

## 2021-02-24 LAB — URINE CULTURE

## 2021-03-08 LAB — HM MAMMOGRAPHY

## 2021-03-10 ENCOUNTER — Telehealth: Payer: Self-pay

## 2021-03-10 ENCOUNTER — Other Ambulatory Visit: Payer: Self-pay

## 2021-03-10 ENCOUNTER — Other Ambulatory Visit (INDEPENDENT_AMBULATORY_CARE_PROVIDER_SITE_OTHER): Payer: BC Managed Care – PPO

## 2021-03-10 DIAGNOSIS — R300 Dysuria: Secondary | ICD-10-CM

## 2021-03-10 DIAGNOSIS — Z23 Encounter for immunization: Secondary | ICD-10-CM

## 2021-03-10 LAB — POCT URINALYSIS DIP (PROADVANTAGE DEVICE)
Bilirubin, UA: NEGATIVE
Blood, UA: NEGATIVE
Glucose, UA: NEGATIVE mg/dL
Ketones, POC UA: NEGATIVE mg/dL
Leukocytes, UA: NEGATIVE
Nitrite, UA: NEGATIVE
Specific Gravity, Urine: 1.02
Urobilinogen, Ur: 0.2
pH, UA: 6 (ref 5.0–8.0)

## 2021-03-10 NOTE — Telephone Encounter (Signed)
Pt came by and wanted to leave a U/A for follow up from 02/17/21 with [facility]. Had pt leave a sample just in case you advised that it was ok. Please advise if you are will ing to check or should pt schedule an appointment. Melvina

## 2021-03-10 NOTE — Progress Notes (Unsigned)
Follow up on U/a  done .

## 2021-03-10 NOTE — Telephone Encounter (Signed)
Done KH 

## 2021-03-10 NOTE — Telephone Encounter (Signed)
Called pt to advise of U/ A no answer . Lvm [place]

## 2021-03-10 NOTE — Telephone Encounter (Signed)
Pt U/A was resulted Please advise [facility]

## 2021-03-11 NOTE — Telephone Encounter (Signed)
Lvm for pt to call back. KH 

## 2021-03-15 ENCOUNTER — Other Ambulatory Visit (INDEPENDENT_AMBULATORY_CARE_PROVIDER_SITE_OTHER): Payer: BC Managed Care – PPO

## 2021-03-15 ENCOUNTER — Encounter: Payer: Self-pay | Admitting: Family Medicine

## 2021-03-15 ENCOUNTER — Telehealth: Payer: BC Managed Care – PPO | Admitting: Family Medicine

## 2021-03-15 VITALS — BP 130/82 | Wt 179.0 lb

## 2021-03-15 DIAGNOSIS — R059 Cough, unspecified: Secondary | ICD-10-CM | POA: Diagnosis not present

## 2021-03-15 DIAGNOSIS — U071 COVID-19: Secondary | ICD-10-CM

## 2021-03-15 DIAGNOSIS — R509 Fever, unspecified: Secondary | ICD-10-CM

## 2021-03-15 LAB — POCT INFLUENZA A/B
Influenza A, POC: NEGATIVE
Influenza B, POC: NEGATIVE

## 2021-03-15 LAB — POC COVID19 BINAXNOW: SARS Coronavirus 2 Ag: POSITIVE — AB

## 2021-03-15 MED ORDER — MOLNUPIRAVIR EUA 200MG CAPSULE: 4.0000 | ORAL_CAPSULE | Freq: Two times a day (BID) | ORAL | 0 refills | Status: AC

## 2021-03-15 NOTE — Progress Notes (Signed)
   Subjective:    Patient ID: Haley George, female    DOB: January 22, 1956, 65 y.o.   MRN: [id number]  HPI Documentation for virtual audio and video telecommunications through [employer] encounter: The patient was located at home. 2 patient identifiers used.  The provider was located in the office. The patient did consent to this visit and is aware of possible charges through their insurance for this visit. The other persons participating in this telemedicine service were none. Time spent on call was 5 minutes and in review of previous records >15 minutes total for counseling and coordination of care. This virtual service is not related to other E/M service within previous 7 days.  She states that she got the flu/COVID shot on Wednesday and on Saturday she developed fever, chills, headache, nasal congestion followed the next day by diarrhea and 1 episode of vomiting.  She mentions decreased taste but not smell  Review of Systems     Objective:   Physical Exam Alert and in no distress otherwise not examined.  She does not appear toxic       Assessment & Plan:  Fever and chills She will be set up to do COVID as well as flu.  Recommend Tylenol, Imodium and Claritin.  If the COVID test is positive, I will place her on Lagevrio due to her kidney function. COVID test did come back positive.  I will place her on the above medication.  Discussed 5 days of quarantine and at the end of that as long she is feeling better she can then be out in public with a mask.  She expressed understanding of this.

## 2021-03-17 ENCOUNTER — Telehealth: Payer: Self-pay

## 2021-03-17 NOTE — Telephone Encounter (Signed)
Pt was called back Haley George 

## 2021-03-17 NOTE — Telephone Encounter (Signed)
S/S started 03/13/21,   Patient wants to ask Maudie Mercury questions about sanitizing her house so her daughter can visit her house on Friday   [phone]

## 2021-03-24 ENCOUNTER — Encounter: Payer: Self-pay | Admitting: Family Medicine

## 2021-04-01 ENCOUNTER — Telehealth: Payer: Self-pay | Admitting: Cardiovascular Disease

## 2021-04-01 NOTE — Telephone Encounter (Signed)
Spoke with the patient. She stated that she has been having increased palpitations since having Covid over Thanksgiving. She denies chest pain or shortness of breath.  Appointment made for 12/12 with Dr. Sallyanne Kuster

## 2021-04-01 NOTE — Telephone Encounter (Signed)
Left a message for the patient to call back.  

## 2021-04-01 NOTE — Telephone Encounter (Signed)
Spoke with pt. She states she has been having a irregular heart rate since having Covid. Pt was to be seen in 6 months but she wants to be seen sooner. Pt added to schedule. She was advised to take her blood pressure daily with heart rate. She states she is not able to do that "I do not have the means" Pt want to know if we can help her with a blood pressure cuff. Will forward to Dr. [name] nurse for assistance.

## 2021-04-01 NOTE — Telephone Encounter (Signed)
Patient c/o Palpitations:  High priority if patient c/o lightheadedness, shortness of breath, or chest pain  How long have you had palpitations/irregular HR/ Afib? Are you having the symptoms now?  Patient has been experiencing an irregular HR a few times every hour (worse in evenings) for the past month. No symptoms currently.  Are you currently experiencing lightheadedness, SOB or CP?  No   Do you have a history of afib (atrial fibrillation) or irregular heart rhythm?  No   Have you checked your BP or HR? (document readings if available):  No   Are you experiencing any other symptoms?  No

## 2021-04-03 ENCOUNTER — Other Ambulatory Visit: Payer: Self-pay | Admitting: Family Medicine

## 2021-04-03 DIAGNOSIS — E1169 Type 2 diabetes mellitus with other specified complication: Secondary | ICD-10-CM

## 2021-04-05 ENCOUNTER — Ambulatory Visit: Payer: BC Managed Care – PPO | Admitting: Cardiovascular Disease

## 2021-04-05 ENCOUNTER — Ambulatory Visit (INDEPENDENT_AMBULATORY_CARE_PROVIDER_SITE_OTHER): Payer: BC Managed Care – PPO

## 2021-04-05 ENCOUNTER — Encounter: Payer: Self-pay | Admitting: Cardiovascular Disease

## 2021-04-05 ENCOUNTER — Other Ambulatory Visit: Payer: Self-pay

## 2021-04-05 VITALS — BP 179/85 | HR 84 | Ht 64.0 in | Wt 178.8 lb

## 2021-04-05 DIAGNOSIS — R002 Palpitations: Secondary | ICD-10-CM

## 2021-04-05 DIAGNOSIS — I350 Nonrheumatic aortic (valve) stenosis: Secondary | ICD-10-CM

## 2021-04-05 DIAGNOSIS — I25118 Atherosclerotic heart disease of native coronary artery with other forms of angina pectoris: Secondary | ICD-10-CM

## 2021-04-05 DIAGNOSIS — E782 Mixed hyperlipidemia: Secondary | ICD-10-CM | POA: Diagnosis not present

## 2021-04-05 DIAGNOSIS — E1169 Type 2 diabetes mellitus with other specified complication: Secondary | ICD-10-CM

## 2021-04-05 DIAGNOSIS — N1832 Chronic kidney disease, stage 3b: Secondary | ICD-10-CM

## 2021-04-05 DIAGNOSIS — E875 Hyperkalemia: Secondary | ICD-10-CM

## 2021-04-05 DIAGNOSIS — E663 Overweight: Secondary | ICD-10-CM

## 2021-04-05 DIAGNOSIS — E669 Obesity, unspecified: Secondary | ICD-10-CM

## 2021-04-05 DIAGNOSIS — J4530 Mild persistent asthma, uncomplicated: Secondary | ICD-10-CM | POA: Diagnosis not present

## 2021-04-05 DIAGNOSIS — I10 Essential (primary) hypertension: Secondary | ICD-10-CM

## 2021-04-05 DIAGNOSIS — Z87898 Personal history of other specified conditions: Secondary | ICD-10-CM

## 2021-04-05 NOTE — Patient Instructions (Signed)
Medication Instructions:  No changes *If you need a refill on your cardiac medications before your next appointment, please call your pharmacy*   Lab Work: None ordered If you have labs (blood work) drawn today and your tests are completely normal, you will receive your results only by: [place] (if you have MyChart) OR A paper copy in the mail If you have any lab test that is abnormal or we need to change your treatment, we will call you to review the results.   Testing/Procedures: [place]- Long Term Monitor Instructions  Your physician has requested you wear a ZIO patch monitor for 3 days.  This is a single patch monitor. [employer] supplies one patch monitor per enrollment. Additional stickers are not available. Please do not apply patch if you will be having a Nuclear Stress Test,  Echocardiogram, Cardiac CT, MRI, or Chest Xray during the period you would be wearing the  monitor. The patch cannot be worn during these tests. You cannot remove and re-apply the  ZIO XT patch monitor.  Your ZIO patch monitor will be mailed 3 day USPS to your address on file. It may take 3-5 days  to receive your monitor after you have been enrolled.  Once you have received your monitor, please review the enclosed instructions. Your monitor  has already been registered assigning a specific monitor serial # to you.  Billing and Patient Assistance Program Information  We have supplied [employer] with any of your insurance information on file for billing purposes. [employer] offers a sliding scale Patient Assistance Program for patients that do not have  insurance, or whose insurance does not completely cover the cost of the ZIO monitor.  You must apply for the Patient Assistance Program to qualify for this discounted rate.  To apply, please call [employer] at [phone], select option 4, select option 2, ask to apply for  Patient Assistance Program. Theodore Demark will ask your household income, and how many  people  are in your household. They will quote your out-of-pocket cost based on that information.  [employer] will also be able to set up a 4-month, interest-free payment plan if needed.  Applying the monitor   Shave hair from upper left chest.  Hold abrader disc by orange tab. Rub abrader in 40 strokes over the upper left chest as  indicated in your monitor instructions.  Clean area with 4 enclosed alcohol pads. Let dry.  Apply patch as indicated in monitor instructions. Patch will be placed under collarbone on left  side of chest with arrow pointing upward.  Rub patch adhesive wings for 2 minutes. Remove white label marked "1". Remove the white  label marked "2". Rub patch adhesive wings for 2 additional minutes.  While looking in a mirror, press and release button in center of patch. A small green light will  flash 3-4 times. This will be your only indicator that the monitor has been turned on.  Do not shower for the first 24 hours. You may shower after the first 24 hours.  Press the button if you feel a symptom. You will hear a small click. Record Date, Time and  Symptom in the Patient Logbook.  When you are ready to remove the patch, follow instructions on the last 2 pages of Patient  Logbook. Stick patch monitor onto the last page of Patient Logbook.  Place Patient Logbook in the blue and white box. Use locking tab on box and tape box closed  securely. The blue and white box  has prepaid postage on it. Please place it in the mailbox as  soon as possible. Your physician should have your test results approximately 7 days after the  monitor has been mailed back to [facility].  Call [place] at [phone] if you have questions regarding  your ZIO XT patch monitor. Call them immediately if you see an orange light blinking on your  monitor.  If your monitor falls off in less than 4 days, contact our Monitor department at [phone].  If your monitor becomes  loose or falls off after 4 days call [employer] at [phone] for  suggestions on securing your monitor    Follow-Up: At [facility], you and your health needs are our priority.  As part of our continuing mission to provide you with exceptional heart care, we have created designated Provider Care Teams.  These Care Teams include your primary Cardiologist (physician) and Advanced Practice Providers (APPs -  Physician Assistants and Nurse Practitioners) who all work together to provide you with the care you need, when you need it.  We recommend signing up for the patient portal called "MyChart".  Sign up information is provided on this After Visit Summary.  MyChart is used to connect with patients for Virtual Visits (Telemedicine).  Patients are able to view lab/test results, encounter notes, upcoming appointments, etc.  Non-urgent messages can be sent to your provider as well.   To learn more about what you can do with MyChart, go to [email or web address].    Your next appointment:   3 month(s)  The format for your next appointment:   In Person  Provider:   Sanda Klein, MD

## 2021-04-05 NOTE — Progress Notes (Signed)
Patient ID: Haley George, female   DOB: 11-23-1955, 65 y.o.   MRN: [id number]    Cardiology Office Note    Date:  04/05/2021   ID:  RAEANNE DESCHLER, DOB 1956/03/31, MRN [id number]  PCP:  Denita Lung, MD  Cardiologist:   Sanda Klein, MD   Chief Complaint  Patient presents with   Palpitations  CAD  History of Present Illness:  MALENI SEYER is a 65 y.o. female with diabetes mellitus type II, severe mixed hyperlipidemia, previous percutaneous revascularization procedures performed in all 3 major coronary arteries and restenosis requiring repeat PCI, mild calcific aortic stenosis.  Most recent intervention was placement of 2 drug-eluting stents in the mid-distal left circumflex coronary artery covering a de novo mid vessel lesion and a distal in-stent restenosis lesion in the setting of small NSTEMI (peak troponin 1818) on 12/02/2019.  She is done better since the last appointment, but is now troubled by intermittent palpitations.  These began to be much more frequent following COVID-19 infection around Thanksgiving.  They have persisted even though all the other symptoms have resolved.  She describes these as a "hesitation" in her heartbeat followed by a release which she describes with the interjection "poof".  It does not appear that she is describing sustained arrhythmia such as atrial fibrillation.  She has not yet made the transition from carvedilol to bisoprolol which we had planned due to worsening wheezing that occurred both at rest or with activity.  She is use less albuterol.  She wanted to finish her reserve of carvedilol and plans to start the bisoprolol tomorrow.  Her blood pressure was markedly elevated today, but had an office visit a couple of weeks ago was only 130/82.  She has not been checking her blood pressure at home and needs a new cuff.  She denies angina pectoris since her last appointment and has not required nitroglycerin..  Emotionally, she is having a much harder  time.  Its been over a year year since [name], her husband, passed away.  She continues to have financial concerns and is sometimes overwhelmed by all the household tasks that he used to perform.  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  [name], their daughter, is also seeking counseling for her issues with PTSD, after doing CPR on her dad and witnessing his demise.  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  She has a history of vertigo, none since last appointment.  It's been about 16 months since the placement of her last pair of drug-eluting stents.  She does have a history of in-stent restenosis.  Has not had edema of the lower extremities, orthopnea, PND, focal neurological deficits.  Metabolic control continues to be pretty good.  Her hemoglobin A1c went up a bit but is still acceptable at 7.0% (A1c was 6.3% on 12/08/2020).  On metformin, Ozempic, but also Levemir.  Her LDL is very low at 21, but she also has a very low HDL of 22.  She is taking both rosuvastatin and Praluent as well as fenofibrate and Vascepa.  Triglycerides also still a little high at 270.   She developed unstable angina and she underwent cardiac catheterization on December 02, 2019.  The culprit lesion was found to be a 95% in-stent restenosis in the mid left circumflex coronary artery, as well as a de novo 70% mid left circumflex stenosis at the OM2 bifurcation.  This was treated by placement  of overlapping 2.5x12 and 2.25x20 drug-eluting Synergy stents (Dr. Saunders Revel).  Also noted was a mild-moderate mid to distal LAD in-stent restenosis and an unchanged 80% RPDA lesion.  She had complete relief of her angina following this intervention.      Previous coronary interventions 2007  - mid LAD 2.2512 mini vision - left circumflex 2.518 Cypher  - RCA 3.023 Cypher  2010  - mid LAD 2.2512 Taxus overlapping 2.012 mini vision, overlapping another 2.012 mini  vision -  left circumflex 2.516 Taxus upstream of previous stent.  July 2020  - mid LAD Synergy 2.5 x12 , overlapping the proximal area of restenosis in the mid LAD (short area of 3 stent layers).  August 2021 - mid LCX overlapping 2.5x12 and 2.25x20 drug-eluting Synergy (the distal stents overlaps the previously placed Taxus stent from 2010)  She had a normal nuclear stress test in November 2016. EF was 56%. She had a "false positive" ECG response.  In July 2020 she had a moderate sized area of ischemia in the anteroseptal wall, EF 52%, Prior to placement of the mid LAD stent.  Echo in August 2021 showed normal left ventricular regional motion and EF 55-60%.  Past Medical History:  Diagnosis Date   Asthma    Cancer ([place])    basal cell chest   Complication of anesthesia    Coronary artery disease    Diabetes mellitus without complication ([place])    type 2   Heart murmur    Hepatitis    auto- immune   History of kidney stones    History of nuclear stress test 05/21/2010   dipyridamole; normal perfusion, preserved LV systolic EF of 44%   Hyperlipidemia    Hypertension    Liver failure ([place])    h/o autoimmune hepatitis    Myocardial infarction ([place])    Neuropathy    resolved with back surgery; tingling and burning left leg to foot   Obesity    PONV (postoperative nausea and vomiting)    Psoriasis    Tubular adenoma of colon    colon path 01/09/2017    Past Surgical History:  Procedure Laterality Date   Carotid Doppler  10/2008   R & L ICAs 0-49% diameter reduction    COLONOSCOPY     CORONARY ANGIOPLASTY WITH STENT PLACEMENT  09/2008   2.25x89m Cypher DES to in-stent restenosis of prox LAD; Mini-Vision stent x2 2.0x157mto area distal of initial LAD stent; 3 2.5x1537maxus stents prox to Cypher stent in circumflex   CORONARY ANGIOPLASTY WITH STENT PLACEMENT  09/2005   Cypher DES 3.0x22m63m distal RCA; 2.25x12mm72mi0Vision stent to mid LAD; 2.5x18mm 26mer stent to circumflex    CORONARY STENT INTERVENTION N/A 11/13/2018   Procedure: CORONARY STENT INTERVENTION;  Surgeon: VaranaJettie Booze [facility];  Service: Cardiovascular;  Laterality: N/A;   CORONARY STENT INTERVENTION N/A 12/02/2019   Procedure: CORONARY STENT INTERVENTION;  Surgeon: End, CNelva Bush [facility];  Service: Cardiovascular;  Laterality: N/A;   LEFT HEART CATH AND CORONARY ANGIOGRAPHY N/A 11/13/2018   Procedure: LEFT HEART CATH AND CORONARY ANGIOGRAPHY;  Surgeon: VaranaJettie Booze [facility];  Service: Cardiovascular;  Laterality: N/A;   LEFT HEART CATH AND CORONARY ANGIOGRAPHY N/A 12/02/2019   Procedure: LEFT HEART CATH AND CORONARY ANGIOGRAPHY;  Surgeon: End, CNelva Bush [facility];  Service: Cardiovascular;  Laterality: N/A;   LUMBAR LAMINECTOMY/DECOMPRESSION MICRODISCECTOMY Left  10/18/2016   Procedure: Left Lumbar Four-Five Microdiscectomy;  Surgeon: Erline Levine, MD;  Location: [facility];  Service: Neurosurgery;  Laterality: Left;  Left L4-5 Microdiscectomy   ROTATOR CUFF REPAIR Left    SHOULDER ARTHROSCOPY W/ ROTATOR CUFF REPAIR Right    TRANSTHORACIC ECHOCARDIOGRAM  04/2010   EF=>55%, borderline conc LVH; trace MR & TR; AV mildly sclerotic; aortic root sclerosis/calcif   TRIGGER FINGER RELEASE Left 02/19/2020   Procedure: RELEASE TRIGGER FINGER/A-1 PULLEY;  Surgeon: Earlie Server, MD;  Location: [facility];  Service: Orthopedics;  Laterality: Left;   ureteral stents      Outpatient Medications Prior to Visit  Medication Sig Dispense Refill   Alirocumab (PRALUENT) 150 MG/ML SOAJ Inject 150 mg into the skin every 14 (fourteen) days. 6 mL 3   aspirin EC 81 MG tablet Take 1 tablet (81 mg total) by mouth daily.     BD PEN NEEDLE NANO 2ND GEN 32G X 4 MM MISC USE AS DIRECTED FOR INSULIN ADMINISTRATION (Patient taking differently: 1 each by Other route as directed. For insulin administration) 100 each 3    Blood Glucose Monitoring Suppl (ONETOUCH VERIO) w/Device KIT Use to check blood sugar 2 times a day (Patient taking differently: 1 each by Other route See admin instructions. Use to check blood sugar 2 times a day) 1 kit 0   BREO ELLIPTA 200-25 MCG/INH AEPB Inhale 1 puff into the lungs at bedtime. 3 each 3   carvedilol (COREG) 6.25 MG tablet Take 6.25 mg by mouth 2 (two) times daily with a meal.     clopidogrel (PLAVIX) 75 MG tablet TAKE 1 TABLET BY MOUTH DAILY WITH BREAKFAST. 90 tablet 2   Coenzyme Q10 300 MG CAPS Take 300 mg by mouth daily.     Cranberry 300 MG tablet Take 300 mg by mouth every evening.     fenofibrate (TRICOR) 145 MG tablet TAKE 1 TABLET (145 MG TOTAL) BY MOUTH EVERY EVENING. 90 tablet 1   Glucosamine-Chondroit-Vit C-Mn (GLUCOSAMINE CHONDR 1500 COMPLX PO) Take 1 tablet by mouth daily.     glucose blood (ONETOUCH VERIO) test strip Use to check blood sugar 2 times a day 200 each 12   icosapent Ethyl (VASCEPA) 1 g capsule TAKE 2 CAPSULES (2 G TOTAL) BY MOUTH 2 (TWO) TIMES DAILY. 360 capsule 1   insulin detemir (LEVEMIR FLEXTOUCH) 100 UNIT/ML FlexPen Inject 50 Units into the skin at bedtime. 45 mL 3   isosorbide mononitrate (IMDUR) 60 MG 24 hr tablet Take 1 tablet (60 mg total) by mouth daily. ** DO NOT CRUSH ** 90 tablet 3   metFORMIN (GLUCOPHAGE) 1000 MG tablet Take 1 tablet (1,000 mg total) by mouth 2 (two) times daily with a meal. 180 tablet 3   Multiple Vitamins-Minerals (CENTRUM SILVER 50+WOMEN PO) Take 1 tablet by mouth daily.     ReliOn Lancets Thin 26G MISC 1 Units by Does not apply route 2 (two) times daily. E 11.9 100 each 12   rosuvastatin (CRESTOR) 20 MG tablet TAKE 1 TABLET BY MOUTH EVERY DAY 90 tablet 3   Semaglutide, 1 MG/DOSE, (OZEMPIC, 1 MG/DOSE,) 4 MG/3ML SOPN Inject 1 mg into the skin once a week. (Patient taking differently: Inject 0.75 mg into the skin once a week.) 9 mL 3   sharps container For disposing of insulin pen needles 1 each 2   valsartan (DIOVAN) 160  MG tablet Take 1 tablet (160 mg total) by mouth daily. 90 tablet 3   bisoprolol (ZEBETA) 10 MG tablet  Take 1 tablet (10 mg total) by mouth daily. (Patient not taking: Reported on 04/05/2021) 90 tablet 1   glipiZIDE (GLUCOTROL) 5 MG tablet Take 0.5-1 tablets (2.5-5 mg total) by mouth daily before supper. (Patient not taking: Reported on 04/05/2021) 90 tablet 3   hydrocortisone 2.5 % cream as needed. (Patient not taking: Reported on 04/05/2021)     meclizine (ANTIVERT) 25 MG tablet Take 1 tablet (25 mg total) by mouth 3 (three) times daily as needed for dizziness. (Patient not taking: Reported on 04/05/2021) 30 tablet 1   nitroGLYCERIN (NITROSTAT) 0.4 MG SL tablet PLACE 1 TABLET (0.4 MG TOTAL) UNDER THE TONGUE EVERY 5 (FIVE) MINUTES AS NEEDED FOR CHEST PAIN (UP TO 3 DOSES). 75 tablet 1   nystatin cream (MYCOSTATIN) Apply topically as needed. (Patient not taking: Reported on 04/05/2021)     Phenyleph-CPM-DM-APAP (ALKA-SELTZER PLUS COLD & COUGH PO) Take by mouth. (Patient not taking: Reported on 04/05/2021)     No facility-administered medications prior to visit.     Allergies:   Penicillins, Coconut (cocos nucifera) allergy skin test, Coconut flavor, and Other   Social History   Socioeconomic History   Marital status: Married    Spouse name: Not on file   Number of children: 1   Years of education: Not on file   Highest education level: Not on file  Occupational History    Employer: [employer]  Tobacco Use   Smoking status: Never   Smokeless tobacco: Never  Vaping Use   Vaping Use: Never used  Substance and Sexual Activity   Alcohol use: No   Drug use: No   Sexual activity: Not Currently  Other Topics Concern   Not on file  Social History Narrative   Not on file   Social Determinants of Health   Financial Resource Strain: Not on file  Food Insecurity: Not on file  Transportation Needs: Not on file  Physical Activity: Not on file  Stress: Not on file  Social  Connections: Not on file     ROS:   Please see the history of present illness.    ROS  All other systems are reviewed and are negative.   PHYSICAL EXAM:   VS:  BP (!) 179/85 (BP Location: Left Arm, Patient Position: Sitting, Cuff Size: Normal)   Pulse 84   Ht 5' 4" (1.626 m)   Wt 178 lb 12.8 oz (81.1 kg)   SpO2 99%   BMI 30.69 kg/m    Recheck blood pressure slightly improved at 159/91.  General: Alert, oriented x3, no distress, borderline obese Head: no evidence of trauma, PERRL, EOMI, no exophtalmos or lid lag, no myxedema, no xanthelasma; normal ears, nose and oropharynx Neck: normal jugular venous pulsations and no hepatojugular reflux; brisk carotid pulses without delay and no carotid bruits Chest: clear to auscultation, no signs of consolidation by percussion or palpation, normal fremitus, symmetrical and full respiratory excursions Cardiovascular: normal position and quality of the apical impulse, regular rhythm, normal first and second heart sounds, no murmurs, rubs or gallops Abdomen: no tenderness or distention, no masses by palpation, no abnormal pulsatility or arterial bruits, normal bowel sounds, no hepatosplenomegaly Extremities: no clubbing, cyanosis or edema; 2+ radial, ulnar and brachial pulses bilaterally; 2+ right femoral, posterior tibial and dorsalis pedis pulses; 2+ left femoral, posterior tibial and dorsalis pedis pulses; no subclavian or femoral bruits Neurological: grossly nonfocal Psych: Normal mood and affect    Wt Readings from Last 3 Encounters:  04/05/21 178 lb  12.8 oz (81.1 kg)  03/15/21 179 lb (81.2 kg)  02/19/21 179 lb (81.2 kg)    Studies/Labs Reviewed:   ECHO 10/30/2019:    1. Left ventricular ejection fraction, by estimation, is 55 to 60%. The  left ventricle has normal function. The left ventricle has no regional  wall motion abnormalities. Left ventricular diastolic parameters are  indeterminate.   2. Right ventricular systolic function  is normal. The right ventricular  size is normal. There is normal pulmonary artery systolic pressure.   3. The mitral valve is normal in structure. Trivial mitral valve  regurgitation. No evidence of mitral stenosis.   4. The aortic valve is tricuspid. Aortic valve regurgitation is not  visualized. Mild aortic valve stenosis.   5. The inferior vena cava is normal in size with greater than 50%  respiratory variability, suggesting right atrial pressure of 3 mmHg.     12/02/2019 ECHO  1. Left ventricular ejection fraction, by estimation, is 55 to 60%. The  left ventricle has normal function. The left ventricle has no regional  wall motion abnormalities.   2. Right ventricular systolic function is normal. The right ventricular  size is normal.   3. The mitral valve is grossly normal. Trivial mitral valve  regurgitation. No evidence of mitral stenosis.   4. The inferior vena cava is normal in size with greater than 50%  respiratory variability, suggesting right atrial pressure of 3 mmHg.   EKG: Ordered today and personally reviewed shows normal sinus rhythm completely normal tracing.  Her ECGs have never really been helpful when she has had acute coronary syndrome, they always look normal  Recent Labs: 08/28/2020: Hemoglobin 10.1; Platelets 195 01/28/2021: ALT 27; BUN 36; Creatinine, Ser 1.92; Magnesium 1.9; Potassium 5.6; Sodium 138   Lipid Panel    Component Value Date/Time   CHOL 83 (L) 08/20/2020 1024   TRIG 270 (H) 08/20/2020 1024   HDL 22 (L) 08/20/2020 1024   CHOLHDL 3.8 08/20/2020 1024   CHOLHDL 6.7 12/02/2019 0336   VLDL UNABLE TO CALCULATE IF TRIGLYCERIDE OVER 400 mg/dL 12/02/2019 0336   LDLCALC 21 08/20/2020 1024   LDLDIRECT 93 11/15/2012 0829    ASSESSMENT:    1. Palpitations   2. Coronary artery disease of native artery of native heart with stable angina pectoris ([place])   3. Mild persistent asthma without complication   4. Mixed hyperlipidemia   5. Aortic valve  stenosis, nonrheumatic   6. Essential hypertension   7. Diabetes mellitus type 2 in obese ([place])   8. Stage 3b chronic kidney disease ([place])   9. Overweight   10. History of vertigo   11. Hyperkalemia    PLAN:  In order of problems listed above:  CAD:   No recent problems with chest pain.  In the past she has had occasional mild symptoms at rest, that have not required nitroglycerin.  There has always been suspicion for possible vasospastic angina.  Controlled on beta-blocker plus long-acting nitrates, we have not needed to add Ranexa.  Unfortunately, it sounds like the nonselective carvedilol may be worsening her reactive airway disease.  Transitioning to bisoprolol tomorrow. Palpitations: Increased in frequency since the COVID-19 infection and still persistent a month later.  We will check a 72-hour monitor.  Encouraged her to get a commercially available wearable device or cardia mobile device to monitor arrhythmia in the future. Asthma: Paradoxically, her wheezing is better spontaneously, since last appointment.  We will go ahead with a plan to switch from carvedilol  to bisoprolol which is more beta-1 selective.  The disadvantage may be worsening of the vasospastic angina with loss of alpha blockade.  Another option would be to change nebivolol, if she develops worsening symptoms suggestive of vasospastic angina on the pure beta-1 blocker (for now she prefers to try the less expensive option). HLP: Excellent LDL, but highly unfavorable HDL level.  She has multiple family members with early onset CAD including an uncle who passed away at age 57 and an uncle that required bypass surgery at 44. She is on a very aggressive lipid-lowering therapy with PCSK9 inhibitor, high-dose statin, Vascepa, fenofibrate.   AS: Mild by echo in July 2021.  Plan to repeat her echocardiogram in 2024 unless she develops symptoms of aortic stenosis. HTN:  this is the second appointment where her blood pressure has been  remarkably high but she is often emotional when she comes since we talk about her recently deceased husband.  We will get her a blood pressure cuff so she can check it more frequently at home. DM: A1c has worsened a little bit but is still quite acceptable at 7.0 %.  If possible, the goal will be to try to get her off insulin and insulin secretagogues.  Prefer GLP-1 agonist that she is taking and maybe SGLT2 inhibitors if she can tolerate those, since they improve cardiovascular prognosis. CKD 3B: She has had a couple of events in the last 3 years where she had acute kidney injury with a creatinine that increased to 2.5-3.0 range, most recently due to her episode of vertigo a few months ago.  It appears that her new creatinine baseline is around 1.3-1.4, which would correspond to a GFR of around 45.  The most recent creatinine was 1.92 on 01/28/2021 and her potassium was also borderline high at 5.6.  Consider possibility that she has RTA type IV. Overweight/mildly obese: Steady BMI around 30. BPV: This was severe enough to cause hospitalization for a couple of days in early May, but has resolved.  She is aware of Epley maneuvers.  Usually improves promptly when she starts meclizine. Muscle cramps: Stay well-hydrated.  Try magnesium oxide 400 mg daily.  She should not take a potassium supplement as her potassium has a tendency to run in the high normal range (RTA type IV?) and she is on an ARB without taking diuretics.   Medication Adjustments/Labs and Tests Ordered: Current medicines are reviewed at length with the patient today.  Concerns regarding medicines are outlined above.  Medication changes, Labs and Tests ordered today are listed in the Patient Instructions below. Patient Instructions  Medication Instructions:  No changes *If you need a refill on your cardiac medications before your next appointment, please call your pharmacy*   Lab Work: None ordered If you have labs (blood work) drawn  today and your tests are completely normal, you will receive your results only by: [facility] (if you have MyChart) OR A paper copy in the mail If you have any lab test that is abnormal or we need to change your treatment, we will call you to review the results.   Testing/Procedures: [facility] [place]- Long Term Monitor Instructions  Your physician has requested you wear a ZIO patch monitor for 3 days.  This is a single patch monitor. [employer] supplies one patch monitor per enrollment. Additional stickers are not available. Please do not apply patch if you will be having a Nuclear Stress Test,  Echocardiogram, Cardiac CT, MRI, or Chest Xray during the period  you would be wearing the  monitor. The patch cannot be worn during these tests. You cannot remove and re-apply the  ZIO XT patch monitor.  Your ZIO patch monitor will be mailed 3 day USPS to your address on file. It may take 3-5 days  to receive your monitor after you have been enrolled.  Once you have received your monitor, please review the enclosed instructions. Your monitor  has already been registered assigning a specific monitor serial # to you.  Billing and Patient Assistance Program Information  We have supplied [employer] with any of your insurance information on file for billing purposes. [employer] offers a sliding scale Patient Assistance Program for patients that do not have  insurance, or whose insurance does not completely cover the cost of the ZIO monitor.  You must apply for the Patient Assistance Program to qualify for this discounted rate.  To apply, please call [employer] at [phone], select option 4, select option 2, ask to apply for  Patient Assistance Program. Theodore Demark will ask your household income, and how many people  are in your household. They will quote your out-of-pocket cost based on that information.  [employer] will also be able to set up a 44-month interest-free payment plan if needed.  Applying the monitor    Shave hair from upper left chest.  Hold abrader disc by orange tab. Rub abrader in 40 strokes over the upper left chest as  indicated in your monitor instructions.  Clean area with 4 enclosed alcohol pads. Let dry.  Apply patch as indicated in monitor instructions. Patch will be placed under collarbone on left  side of chest with arrow pointing upward.  Rub patch adhesive wings for 2 minutes. Remove white label marked "1". Remove the white  label marked "2". Rub patch adhesive wings for 2 additional minutes.  While looking in a mirror, press and release button in center of patch. A small green light will  flash 3-4 times. This will be your only indicator that the monitor has been turned on.  Do not shower for the first 24 hours. You may shower after the first 24 hours.  Press the button if you feel a symptom. You will hear a small click. Record Date, Time and  Symptom in the Patient Logbook.  When you are ready to remove the patch, follow instructions on the last 2 pages of Patient  Logbook. Stick patch monitor onto the last page of Patient Logbook.  Place Patient Logbook in the blue and white box. Use locking tab on box and tape box closed  securely. The blue and white box has prepaid postage on it. Please place it in the mailbox as  soon as possible. Your physician should have your test results approximately 7 days after the  monitor has been [facility]  Call IAndoverat [phone] you have questions regarding  your ZIO XT patch monitor. Call them immediately if you see an orange light blinking on your  monitor.  If your monitor falls off in less than 4 days, contact our Monitor department at [phone]  If your monitor becomes loose or falls off after 4 days call [employer] at [phone]  suggestions on securing your monitor    Follow-Up: [facility] and your health needs are our priority.  As part of our continuing  mission to provide you with exceptional heart care, we have created designated Provider Care Teams.  These Care Teams include your primary  Cardiologist (physician) and Advanced Practice Providers (APPs -  Physician Assistants and Nurse Practitioners) who all work together to provide you with the care you need, when you need it.  We recommend signing up for the patient portal called "MyChart".  Sign up information is provided on this After Visit Summary.  MyChart is used to connect with patients for Virtual Visits (Telemedicine).  Patients are able to view lab/test results, encounter notes, upcoming appointments, etc.  Non-urgent messages can be sent to your provider as well.   To learn more about what you can do with MyChart, go to [email or web address].    Your next appointment:   3 month(s)  The format for your next appointment:   In Person  Provider:   Sanda Klein, MD        Signed, Sanda Klein, MD  04/05/2021 5:48 PM    [address], [employer], [place]  [address] Phone: [phone]; Fax: [phone]

## 2021-04-05 NOTE — Progress Notes (Unsigned)
Enrolled for [employer] to mail a ZIO XT long term holter monitor to the patients address on file.  

## 2021-04-06 ENCOUNTER — Ambulatory Visit: Payer: BC Managed Care – PPO | Admitting: Internal Medicine

## 2021-04-07 LAB — HM DIABETES EYE EXAM

## 2021-04-12 ENCOUNTER — Encounter: Payer: Self-pay | Admitting: Internal Medicine

## 2021-04-12 ENCOUNTER — Other Ambulatory Visit: Payer: Self-pay

## 2021-04-12 ENCOUNTER — Ambulatory Visit (INDEPENDENT_AMBULATORY_CARE_PROVIDER_SITE_OTHER): Payer: BC Managed Care – PPO | Admitting: Internal Medicine

## 2021-04-12 VITALS — BP 132/90 | HR 82 | Ht 64.0 in | Wt 177.0 lb

## 2021-04-12 DIAGNOSIS — E1159 Type 2 diabetes mellitus with other circulatory complications: Secondary | ICD-10-CM | POA: Diagnosis not present

## 2021-04-12 DIAGNOSIS — Z794 Long term (current) use of insulin: Secondary | ICD-10-CM | POA: Diagnosis not present

## 2021-04-12 DIAGNOSIS — E6609 Other obesity due to excess calories: Secondary | ICD-10-CM | POA: Diagnosis not present

## 2021-04-12 DIAGNOSIS — E1169 Type 2 diabetes mellitus with other specified complication: Secondary | ICD-10-CM

## 2021-04-12 DIAGNOSIS — E785 Hyperlipidemia, unspecified: Secondary | ICD-10-CM

## 2021-04-12 DIAGNOSIS — Z6834 Body mass index (BMI) 34.0-34.9, adult: Secondary | ICD-10-CM

## 2021-04-12 NOTE — Patient Instructions (Signed)
Please continue: - Metformin 1000 mg 2x a day with meals - Glipizide 2.5 to 5 mg before a large meal - Levemir 50 units at bedtime  Try to increase: - Ozempic 1 mg weekly  Please come back for a follow-up appointment in 4 months.

## 2021-04-12 NOTE — Progress Notes (Signed)
Patient ID: CATHERIN DOORN, female   DOB: 1956/01/06, 65 y.o.   MRN: [id number]   This visit occurred during the SARS-CoV-2 public health emergency.  Safety protocols were in place, including screening questions prior to the visit, additional usage of staff PPE, and extensive cleaning of exam room while observing appropriate contact time as indicated for disinfecting solutions.   HPI: TENECIA IGNASIAK is a 65 y.o.-year-old female, initially referred by her PCP, Dr. Redmond School, returning for follow-up for DM2, dx initially in 1997 2/2 liver failure, then GDM, then re-occuring in 2007, insulin-dependent, uncontrolled, with long-term complications (CAD-s/p NSTEMI in 11/2019, s/p stents, aortic valve stenosis; CKD).  Last visit 4 mo ago.  Interim history: She denies blurry vision.  She had vertigo before last visit, improved on meclizine.  She has frequent UTIs-drinking cranberry juice. She had Covid in 02/2021 >> could not eat and had GI issues for 3 weeks afterward. She had palpitations after Covid >> now improved. She saw Dr. Sallyanne Kuster recently. She will start a Holter monitor. No CP.   Reviewed HbA1c levels: Lab Results  Component Value Date   HGBA1C 7.0 (A) 01/28/2021   HGBA1C 6.3 (A) 12/08/2020   HGBA1C 7.4 (H) 08/20/2020   HGBA1C 7.0 (A) 07/28/2020   HGBA1C 6.2 (A) 01/28/2020   HGBA1C 6.6 (H) 12/02/2019   HGBA1C 6.4 (H) 10/22/2019   HGBA1C 6.3 (A) 01/29/2019   HGBA1C 9.2 (A) 08/30/2018   HGBA1C 8.8 (A) 03/27/2018  05/01/2019: HbA1c 6.2% She was on steroid injections in 2019.  Pt is on a regimen of: - Metformin 1000 mg 2x a day, with meals - Glipizide 2.5-5 mg before a meal - added 01/2019 -before a large meals - Levemir 140 (70 units x2) units at bedtime >> 70 units >> ...45 >> 50 units at bedtime   - Ozempic 0.5 mg weekly-added 09/2018 >> 1 mg weekly >> decreased to 0.75 mg weekly 4 weeks ago due to gastric discomfort/nausea She was on Actos before.  Pt checks her sugars 2x a day: - am:  79, 81-101 >> 83-96, 132, 137 >> 131-162, 180 >> 98-125 >> 94-126 - 2h after b'fast: 129-148 >> n/c >> 163-188, 218 >> 140, 168 >> 128-131 - before lunch: n/c >> 160, 183 >> 139, 146 >> n/c - 2h after lunch: 102, 149, 183 (no glipizide) >> 190 >> n/c >> 141-169 - before dinner: n/c >> 142-173 >> 131-162 >> 132 - 2h after dinner: 149, 170, 182 >> 194-219 >> 129-168 >>139-170, 248 (ate out) - bedtime: n/c >> 99-137, 152 >> 146-192 >> n/c >> 141-189 - nighttime: n/c Lowest sugar was 40 - 2012 >> ...  79 >> 89 >> 131 >> 98; she has hypoglycemia awareness in the 70s. Highest sugar was 368 ... >> 183 >> 219 >> 168 >> 306 (party - no glipizide).  Glucometer: ReliOn  Pt's meals are: - Breakfast: if not skipping - egg + whole wheat toast; cheerios + milk - Lunch: chicken salad, apple, veggies, trisquits  - Dinner: meat + 2 veggies +/- rolls  In the past, she was on Lawson for autoimmune hepatitis.  She could not tolerate it and had to stop.  She lost 24 pounds on it.  -+ CKD, last BUN/creatinine:  Lab Results  Component Value Date   BUN 36 (H) 01/28/2021   BUN 26 (H) 08/28/2020   CREATININE 1.92 (H) 01/28/2021   CREATININE 1.67 (H) 08/28/2020  On Diovan 160.  -+ HL; last set of lipids: Lab  Results  Component Value Date   CHOL 83 (L) 08/20/2020   HDL 22 (L) 08/20/2020   LDLCALC 21 08/20/2020   LDLDIRECT 93 11/15/2012   TRIG 270 (H) 08/20/2020   CHOLHDL 3.8 08/20/2020  On Crestor 20, fenofibrate, Vascepa.  Also on Praluent started 12/2018.  - last eye exam was in 03/2021: No DR. Dr. Katy Fitch.   - no numbness and tingling in her feet.  Pt has FH of DM in M, B, M uncle, MGM.  She was admitted for chest pain 10/2018 after an episode of dehydration and had heart catheterization at that time >> had another stent and the previous stent was recanalized. She has a history of autoimmune hepatitis. Also, psoriasis, diagnosed as a child. She lost a signif. amount of weight on [employer] in  2018.    ROS: + see HPI  I reviewed pt's medications, allergies, PMH, social hx, family hx, and changes were documented in the history of present illness. Otherwise, unchanged from my initial visit note.  Past Medical History:  Diagnosis Date   Asthma    Cancer ([place])    basal cell chest   Complication of anesthesia    Coronary artery disease    Diabetes mellitus without complication ([place])    type 2   Heart murmur    Hepatitis    auto- immune   History of kidney stones    History of nuclear stress test 05/21/2010   dipyridamole; normal perfusion, preserved LV systolic EF of 56%   Hyperlipidemia    Hypertension    Liver failure ([place])    h/o autoimmune hepatitis    Myocardial infarction ([place])    Neuropathy    resolved with back surgery; tingling and burning left leg to foot   Obesity    PONV (postoperative nausea and vomiting)    Psoriasis    Tubular adenoma of colon    colon path 01/09/2017   Past Surgical History:  Procedure Laterality Date   Carotid Doppler  10/2008   R & L ICAs 0-49% diameter reduction    COLONOSCOPY     CORONARY ANGIOPLASTY WITH STENT PLACEMENT  09/2008   2.25x38m Cypher DES to in-stent restenosis of prox LAD; Mini-Vision stent x2 2.0x137mto area distal of initial LAD stent; 3 2.5x1522maxus stents prox to Cypher stent in circumflex   CORONARY ANGIOPLASTY WITH STENT PLACEMENT  09/2005   Cypher DES 3.0x22m74m distal RCA; 2.25x12mm95mi0Vision stent to mid LAD; 2.5x18mm 53mer stent to circumflex   CORONARY STENT INTERVENTION N/A 11/13/2018   Procedure: CORONARY STENT INTERVENTION;  Surgeon: VaranaJettie Booze [facility];  Service: Cardiovascular;  Laterality: N/A;   CORONARY STENT INTERVENTION N/A 12/02/2019   Procedure: CORONARY STENT INTERVENTION;  Surgeon: End, CNelva Bush [facility];  Service: Cardiovascular;  Laterality: N/A;   LEFT HEART CATH AND CORONARY ANGIOGRAPHY N/A 11/13/2018   Procedure: LEFT  HEART CATH AND CORONARY ANGIOGRAPHY;  Surgeon: VaranaJettie Booze [facility];  Service: Cardiovascular;  Laterality: N/A;   LEFT HEART CATH AND CORONARY ANGIOGRAPHY N/A 12/02/2019   Procedure: LEFT HEART CATH AND CORONARY ANGIOGRAPHY;  Surgeon: End, CNelva Bush [facility];  Service: Cardiovascular;  Laterality: N/A;   LUMBAR LAMINECTOMY/DECOMPRESSION MICRODISCECTOMY Left 10/18/2016   Procedure: Left Lumbar Four-Five Microdiscectomy;  Surgeon: Stern,Erline Levine Location: [facility];[place]ice: Neurosurgery;  Laterality: Left;  Left L4-5 Microdiscectomy   ROTATOR  CUFF REPAIR Left    SHOULDER ARTHROSCOPY W/ ROTATOR CUFF REPAIR Right    TRANSTHORACIC ECHOCARDIOGRAM  04/2010   EF=>55%, borderline conc LVH; trace MR & TR; AV mildly sclerotic; aortic root sclerosis/calcif   TRIGGER FINGER RELEASE Left 02/19/2020   Procedure: RELEASE TRIGGER FINGER/A-1 PULLEY;  Surgeon: Earlie Server, MD;  Location: [facility];  Service: Orthopedics;  Laterality: Left;   ureteral stents     Social History   Socioeconomic History   Marital status: Single    Spouse name: Not on file   Number of children: 1   Years of education: Not on file   Highest education level: Not on file  Occupational History    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  Social Needs   Financial resource strain: Not on file   Food insecurity    Worry: Not on file    Inability: Not on file   Transportation needs    Medical: Not on file    Non-medical: Not on file  Tobacco Use   Smoking status: Never Smoker   Smokeless tobacco: Never Used  Substance and Sexual Activity   Alcohol use: No   Drug use: No   Sexual activity: Not Currently  Lifestyle   Physical activity    Days per week: Not on file    Minutes per session: Not on file   Stress: Not on file  Relationships   Social connections    Talks on phone: Not on file    Gets together: Not on file     Attends religious service: Not on file    Active member of club or organization: Not on file    Attends meetings of clubs or organizations: Not on file    Relationship status: Not on file   Intimate partner violence    Fear of current or ex partner: Not on file    Emotionally abused: Not on file    Physically abused: Not on file    Forced sexual activity: Not on file  Other Topics Concern   Not on file  Social History Narrative   Not on file   Current Outpatient Medications on File Prior to Visit  Medication Sig Dispense Refill   Alirocumab (PRALUENT) 150 MG/ML SOAJ Inject 150 mg into the skin every 14 (fourteen) days. 6 mL 3   aspirin EC 81 MG tablet Take 1 tablet (81 mg total) by mouth daily.     BD PEN NEEDLE NANO 2ND GEN 32G X 4 MM MISC USE AS DIRECTED FOR INSULIN ADMINISTRATION (Patient taking differently: 1 each by Other route as directed. For insulin administration) 100 each 3   bisoprolol (ZEBETA) 10 MG tablet Take 1 tablet (10 mg total) by mouth daily. (Patient not taking: Reported on 04/05/2021) 90 tablet 1   Blood Glucose Monitoring Suppl (ONETOUCH VERIO) w/Device KIT Use to check blood sugar 2 times a day (Patient taking differently: 1 each by Other route See admin instructions. Use to check blood sugar 2 times a day) 1 kit 0   BREO ELLIPTA 200-25 MCG/INH AEPB Inhale 1 puff into the lungs at bedtime. 3 each 3   carvedilol (COREG) 6.25 MG tablet Take 6.25 mg by mouth 2 (two) times daily with a meal.     clopidogrel (PLAVIX) 75 MG tablet TAKE 1 TABLET BY MOUTH DAILY WITH BREAKFAST. 90 tablet 2   Coenzyme Q10 300 MG CAPS Take 300 mg by mouth daily.     Cranberry 300 MG tablet  Take 300 mg by mouth every evening.     fenofibrate (TRICOR) 145 MG tablet TAKE 1 TABLET (145 MG TOTAL) BY MOUTH EVERY EVENING. 90 tablet 1   glipiZIDE (GLUCOTROL) 5 MG tablet Take 0.5-1 tablets (2.5-5 mg total) by mouth daily before supper. (Patient not taking: Reported on 04/05/2021) 90 tablet 3    Glucosamine-Chondroit-Vit C-Mn (GLUCOSAMINE CHONDR 1500 COMPLX PO) Take 1 tablet by mouth daily.     glucose blood (ONETOUCH VERIO) test strip Use to check blood sugar 2 times a day 200 each 12   hydrocortisone 2.5 % cream as needed. (Patient not taking: Reported on 04/05/2021)     icosapent Ethyl (VASCEPA) 1 g capsule TAKE 2 CAPSULES (2 G TOTAL) BY MOUTH 2 (TWO) TIMES DAILY. 360 capsule 1   insulin detemir (LEVEMIR FLEXTOUCH) 100 UNIT/ML FlexPen Inject 50 Units into the skin at bedtime. 45 mL 3   isosorbide mononitrate (IMDUR) 60 MG 24 hr tablet Take 1 tablet (60 mg total) by mouth daily. ** DO NOT CRUSH ** 90 tablet 3   meclizine (ANTIVERT) 25 MG tablet Take 1 tablet (25 mg total) by mouth 3 (three) times daily as needed for dizziness. (Patient not taking: Reported on 04/05/2021) 30 tablet 1   metFORMIN (GLUCOPHAGE) 1000 MG tablet Take 1 tablet (1,000 mg total) by mouth 2 (two) times daily with a meal. 180 tablet 3   Multiple Vitamins-Minerals (CENTRUM SILVER 50+WOMEN PO) Take 1 tablet by mouth daily.     nitroGLYCERIN (NITROSTAT) 0.4 MG SL tablet PLACE 1 TABLET (0.4 MG TOTAL) UNDER THE TONGUE EVERY 5 (FIVE) MINUTES AS NEEDED FOR CHEST PAIN (UP TO 3 DOSES). 75 tablet 1   nystatin cream (MYCOSTATIN) Apply topically as needed. (Patient not taking: Reported on 04/05/2021)     Phenyleph-CPM-DM-APAP (ALKA-SELTZER PLUS COLD & COUGH PO) Take by mouth. (Patient not taking: Reported on 04/05/2021)     ReliOn Lancets Thin 26G MISC 1 Units by Does not apply route 2 (two) times daily. E 11.9 100 each 12   rosuvastatin (CRESTOR) 20 MG tablet TAKE 1 TABLET BY MOUTH EVERY DAY 90 tablet 3   Semaglutide, 1 MG/DOSE, (OZEMPIC, 1 MG/DOSE,) 4 MG/3ML SOPN Inject 1 mg into the skin once a week. (Patient taking differently: Inject 0.75 mg into the skin once a week.) 9 mL 3   sharps container For disposing of insulin pen needles 1 each 2   valsartan (DIOVAN) 160 MG tablet Take 1 tablet (160 mg total) by mouth daily. 90  tablet 3   No current facility-administered medications on file prior to visit.   Allergies  Allergen Reactions   Penicillins Anaphylaxis and Hives     PATIENT HAD A PCN REACTION WITH IMMEDIATE RASH, FACIAL/TONGUE/THROAT SWELLING, SOB, OR LIGHTHEADEDNESS WITH HYPOTENSION:  #  #  #  YES  #  #  #   Has patient had a PCN reaction causing severe rash involving mucus membranes or skin necrosis:Unknown Has patient had a PCN reaction that required hospitalization:No Has patient had a PCN reaction occurring within the last 10 years:No If all of the above answers are "NO", then may proceed with Cephalosporin use.    Coconut (Cocos Nucifera) Allergy Skin Test     Breaks out mouth   Coconut Flavor Swelling    Mouth breaks out    Other Swelling    Walnuts cause mouth to break out   Family History  Problem Relation Age of Onset   CAD Mother    CAD Father  Diabetes Brother    PE: BP 132/90 (BP Location: Left Arm, Patient Position: Sitting, Cuff Size: Normal)    Pulse 82    Ht _0  (1.626 m)    Wt 177 lb (80.3 kg)    SpO2 99%    BMI 30.38 kg/m  Wt Readings from Last 3 Encounters:  04/12/21 177 lb (80.3 kg)  04/05/21 178 lb 12.8 oz (81.1 kg)  03/15/21 179 lb (81.2 kg)   Constitutional: overweight, in NAD Eyes: PERRLA, EOMI, no exophthalmos ENT: moist mucous membranes, no thyromegaly, no cervical lymphadenopathy Cardiovascular: RRR, No RG, +2/6 SEM Respiratory: CTA B Musculoskeletal: no deformities, strength intact in all 4 Skin: moist, warm, no rashes Neurological: no tremor with outstretched hands, DTR normal in all 4  ASSESSMENT: 1. DM2, insulin-dependent, uncontrolled, with long-term complications - CAD-s/p AMI, s/p stents, aortic valve stenosis - CKD  2. HL  3.  Obesity class I  PLAN:  1. Patient with longstanding, uncontrolled, type 2 diabetes, on oral antidiabetic regimen with metformin and sulfonylurea, and also on basal insulin and weekly GLP-1 receptor agonist with  fair control.  At last visit, she was drinking diluted cranberry juice and we discussed about possibly stopping this.  Also, she had to decrease the dose of Ozempic from 1 mg to 0.75 mg (using the 1 mg pen) weekly due to GI discomfort/nausea.  She was doing well on this dose at last visit.  She was occasionally forgetting glipizide when she was eating out trying to work on remembering this.  HbA1c at last visit was 6.3%.  We did not change her regimen.  She had another HbA1c obtained 2.5 months ago and this was higher, at 7.0%.   -At today's visit, sugars appear to be at goal throughout the day, with few mildly hyperglycemic exceptions, but the sugars can be quite high, in the 200s when she eats out.  She is reducing this.  As of now, she is wondering whether she can increase the Ozempic back to 1 mg weekly as she feels that she is tolerating the 0.75 mg well.  We can definitely do that and continue the rest of the regimen.  She did not have to use the glipizide but few times since last visit. - I suggested to:  Patient Instructions  Please continue: - Metformin 1000 mg 2x a day with meals - Glipizide 2.5 to 5 mg before a large meal - Levemir 50 units at bedtime  Try to increase: - Ozempic 1 mg weekly  Please come back for a follow-up appointment in 4 months.  - we checked her HbA1c: 6.8% (lower) - advised to check sugars at different times of the day - 1-2x a day, rotating check times - advised for yearly eye exams >> she is UTD - return to clinic in 4 months  2. HL -Reviewed latest lipid panel: Triglycerides elevated, LDL low, HDL also low: Lab Results  Component Value Date   CHOL 83 (L) 08/20/2020   HDL 22 (L) 08/20/2020   LDLCALC 21 08/20/2020   LDLDIRECT 93 11/15/2012   TRIG 270 (H) 08/20/2020   CHOLHDL 3.8 08/20/2020  -Continues on Crestor 20 mg daily, fenofibrate 145 mg daily, Vascepa 2 g twice a day and Praluent 150 mg every 2 weeks.  No side effects from this regimen.  3.   Obesity class I -We will continue Ozempic which should also help with weight loss -we will increase the dose at this visit -She lost 2 pounds before last  visit and 2 more since them  [name], MD PhD [facility] Endocrinology

## 2021-04-13 ENCOUNTER — Ambulatory Visit: Payer: BC Managed Care – PPO | Admitting: Cardiovascular Disease

## 2021-04-21 DIAGNOSIS — R002 Palpitations: Secondary | ICD-10-CM

## 2021-04-24 ENCOUNTER — Telehealth: Payer: Self-pay

## 2021-04-24 NOTE — Telephone Encounter (Signed)
P.A. VASCEPA  °

## 2021-05-13 ENCOUNTER — Other Ambulatory Visit: Payer: Self-pay | Admitting: Internal Medicine

## 2021-05-14 NOTE — Telephone Encounter (Signed)
P.A. approved til 04/24/24

## 2021-05-24 ENCOUNTER — Telehealth: Payer: Self-pay | Admitting: Cardiovascular Disease

## 2021-05-24 NOTE — Telephone Encounter (Signed)
Patient would like to speak to nurse, about a previous appt. Wouldn't go into further detail.

## 2021-05-24 NOTE — Telephone Encounter (Signed)
Spoke with patient who reports she wanted to speak with Lattie Haw. She had sent some mail correspondence to Dr. Loletha Grayer but it was returned to her and was undeliverable.   She asked to verify address. She had left off our office [address]  She did not need Lattie Haw to follow up on this  No further assistance needed

## 2021-06-10 ENCOUNTER — Other Ambulatory Visit: Payer: Self-pay | Admitting: Cardiovascular Disease

## 2021-06-10 ENCOUNTER — Other Ambulatory Visit: Payer: Self-pay | Admitting: Family Medicine

## 2021-06-10 DIAGNOSIS — I152 Hypertension secondary to endocrine disorders: Secondary | ICD-10-CM

## 2021-06-10 DIAGNOSIS — E1159 Type 2 diabetes mellitus with other circulatory complications: Secondary | ICD-10-CM

## 2021-06-14 ENCOUNTER — Other Ambulatory Visit: Payer: Self-pay | Admitting: Cardiovascular Disease

## 2021-07-14 ENCOUNTER — Ambulatory Visit: Payer: BC Managed Care – PPO | Admitting: Cardiovascular Disease

## 2021-07-14 ENCOUNTER — Other Ambulatory Visit: Payer: Self-pay

## 2021-07-14 ENCOUNTER — Encounter: Payer: Self-pay | Admitting: Cardiovascular Disease

## 2021-07-14 VITALS — BP 122/90 | HR 90 | Ht 64.0 in | Wt 178.8 lb

## 2021-07-14 DIAGNOSIS — I10 Essential (primary) hypertension: Secondary | ICD-10-CM

## 2021-07-14 DIAGNOSIS — I350 Nonrheumatic aortic (valve) stenosis: Secondary | ICD-10-CM

## 2021-07-14 DIAGNOSIS — I25118 Atherosclerotic heart disease of native coronary artery with other forms of angina pectoris: Secondary | ICD-10-CM | POA: Diagnosis not present

## 2021-07-14 DIAGNOSIS — E669 Obesity, unspecified: Secondary | ICD-10-CM

## 2021-07-14 DIAGNOSIS — N1832 Chronic kidney disease, stage 3b: Secondary | ICD-10-CM

## 2021-07-14 DIAGNOSIS — E1169 Type 2 diabetes mellitus with other specified complication: Secondary | ICD-10-CM

## 2021-07-14 DIAGNOSIS — R002 Palpitations: Secondary | ICD-10-CM | POA: Diagnosis not present

## 2021-07-14 DIAGNOSIS — J4530 Mild persistent asthma, uncomplicated: Secondary | ICD-10-CM | POA: Diagnosis not present

## 2021-07-14 DIAGNOSIS — E782 Mixed hyperlipidemia: Secondary | ICD-10-CM | POA: Diagnosis not present

## 2021-07-14 DIAGNOSIS — H8112 Benign paroxysmal vertigo, left ear: Secondary | ICD-10-CM

## 2021-07-14 MED ORDER — BISOPROLOL FUMARATE 5 MG PO TABS: ORAL_TABLET | ORAL | 6 refills | Status: DC

## 2021-07-14 NOTE — Patient Instructions (Addendum)
Medication Instructions:  ?INCREASE the Bisoprolol to 5 mg in the am and 10 mg in the evening ? ?*If you need a refill on your cardiac medications before your next appointment, please call your pharmacy* ? ? ?Lab Work: ?Your provider would like for you to have the following labs: CMET and Lipid ? ?If you have labs (blood work) drawn today and your tests are completely normal, you will receive your results only by: ?MyChart Message (if you have MyChart) OR ?A paper copy in the mail ?If you have any lab test that is abnormal or we need to change your treatment, we will call you to review the results. ? ? ?Testing/Procedures: ?None ordered ? ? ?Follow-Up: ?At [facility], you and your health needs are our priority.  As part of our continuing mission to provide you with exceptional heart care, we have created designated Provider Care Teams.  These Care Teams include your primary Cardiologist (physician) and Advanced Practice Providers (APPs -  Physician Assistants and Nurse Practitioners) who all work together to provide you with the care you need, when you need it. ? ?We recommend signing up for the patient portal called "MyChart".  Sign up information is provided on this After Visit Summary.  MyChart is used to connect with patients for Virtual Visits (Telemedicine).  Patients are able to view lab/test results, encounter notes, upcoming appointments, etc.  Non-urgent messages can be sent to your provider as well.   ?To learn more about what you can do with MyChart, go to [email or web address].   ? ?Your next appointment:   ?6 month(s) ? ?The format for your next appointment:   ?In Person ? ?Provider:   ?Sanda Klein, MD { ? ? ? ?

## 2021-07-14 NOTE — Progress Notes (Signed)
Patient ID: Haley George, female   DOB: [birth year], 67 y.o.   MRN: [id number] ?  ? ?Cardiology Office Note   ? ?Date:  07/15/2021  ? ?ID:  Haley George, DOB [birth year], MRN [id number] ? ?PCP:  Haley Lung, MD  ?Cardiologist:   Haley Klein, MD  ? ?Chief Complaint  ?Patient presents with  ? Coronary Artery Disease  ?CAD ? ?History of Present Illness:  ?Haley George is a 66 y.o. female with diabetes mellitus type II, severe mixed hyperlipidemia, previous percutaneous revascularization procedures performed in all 3 major coronary arteries and restenosis requiring repeat PCI, mild calcific aortic stenosis.  Most recent intervention was placement of 2 drug-eluting stents in the mid-distal left circumflex coronary artery covering a de novo mid vessel lesion and a distal in-stent restenosis lesion in the setting of small NSTEMI (peak troponin 1818) on 12/02/2019. ? ?Doing better.  Continues have occasional palpitations, but when checking with her Haley George device is in normal rhythm.  Not worried about them so much.  She has not had problems with chest pain and has not taken any nitroglycerin since her last appointment.  She is actually started playing pickle ball, although she does feel somewhat limited by dyspnea.  She has not had chest pain at rest, orthopnea, PND or lower extremity edema. ? ?We transitioned from carvedilol to bisoprolol due to worsening wheezing and her diastolic blood pressure is staying relatively high around 90.  Her resting heart rate is 90. ? ?Still has difficulty emotionally, 18 months after her husband, Haley George passed away.  Haley George, who did CPR on her father is having an even harder time, but she is completing her graduate education in [place]. ? ?It's been about 19 months since the placement of her last pair of drug-eluting stents.  She does have a history of in-stent restenosis. ? ?Metabolic control is good.  Hemoglobin A1c was most recently 7.0%.  She has an appoint with Dr. Cruzita Lederer next  month.  Her most recent LDL cholesterol was 21 on rosuvastatin plus Praluent.  She is also on Vascepa and fenofibrate for hypertriglyceridemia and her triglycerides remain rather high at 270.  HDL is only 22, chronic problem.  Rechecking labs this week, when she comes back fasting. ? ? ?She developed unstable angina and she underwent cardiac catheterization on December 02, 2019.  The culprit lesion was found to be a 95% in-stent restenosis in the mid left circumflex coronary artery, as well as a de novo 70% mid left circumflex stenosis at the OM2 bifurcation.  This was treated by placement of overlapping 2.5x12 and 2.25x20 drug-eluting Synergy stents (Dr. Saunders Revel).  Also noted was a mild-moderate mid to distal LAD in-stent restenosis and an unchanged 80% RPDA lesion.  She had complete relief of her angina following this intervention.   ? ? ? ?Previous coronary interventions ?2007  ?- mid LAD 2.25?12 mini vision ?- left circumflex 2.5?18 Cypher  ?- RCA 3.0?23 Cypher ? ?2010  ?- mid LAD 2.25?12 Taxus overlapping 2.0?12 mini vision, overlapping another 2.0?12 mini vision ?-  left circumflex 2.5?16 Taxus upstream of previous stent. ? ?July 2020  ?- mid LAD Synergy 2.5 x12 , overlapping the proximal area of restenosis in the mid LAD (short area of 3 stent layers). ? ?August 2021 ?- mid LCX overlapping 2.5x12 and 2.25x20 drug-eluting Synergy (the distal stents overlaps the previously placed Taxus stent from 2010) ? ?She had a normal nuclear stress test in November 2016. EF was 56%. She had  a "false positive" ECG response.  In July 2020 she had a moderate sized area of ischemia in the anteroseptal wall, EF 52%, Prior to placement of the mid LAD stent.  Echo in August 2021 showed normal left ventricular regional motion and EF 55-60%. ? ?Past Medical History:  ?Diagnosis Date  ? Asthma   ? Cancer [facility])   ? basal cell chest  ? Complication of anesthesia   ? Coronary artery disease   ? Diabetes mellitus without complication ([place])   ?  type 2  ? Heart murmur   ? Hepatitis   ? auto- immune  ? History of kidney stones   ? History of nuclear stress test 05/21/2010  ? dipyridamole; normal perfusion, preserved LV systolic EF of 25%  ? Hyperlipidemia   ? Hypertension   ? Liver failure ([place])   ? h/o autoimmune hepatitis   ? Myocardial infarction [facility])   ? Neuropathy   ? resolved with back surgery; tingling and burning left leg to foot  ? Obesity   ? PONV (postoperative nausea and vomiting)   ? Psoriasis   ? Tubular adenoma of colon   ? colon path 01/09/2017  ? ? ?Past Surgical History:  ?Procedure Laterality Date  ? Carotid Doppler  10/2008  ? R & L ICAs 0-49% diameter reduction   ? COLONOSCOPY    ? CORONARY ANGIOPLASTY WITH STENT PLACEMENT  09/2008  ? 2.25x26mm Cypher DES to in-stent restenosis of prox LAD; Mini-Vision stent x2 2.0x53mm to area distal of initial LAD stent; 3 2.5x39mm Taxus stents prox to Cypher stent in circumflex  ? CORONARY ANGIOPLASTY WITH STENT PLACEMENT  09/2005  ? Cypher DES 3.0x44mm to distal RCA; 2.25x6mm Mini0Vision stent to mid LAD; 2.5x59mm Cypher stent to circumflex  ? CORONARY STENT INTERVENTION N/A 11/13/2018  ? Procedure: CORONARY STENT INTERVENTION;  Surgeon: Jettie Booze, MD;  Location: [place] [facility];  Service: Cardiovascular;  Laterality: N/A;  ? CORONARY STENT INTERVENTION N/A 12/02/2019  ? Procedure: CORONARY STENT INTERVENTION;  Surgeon: Nelva Bush, MD;  Location: [place] [facility];  Service: Cardiovascular;  Laterality: N/A;  ? LEFT HEART CATH AND CORONARY ANGIOGRAPHY N/A 11/13/2018  ? Procedure: LEFT HEART CATH AND CORONARY ANGIOGRAPHY;  Surgeon: Jettie Booze, MD;  Location: [place] [facility];  Service: Cardiovascular;  Laterality: N/A;  ? LEFT HEART CATH AND CORONARY ANGIOGRAPHY N/A 12/02/2019  ? Procedure: LEFT HEART CATH AND CORONARY ANGIOGRAPHY;  Surgeon: Nelva Bush, MD;  Location: [place] [facility];  Service: Cardiovascular;  Laterality: N/A;  ? LUMBAR LAMINECTOMY/DECOMPRESSION  MICRODISCECTOMY Left 10/18/2016  ? Procedure: Left Lumbar Four-Five Microdiscectomy;  Surgeon: Erline Levine, MD;  Location: [place];  Service: Neurosurgery;  Laterality: Left;  Left L4-5 Microdiscectomy  ? ROTATOR CUFF REPAIR Left   ? SHOULDER ARTHROSCOPY W/ ROTATOR CUFF REPAIR Right   ? TRANSTHORACIC ECHOCARDIOGRAM  04/2010  ? EF=>55%, borderline conc LVH; trace MR & TR; AV mildly sclerotic; aortic root sclerosis/calcif  ? TRIGGER FINGER RELEASE Left 02/19/2020  ? Procedure: RELEASE TRIGGER FINGER/A-1 PULLEY;  Surgeon: Earlie Server, MD;  Location: [place];  Service: Orthopedics;  Laterality: Left;  ? ureteral stents    ? ? ?Outpatient Medications Prior to Visit  ?Medication Sig Dispense Refill  ? Alirocumab (PRALUENT) 150 MG/ML SOAJ Inject 150 mg into the skin every 14 (fourteen) days. 6 mL 3  ? aspirin EC 81 MG tablet Take 1 tablet (81 mg total) by mouth daily.    ? BD PEN NEEDLE NANO  2ND GEN 32G X 4 MM MISC USE AS DIRECTED FOR INSULIN ADMINISTRATION 100 each 3  ? Blood Glucose Monitoring Suppl (ONETOUCH VERIO) w/Device KIT Use to check blood sugar 2 times a day (Patient taking differently: 1 each by Other route See admin instructions. Use to check blood sugar 2 times a day) 1 kit 0  ? clopidogrel (PLAVIX) 75 MG tablet TAKE 1 TABLET BY MOUTH DAILY WITH BREAKFAST. 90 tablet 2  ? Coenzyme Q10 300 MG CAPS Take 300 mg by mouth daily.    ? fenofibrate (TRICOR) 145 MG tablet TAKE 1 TABLET (145 MG TOTAL) BY MOUTH EVERY EVENING. 90 tablet 1  ? glipiZIDE (GLUCOTROL) 5 MG tablet Take 0.5-1 tablets (2.5-5 mg total) by mouth daily before supper. 90 tablet 3  ? Glucosamine-Chondroit-Vit C-Mn (GLUCOSAMINE CHONDR 1500 COMPLX PO) Take 1 tablet by mouth daily.    ? glucose blood (ONETOUCH VERIO) test strip Use to check blood sugar 2 times a day 200 each 12  ? icosapent Ethyl (VASCEPA) 1 g capsule TAKE 2 CAPSULES (2 G TOTAL) BY MOUTH 2 (TWO) TIMES DAILY. 360 capsule 1  ? insulin detemir (LEVEMIR FLEXTOUCH) 100 UNIT/ML FlexPen Inject  50 Units into the skin at bedtime. 45 mL 3  ? isosorbide mononitrate (IMDUR) 60 MG 24 hr tablet Take 1 tablet (60 mg total) by mouth daily. ** DO NOT CRUSH ** 90 tablet 3  ? meclizine (ANTIVERT) 25 MG tabl

## 2021-07-15 ENCOUNTER — Encounter: Payer: Self-pay | Admitting: Cardiovascular Disease

## 2021-07-20 ENCOUNTER — Other Ambulatory Visit: Payer: Self-pay | Admitting: Cardiovascular Disease

## 2021-08-12 ENCOUNTER — Encounter: Payer: Self-pay | Admitting: Internal Medicine

## 2021-08-12 ENCOUNTER — Ambulatory Visit: Payer: BC Managed Care – PPO | Admitting: Internal Medicine

## 2021-08-12 VITALS — BP 140/92 | HR 82 | Ht 64.0 in | Wt 180.0 lb

## 2021-08-12 DIAGNOSIS — E1159 Type 2 diabetes mellitus with other circulatory complications: Secondary | ICD-10-CM

## 2021-08-12 DIAGNOSIS — E1169 Type 2 diabetes mellitus with other specified complication: Secondary | ICD-10-CM | POA: Diagnosis not present

## 2021-08-12 DIAGNOSIS — E785 Hyperlipidemia, unspecified: Secondary | ICD-10-CM

## 2021-08-12 DIAGNOSIS — E6609 Other obesity due to excess calories: Secondary | ICD-10-CM

## 2021-08-12 DIAGNOSIS — Z6834 Body mass index (BMI) 34.0-34.9, adult: Secondary | ICD-10-CM

## 2021-08-12 DIAGNOSIS — Z794 Long term (current) use of insulin: Secondary | ICD-10-CM

## 2021-08-12 LAB — POCT GLYCOSYLATED HEMOGLOBIN (HGB A1C): Hemoglobin A1C: 6.9 % — AB (ref 4.0–5.6)

## 2021-08-12 NOTE — Patient Instructions (Addendum)
Please continue: ?- Metformin 1000 mg 2x a day with meals ?- Glipizide 2.5-5 mg before a large meal ?- Levemir 50 units at bedtime ?- Ozempic 1 mg weekly ? ?Discuss with Dr. Loletha Grayer about possibly starting [place] or [place]. ? ?Please come back for a follow-up appointment in 4 months. ?

## 2021-08-12 NOTE — Progress Notes (Signed)
Patient ID: Haley George, female   DOB: Jan 04, 1956, 66 y.o.   MRN: [id number]  ? ?This visit occurred during the SARS-CoV-2 public health emergency.  Safety protocols were in place, including screening questions prior to the visit, additional usage of staff PPE, and extensive cleaning of exam room while observing appropriate contact time as indicated for disinfecting solutions.  ? ?HPI: ?Haley George is a 66 y.o.-year-old female, initially referred by her PCP, Haley George, returning for follow-up for DM2, dx initially in 1997 2/2 liver failure, then GDM, then re-occuring in 2007, insulin-dependent, uncontrolled, with long-term complications (CAD-s/p NSTEMI in 11/2019, s/p stents, aortic valve stenosis; CKD).  Last visit 4 mo ago. ? ?Interim history: ?No blurry vision. He has some nausea the next day after taking Ozempic. ?She has frequent UTIs-drinking cranberry juice. ?In the last month, she has more chest pressure and she feels that this may be related to her aortic stenosis. No shortness of breath.  He saw her cardiologist not long ago, but the symptoms started afterwards.  She is planning to contact him again.  She was advised that she needed aortic valve surgery.  However, she plans to delay this until she gets Medicare. ? ?Reviewed HbA1c levels: ?Lab Results  ?Component Value Date  ? HGBA1C 7.0 (A) 01/28/2021  ? HGBA1C 6.3 (A) 12/08/2020  ? HGBA1C 7.4 (H) 08/20/2020  ? HGBA1C 7.0 (A) 07/28/2020  ? HGBA1C 6.2 (A) 01/28/2020  ? HGBA1C 6.6 (H) 12/02/2019  ? HGBA1C 6.4 (H) 10/22/2019  ? HGBA1C 6.3 (A) 01/29/2019  ? HGBA1C 9.2 (A) 08/30/2018  ? HGBA1C 8.8 (A) 03/27/2018  ?05/01/2019: HbA1c 6.2% ?She was on steroid injections in 2019. ? ?Pt is on a regimen of: ?- Metformin 1000 mg 2x a day, with meals ?- Glipizide 2.5-5 mg before a meal - added 01/2019 -before a large meals >> not taking it ?- Levemir 140 (70 units x2) units at bedtime >> 70 units >> ...45 >> 50 units at bedtime   ?- Ozempic 0.5 mg weekly-added  09/2018 >> 1 mg weekly >> 0.75 mg weekly due to gastric discomfort/nausea >> 1 mg weekly ?She was on Actos before. ? ?Pt checks her sugars 2x a day: ?- am: 83-96, 132, 137 >> 131-162, 180 >> 98-125 >> 94-126 >> 89, 102-131 ?- 2h after b'fast: 163-188, 218 >> 140, 168 >> 128-131 >> 161, 249 (eating out) ?- before lunch: n/c >> 160, 183 >> 139, 146 >> n/c >> 129-190 ?- 2h after lunch: 102, 149, 183  >> 190 >> n/c >> 141-169 >> 133-209 ?- before dinner: n/c >> 142-173 >> 131-162 >> 132 >> 131-169 ?- 2h after dinner: 194-219 >> 129-168 >>139-170, 248 (ate out) >> 153-202 ?- bedtime: n/c >> 99-137, 152 >> 146-192 >> n/c >> 141-189 >> 140-171 ?- nighttime: n/c ?Lowest sugar was 40 - 2012 >> ...  79 >> 89 >> 131 >> 98>> 89; she has hypoglycemia awareness in the 70s. ?Highest sugar was 368 ... >> 168 >> 306 (party - no glipizide) >> 249. ? ?Glucometer: ReliOn ? ?Pt's meals are: ?- Breakfast: if not skipping - egg + whole wheat toast; cheerios + milk ?- Lunch: chicken salad, apple, veggies, trisquits  ?- Dinner: meat + 2 veggies +/- rolls ? ?In the past, she was on Otezla for autoimmune hepatitis.  She could not tolerate it and had to stop.  She lost 24 pounds on it. ? ?-+ CKD, last BUN/creatinine:  ?Lab Results  ?Component Value Date  ?  BUN 36 (H) 01/28/2021  ? BUN 26 (H) 08/28/2020  ? CREATININE 1.92 (H) 01/28/2021  ? CREATININE 1.67 (H) 08/28/2020  ?On Diovan 160. ? ?-+ HL; last set of lipids: ?Lab Results  ?Component Value Date  ? CHOL 83 (L) 08/20/2020  ? HDL 22 (L) 08/20/2020  ? [place] 21 08/20/2020  ? LDLDIRECT 93 11/15/2012  ? TRIG 270 (H) 08/20/2020  ? CHOLHDL 3.8 08/20/2020  ?On Crestor 20, fenofibrate, Vascepa.  Also on Praluent started 12/2018. ? ?- last eye exam was in 03/2021: No DR. Dr. Katy George.  ? ?- no numbness and tingling in her feet. ? ?Pt has FH of DM in M, B, M uncle, MGM. ? ?She was admitted for chest pain 10/2018 after an episode of dehydration and had heart catheterization at that time >> had another  stent and the previous stent was recanalized. ?She has a history of autoimmune hepatitis. Also, psoriasis, diagnosed as a child. ?She lost a signif. amount of weight on [employer] [place] in 2018.   ?She has a history of vertigo, improved on meclizine. ?She had Covid in 02/2021 >> could not eat and had GI issues for 3 weeks afterward. She had palpitations after Covid >>  improved. She saw Haley George.  ?She has a h/o yeast infections. ? ?ROS: ?+ see HPI ? ?I reviewed pt's medications, allergies, PMH, social hx, family hx, and changes were documented in the history of present illness. Otherwise, unchanged from my initial visit note. ? ?Past Medical History:  ?Diagnosis Date  ? Asthma   ? Cancer [facility])   ? basal cell chest  ? Complication of anesthesia   ? Coronary artery disease   ? Diabetes mellitus without complication ([place])   ? type 2  ? Heart murmur   ? Hepatitis   ? auto- immune  ? History of kidney stones   ? History of nuclear stress test 05/21/2010  ? dipyridamole; normal perfusion, preserved LV systolic EF of 67%  ? Hyperlipidemia   ? Hypertension   ? Liver failure ([place])   ? h/o autoimmune hepatitis   ? Myocardial infarction [facility])   ? Neuropathy   ? resolved with back surgery; tingling and burning left leg to foot  ? Obesity   ? PONV (postoperative nausea and vomiting)   ? Psoriasis   ? Tubular adenoma of colon   ? colon path 01/09/2017  ? ?Past Surgical History:  ?Procedure Laterality Date  ? Carotid Doppler  10/2008  ? R & L ICAs 0-49% diameter reduction   ? COLONOSCOPY    ? CORONARY ANGIOPLASTY WITH STENT PLACEMENT  09/2008  ? 2.25x92m Cypher DES to in-stent restenosis of prox LAD; Mini-Vision stent x2 2.0x168mto area distal of initial LAD stent; 3 2.5x1547maxus stents prox to Cypher stent in circumflex  ? CORONARY ANGIOPLASTY WITH STENT PLACEMENT  09/2005  ? Cypher DES 3.0x22m28m distal RCA; 2.25x12mm34mi0Vision stent to mid LAD; 2.5x18mm 22mer stent to circumflex  ? CORONARY STENT INTERVENTION N/A 11/13/2018   ? Procedure: CORONARY STENT INTERVENTION;  Surgeon: VaranaJettie Booze [facility];  Service: Cardiovascular;  Laterality: N/A;  ? CORONARY STENT INTERVENTION N/A 12/02/2019  ? Procedure: CORONARY STENT INTERVENTION;  Surgeon: End, CNelva Bush [facility];  Service: Cardiovascular;  Laterality: N/A;  ? LEFT HEART CATH AND CORONARY ANGIOGRAPHY N/A 11/13/2018  ? Procedure: LEFT HEART CATH AND CORONARY ANGIOGRAPHY;  Surgeon: VaranaJettie Booze [facility];  Service: Cardiovascular;  Laterality: N/A;  ? LEFT HEART CATH AND CORONARY ANGIOGRAPHY N/A 12/02/2019  ? Procedure: LEFT HEART CATH AND CORONARY ANGIOGRAPHY;  Surgeon: Nelva Bush, MD;  Location: [place] [facility];  Service: Cardiovascular;  Laterality: N/A;  ? LUMBAR LAMINECTOMY/DECOMPRESSION MICRODISCECTOMY Left 10/18/2016  ? Procedure: Left Lumbar Four-Five Microdiscectomy;  Surgeon: Erline Levine, MD;  Location: [place];  Service: Neurosurgery;  Laterality: Left;  Left L4-5 Microdiscectomy  ? ROTATOR CUFF REPAIR Left   ? SHOULDER ARTHROSCOPY W/ ROTATOR CUFF REPAIR Right   ? TRANSTHORACIC ECHOCARDIOGRAM  04/2010  ? EF=>55%, borderline conc LVH; trace MR & TR; AV mildly sclerotic; aortic root sclerosis/calcif  ? TRIGGER FINGER RELEASE Left 02/19/2020  ? Procedure: RELEASE TRIGGER FINGER/A-1 PULLEY;  Surgeon: Earlie Server, MD;  Location: [place];  Service: Orthopedics;  Laterality: Left;  ? ureteral stents    ? ?Social History  ? ?Socioeconomic History  ? Marital status: Single  ?  Spouse name: Not on file  ? Number of children: 1  ? Years of education: Not on file  ? Highest education level: Not on file  ?Occupational History  ?  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 Financial resource strain: Not on file  ? Food insecurity  ?  Worry: Not on file  ?  Inability: Not on file  ? Transportation needs  ?  Medical: Not on file  ?   Non-medical: Not on file  ?Tobacco Use  ? Smoking status: Never Smoker  ? Smokeless tobacco: Never Used  ?Substance and Sexual Activity  ? Alcohol use: No  ? Drug use: No  ? Sexual activity: Not Currently  ?Life

## 2021-08-30 ENCOUNTER — Encounter: Payer: Self-pay | Admitting: Family Medicine

## 2021-08-30 ENCOUNTER — Ambulatory Visit: Payer: BC Managed Care – PPO | Admitting: Family Medicine

## 2021-08-30 VITALS — BP 110/70 | HR 76 | Temp 97.5°F | Wt 180.6 lb

## 2021-08-30 DIAGNOSIS — E1169 Type 2 diabetes mellitus with other specified complication: Secondary | ICD-10-CM | POA: Diagnosis not present

## 2021-08-30 DIAGNOSIS — E785 Hyperlipidemia, unspecified: Secondary | ICD-10-CM

## 2021-08-30 DIAGNOSIS — E119 Type 2 diabetes mellitus without complications: Secondary | ICD-10-CM

## 2021-08-30 DIAGNOSIS — Z794 Long term (current) use of insulin: Secondary | ICD-10-CM

## 2021-08-30 DIAGNOSIS — D649 Anemia, unspecified: Secondary | ICD-10-CM | POA: Diagnosis not present

## 2021-08-30 DIAGNOSIS — I10 Essential (primary) hypertension: Secondary | ICD-10-CM

## 2021-08-30 NOTE — Progress Notes (Signed)
? ?  Subjective:  ? ? Patient ID: Haley George, female    DOB: [birth year], 66 y.o.   MRN: [id number] ? ?HPI ?She is here for consult concerning anemia.  She has a history of normocytic anemia with hemoglobin roughly 10.  She tried to give blood but was unable to do that.  She also needs blood work done for her diabetes, hyperlipidemia etc.  She does see Dr. Cruzita Lederer for that. ? ? ?Review of Systems ? ?   ?Objective:  ? Physical Exam ? ?Alert and in no distress otherwise not examined ?Apparently her last hemoglobin A1c was below 7. ? ?   ?Assessment & Plan:  ?Hyperlipidemia associated with type 2 diabetes mellitus ([place]) - Plan: Lipid panel ? ?Insulin-requiring or dependent type II diabetes mellitus ([place]) ? ?Essential hypertension - Plan: Comprehensive metabolic panel, Lipid panel ? ?Normocytic anemia - Plan: CBC with Differential/Platelet, Iron, TIBC and Ferritin Panel ?I will do routine blood screening and check her iron levels. ? ?

## 2021-08-31 LAB — CBC WITH DIFFERENTIAL/PLATELET
Basophils Absolute: 0 10*3/uL (ref 0.0–0.2)
Basos: 1 %
EOS (ABSOLUTE): 0.3 10*3/uL (ref 0.0–0.4)
Eos: 6 %
Hematocrit: 35.5 % (ref 34.0–46.6)
Hemoglobin: 11.5 g/dL (ref 11.1–15.9)
Immature Grans (Abs): 0 10*3/uL (ref 0.0–0.1)
Immature Granulocytes: 0 %
Lymphocytes Absolute: 1.3 10*3/uL (ref 0.7–3.1)
Lymphs: 24 %
MCH: 27.9 pg (ref 26.6–33.0)
MCHC: 32.4 g/dL (ref 31.5–35.7)
MCV: 86 fL (ref 79–97)
Monocytes Absolute: 0.4 10*3/uL (ref 0.1–0.9)
Monocytes: 8 %
Neutrophils Absolute: 3.3 10*3/uL (ref 1.4–7.0)
Neutrophils: 61 %
Platelets: 265 10*3/uL (ref 150–450)
RBC: 4.12 x10E6/uL (ref 3.77–5.28)
RDW: 13.3 % (ref 11.7–15.4)
WBC: 5.3 10*3/uL (ref 3.4–10.8)

## 2021-08-31 LAB — COMPREHENSIVE METABOLIC PANEL
ALT: 21 IU/L (ref 0–32)
AST: 28 IU/L (ref 0–40)
Albumin/Globulin Ratio: 1.5 (ref 1.2–2.2)
Albumin: 4.5 g/dL (ref 3.8–4.8)
Alkaline Phosphatase: 37 IU/L — ABNORMAL LOW (ref 44–121)
BUN/Creatinine Ratio: 16 (ref 12–28)
BUN: 22 mg/dL (ref 8–27)
Bilirubin Total: 0.3 mg/dL (ref 0.0–1.2)
CO2: 23 mmol/L (ref 20–29)
Calcium: 10 mg/dL (ref 8.7–10.3)
Chloride: 105 mmol/L (ref 96–106)
Creatinine, Ser: 1.41 mg/dL — ABNORMAL HIGH (ref 0.57–1.00)
Globulin, Total: 3.1 g/dL (ref 1.5–4.5)
Glucose: 119 mg/dL — ABNORMAL HIGH (ref 70–99)
Potassium: 4.8 mmol/L (ref 3.5–5.2)
Sodium: 141 mmol/L (ref 134–144)
Total Protein: 7.6 g/dL (ref 6.0–8.5)
eGFR: 42 mL/min/{1.73_m2} — ABNORMAL LOW (ref >59)

## 2021-08-31 LAB — IRON,TIBC AND FERRITIN PANEL
Ferritin: 17 ng/mL (ref 15–150)
Iron Saturation: 18 % (ref 15–55)
Iron: 78 ug/dL (ref 27–139)
Total Iron Binding Capacity: 435 ug/dL (ref 250–450)
UIBC: 357 ug/dL (ref 118–369)

## 2021-08-31 LAB — LIPID PANEL
Chol/HDL Ratio: 3.1 ratio (ref 0.0–4.4)
Cholesterol, Total: 78 mg/dL — ABNORMAL LOW (ref 100–199)
HDL: 25 mg/dL — ABNORMAL LOW (ref >39)
LDL Chol Calc (NIH): 15 mg/dL (ref 0–99)
Triglycerides: 258 mg/dL — ABNORMAL HIGH (ref 0–149)
VLDL Cholesterol Cal: 38 mg/dL (ref 5–40)

## 2021-09-15 ENCOUNTER — Other Ambulatory Visit: Payer: Self-pay | Admitting: Cardiovascular Disease

## 2021-09-15 ENCOUNTER — Other Ambulatory Visit: Payer: Self-pay | Admitting: Internal Medicine

## 2021-09-15 ENCOUNTER — Other Ambulatory Visit: Payer: Self-pay | Admitting: Family Medicine

## 2021-09-15 DIAGNOSIS — E1169 Type 2 diabetes mellitus with other specified complication: Secondary | ICD-10-CM

## 2021-09-28 ENCOUNTER — Ambulatory Visit: Payer: BC Managed Care – PPO | Admitting: Family Medicine

## 2021-09-28 ENCOUNTER — Telehealth: Payer: Self-pay

## 2021-09-28 ENCOUNTER — Encounter: Payer: Self-pay | Admitting: Family Medicine

## 2021-09-28 VITALS — BP 146/70 | HR 84 | Temp 96.8°F | Wt 180.4 lb

## 2021-09-28 DIAGNOSIS — N644 Mastodynia: Secondary | ICD-10-CM

## 2021-09-28 NOTE — Telephone Encounter (Signed)
Pt. Called stating that Dr. Sabra Heck is out of the country and will not be back until the end of the month. She wanted to know if you could refer her to a different doctor.

## 2021-09-28 NOTE — Progress Notes (Signed)
   Subjective:    Patient ID: Haley George, female    DOB: [birth year], 66 y.o.   MRN: [id number]  HPI She complains of a 4-day history of breast pain that started out mainly on the right now it is both sides.  No discharge from the breast, blurred vision, double vision, headache.  She is postmenopausal and no postmenopausal bleedingand has had a mammogram in November.   Review of Systems     Objective:   Physical Exam Alert and complaining of breast pain.  Exam of both breasts shows no masses but the right nipple is slightly tender to palpation.       Assessment & Plan:  Breast pain in female - Plan: Ambulatory referral to Obstetrics / Gynecology The bilateral nature of this makes me wonder about systemic issues however she has no CNS or other types of complaints to chase after. Recommend Tylenol and also judicious use of an NSAID due to her kidney function. She wants to see  Edwinna Areola

## 2021-09-29 ENCOUNTER — Other Ambulatory Visit: Payer: Self-pay

## 2021-09-29 DIAGNOSIS — N644 Mastodynia: Secondary | ICD-10-CM

## 2021-10-06 ENCOUNTER — Telehealth: Payer: Self-pay | Admitting: Family Medicine

## 2021-10-06 NOTE — Telephone Encounter (Signed)
She is waiting for referral appointments and states she is "crashing " in the afternoons and wants to come in to get full  Bloodwork up done to see what is going on

## 2021-10-07 NOTE — Telephone Encounter (Signed)
Pt. Called stating she was returning your call. I told her she just needed to schedule an apt and I offered her an apt next week with him. She said she was going to try to figure something else out she didn't feel like she needed another apt.

## 2021-10-07 NOTE — Telephone Encounter (Signed)
Vm for pt to call back and make an appointment.

## 2021-12-02 ENCOUNTER — Other Ambulatory Visit: Payer: Self-pay | Admitting: Internal Medicine

## 2021-12-02 ENCOUNTER — Other Ambulatory Visit: Payer: Self-pay | Admitting: Family Medicine

## 2021-12-02 ENCOUNTER — Other Ambulatory Visit: Payer: Self-pay | Admitting: Cardiovascular Disease

## 2021-12-02 DIAGNOSIS — E118 Type 2 diabetes mellitus with unspecified complications: Secondary | ICD-10-CM

## 2021-12-02 DIAGNOSIS — E1159 Type 2 diabetes mellitus with other circulatory complications: Secondary | ICD-10-CM

## 2021-12-02 DIAGNOSIS — E785 Hyperlipidemia, unspecified: Secondary | ICD-10-CM

## 2021-12-02 DIAGNOSIS — E1169 Type 2 diabetes mellitus with other specified complication: Secondary | ICD-10-CM

## 2021-12-03 ENCOUNTER — Telehealth: Payer: Self-pay | Admitting: Cardiovascular Disease

## 2021-12-03 MED ORDER — REPATHA SURECLICK 140 MG/ML ~~LOC~~ SOAJ
1.0000 | SUBCUTANEOUS | 11 refills | Status: DC
Start: 2021-12-03 — End: 2021-12-06

## 2021-12-03 NOTE — Telephone Encounter (Signed)
Returned call to patient who states that her insurance is no longer covering Praulent and instead is switching to repatha. Patient is due for another dose next week and would like to know if she needs to or can be switched. Advised I would forward message to PharmD for review and advice. Patient verbalized understanding.

## 2021-12-03 NOTE — Telephone Encounter (Addendum)
Repatha is available on pt's formulary without prior authorization. Rx sent to pharmacy.  Copay card activated and faxed info to pharmacy. BIN: [id number] PCN: [id number] GRP: [id number] ID: [id number]  Called pt and left message. She does not have MyChart.

## 2021-12-03 NOTE — Telephone Encounter (Signed)
Patient is calling requesting to speak with Haley George about medication changes needing to be made due to her insurance. Patient did not specify any further on which medications or the alternatives suggested. Stating she will discuss it when [place] calls. Please advise.

## 2021-12-06 MED ORDER — REPATHA SURECLICK 140 MG/ML ~~LOC~~ SOAJ: 1.0000 | SUBCUTANEOUS | 3 refills | Status: DC

## 2021-12-06 NOTE — Telephone Encounter (Signed)
Pt is aware of below message.

## 2021-12-13 ENCOUNTER — Encounter: Payer: Self-pay | Admitting: Family Medicine

## 2021-12-13 ENCOUNTER — Ambulatory Visit: Payer: BC Managed Care – PPO | Admitting: Family Medicine

## 2021-12-13 VITALS — BP 144/84 | HR 84 | Temp 98.6°F | Wt 183.4 lb

## 2021-12-13 DIAGNOSIS — J309 Allergic rhinitis, unspecified: Secondary | ICD-10-CM

## 2021-12-13 DIAGNOSIS — I350 Nonrheumatic aortic (valve) stenosis: Secondary | ICD-10-CM | POA: Diagnosis not present

## 2021-12-13 DIAGNOSIS — J029 Acute pharyngitis, unspecified: Secondary | ICD-10-CM

## 2021-12-13 LAB — POCT RAPID STREP A (OFFICE): Rapid Strep A Screen: NEGATIVE

## 2021-12-13 NOTE — Progress Notes (Signed)
   Subjective:    Patient ID: Haley George, female    DOB: Sep 15, 1955, 66 y.o.   MRN: [id number]  HPI She complains of a 1 day history of sore throat as well as right earache but no fever, chills, cough or congestion.  She does have underlying allergies and uses Zyrtec on an as-needed basis.   Review of Systems     Objective:   Physical Exam Alert and in no distress. Tympanic membranes and canals are normal. Pharyngeal area is normal. Neck is supple without adenopathy or thyromegaly. Cardiac exam shows a regular sinus rhythm 2/6 SEM,no gallops. Lungs are clear to auscultation. Strep test is negative       Assessment & Plan:  Sore throat - Plan: Rapid Strep A  Aortic valve stenosis, etiology of cardiac valve disease unspecified  Allergic rhinitis, unspecified seasonality, unspecified trigger Recommend supportive care and call if further difficulty.

## 2021-12-14 ENCOUNTER — Other Ambulatory Visit: Payer: Self-pay | Admitting: Internal Medicine

## 2021-12-14 ENCOUNTER — Encounter: Payer: Self-pay | Admitting: Internal Medicine

## 2021-12-14 ENCOUNTER — Ambulatory Visit: Payer: BC Managed Care – PPO | Admitting: Internal Medicine

## 2021-12-14 VITALS — BP 128/84 | HR 89 | Ht 64.0 in | Wt 182.8 lb

## 2021-12-14 DIAGNOSIS — Z794 Long term (current) use of insulin: Secondary | ICD-10-CM

## 2021-12-14 DIAGNOSIS — E785 Hyperlipidemia, unspecified: Secondary | ICD-10-CM

## 2021-12-14 DIAGNOSIS — E1169 Type 2 diabetes mellitus with other specified complication: Secondary | ICD-10-CM | POA: Diagnosis not present

## 2021-12-14 DIAGNOSIS — E6609 Other obesity due to excess calories: Secondary | ICD-10-CM

## 2021-12-14 DIAGNOSIS — E66811 Obesity, class 1: Secondary | ICD-10-CM

## 2021-12-14 DIAGNOSIS — Z6834 Body mass index (BMI) 34.0-34.9, adult: Secondary | ICD-10-CM

## 2021-12-14 DIAGNOSIS — E1159 Type 2 diabetes mellitus with other circulatory complications: Secondary | ICD-10-CM | POA: Diagnosis not present

## 2021-12-14 DIAGNOSIS — E118 Type 2 diabetes mellitus with unspecified complications: Secondary | ICD-10-CM

## 2021-12-14 LAB — POCT GLYCOSYLATED HEMOGLOBIN (HGB A1C): Hemoglobin A1C: 7.9 % — AB (ref 4.0–5.6)

## 2021-12-14 MED ORDER — DAPAGLIFLOZIN PROPANEDIOL 5 MG PO TABS: 5.0000 mg | ORAL_TABLET | Freq: Every day | ORAL | 3 refills | Status: DC

## 2021-12-14 NOTE — Patient Instructions (Addendum)
Please continue: - Metformin 1000 mg 2x a day with meals (- Glipizide 2.5-5 mg before a large meal only) - Levemir 50 units at bedtime - Ozempic 1 mg weekly  Please start: - Farxiga 5 mg before breakfast  Please come back for a follow-up appointment in 4 months.

## 2021-12-14 NOTE — Progress Notes (Signed)
Patient ID: Haley George, female   DOB: [birth year], 66 y.o.   MRN: [id number]   HPI: Haley George is a 66 y.o.-year-old female, initially referred by her PCP, Dr. Redmond School, returning for follow-up for DM2, dx initially in 1997 2/2 liver failure, then GDM, then re-occuring in 2007, insulin-dependent, uncontrolled, with long-term complications (CAD-s/p NSTEMI in 11/2019, s/p stents, aortic valve stenosis; CKD).  Last visit 4 mo ago.  Interim history: No blurry vision. She has some nausea the next day after taking Ozempic. She has been off Ozempic for 5 weeks due to shortage at the pharmacy. She has frequent UTIs-drinking cranberry juice. She needs aortic valve surgery but would like to delay this until she gets Medicare. She is not sleeping well.  She is also still grieving for her husband who died 2 years ago.  Reviewed HbA1c levels: Lab Results  Component Value Date   HGBA1C 6.9 (A) 08/12/2021   HGBA1C 7.0 (A) 01/28/2021   HGBA1C 6.3 (A) 12/08/2020   HGBA1C 7.4 (H) 08/20/2020   HGBA1C 7.0 (A) 07/28/2020   HGBA1C 6.2 (A) 01/28/2020   HGBA1C 6.6 (H) 12/02/2019   HGBA1C 6.4 (H) 10/22/2019   HGBA1C 6.3 (A) 01/29/2019   HGBA1C 9.2 (A) 08/30/2018  05/01/2019: HbA1c 6.2% She was on steroid injections in 2019.  Pt is on a regimen of: - Metformin 1000 mg 2x a day, with meals - Glipizide 2.5-5 mg before a meal - added 01/2019 - before a large meals  - Levemir 140 (70 units x2) units at bedtime >> 70 units >> ...45 >> 50 units at bedtime   - Ozempic 0.5 mg weekly-added 09/2018 >> 1 mg weekly >> 0.75 mg weekly due to gastric discomfort/nausea >> 1 mg weekly She was on Actos before.  Pt checks her sugars seldom: - am: 131-162, 180 >> 98-125 >> 94-126 >> 89, 102-131 >> 110-130 - 2h after b'fast: 140, 168 >> 128-131 >> 161, 249 (eating out) >> n/c - before lunch: 160, 183 >> 139, 146 >> n/c >> 129-190 >> n/c - 2h after lunch: 190 >> n/c >> 141-169 >> 133-209 >> n/c - before dinner: 142-173 >>  131-162 >> 132 >> 131-169 >> n/c - 2h after dinner: 129-168 >>139-170, 248 (ate out) >> 153-202 >> n/c - bedtime: 146-192 >> n/c >> 141-189 >> 140-171 >> <200 - nighttime: n/c Lowest sugar was 40 - 2012 >> ...  89 >> 90s; she has hypoglycemia awareness in the 70s. Highest sugar was 368 ... >> 306 (party - no glipizide) >> 249 >> 200s.  Glucometer: ReliOn  Pt's meals are: - Breakfast: if not skipping - egg + whole wheat toast; cheerios + milk - Lunch: chicken salad, apple, veggies, trisquits  - Dinner: meat + 2 veggies +/- rolls  In the past, she was on [place] for autoimmune hepatitis.  She could not tolerate it and had to stop.  She lost 24 pounds on it.  -+ CKD, last BUN/creatinine:  Lab Results  Component Value Date   BUN 22 08/30/2021   BUN 36 (H) 01/28/2021   CREATININE 1.41 (H) 08/30/2021   CREATININE 1.92 (H) 01/28/2021  On Diovan 160.  -+ HL; last set of lipids: Lab Results  Component Value Date   CHOL 78 (L) 08/30/2021   HDL 25 (L) 08/30/2021   LDLCALC 15 08/30/2021   LDLDIRECT 93 11/15/2012   TRIG 258 (H) 08/30/2021   CHOLHDL 3.1 08/30/2021  On Crestor 20, fenofibrate, Vascepa.  Also on Repatha.  -  last eye exam was in 03/2021: No DR. Dr. Katy Fitch.   - no numbness and tingling in her feet.  Last foot exam 08/2021.  Pt has FH of DM in M, B, M uncle, MGM.  She was admitted for chest pain 10/2018 after an episode of dehydration and had heart catheterization at that time >> had another stent and the previous stent was recanalized. She has a history of autoimmune hepatitis. Also, psoriasis, diagnosed as a child. She lost a signif. amount of weight on [place] in 2018.   She has a history of vertigo, improved on meclizine. She had Covid in 02/2021 >> could not eat and had GI issues for 3 weeks afterward. She had palpitations after Covid >>  improved. She saw Dr. Sallyanne Kuster.  She has a h/o yeast infections.  ROS: + see HPI  I reviewed pt's medications, allergies,  PMH, social hx, family hx, and changes were documented in the history of present illness. Otherwise, unchanged from my initial visit note.  Past Medical History:  Diagnosis Date   Asthma    Cancer ([place])    basal cell chest   Complication of anesthesia    Coronary artery disease    Diabetes mellitus without complication ([place])    type 2   Heart murmur    Hepatitis    auto- immune   History of kidney stones    History of nuclear stress test 05/21/2010   dipyridamole; normal perfusion, preserved LV systolic EF of 16%   Hyperlipidemia    Hypertension    Liver failure ([place])    h/o autoimmune hepatitis    Myocardial infarction ([place])    Neuropathy    resolved with back surgery; tingling and burning left leg to foot   Obesity    PONV (postoperative nausea and vomiting)    Psoriasis    Tubular adenoma of colon    colon path 01/09/2017   Past Surgical History:  Procedure Laterality Date   Carotid Doppler  10/2008   R & L ICAs 0-49% diameter reduction    COLONOSCOPY     CORONARY ANGIOPLASTY WITH STENT PLACEMENT  09/2008   2.25x31m Cypher DES to in-stent restenosis of prox LAD; Mini-Vision stent x2 2.0x156mto area distal of initial LAD stent; 3 2.5x1555maxus stents prox to Cypher stent in circumflex   CORONARY ANGIOPLASTY WITH STENT PLACEMENT  09/2005   Cypher DES 3.0x22m60m distal RCA; 2.25x12mm99mi0Vision stent to mid LAD; 2.5x18mm 28mer stent to circumflex   CORONARY STENT INTERVENTION N/A 11/13/2018   Procedure: CORONARY STENT INTERVENTION;  Surgeon: VaranaJettie Booze [facility];  Service: Cardiovascular;  Laterality: N/A;   CORONARY STENT INTERVENTION N/A 12/02/2019   Procedure: CORONARY STENT INTERVENTION;  Surgeon: End, CNelva Bush [facility];  Service: Cardiovascular;  Laterality: N/A;   LEFT HEART CATH AND CORONARY ANGIOGRAPHY N/A 11/13/2018   Procedure: LEFT HEART CATH AND CORONARY ANGIOGRAPHY;  Surgeon: VaranaJettie Booze  [facility];  Service: Cardiovascular;  Laterality: N/A;   LEFT HEART CATH AND CORONARY ANGIOGRAPHY N/A 12/02/2019   Procedure: LEFT HEART CATH AND CORONARY ANGIOGRAPHY;  Surgeon: End, CNelva Bush [facility];  Service: Cardiovascular;  Laterality: N/A;   LUMBAR LAMINECTOMY/DECOMPRESSION MICRODISCECTOMY Left 10/18/2016   Procedure: Left Lumbar Four-Five Microdiscectomy;  Surgeon: Stern,Erline Levine Location: [facility];[place]: Neurosurgery;  Laterality: Left;  Left L4-5 Microdiscectomy   ROTATOR CUFF REPAIR  Left    SHOULDER ARTHROSCOPY W/ ROTATOR CUFF REPAIR Right    TRANSTHORACIC ECHOCARDIOGRAM  04/2010   EF=>55%, borderline conc LVH; trace MR & TR; AV mildly sclerotic; aortic root sclerosis/calcif   TRIGGER FINGER RELEASE Left 02/19/2020   Procedure: RELEASE TRIGGER FINGER/A-1 PULLEY;  Surgeon: Earlie Server, MD;  Location: [facility];  Service: Orthopedics;  Laterality: Left;   ureteral stents     Social History   Socioeconomic History   Marital status: Single    Spouse name: Not on file   Number of children: 1   Years of education: Not on file   Highest education level: Not on file  Occupational History    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  Social Needs   Financial resource strain: Not on file   Food insecurity    Worry: Not on file    Inability: Not on file   Transportation needs    Medical: Not on file    Non-medical: Not on file  Tobacco Use   Smoking status: Never Smoker   Smokeless tobacco: Never Used  Substance and Sexual Activity   Alcohol use: No   Drug use: No   Sexual activity: Not Currently  Lifestyle   Physical activity    Days per week: Not on file    Minutes per session: Not on file   Stress: Not on file  Relationships   Social connections    Talks on phone: Not on file    Gets together: Not on file    Attends religious service: Not on file    Active member of club or  organization: Not on file    Attends meetings of clubs or organizations: Not on file    Relationship status: Not on file   Intimate partner violence    Fear of current or ex partner: Not on file    Emotionally abused: Not on file    Physically abused: Not on file    Forced sexual activity: Not on file  Other Topics Concern   Not on file  Social History Narrative   Not on file   Current Outpatient Medications on File Prior to Visit  Medication Sig Dispense Refill   aspirin EC 81 MG tablet Take 1 tablet (81 mg total) by mouth daily.     BD PEN NEEDLE NANO 2ND GEN 32G X 4 MM MISC USE AS DIRECTED FOR INSULIN ADMINISTRATION 100 each 3   bisoprolol (ZEBETA) 10 MG tablet TAKE 1 TABLET BY MOUTH EVERY DAY 90 tablet 1   bisoprolol (ZEBETA) 5 MG tablet TAKE 5 MG IN THE MORNING AND 10 MG (TWO TABLETS) IN THE EVENING. 270 tablet 2   Blood Glucose Monitoring Suppl (ONETOUCH VERIO) w/Device KIT Use to check blood sugar 2 times a day (Patient taking differently: 1 each by Other route See admin instructions. Use to check blood sugar 2 times a day) 1 kit 0   BREO ELLIPTA 200-25 MCG/INH AEPB Inhale 1 puff into the lungs at bedtime. 3 each 3   calcipotriene (DOVONOX) 0.005 % ointment Apply topically daily in the afternoon. (Patient not taking: Reported on 08/30/2021)     clopidogrel (PLAVIX) 75 MG tablet TAKE 1 TABLET BY MOUTH EVERY DAY WITH BREAKFAST 90 tablet 2   Coenzyme Q10 300 MG CAPS Take 300 mg by mouth daily.     Cranberry 300 MG tablet Take 300 mg by mouth every evening.     Evolocumab (REPATHA SURECLICK) 169 MG/ML SOAJ Inject  1 Pen into the skin every 14 (fourteen) days. 6 mL 3   fenofibrate (TRICOR) 145 MG tablet TAKE 1 TABLET (145 MG TOTAL) BY MOUTH EVERY EVENING 90 tablet 1   glipiZIDE (GLUCOTROL) 5 MG tablet Take 0.5-1 tablets (2.5-5 mg total) by mouth daily before supper. (Patient not taking: Reported on 12/13/2021) 90 tablet 3   Glucosamine-Chondroit-Vit C-Mn (GLUCOSAMINE CHONDR 1500 COMPLX PO)  Take 1 tablet by mouth daily.     glucose blood (ONETOUCH VERIO) test strip Use to check blood sugar 2 times a day 200 each 12   insulin detemir (LEVEMIR FLEXTOUCH) 100 UNIT/ML FlexPen Inject 50 Units into the skin at bedtime. 45 mL 3   isosorbide mononitrate (IMDUR) 60 MG 24 hr tablet Take 1 tablet (60 mg total) by mouth daily. ** DO NOT CRUSH ** 90 tablet 3   meclizine (ANTIVERT) 25 MG tablet Take 1 tablet (25 mg total) by mouth 3 (three) times daily as needed for dizziness. (Patient not taking: Reported on 08/30/2021) 30 tablet 1   metFORMIN (GLUCOPHAGE) 1000 MG tablet TAKE 1 TABLET (1,000 MG TOTAL) BY MOUTH 2 (TWO) TIMES DAILY WITH A MEAL. 180 tablet 0   Multiple Vitamins-Minerals (CENTRUM SILVER 50+WOMEN PO) Take 1 tablet by mouth daily.     nitroGLYCERIN (NITROSTAT) 0.4 MG SL tablet PLACE 1 TABLET (0.4 MG TOTAL) UNDER THE TONGUE EVERY 5 (FIVE) MINUTES AS NEEDED FOR CHEST PAIN (UP TO 3 DOSES). (Patient not taking: Reported on 12/13/2021) 75 tablet 1   nystatin cream (MYCOSTATIN) Apply topically as needed. (Patient not taking: Reported on 12/13/2021)     OZEMPIC, 1 MG/DOSE, 4 MG/3ML SOPN INJECT 1 MG INTO THE SKIN ONCE A WEEK. 9 mL 3   ReliOn Lancets Thin 26G MISC 1 Units by Does not apply route 2 (two) times daily. E 11.9 (Patient not taking: Reported on 08/30/2021) 100 each 12   rosuvastatin (CRESTOR) 20 MG tablet TAKE 1 TABLET BY MOUTH EVERY DAY 90 tablet 3   valsartan (DIOVAN) 160 MG tablet TAKE 1 TABLET BY MOUTH EVERY DAY 90 tablet 0   VASCEPA 1 g capsule TAKE 2 CAPSULES BY MOUTH 2 TIMES DAILY. 360 capsule 1   No current facility-administered medications on file prior to visit.   Allergies  Allergen Reactions   Penicillins Anaphylaxis and Hives     PATIENT HAD A PCN REACTION WITH IMMEDIATE RASH, FACIAL/TONGUE/THROAT SWELLING, SOB, OR LIGHTHEADEDNESS WITH HYPOTENSION:  #  #  #  YES  #  #  #   Has patient had a PCN reaction causing severe rash involving mucus membranes or skin  necrosis:Unknown Has patient had a PCN reaction that required hospitalization:No Has patient had a PCN reaction occurring within the last 10 years:No If all of the above answers are "NO", then may proceed with Cephalosporin use.    Coconut Flavor Swelling    Mouth breaks out    Cocos Nucifera     Breaks out mouth   Other Swelling    Walnuts cause mouth to break out   Family History  Problem Relation Age of Onset   CAD Mother    CAD Father    Diabetes Brother    PE: BP 128/84 (BP Location: Left Arm, Patient Position: Sitting, Cuff Size: Normal)   Pulse 89   Ht '5\' 4"'  (1.626 m)   Wt 182 lb 12.8 oz (82.9 kg)   SpO2 98%   BMI 31.38 kg/m  Wt Readings from Last 3 Encounters:  12/14/21 182 lb 12.8  oz (82.9 kg)  12/13/21 183 lb 6.4 oz (83.2 kg)  09/28/21 180 lb 6.4 oz (81.8 kg)   Constitutional: overweight, in NAD Eyes: EOMI, no exophthalmos ENT: moist mucous membranes, no thyromegaly, no cervical lymphadenopathy Cardiovascular: RRR, No RG, +2/6 SEM Respiratory: CTA B Musculoskeletal: no deformities Skin: moist, warm, no rashes Neurological: no tremor with outstretched hands  ASSESSMENT: 1. DM2, insulin-dependent, uncontrolled, with long-term complications - CAD-s/p AMI, s/p stents, aortic valve stenosis - CKD  2. HL  3.  Obesity class I  PLAN:  1. Patient with longstanding, uncontrolled, type 2 diabetes, on oral antidiabetic regimen with metformin and sulfonylurea, and also long-acting insulin and weekly GLP-1 receptor agonist, with fair control.  At last visit HbA1c improved to 6.9%.  At that time, sugars are slightly higher in the previous 2 weeks due to [place] and being out of town.  She forgot to take her glipizide with her.  She was telling me that she actually took this very rarely.  I advised her to take it before larger meals.  However, we also discussed about adding an SGLT2 inhibitor and I advised her to discuss with her cardiologist whether this is an option for  her, since she does have symptomatic aortic stenosis.  She does have a history of yeast infections and also remembered that her husband was on [place] and he had yeast infections on it.  We discussed that this is very subjective. -At today's visit, she tells me that she still quite affected by her husband's death and is not doing well at checking blood sugars, having regular meals, and sleeping.  Whenever she checked, sugars were slightly higher, and at today's visit, HbA1c is also higher (see below).  She has been off Ozempic for 5 weeks intermittently due to availability at the pharmacy.  Otherwise, she tries to take her medicines regularly. -We discussed about the need to restart checking her blood sugars, and, the need for regular meals.  However, I also recommended to see a counselor.  She is open to this idea.  She is also planning to start cardiac rehab.  This will also help her blood sugars. -For now, we discussed that her cardiologist was not opposed to starting an SGLT2 inhibitor.  We will try to start [place].  For now, we will continue the rest of the regimen. - I suggested to:  Patient Instructions  Please continue: - Metformin 1000 mg 2x a day with meals - Glipizide 2.5-5 mg before a large meal - Levemir 50 units at bedtime - Ozempic 1 mg weekly  Please start: - Farxiga 5 mg before breakfast  Please come back for a follow-up appointment in 4 months.  - we checked her HbA1c: 7.9% (higher) - advised to check sugars at different times of the day - 1x a day, rotating check times - advised for yearly eye exams >> she is UTD - return to clinic in 4 months  2. HL -Reviewed latest lipid panel from 08/2021: LDL at goal, triglycerides high, HDL low: Lab Results  Component Value Date   CHOL 78 (L) 08/30/2021   HDL 25 (L) 08/30/2021   LDLCALC 15 08/30/2021   LDLDIRECT 93 11/15/2012   TRIG 258 (H) 08/30/2021   CHOLHDL 3.1 08/30/2021  -She continues on Crestor 20 mg daily,  fenofibrate 145 mg daily, Vascepa 2 g twice a day, Repatha.  No side effects.  3.  Obesity class I -We will continue her GLP-1 receptor agonist (Ozempic) which should also help  with weight loss -At this visit, I also suggested an SGLT2 inhibitor, which should further help with weight loss -at last visit, she gained 3 pounds -At this visit, she gained 2 pounds  [name], MD PhD [facility]

## 2022-01-07 ENCOUNTER — Other Ambulatory Visit: Payer: Self-pay | Admitting: Internal Medicine

## 2022-01-07 DIAGNOSIS — E118 Type 2 diabetes mellitus with unspecified complications: Secondary | ICD-10-CM

## 2022-01-12 ENCOUNTER — Other Ambulatory Visit: Payer: Self-pay | Admitting: Internal Medicine

## 2022-01-12 DIAGNOSIS — E118 Type 2 diabetes mellitus with unspecified complications: Secondary | ICD-10-CM

## 2022-02-10 ENCOUNTER — Other Ambulatory Visit: Payer: Self-pay | Admitting: Family Medicine

## 2022-02-10 DIAGNOSIS — I152 Hypertension secondary to endocrine disorders: Secondary | ICD-10-CM

## 2022-02-10 DIAGNOSIS — E1169 Type 2 diabetes mellitus with other specified complication: Secondary | ICD-10-CM

## 2022-02-18 ENCOUNTER — Telehealth: Payer: Self-pay | Admitting: Cardiovascular Disease

## 2022-02-18 DIAGNOSIS — I25118 Atherosclerotic heart disease of native coronary artery with other forms of angina pectoris: Secondary | ICD-10-CM

## 2022-02-18 DIAGNOSIS — I350 Nonrheumatic aortic (valve) stenosis: Secondary | ICD-10-CM

## 2022-02-18 DIAGNOSIS — E785 Hyperlipidemia, unspecified: Secondary | ICD-10-CM

## 2022-02-18 NOTE — Telephone Encounter (Signed)
Pt c/o medication issue:  1. Name of Medication:   Evolocumab (REPATHA SURECLICK) 840 MG/ML SOAJ  2. How are you currently taking this medication (dosage and times per day)?   As prescribed  3. Are you having a reaction (difficulty breathing--STAT)?   No  4. What is your medication issue?   Patient stated she was told she is no longer authorized to receive this medication but she had received an authorization letter previously.  Patient stated she has not taken this medication for the last four weeks.

## 2022-02-21 IMAGING — US US RENAL
1 series · 14 of 25 positions shown · non-contrast
Comparison: Abdominal ultrasound April 22, 2019

CLINICAL DATA: Renal failure

EXAM:
RENAL / URINARY TRACT ULTRASOUND COMPLETE

[Series 1: us renal · 14 of 35 slices shown]
[im 1/35]
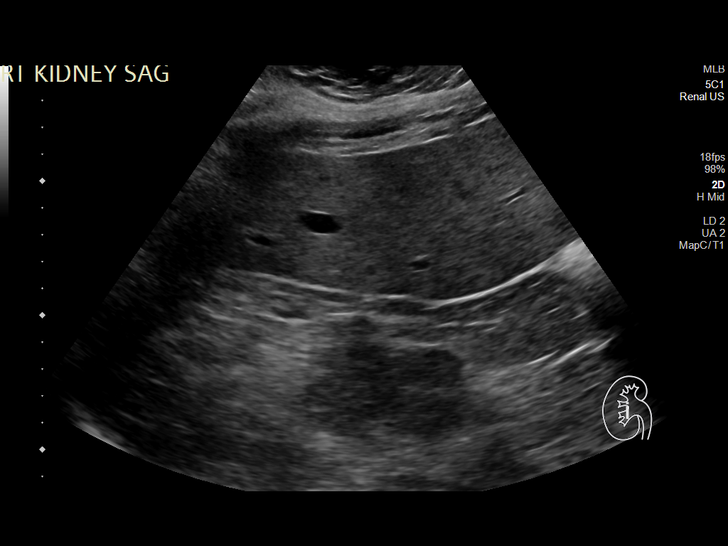
[im 3/35]
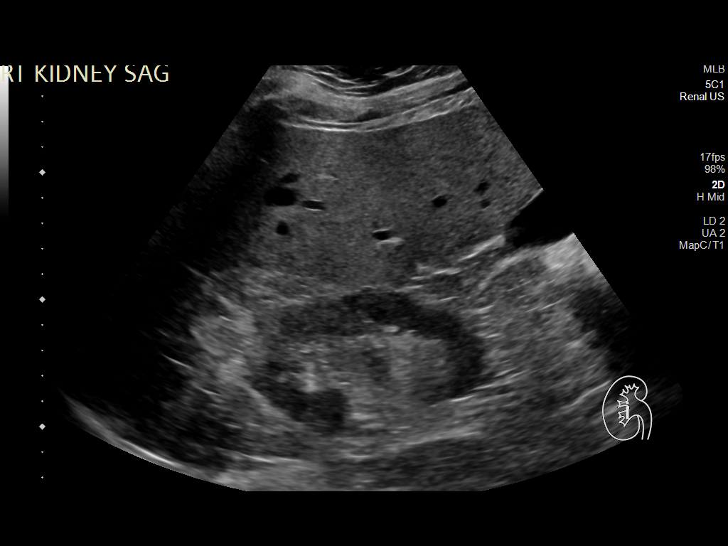
[im 6/35]
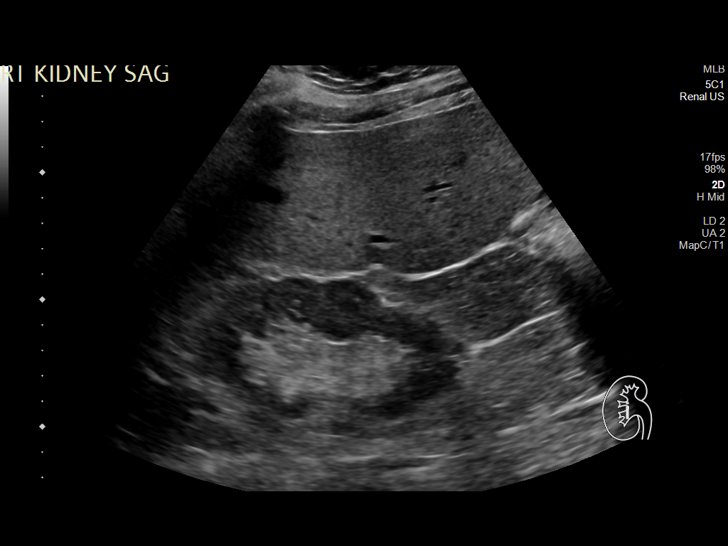
[im 9/35]
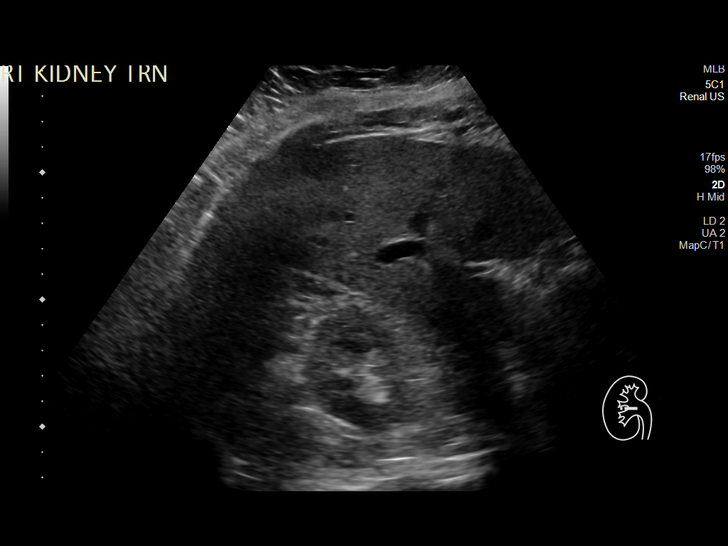
[im 12/35]
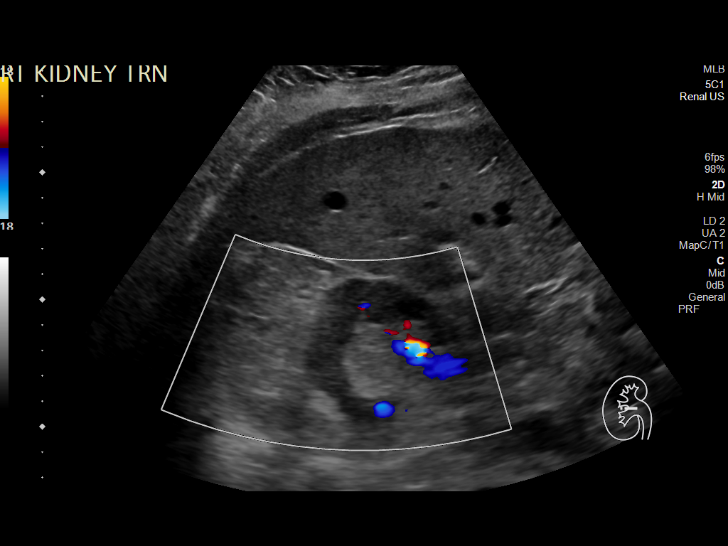
[im 13/35]
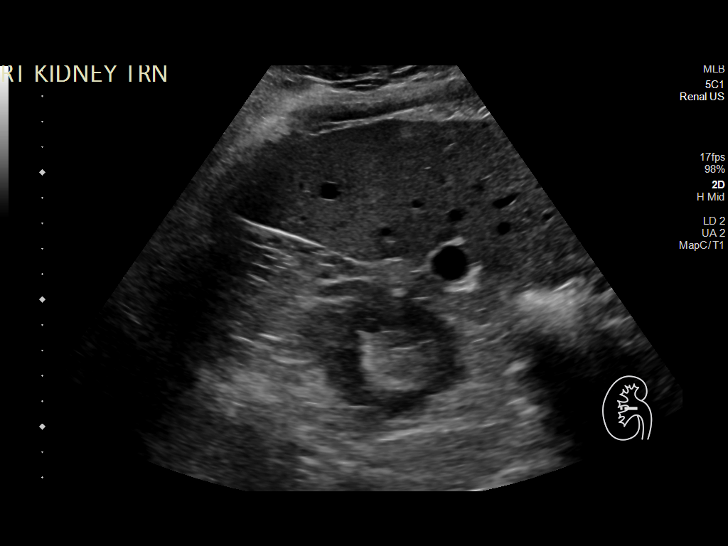
[im 16/35]
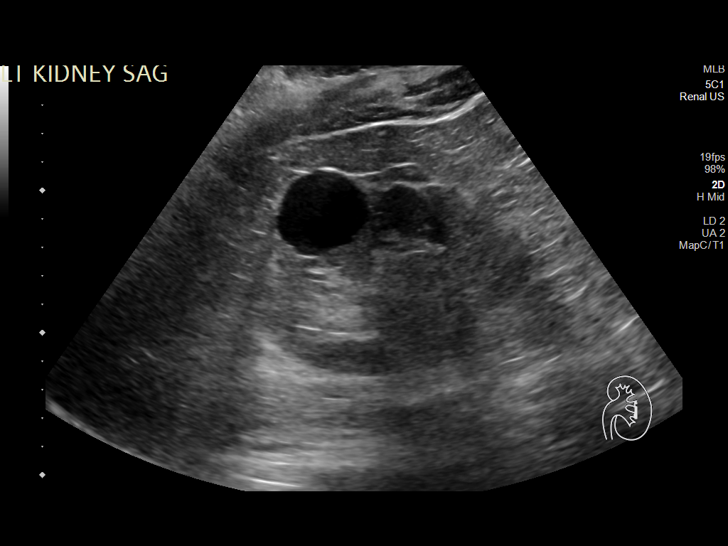
[im 19/35]
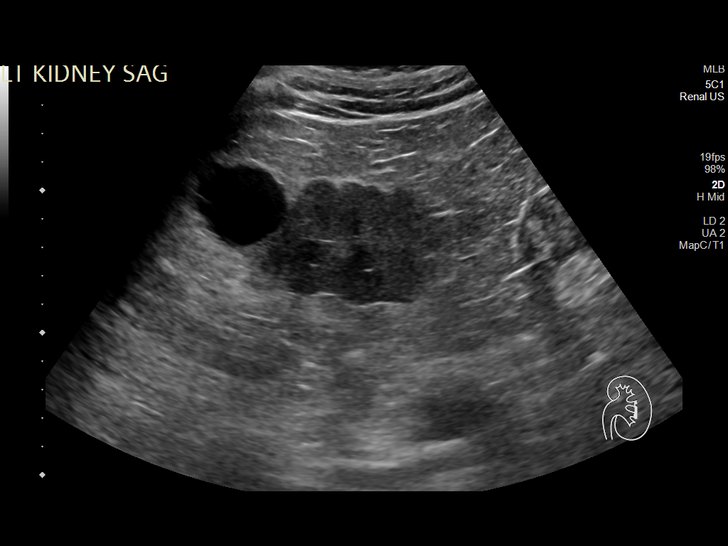
[im 22/35]
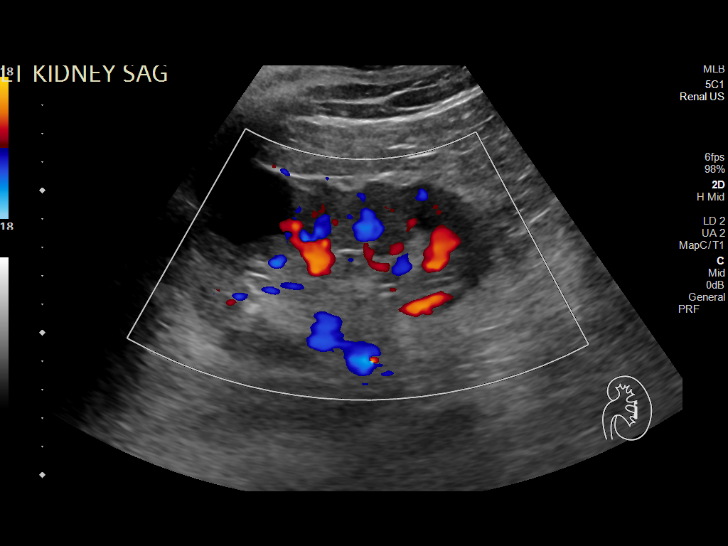
[im 23/35]
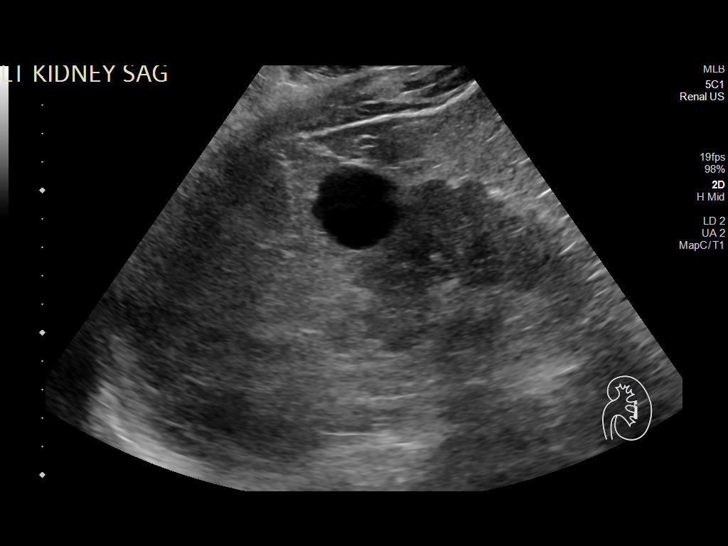
[im 26/35]
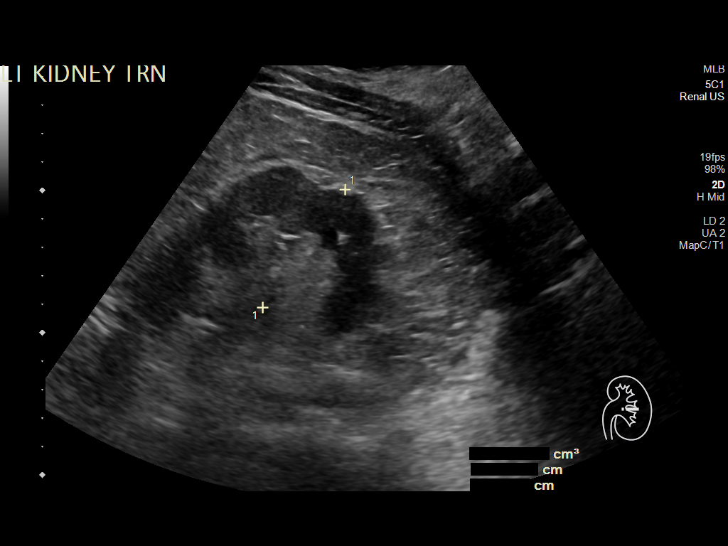
[im 29/35]
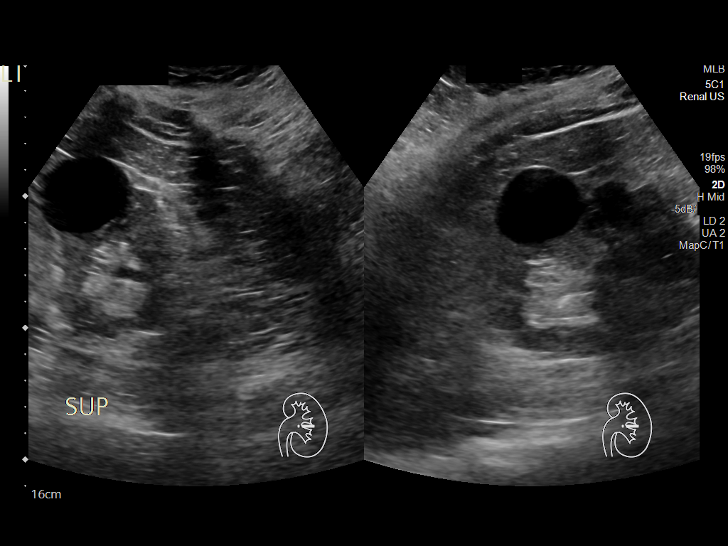
[im 32/35]
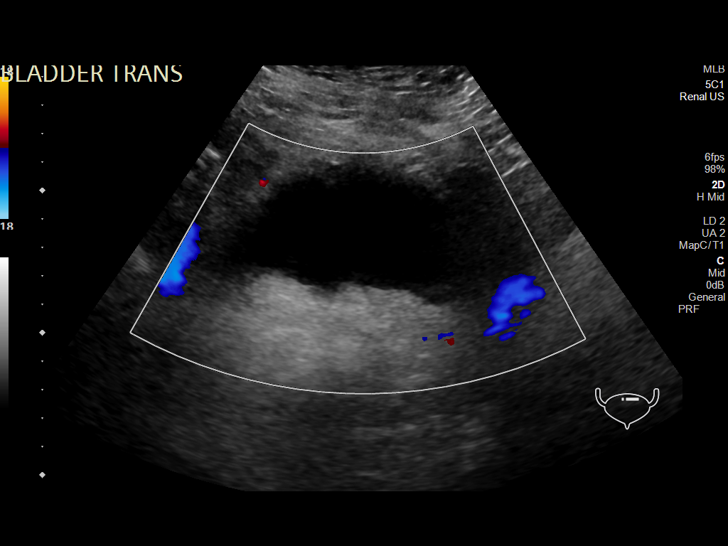
[im 35/35]
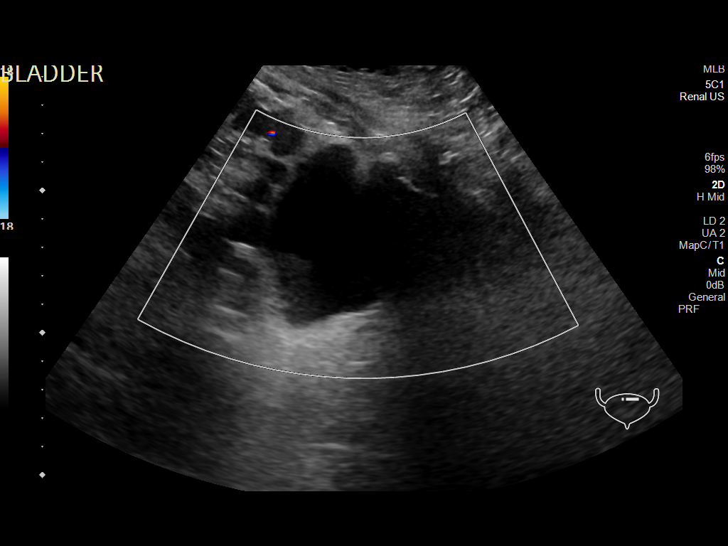

[14 of 25 positions shown; findings below may reference images not displayed]

FINDINGS: Right Kidney:

Renal measurements: 11.0 x 6.2 x 5.4 cm = volume: 191.7 mL.
Echogenicity and renal cortical thickness are within normal limits.
No mass, perinephric fluid, or hydronephrosis visualized. Fetal
lobulations present, an anatomic variant. No sonographically
demonstrable calculus or ureterectasis.

Left Kidney:

Renal measurements: 11.7 x 5.8 x 5.1 cm = volume: 181.3 mL.
Echogenicity and renal cortical thickness are within normal limits.
No perinephric fluid or hydronephrosis visualized. There is a cyst
arising from the upper pole left kidney measuring 3.5 x 2.9 x
cm. There are fetal lobulations, an anatomic variant. No
sonographically demonstrable calculus or ureterectasis.

Bladder:

Appears normal for degree of bladder distention.

Other:

None.
IMPRESSION: Cyst arising from upper pole left kidney measuring 3.5 x 2.9 x
cm. Study otherwise unremarkable.

## 2022-02-21 IMAGING — CT CT HEAD W/O CM
4 series · 17 of 47 positions shown, 19 images · non-contrast
Comparison: None.

CLINICAL DATA: Dizziness and vomiting

EXAM:
CT HEAD WITHOUT CONTRAST
TECHNIQUE: Contiguous axial images were obtained from the base of the skull
through the vertex without intravenous contrast.

[Series 2: head bone · axial · 0.40mm/px · z∈[-140,-90]mm · 4 of 74 slices shown]
[im 8/74  bone]
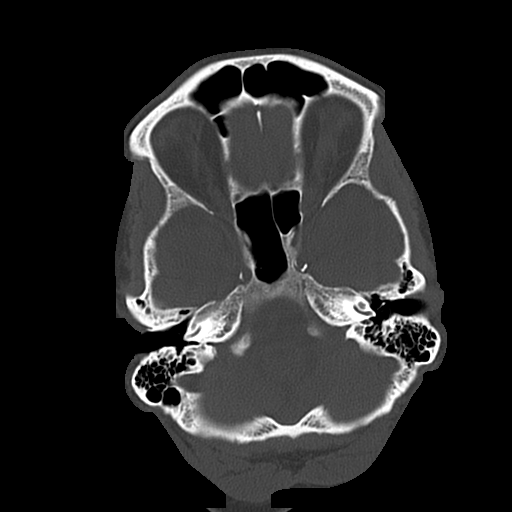
[im 15/74  bone]
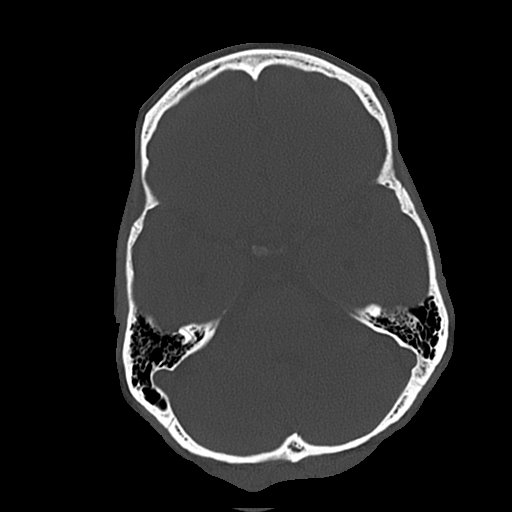
[im 22/74  bone]
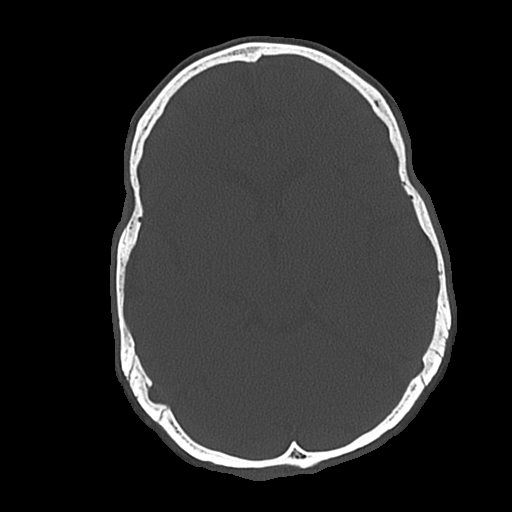
[im 33/74  bone]
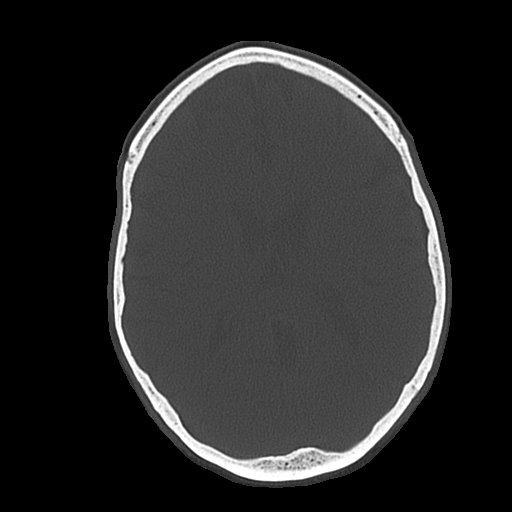

[Series 3: head wo · axial · 0.40mm/px · z∈[-139,-29]mm · 7 of 30 slices shown, 9 images]
[im 4/30  brain]
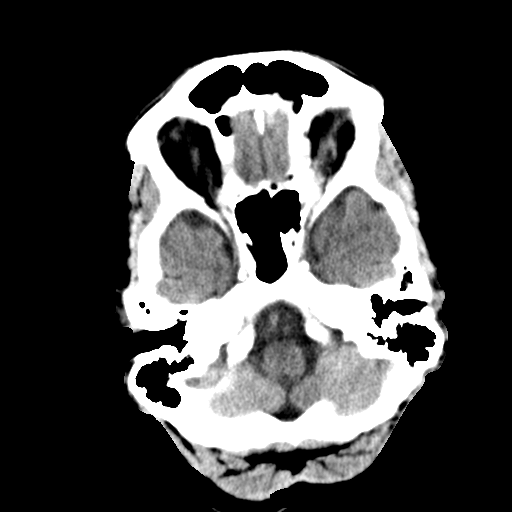
[im 4/30  bone]
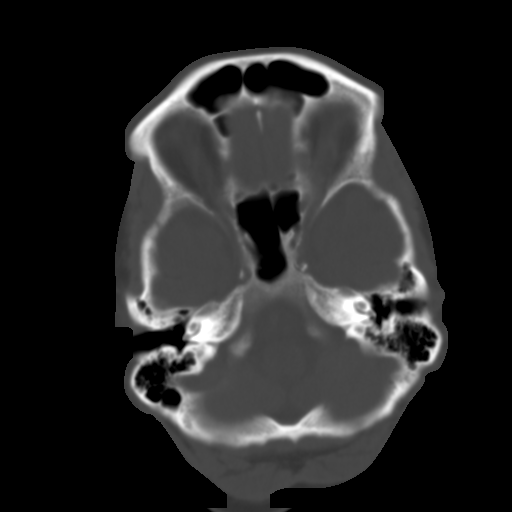
[im 8/30  brain]
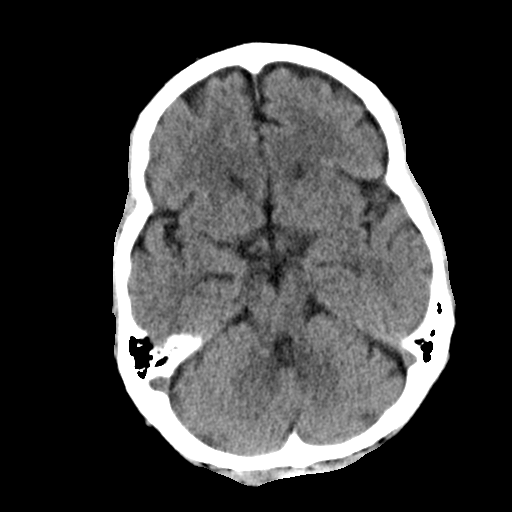
[im 11/30  brain]
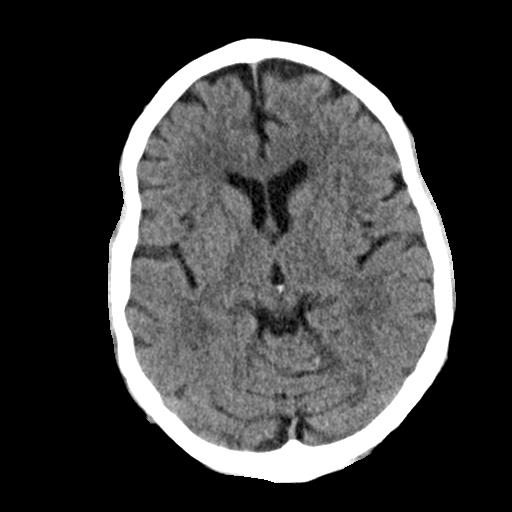
[im 15/30  brain]
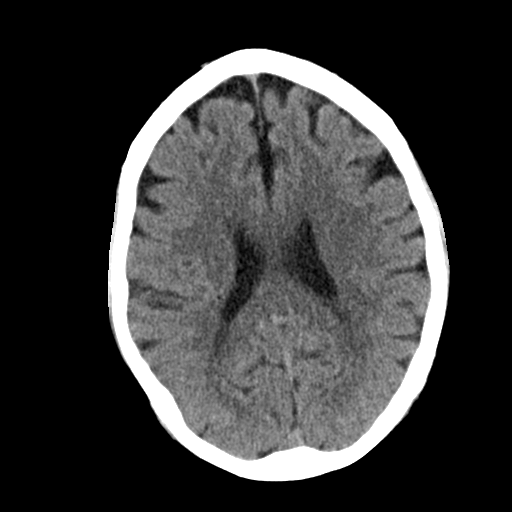
[im 19/30  brain]
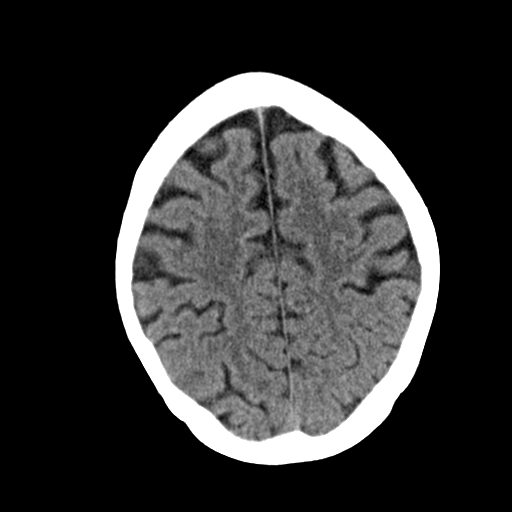
[im 19/30  bone]
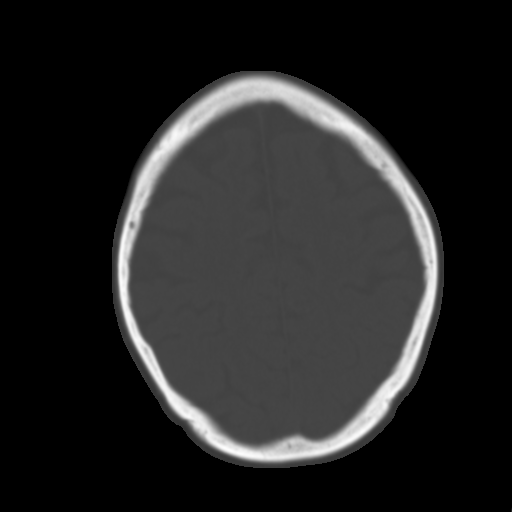
[im 22/30  brain]
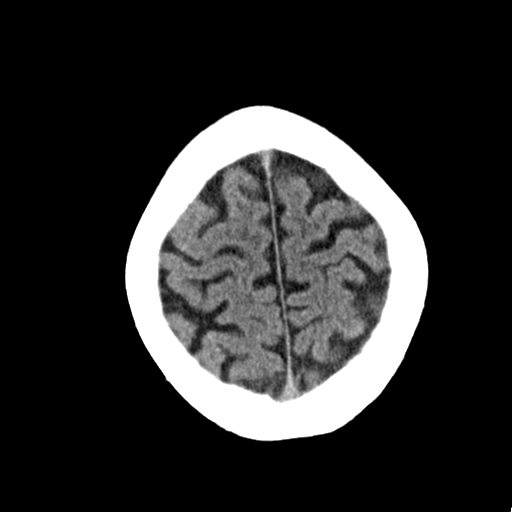
[im 26/30  brain]
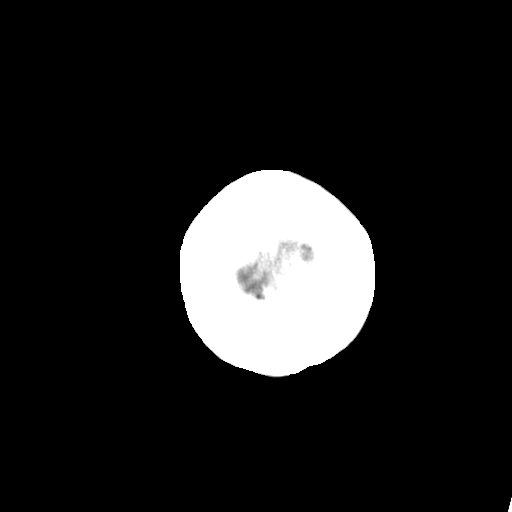

[Series 4: coronal soft · coronal · 0.29mm/px · 3 of 61 slices shown]
[im 21/61  brain]
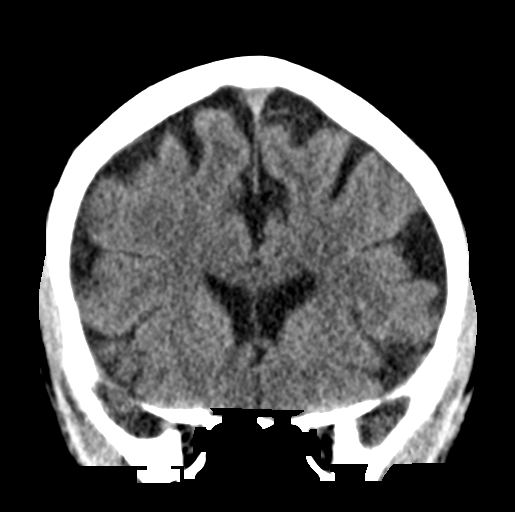
[im 27/61  brain]
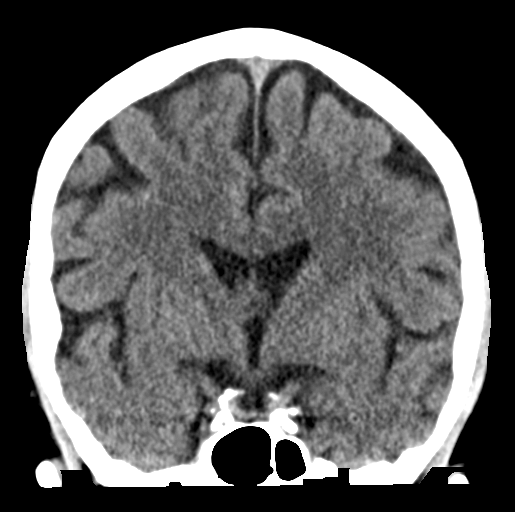
[im 34/61  brain]
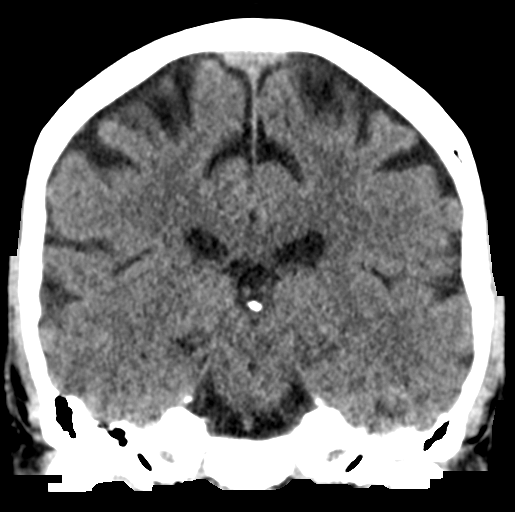

[Series 5: sagittal soft · sagittal · 0.29mm/px · 3 of 50 slices shown]
[im 17/50  brain]
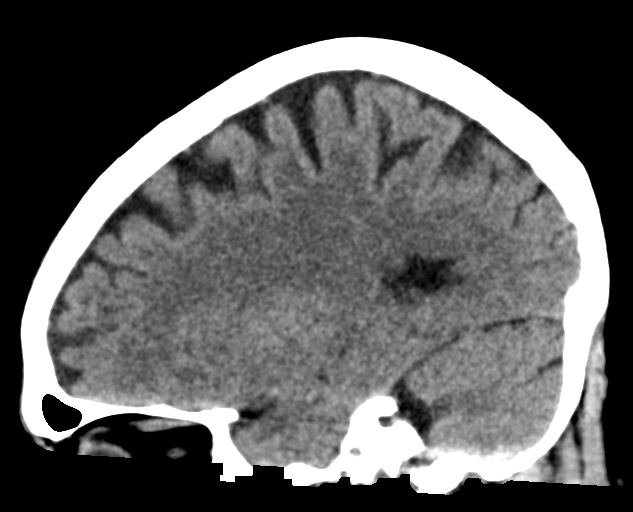
[im 25/50  brain]
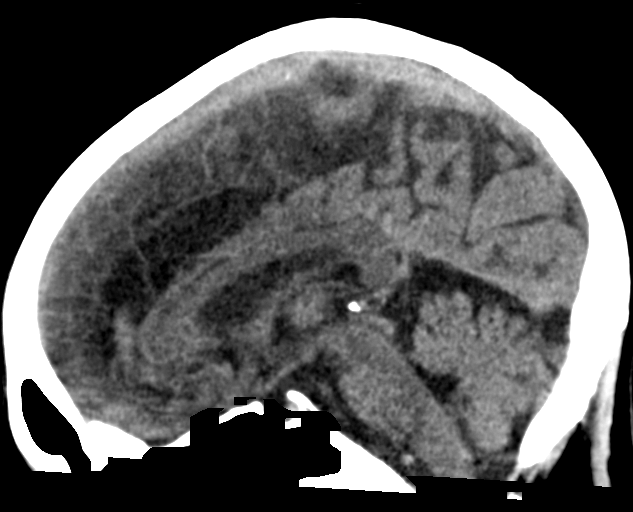
[im 33/50  brain]
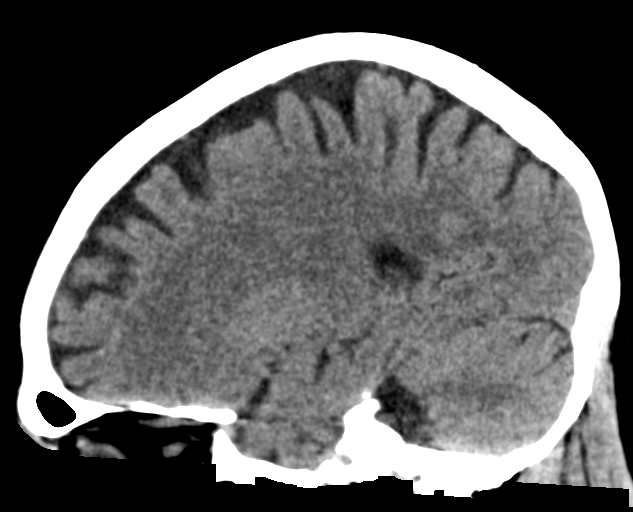

[17 of 47 positions shown; findings below may reference images not displayed]

FINDINGS: Brain: There is no mass, hemorrhage or extra-axial collection. The
size and configuration of the ventricles and extra-axial CSF spaces
are normal. The brain parenchyma is normal, without acute or chronic
infarction.

Vascular: No abnormal hyperdensity of the major intracranial
arteries or dural venous sinuses. No intracranial atherosclerosis.

Skull: The visualized skull base, calvarium and extracranial soft
tissues are normal.

Sinuses/Orbits: No fluid levels or advanced mucosal thickening of
the visualized paranasal sinuses. No mastoid or middle ear effusion.
The orbits are normal.
IMPRESSION: Normal head CT.

## 2022-02-22 MED ORDER — REPATHA SURECLICK 140 MG/ML ~~LOC~~ SOAJ: 1.0000 mL | SUBCUTANEOUS | 3 refills | Status: DC

## 2022-02-22 NOTE — Telephone Encounter (Signed)
PA for Repatha submitted..  Key: [id number].    Approved through 02/22/23.

## 2022-02-23 NOTE — Telephone Encounter (Signed)
Spoke with patient who is aware

## 2022-03-10 ENCOUNTER — Other Ambulatory Visit (INDEPENDENT_AMBULATORY_CARE_PROVIDER_SITE_OTHER): Payer: BC Managed Care – PPO

## 2022-03-10 DIAGNOSIS — Z23 Encounter for immunization: Secondary | ICD-10-CM

## 2022-03-10 LAB — HM MAMMOGRAPHY

## 2022-03-16 ENCOUNTER — Encounter: Payer: Self-pay | Admitting: *Deleted

## 2022-03-30 ENCOUNTER — Telehealth: Payer: Self-pay

## 2022-03-30 DIAGNOSIS — Z794 Long term (current) use of insulin: Secondary | ICD-10-CM

## 2022-03-30 NOTE — Telephone Encounter (Signed)
Pt contacting office to advise insurance will not be covering Levemir. Insurance will now cover Lantus starting 04/25/2022.

## 2022-03-31 ENCOUNTER — Other Ambulatory Visit: Payer: Self-pay | Admitting: Internal Medicine

## 2022-03-31 DIAGNOSIS — Z794 Long term (current) use of insulin: Secondary | ICD-10-CM

## 2022-03-31 MED ORDER — LANTUS SOLOSTAR 100 UNIT/ML ~~LOC~~ SOPN: 50.0000 [IU] | PEN_INJECTOR | Freq: Every day | SUBCUTANEOUS | 1 refills | Status: DC

## 2022-03-31 NOTE — Telephone Encounter (Signed)
Rx sent for new insulin

## 2022-04-11 ENCOUNTER — Other Ambulatory Visit: Payer: Self-pay | Admitting: Internal Medicine

## 2022-04-11 DIAGNOSIS — E118 Type 2 diabetes mellitus with unspecified complications: Secondary | ICD-10-CM

## 2022-04-20 ENCOUNTER — Telehealth: Payer: Self-pay | Admitting: Cardiovascular Disease

## 2022-04-20 MED ORDER — NITROGLYCERIN 0.4 MG SL SUBL: SUBLINGUAL_TABLET | SUBLINGUAL | 3 refills | Status: DC

## 2022-04-20 NOTE — Telephone Encounter (Signed)
Will address at clinic visit.   Haley Altemose S Bernabe Dorce, NP  

## 2022-04-20 NOTE — Telephone Encounter (Signed)
Patient reported increasing SOB over the past week and weight gain of 7 pounds since 12/20. Edema in ankles and feet (more than usual). Exertional SOB and PND. Denies chest pain, lightheadedness/dizziness. Appointment made with Haley George for 12/28. Recommended to patient that if SOB gets any worse, to go to the ED. She verbalized understanding.

## 2022-04-20 NOTE — Telephone Encounter (Signed)
Pt c/o Shortness Of Breath: STAT if SOB developed within the last 24 hours or pt is noticeably SOB on the phone  1. Are you currently SOB (can you hear that pt is SOB on the phone)? No  2. How long have you been experiencing SOB? 2 weeks but really noticeable last night  3. Are you SOB when sitting or when up moving around? Moving around   4. Are you currently experiencing any other symptoms? Ankles swollen

## 2022-04-20 NOTE — Progress Notes (Unsigned)
Office Visit    Patient Name: Haley George Date of Encounter: 04/21/2022  PCP:  Denita Lung, MD   [place]  Cardiologist:  Sanda Klein, MD  Advanced Practice Provider:  No care team member to display Electrophysiologist:  None      Chief Complaint    Haley George is a 66 y.o. female presents today for shortness of breath, edema   Past Medical History    Past Medical History:  Diagnosis Date   Asthma    Cancer ([place])    basal cell chest   Complication of anesthesia    Coronary artery disease    Diabetes mellitus without complication ([place])    type 2   Heart murmur    Hepatitis    auto- immune   History of kidney stones    History of nuclear stress test 05/21/2010   dipyridamole; normal perfusion, preserved LV systolic EF of 31%   Hyperlipidemia    Hypertension    Liver failure ([place])    h/o autoimmune hepatitis    Myocardial infarction ([place])    Neuropathy    resolved with back surgery; tingling and burning left leg to foot   Obesity    PONV (postoperative nausea and vomiting)    Psoriasis    Tubular adenoma of colon    colon path 01/09/2017   Past Surgical History:  Procedure Laterality Date   Carotid Doppler  10/2008   R & L ICAs 0-49% diameter reduction    COLONOSCOPY     CORONARY ANGIOPLASTY WITH STENT PLACEMENT  09/2008   2.25x72m Cypher DES to in-stent restenosis of prox LAD; Mini-Vision stent x2 2.0x144mto area distal of initial LAD stent; 3 2.5x1579maxus stents prox to Cypher stent in circumflex   CORONARY ANGIOPLASTY WITH STENT PLACEMENT  09/2005   Cypher DES 3.0x22m26m distal RCA; 2.25x12mm56mi0Vision stent to mid LAD; 2.5x18mm 86mer stent to circumflex   CORONARY STENT INTERVENTION N/A 11/13/2018   Procedure: CORONARY STENT INTERVENTION;  Surgeon: VaranaJettie Booze [facility];  Service: Cardiovascular;  Laterality: N/A;   CORONARY STENT INTERVENTION N/A 12/02/2019   Procedure: CORONARY  STENT INTERVENTION;  Surgeon: End, CNelva Bush [facility];  Service: Cardiovascular;  Laterality: N/A;   LEFT HEART CATH AND CORONARY ANGIOGRAPHY N/A 11/13/2018   Procedure: LEFT HEART CATH AND CORONARY ANGIOGRAPHY;  Surgeon: VaranaJettie Booze [facility];  Service: Cardiovascular;  Laterality: N/A;   LEFT HEART CATH AND CORONARY ANGIOGRAPHY N/A 12/02/2019   Procedure: LEFT HEART CATH AND CORONARY ANGIOGRAPHY;  Surgeon: End, CNelva Bush [facility];  Service: Cardiovascular;  Laterality: N/A;   LUMBAR LAMINECTOMY/DECOMPRESSION MICRODISCECTOMY Left 10/18/2016   Procedure: Left Lumbar Four-Five Microdiscectomy;  Surgeon: Stern,Erline Levine [facility];[place]: Neurosurgery;  Laterality: Left;  Left L4-5 Microdiscectomy   ROTATOR CUFF REPAIR Left    SHOULDER ARTHROSCOPY W/ ROTATOR CUFF REPAIR Right    TRANSTHORACIC ECHOCARDIOGRAM  04/2010   EF=>55%, borderline conc LVH; trace MR & TR; AV mildly sclerotic; aortic root sclerosis/calcif   TRIGGER FINGER RELEASE Left 02/19/2020   Procedure: RELEASE TRIGGER FINGER/A-1 PULLEY;  Surgeon: CaffreEarlie Server [facility];[place]: Orthopedics;  Laterality: Left;   ureteral stents      Allergies  Allergies  Allergen Reactions   Penicillins Anaphylaxis and Hives    PATIENT HAD A PCN REACTION  WITH IMMEDIATE RASH, FACIAL/TONGUE/THROAT SWELLING, SOB, OR LIGHTHEADEDNESS WITH HYPOTENSION:  #  #  #  YES  #  #  #    Has patient had a PCN reaction causing severe rash involving mucus membranes or skin necrosis:Unknown  Has patient had a PCN reaction that required hospitalization:No  Has patient had a PCN reaction occurring within the last 10 years:No  If all of the above answers are "NO", then may proceed with Cephalosporin use.  PATIENT HAD A PCN REACTION WITH IMMEDIATE RASH, FACIAL/TONGUE/THROAT SWELLING, SOB, OR LIGHTHEADEDNESS WITH HYPOTENSION:  #  #  #  YES  #  #  #   Has  patient had a PCN reaction causing severe rash involving mucus membranes or skin necrosis:UnknownHas patient had a PCN reaction that required hospitalization:No Has patient had a PCN reaction occurring within the last 10 years:No If all of the above answers are "NO", then may proceed with Cephalosporin use.   Coconut Flavor Swelling    Mouth breaks out    Cocos Nucifera     Breaks out mouth   Other Swelling    Walnuts cause mouth to break out    History of Present Illness    Haley George is a 66 y.o. female with a hx of DM2, HLD, CAD s/p multiple PCI/DES and restenosis requiring repeat PCI/DES 12/02/19, aortic stenosis last seen 07/14/21 by Dr. Sallyanne Kuster.  Prior coronary intervention detailed below. Most recent coronary intervention was placement of two DES to mid-distal LCx covering de novo mid vessel lesion and distal ISR lesion in setting of small NSTEMI 12/02/19. Echo at that time normal LVEF 55-60% and mild aortic stenosis. Carotid duplex 02/06/20 bild bilateral atherosclerosis <50% bilaterally. Previously transitioned from Carvedilol to BIsoprolol for wheezing. Three day monitor 03/2021 predominantly NSR with no significant bradycardia nor pause. There was mild sinus tachycardia thought to be related to anemia, bronchodilator, or dehydration.  Last seen 07/14/21 noting occasional palpitations which were overall not bothersome. Her diastolic blood pressures were elevated and Bisoprolol increased to 35m. She was recommended to follow up in one year.   She contacted the office yesterday noting shortness of breath and weight gain of 7 pounds over one week as well as LE edema, PND.  She presents today for follow up.  Increasing shortness of breath has been increasing for some time but acutely worse over the last week. Notable with activity such as walking down the hall  or walking up stairs in her home. Tuesday night woke up in the middle of the night feeling breath consistent with  PND. No  orthopnea, chest pain. Her anginal equivalent is heaviness which she notes just a mild amount of over the last couple of days but not as noticeable as previous. Increased edema in her ankles and feet. Also notes leg cramps which are now thigh down - has been standing a lot cooking. It does improve with mustard. No palpitations. Mild cough - she is concerned she might have CHF. Blood pressure and blood sugar both have been running high. Notes feeling thirsty constantly which she attributes to her diabetes. Wonders if she has a UTI due to elevated blood sugars and foul smelling urine.   Previous coronary interventions 2007  - mid LAD 2.2512 mini vision - left circumflex 2.518 Cypher  - RCA 3.023 Cypher   2010  - mid LAD 2.2512 Taxus overlapping 2.012 mini vision, overlapping another 2.012 mini vision -  left circumflex 2.516 Taxus upstream of previous stent.  July 2020  - mid LAD Synergy 2.5 x12 , overlapping the proximal area of restenosis in the mid LAD (short area of 3 stent layers).   August 2021 - mid LCX overlapping 2.5x12 and 2.25x20 drug-eluting Synergy (the distal stents overlaps the previously placed Taxus stent from 2010)  EKGs/Labs/Other Studies Reviewed:   The following studies were reviewed today: ECHO 10/30/2019:     1. Left ventricular ejection fraction, by estimation, is 55 to 60%. The  left ventricle has normal function. The left ventricle has no regional  wall motion abnormalities. Left ventricular diastolic parameters are  indeterminate.   2. Right ventricular systolic function is normal. The right ventricular  size is normal. There is normal pulmonary artery systolic pressure.   3. The mitral valve is normal in structure. Trivial mitral valve  regurgitation. No evidence of mitral stenosis.   4. The aortic valve is tricuspid. Aortic valve regurgitation is not  visualized. Mild aortic valve stenosis.   5. The inferior vena cava is normal in size with greater  than 50%  respiratory variability, suggesting right atrial pressure of 3 mmHg.    ______________________________________________   12/02/19 Cardiac catheterization   Conclusions: Three-vessel coronary artery disease.  Culprit lesion for NSTEMI is 95% in-stent restenosis of the mid LCx.  There is mild to moderate mid/distal LAD in-stent restenosis, 70% de-novo mid LCx disease at OM2 bifurcation, and 80% rPDA lesion (unchanged from prior catheterization). Upper normal left ventricular filling pressure. Successful PCI to mid LCx using overlapping Synergy 2.5 x 12 mm and 2.25 x 20 mm drug-eluting stents with 10-20% residual stenosis and TIMI-3 flow.   Recommendations: Dual antiplatelet therapy with aspirin and clopidogrel for at least 12 months, ideally indefinitely. Aggressive secondary prevention. Limited echo to reassess LVEF post-MI. Gentle post-cath hydration.   Intervention    _____________________________________________________________  12/02/2019 ECHO  1. Left ventricular ejection fraction, by estimation, is 55 to 60%. The  left ventricle has normal function. The left ventricle has no regional  wall motion abnormalities.   2. Right ventricular systolic function is normal. The right ventricular  size is normal.   3. The mitral valve is grossly normal. Trivial mitral valve  regurgitation. No evidence of mitral stenosis.   4. The inferior vena cava is normal in size with greater than 50%  respiratory variability, suggesting right atrial pressure of 3 mmHg.  ____________________________________________________________  Carotid duplex 01/2020 IMPRESSION: Mild carotid atherosclerosis. No hemodynamically significant ICA stenosis. Degree of narrowing less than 50% bilaterally by ultrasound criteria.   Patent antegrade vertebral flow bilaterally ____________________________________________________________  Monitor 04/2021 The dominant rhythm was normal sinus and mild sinus  tachycardia, with normal circadian variation. There were no significant episodes of bradycardia or pauses. Rare isolated PACs and PVCs are seen. There is no evidence of atrial fibrillation or ventricular tachycardia. During patient triggered recordings, there is normal sinus rhythm.   Overall normal 72-hour arrhythmia on monitor. Normal rhythm during symptom driven recordings. Note tendency to have relatively fast resting heart rates and mild sinus tachycardia during the day.  Consider anemia, thyrotoxicosis, hypovolemia.    EKG:  EKG is ordered today.  The ekg ordered today demonstrates NSR 74 bpm with no acute ST/T wave changes.   Recent Labs: 08/30/2021: ALT 21; BUN 22; Creatinine, Ser 1.41; Hemoglobin 11.5; Platelets 265; Potassium 4.8; Sodium 141  Recent Lipid Panel    Component Value Date/Time   CHOL 78 (L) 08/30/2021 1626   TRIG 258 (H) 08/30/2021 1626   HDL 25 (L)  08/30/2021 1626   CHOLHDL 3.1 08/30/2021 1626   CHOLHDL 6.7 12/02/2019 0336   VLDL UNABLE TO CALCULATE IF TRIGLYCERIDE OVER 400 mg/dL 12/02/2019 0336   LDLCALC 15 08/30/2021 1626   LDLDIRECT 93 11/15/2012 0829     Home Medications   Current Meds  Medication Sig   aspirin EC 81 MG tablet Take 1 tablet (81 mg total) by mouth daily.   BD PEN NEEDLE NANO 2ND GEN 32G X 4 MM MISC USE AS DIRECTED FOR INSULIN ADMINISTRATION   bisoprolol (ZEBETA) 10 MG tablet TAKE 1 TABLET BY MOUTH EVERY DAY   bisoprolol (ZEBETA) 5 MG tablet TAKE 5 MG IN THE MORNING AND 10 MG (TWO TABLETS) IN THE EVENING.   Blood Glucose Monitoring Suppl (ONETOUCH VERIO) w/Device KIT Use to check blood sugar 2 times a day (Patient taking differently: 1 each by Other route See admin instructions. Use to check blood sugar 2 times a day)   BREO ELLIPTA 200-25 MCG/INH AEPB Inhale 1 puff into the lungs at bedtime.   clopidogrel (PLAVIX) 75 MG tablet TAKE 1 TABLET BY MOUTH EVERY DAY WITH BREAKFAST   Coenzyme Q10 300 MG CAPS Take 300 mg by mouth daily.    Cranberry 300 MG tablet Take 300 mg by mouth every evening.   Evolocumab (REPATHA SURECLICK) 951 MG/ML SOAJ Inject 140 mg into the skin every 14 (fourteen) days.   fenofibrate (TRICOR) 145 MG tablet TAKE 1 TABLET (145 MG TOTAL) BY MOUTH EVERY EVENING   furosemide (LASIX) 20 MG tablet Take two tablets (15m) daily for two days then take one (235m daily.   Glucosamine-Chondroit-Vit C-Mn (GLUCOSAMINE CHONDR 1500 COMPLX PO) Take 1 tablet by mouth daily.   glucose blood (ONETOUCH VERIO) test strip Use to check blood sugar 2 times a day   insulin glargine (LANTUS SOLOSTAR) 100 UNIT/ML Solostar Pen Inject 50 Units into the skin daily.   isosorbide mononitrate (IMDUR) 60 MG 24 hr tablet Take 1 tablet (60 mg total) by mouth daily. ** DO NOT CRUSH **   meclizine (ANTIVERT) 25 MG tablet Take 1 tablet (25 mg total) by mouth 3 (three) times daily as needed for dizziness.   metFORMIN (GLUCOPHAGE) 1000 MG tablet TAKE 1 TABLET (1,000 MG TOTAL) BY MOUTH TWICE A DAY WITH FOOD   Multiple Vitamins-Minerals (CENTRUM SILVER 50+WOMEN PO) Take 1 tablet by mouth daily.   nitroGLYCERIN (NITROSTAT) 0.4 MG SL tablet For chest pain, tightness, or pressure. While sitting, place 1 tablet under tongue. May be used every 5 minutes as needed, for up to 15 minutes. Do not use more than 3 tablets.   OZEMPIC, 1 MG/DOSE, 4 MG/3ML SOPN INJECT 1 MG INTO THE SKIN ONCE A WEEK.   ReliOn Lancets Thin 26G MISC 1 Units by Does not apply route 2 (two) times daily. E 11.9   rosuvastatin (CRESTOR) 20 MG tablet TAKE 1 TABLET BY MOUTH EVERY DAY   valsartan (DIOVAN) 160 MG tablet TAKE 1 TABLET BY MOUTH EVERY DAY   VASCEPA 1 g capsule TAKE 2 CAPSULES BY MOUTH TWICE A DAY     Review of Systems      All other systems reviewed and are otherwise negative except as noted above.  Physical Exam    VS:  BP (!) 174/80   Pulse 74   Ht 5' 4.25" (1.632 m)   Wt 188 lb 1.6 oz (85.3 kg)   SpO2 98%   BMI 32.04 kg/m  , BMI Body mass index is 32.04  kg/m.  Wt  Readings from Last 3 Encounters:  04/21/22 188 lb 1.6 oz (85.3 kg)  12/14/21 182 lb 12.8 oz (82.9 kg)  12/13/21 183 lb 6.4 oz (83.2 kg)     GEN: Well nourished, well developed, in no acute distress. HEENT: normal. Neck: Supple, no JVD, carotid bruits, or masses. Cardiac: RRR, gr 3/6 murmur, no rubs, or gallops. No clubbing, cyanosis, 1+ bilateral pretibial edema.  Radials/PT 2+ and equal bilaterally.  Respiratory:  Respirations regular and unlabored, clear to auscultation bilaterally. GI: Soft, nontender, nondistended. MS: No deformity or atrophy. Skin: Warm and dry, no rash. Neuro:  Strength and sensation are intact. Psych: Normal affect.  Assessment & Plan    DOE / LE edema -Bilateral lower extremity edema as well as exertional dyspnea of the last week with 7 pound weight gain. Plan for echocardiogram to rule out worsening aortic stenosis as heart failure as contributory BMP, BNP, CBC today to rule out anemia and assess renal function/electrolytes.  Start Fursoemide 37m x 2 days then 233mQD. If potassium low normal on labs will need to consider supplementation.  CAD -anginal equivalent chest heaviness which is noticeable over the past couple days but more mild compared to previous and she is attributing it more to her shortness of breath.  No indication for ischemic evaluation at this time. GDMT includes Aspirin, Bisoprolol, Plavix, Repatha, Vascepa, Rosuvastatin.   HLD, LDL goal <70 - 08/2021 LDL 15. Continue high dose statin, Vascepa, fenofibrate, Repatha.   HTN -BP above goal.  Add Lasix, as above.  Continue present dose of bisoprolol 5 mg a.m and 10 mg p.m., Imdur 60 mg daily, valsartan 160 mg daily. Discussed to monitor BP at home at least 2 hours after medications and sitting for 5-10 minutes.  If BP remains above goal despite diuresis plan to increase dose of valsartan.  DM2 - 11/2021 A1c 7.9. Follows with endocrinology. Appreciate inclusion of GLP1 and  SGLT2i.  CKD3b - Careful titration of diuretic and antihypertensive.  BMP today for monitoring.  Obesity - Weight loss via diet and exercise encouraged. Discussed the impact being overweight would have on cardiovascular risk.  On Ozempic per primary care but has difficulty getting due to national shortage.  Aortic stenosis - Mild by echo 11/2019.  Known 3 out of 6 murmur on exam.  Will update echocardiogram to rule out new onset heart failure or worsening aortic stenosis as etiology of her dyspnea, lower extremity edema.  Palpitations - She is taking Bisoprolol 52m82mn the morning and 49m18m the evening.  Palpitations overall quiescent.  Asthma - Continue to follow with PCP.     Disposition: Follow up  as scheduled  with MihaSanda Klein or APP.  Signed, CaitLoel Dubonnet 04/21/2022, 1:14 PM [place]

## 2022-04-21 ENCOUNTER — Encounter (HOSPITAL_BASED_OUTPATIENT_CLINIC_OR_DEPARTMENT_OTHER): Payer: Self-pay | Admitting: Family

## 2022-04-21 ENCOUNTER — Ambulatory Visit (HOSPITAL_BASED_OUTPATIENT_CLINIC_OR_DEPARTMENT_OTHER): Payer: BC Managed Care – PPO | Admitting: Family

## 2022-04-21 ENCOUNTER — Other Ambulatory Visit (HOSPITAL_BASED_OUTPATIENT_CLINIC_OR_DEPARTMENT_OTHER): Payer: Self-pay

## 2022-04-21 VITALS — BP 174/80 | HR 74 | Ht 64.25 in | Wt 188.1 lb

## 2022-04-21 DIAGNOSIS — R0609 Other forms of dyspnea: Secondary | ICD-10-CM | POA: Diagnosis not present

## 2022-04-21 DIAGNOSIS — I25118 Atherosclerotic heart disease of native coronary artery with other forms of angina pectoris: Secondary | ICD-10-CM | POA: Diagnosis not present

## 2022-04-21 DIAGNOSIS — I350 Nonrheumatic aortic (valve) stenosis: Secondary | ICD-10-CM | POA: Diagnosis not present

## 2022-04-21 DIAGNOSIS — E785 Hyperlipidemia, unspecified: Secondary | ICD-10-CM | POA: Diagnosis not present

## 2022-04-21 DIAGNOSIS — R600 Localized edema: Secondary | ICD-10-CM

## 2022-04-21 DIAGNOSIS — R300 Dysuria: Secondary | ICD-10-CM

## 2022-04-21 MED ORDER — FUROSEMIDE 20 MG PO TABS
ORAL_TABLET | ORAL | 2 refills | Status: DC
  Filled 2022-04-21: qty 30, 30d supply, fill #0

## 2022-04-21 NOTE — Patient Instructions (Addendum)
Medication Instructions:  Your physician has recommended you make the following change in your medication:   START Furosemide 2 tablets daily for 2 days then one tablet daily   *If you need a refill on your cardiac medications before your next appointment, please call your pharmacy*   Lab Work: Your physician recommends that you return for lab work today: CBC, BMP, BNP, magnesium, urinalysis, urine culture.  If you have labs (blood work) drawn today and your tests are completely normal, you will receive your results only by: [place] (if you have MyChart) OR A paper copy in the mail If you have any lab test that is abnormal or we need to change your treatment, we will call you to review the results.   Testing/Procedures: Your physician has requested that you have an echocardiogram. Echocardiography is a painless test that uses sound waves to create images of your heart. It provides your doctor with information about the size and shape of your heart and how well your heart's chambers and valves are working. This procedure takes approximately one hour. There are no restrictions for this procedure. Please do NOT wear cologne, perfume, aftershave, or lotions (deodorant is allowed). Please arrive 15 minutes prior to your appointment time.    Follow-Up: At [facility], you and your health needs are our priority.  As part of our continuing mission to provide you with exceptional heart care, we have created designated Provider Care Teams.  These Care Teams include your primary Cardiologist (physician) and Advanced Practice Providers (APPs -  Physician Assistants and Nurse Practitioners) who all work together to provide you with the care you need, when you need it.  We recommend signing up for the patient portal called "MyChart".  Sign up information is provided on this After Visit Summary.  MyChart is used to connect with patients for Virtual Visits (Telemedicine).  Patients are  able to view lab/test results, encounter notes, upcoming appointments, etc.  Non-urgent messages can be sent to your provider as well.   To learn more about what you can do with MyChart, go to [email or web address].    Your next appointment:   As scheduled with Dr. Sallyanne Kuster  Other Instructions  To prevent or reduce lower extremity swelling: Eat a low salt diet. Salt makes the body hold onto extra fluid which causes swelling. Sit with legs elevated. For example, in the recliner or on an [place].  Wear knee-high compression stockings during the daytime. Ones labeled 15-20 mmHg provide good compression.

## 2022-04-22 ENCOUNTER — Ambulatory Visit (HOSPITAL_COMMUNITY)
Admission: RE | Admit: 2022-04-22 | Discharge: 2022-04-22 | Disposition: A | Discharge status: Home / Self Care | Payer: BC Managed Care – PPO | Source: Ambulatory Visit | Attending: Family | Admitting: Family

## 2022-04-22 ENCOUNTER — Telehealth (HOSPITAL_BASED_OUTPATIENT_CLINIC_OR_DEPARTMENT_OTHER): Payer: Self-pay

## 2022-04-22 DIAGNOSIS — R600 Localized edema: Secondary | ICD-10-CM | POA: Insufficient documentation

## 2022-04-22 DIAGNOSIS — I350 Nonrheumatic aortic (valve) stenosis: Secondary | ICD-10-CM | POA: Insufficient documentation

## 2022-04-22 DIAGNOSIS — R0609 Other forms of dyspnea: Secondary | ICD-10-CM | POA: Insufficient documentation

## 2022-04-22 LAB — ECHOCARDIOGRAM COMPLETE
AR max vel: 1.22 cm2
AV Area VTI: 1.19 cm2
AV Area mean vel: 1.3 cm2
AV Mean grad: 20.8 mmHg
AV Peak grad: 37.5 mmHg
Ao pk vel: 3.06 m/s
Area-P 1/2: 3.65 cm2
S' Lateral: 2.8 cm

## 2022-04-22 LAB — CBC
Hematocrit: 31 % — ABNORMAL LOW (ref 34.0–46.6)
Hemoglobin: 9.3 g/dL — ABNORMAL LOW (ref 11.1–15.9)
MCH: 24.2 pg — ABNORMAL LOW (ref 26.6–33.0)
MCHC: 30 g/dL — ABNORMAL LOW (ref 31.5–35.7)
MCV: 81 fL (ref 79–97)
Platelets: 257 10*3/uL (ref 150–450)
RBC: 3.84 x10E6/uL (ref 3.77–5.28)
RDW: 14.6 % (ref 11.7–15.4)
WBC: 5.3 10*3/uL (ref 3.4–10.8)

## 2022-04-22 LAB — BASIC METABOLIC PANEL
BUN/Creatinine Ratio: 17 (ref 12–28)
BUN: 23 mg/dL (ref 8–27)
CO2: 21 mmol/L (ref 20–29)
Calcium: 9.6 mg/dL (ref 8.7–10.3)
Chloride: 105 mmol/L (ref 96–106)
Creatinine, Ser: 1.34 mg/dL — ABNORMAL HIGH (ref 0.57–1.00)
Glucose: 250 mg/dL — ABNORMAL HIGH (ref 70–99)
Potassium: 4.8 mmol/L (ref 3.5–5.2)
Sodium: 141 mmol/L (ref 134–144)
eGFR: 44 mL/min/{1.73_m2} — ABNORMAL LOW (ref >59)

## 2022-04-22 LAB — URINALYSIS
Bilirubin, UA: NEGATIVE
Ketones, UA: NEGATIVE
Nitrite, UA: POSITIVE — AB
RBC, UA: NEGATIVE
Specific Gravity, UA: 1.021 (ref 1.005–1.030)
Urobilinogen, Ur: 0.2 mg/dL (ref 0.2–1.0)
pH, UA: 5.5 (ref 5.0–7.5)

## 2022-04-22 LAB — BRAIN NATRIURETIC PEPTIDE: BNP: 246.3 pg/mL — ABNORMAL HIGH (ref 0.0–100.0)

## 2022-04-22 LAB — MAGNESIUM: Magnesium: 1.8 mg/dL (ref 1.6–2.3)

## 2022-04-22 MED ORDER — NITROFURANTOIN MONOHYD MACRO 100 MG PO CAPS: 100.0000 mg | ORAL_CAPSULE | Freq: Two times a day (BID) | ORAL | 0 refills | Status: DC

## 2022-04-22 NOTE — Progress Notes (Signed)
*  PRELIMINARY RESULTS* Echocardiogram 2D Echocardiogram has been performed.  Haley George 04/22/2022, 9:18 AM

## 2022-04-22 NOTE — Telephone Encounter (Addendum)
Called results to patient and left results on VM (ok per DPR), instructions left to call office back if patient has any questions!     ----- Message from Loel Dubonnet, NP sent at 04/22/2022  5:01 PM EST ----- Kidney function stable compared to previous.  Normal electrolytes.  BMP shows volume overload.  Continue with plan for increased dose furosemide for 2 days as discussed in clinic visit.  CBC shows worsening anemia compared to previous with hemoglobin of 9.3 but no evidence of infection.  Urinalysis shows evidence of urinary tract infection.  Recommend Rx Macrobid '100mg'$  BID x 7 days for treatment of UTI   Loel Dubonnet, NP  Gerald Stabs, RN Echocardiogram with normal heart muscle function.  Heart muscle mildly thick which we prevent from worsening by keeping blood pressure well-controlled.  There is moderate stiffening of the aortic valve with mean gradient of 20.8 mmHg.  This is somewhat progressed from previous. Follow-up with Dr. Sallyanne Kuster as scheduled.

## 2022-04-25 NOTE — Progress Notes (Signed)
Thanks. I reviewed the echo and there was clear evidence that the left atrial pressure was high (diastolic function was pseudonormal, not normal). More diuretic was the right choice. AS still moderate.

## 2022-04-28 ENCOUNTER — Encounter: Payer: Self-pay | Admitting: Internal Medicine

## 2022-04-28 ENCOUNTER — Ambulatory Visit: Payer: BC Managed Care – PPO | Admitting: Internal Medicine

## 2022-04-28 VITALS — BP 130/70 | HR 74 | Ht 64.25 in | Wt 185.6 lb

## 2022-04-28 DIAGNOSIS — Z6834 Body mass index (BMI) 34.0-34.9, adult: Secondary | ICD-10-CM

## 2022-04-28 DIAGNOSIS — E1169 Type 2 diabetes mellitus with other specified complication: Secondary | ICD-10-CM

## 2022-04-28 DIAGNOSIS — Z794 Long term (current) use of insulin: Secondary | ICD-10-CM | POA: Diagnosis not present

## 2022-04-28 DIAGNOSIS — E785 Hyperlipidemia, unspecified: Secondary | ICD-10-CM

## 2022-04-28 DIAGNOSIS — E1159 Type 2 diabetes mellitus with other circulatory complications: Secondary | ICD-10-CM

## 2022-04-28 DIAGNOSIS — E6609 Other obesity due to excess calories: Secondary | ICD-10-CM | POA: Diagnosis not present

## 2022-04-28 LAB — POCT GLYCOSYLATED HEMOGLOBIN (HGB A1C): Hemoglobin A1C: 9.1 % — AB (ref 4.0–5.6)

## 2022-04-28 MED ORDER — OZEMPIC (1 MG/DOSE) 4 MG/3ML ~~LOC~~ SOPN
1.0000 mg | PEN_INJECTOR | SUBCUTANEOUS | 3 refills | Status: DC
  Filled 2022-06-03: qty 3, 28d supply, fill #0

## 2022-04-28 MED ORDER — BD PEN NEEDLE NANO 2ND GEN 32G X 4 MM MISC: 3 refills | Status: DC

## 2022-04-28 MED ORDER — FREESTYLE LIBRE 3 SENSOR MISC: 1.0000 | 3 refills | Status: DC

## 2022-04-28 MED ORDER — NOVOLOG FLEXPEN 100 UNIT/ML ~~LOC~~ SOPN: 8.0000 [IU] | PEN_INJECTOR | Freq: Three times a day (TID) | SUBCUTANEOUS | 3 refills | Status: DC

## 2022-04-28 NOTE — Patient Instructions (Addendum)
Please continue: - Metformin 1000 mg 2x a day with meals - Levemir/Lantus 50 units at bedtime  Stop Glipizide.  Try again to start: - Ozempic 1 mg weekly  Try to start: - Novolog 8-12 units 15 min before meals  Also, start the Freestyle libre 3 CGM.  Please come back for a follow-up appointment in 1.5 months.

## 2022-04-28 NOTE — Addendum Note (Signed)
Addended by: Lauralyn Primes on: 04/28/2022 10:29 AM   Modules accepted: Orders

## 2022-04-28 NOTE — Progress Notes (Signed)
Patient ID: Haley George, female   DOB: 10-Jun-1955, 67 y.o.   MRN: [id number]   HPI: Haley George is a 67 y.o.-year-old female, initially referred by her PCP, Dr. Redmond School, returning for follow-up for DM2, dx initially in 1997 2/2 liver failure, then GDM, then re-occuring in 2007, insulin-dependent, uncontrolled, with long-term complications (CAD-s/p NSTEMI in 11/2019, s/p stents, aortic valve stenosis; CKD).  Last visit 4 mo ago.  Interim history: No blurry vision. Off Ozempic due to national shortage for the last 3 months. She has increased thirst and ice craving.  Sugars have been much higher lately, even in the 500s. She has UTIs-drinking cranberry juice. Last UTI was last week.  Reviewed HbA1c levels: Lab Results  Component Value Date   HGBA1C 7.9 (A) 12/14/2021   HGBA1C 6.9 (A) 08/12/2021   HGBA1C 7.0 (A) 01/28/2021   HGBA1C 6.3 (A) 12/08/2020   HGBA1C 7.4 (H) 08/20/2020   HGBA1C 7.0 (A) 07/28/2020   HGBA1C 6.2 (A) 01/28/2020   HGBA1C 6.6 (H) 12/02/2019   HGBA1C 6.4 (H) 10/22/2019   HGBA1C 6.3 (A) 01/29/2019  05/01/2019: HbA1c 6.2% She was on steroid injections in 2019.  Pt is on a regimen of: - Metformin 1000 mg 2x a day, with meals - Glipizide 2.5-5 mg before a meal - added 01/2019 - before a large meal >> only sporadically - Levemir 140 (70 units x2) units at bedtime >> 70 units >> ...45 >> Lantus 50 units at bedtime   - Ozempic 0.5 mg weekly-added 09/2018 >> 1 mg weekly >> 0.75 mg weekly due to gastric discomfort/nausea >> 1 mg weekly >> off x 3 mo She was on Actos before. She did not start [place].  Pt checks her sugars seldom: - am: 98-125 >> 94-126 >> 89, 102-131 >> 110-130 >> 154-239 - 2h after b'fast: 128-131 >> 161, 249 (eating out) >> n/c >> 192-238 - before lunch: 160, 183 >> 139, 146 >> n/c >> 129-190 >> n/c >> 313 - 2h after lunch: 190 >> n/c >> 141-169 >> 133-209 >> n/c >> 209-349 - before dinner: 142-173 >> 131-162 >> 132 >> 131-169 >> n/c >> 302 - 2h  after dinner: 139-170, 248 (ate out) >> 153-202 >> n/c >> 366-440 - bedtime: 146-192 >> n/c >> 141-189 >> 140-171 >> <200 >> 197-498 - nighttime: n/c Lowest sugar was 40 - 2012 >> ...  89 >> 90s; she has hypoglycemia awareness in the 70s. Highest sugar was 368 ... >> 306 (party - no glipizide) >> 249 >> 200s >> 582.  Glucometer: ReliOn  Pt's meals are: - Breakfast: if not skipping - egg + whole wheat toast; cheerios + milk - Lunch: chicken salad, apple, veggies, trisquits  - Dinner: meat + 2 veggies +/- rolls  In the past, she was on [place] for autoimmune hepatitis.  She could not tolerate it and had to stop.  She lost 24 pounds on it.  -+ CKD, last BUN/creatinine:  Lab Results  Component Value Date   BUN 23 04/21/2022   BUN 22 08/30/2021   CREATININE 1.34 (H) 04/21/2022   CREATININE 1.41 (H) 08/30/2021  On Diovan 160.  -+ HL; last set of lipids: Lab Results  Component Value Date   CHOL 78 (L) 08/30/2021   HDL 25 (L) 08/30/2021   LDLCALC 15 08/30/2021   LDLDIRECT 93 11/15/2012   TRIG 258 (H) 08/30/2021   CHOLHDL 3.1 08/30/2021  On Crestor 20, fenofibrate, Vascepa.  Also on Repatha.  - last eye exam  was in 03/2022: No DR reportedly. Dr. Katy Fitch.   - no numbness and tingling in her feet.  Last foot exam 07/2021.  Pt has FH of DM in M, B, M uncle, MGM.  She was admitted for chest pain 10/2018 after an episode of dehydration and had heart catheterization at that time >> had another stent and the previous stent was recanalized. She has a history of autoimmune hepatitis. Also, psoriasis, diagnosed as a child. She lost a signif. amount of weight on [employer] in 2018.   She has a history of vertigo, improved on meclizine. She had Covid in 02/2021 >> could not eat and had GI issues for 3 weeks afterward. She had palpitations after Covid >>  improved. She saw Dr. Sallyanne Kuster.  She has a h/o yeast infections.  ROS: + see HPI  I reviewed pt's medications, allergies, PMH, social  hx, family hx, and changes were documented in the history of present illness. Otherwise, unchanged from my initial visit note.  Past Medical History:  Diagnosis Date   Asthma    Cancer ([place])    basal cell chest   Complication of anesthesia    Coronary artery disease    Diabetes mellitus without complication ([place])    type 2   Heart murmur    Hepatitis    auto- immune   History of kidney stones    History of nuclear stress test 05/21/2010   dipyridamole; normal perfusion, preserved LV systolic EF of 11%   Hyperlipidemia    Hypertension    Liver failure ([place])    h/o autoimmune hepatitis    Myocardial infarction ([place])    Neuropathy    resolved with back surgery; tingling and burning left leg to foot   Obesity    PONV (postoperative nausea and vomiting)    Psoriasis    Tubular adenoma of colon    colon path 01/09/2017   Past Surgical History:  Procedure Laterality Date   Carotid Doppler  10/2008   R & L ICAs 0-49% diameter reduction    COLONOSCOPY     CORONARY ANGIOPLASTY WITH STENT PLACEMENT  09/2008   2.25x7m Cypher DES to in-stent restenosis of prox LAD; Mini-Vision stent x2 2.0x11mto area distal of initial LAD stent; 3 2.5x1574maxus stents prox to Cypher stent in circumflex   CORONARY ANGIOPLASTY WITH STENT PLACEMENT  09/2005   Cypher DES 3.0x22m36m distal RCA; 2.25x12mm53mi0Vision stent to mid LAD; 2.5x18mm 51mer stent to circumflex   CORONARY STENT INTERVENTION N/A 11/13/2018   Procedure: CORONARY STENT INTERVENTION;  Surgeon: VaranaJettie Booze [facility];  Service: Cardiovascular;  Laterality: N/A;   CORONARY STENT INTERVENTION N/A 12/02/2019   Procedure: CORONARY STENT INTERVENTION;  Surgeon: End, CNelva Bush [facility];  Service: Cardiovascular;  Laterality: N/A;   LEFT HEART CATH AND CORONARY ANGIOGRAPHY N/A 11/13/2018   Procedure: LEFT HEART CATH AND CORONARY ANGIOGRAPHY;  Surgeon: VaranaJettie Booze  [facility];  Service: Cardiovascular;  Laterality: N/A;   LEFT HEART CATH AND CORONARY ANGIOGRAPHY N/A 12/02/2019   Procedure: LEFT HEART CATH AND CORONARY ANGIOGRAPHY;  Surgeon: End, CNelva Bush [facility];  Service: Cardiovascular;  Laterality: N/A;   LUMBAR LAMINECTOMY/DECOMPRESSION MICRODISCECTOMY Left 10/18/2016   Procedure: Left Lumbar Four-Five Microdiscectomy;  Surgeon: Stern,Erline Levine [facility] Neurosurgery;  Laterality: Left;  Left L4-5 Microdiscectomy   ROTATOR CUFF REPAIR Left  SHOULDER ARTHROSCOPY W/ ROTATOR CUFF REPAIR Right    TRANSTHORACIC ECHOCARDIOGRAM  04/2010   EF=>55%, borderline conc LVH; trace MR & TR; AV mildly sclerotic; aortic root sclerosis/calcif   TRIGGER FINGER RELEASE Left 02/19/2020   Procedure: RELEASE TRIGGER FINGER/A-1 PULLEY;  Surgeon: Earlie Server, MD;  Location: [facility];  Service: Orthopedics;  Laterality: Left;   ureteral stents     Social History   Socioeconomic History   Marital status: Single    Spouse name: Not on file   Number of children: 1   Years of education: Not on file   Highest education level: Not on file  Occupational History    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  Social Needs   Financial resource strain: Not on file   Food insecurity    Worry: Not on file    Inability: Not on file   Transportation needs    Medical: Not on file    Non-medical: Not on file  Tobacco Use   Smoking status: Never Smoker   Smokeless tobacco: Never Used  Substance and Sexual Activity   Alcohol use: No   Drug use: No   Sexual activity: Not Currently  Lifestyle   Physical activity    Days per week: Not on file    Minutes per session: Not on file   Stress: Not on file  Relationships   Social connections    Talks on phone: Not on file    Gets together: Not on file    Attends religious service: Not on file    Active member of club or  organization: Not on file    Attends meetings of clubs or organizations: Not on file    Relationship status: Not on file   Intimate partner violence    Fear of current or ex partner: Not on file    Emotionally abused: Not on file    Physically abused: Not on file    Forced sexual activity: Not on file  Other Topics Concern   Not on file  Social History Narrative   Not on file   Current Outpatient Medications on File Prior to Visit  Medication Sig Dispense Refill   aspirin EC 81 MG tablet Take 1 tablet (81 mg total) by mouth daily.     BD PEN NEEDLE NANO 2ND GEN 32G X 4 MM MISC USE AS DIRECTED FOR INSULIN ADMINISTRATION 100 each 3   bisoprolol (ZEBETA) 10 MG tablet TAKE 1 TABLET BY MOUTH EVERY DAY 90 tablet 1   bisoprolol (ZEBETA) 5 MG tablet TAKE 5 MG IN THE MORNING AND 10 MG (TWO TABLETS) IN THE EVENING. 270 tablet 2   Blood Glucose Monitoring Suppl (ONETOUCH VERIO) w/Device KIT Use to check blood sugar 2 times a day (Patient taking differently: 1 each by Other route See admin instructions. Use to check blood sugar 2 times a day) 1 kit 0   BREO ELLIPTA 200-25 MCG/INH AEPB Inhale 1 puff into the lungs at bedtime. 3 each 3   calcipotriene (DOVONOX) 0.005 % ointment Apply topically daily in the afternoon. (Patient not taking: Reported on 08/30/2021)     clopidogrel (PLAVIX) 75 MG tablet TAKE 1 TABLET BY MOUTH EVERY DAY WITH BREAKFAST 90 tablet 2   Coenzyme Q10 300 MG CAPS Take 300 mg by mouth daily.     Cranberry 300 MG tablet Take 300 mg by mouth every evening.     dapagliflozin propanediol (FARXIGA) 5 MG TABS tablet Take 1 tablet (5  mg total) by mouth daily before breakfast. (Patient not taking: Reported on 04/21/2022) 90 tablet 3   Evolocumab (REPATHA SURECLICK) 195 MG/ML SOAJ Inject 140 mg into the skin every 14 (fourteen) days. 6 mL 3   fenofibrate (TRICOR) 145 MG tablet TAKE 1 TABLET (145 MG TOTAL) BY MOUTH EVERY EVENING 90 tablet 1   furosemide (LASIX) 20 MG tablet Take two tablets  (36m) daily for two days then take one (257m daily. 30 tablet 2   glipiZIDE (GLUCOTROL) 5 MG tablet Take 0.5-1 tablets (2.5-5 mg total) by mouth daily before supper. (Patient not taking: Reported on 12/13/2021) 90 tablet 3   Glucosamine-Chondroit-Vit C-Mn (GLUCOSAMINE CHONDR 1500 COMPLX PO) Take 1 tablet by mouth daily.     glucose blood (ONETOUCH VERIO) test strip Use to check blood sugar 2 times a day 200 each 12   insulin detemir (LEVEMIR FLEXPEN) 100 UNIT/ML FlexPen INJECT 50 UNITS INTO THE SKIN AT BEDTIME. (Patient not taking: Reported on 04/21/2022) 45 mL 3   insulin glargine (LANTUS SOLOSTAR) 100 UNIT/ML Solostar Pen Inject 50 Units into the skin daily. 45 mL 1   isosorbide mononitrate (IMDUR) 60 MG 24 hr tablet Take 1 tablet (60 mg total) by mouth daily. ** DO NOT CRUSH ** 90 tablet 3   meclizine (ANTIVERT) 25 MG tablet Take 1 tablet (25 mg total) by mouth 3 (three) times daily as needed for dizziness. 30 tablet 1   metFORMIN (GLUCOPHAGE) 1000 MG tablet TAKE 1 TABLET (1,000 MG TOTAL) BY MOUTH TWICE A DAY WITH FOOD 180 tablet 3   Multiple Vitamins-Minerals (CENTRUM SILVER 50+WOMEN PO) Take 1 tablet by mouth daily.     nitrofurantoin, macrocrystal-monohydrate, (MACROBID) 100 MG capsule Take 1 capsule (100 mg total) by mouth 2 (two) times daily. 14 capsule 0   nitroGLYCERIN (NITROSTAT) 0.4 MG SL tablet For chest pain, tightness, or pressure. While sitting, place 1 tablet under tongue. May be used every 5 minutes as needed, for up to 15 minutes. Do not use more than 3 tablets. 25 tablet 3   nystatin cream (MYCOSTATIN) Apply topically as needed. (Patient not taking: Reported on 12/13/2021)     OZEMPIC, 1 MG/DOSE, 4 MG/3ML SOPN INJECT 1 MG INTO THE SKIN ONCE A WEEK. 9 mL 3   ReliOn Lancets Thin 26G MISC 1 Units by Does not apply route 2 (two) times daily. E 11.9 100 each 12   rosuvastatin (CRESTOR) 20 MG tablet TAKE 1 TABLET BY MOUTH EVERY DAY 90 tablet 3   valsartan (DIOVAN) 160 MG tablet TAKE 1  TABLET BY MOUTH EVERY DAY 90 tablet 0   VASCEPA 1 g capsule TAKE 2 CAPSULES BY MOUTH TWICE A DAY 360 capsule 0   No current facility-administered medications on file prior to visit.   Allergies  Allergen Reactions   Penicillins Anaphylaxis and Hives    PATIENT HAD A PCN REACTION WITH IMMEDIATE RASH, FACIAL/TONGUE/THROAT SWELLING, SOB, OR LIGHTHEADEDNESS WITH HYPOTENSION:  #  #  #  YES  #  #  #    Has patient had a PCN reaction causing severe rash involving mucus membranes or skin necrosis:Unknown  Has patient had a PCN reaction that required hospitalization:No  Has patient had a PCN reaction occurring within the last 10 years:No  If all of the above answers are "NO", then may proceed with Cephalosporin use.  PATIENT HAD A PCN REACTION WITH IMMEDIATE RASH, FACIAL/TONGUE/THROAT SWELLING, SOB, OR LIGHTHEADEDNESS WITH HYPOTENSION:  #  #  #  YES  #  #  #  Has patient had a PCN reaction causing severe rash involving mucus membranes or skin necrosis:UnknownHas patient had a PCN reaction that required hospitalization:No Has patient had a PCN reaction occurring within the last 10 years:No If all of the above answers are "NO", then may proceed with Cephalosporin use.   Coconut Flavor Swelling    Mouth breaks out    Cocos Nucifera     Breaks out mouth   Other Swelling    Walnuts cause mouth to break out   Family History  Problem Relation Age of Onset   CAD Mother    CAD Father    Diabetes Brother    PE: BP 130/70 (BP Location: Right Arm, Patient Position: Sitting, Cuff Size: Normal)   Pulse 74   Ht 5' 4.25" (1.632 m)   Wt 185 lb 9.6 oz (84.2 kg)   SpO2 99%   BMI 31.61 kg/m  Wt Readings from Last 3 Encounters:  04/28/22 185 lb 9.6 oz (84.2 kg)  04/21/22 188 lb 1.6 oz (85.3 kg)  12/14/21 182 lb 12.8 oz (82.9 kg)   Constitutional: overweight, in NAD Eyes: EOMI, no exophthalmos ENT: no thyromegaly, no cervical lymphadenopathy Cardiovascular: RRR, No RG, +2/6 SEM Respiratory: CTA  B Musculoskeletal: no deformities Skin: no rashes Neurological: no tremor with outstretched hands  ASSESSMENT: 1. DM2, insulin-dependent, uncontrolled, with long-term complications - CAD-s/p AMI, s/p stents, aortic valve stenosis - CKD  2. HL  3.  Obesity class I  PLAN:  1. Patient with longstanding, uncontrolled, type 2 diabetes, with worsening control.  Since last visit, she only continued her metformin and Levemir.  She is not taking glipizide, as she is forgetting it.  Also, she did not pick up Farxiga from the pharmacy as recommended at last visit.  She was also not able to get Ozempic for the last 3 months due to the national shortage.   -Reviewing her blood sugars at home, they are much higher, with a hyperglycemic peak in the 580s yesterday.  The rest of the blood sugars are also very high throughout the day and improving overnight. -At today's visit I recommended to stop glipizide and not start Farxiga at least for now.  We need mealtime insulin as she appears to be quite glucotoxicity.  I recommended NovoLog taken 15 minutes before each meal.  Discussed about how to adjust the dose based on the size and consistency of her meals.  I also advised her that she could take a few units of NovoLog for correction if sugars remain high after a meal, but for now, I would not give her a slightly long purpose, not to complicate the regimen.  This is something that we can do at next visit.  Will continue the same dose of long-acting insulin for now.  I also printed her a prescription for Ozempic to see if she can get it from another pharmacy. -At today's visit, she inquires about the CGM.  I sent a prescription for the freestyle libre 3 CGM to her pharmacy and explained how this works.  I am hoping she is able to start it. - I suggested to:  Patient Instructions  Please continue: - Metformin 1000 mg 2x a day with meals - Levemir/Lantus 50 units at bedtime  Stop Glipizide.  Try again to  start: - Ozempic 1 mg weekly  Try to start: - Novolog 8-12 units 15 min before meals  Also, start the Freestyle libre 3 CGM.  Please come back for a follow-up appointment in  1.5 months.  - we checked her HbA1c: 9.1% (much higher) - advised to check sugars at different times of the day - 4x a day, rotating check times - advised for yearly eye exams >> she is UTD - return to clinic in 4 months  2. HL -Reviewed latest lipid panel from 08/2021: LDL at goal, HDL low, triglycerides high: Lab Results  Component Value Date   CHOL 78 (L) 08/30/2021   HDL 25 (L) 08/30/2021   LDLCALC 15 08/30/2021   LDLDIRECT 93 11/15/2012   TRIG 258 (H) 08/30/2021   CHOLHDL 3.1 08/30/2021  -Continues on Crestor 10 mg daily, fenofibrate 145 mg daily, Vascepa 2 g twice a day, Repatha.  No side effects.  3.  Obesity class I -She was not able to start an SGLT2 inhibitor since last visit. -She gained 3 pounds since last visit despite glucotoxicity -Will try again to start Ozempic, but unfortunately we have to add mealtime insulin.  [name], MD PhD [facility]

## 2022-05-04 ENCOUNTER — Encounter: Payer: Self-pay | Admitting: Cardiovascular Disease

## 2022-05-04 ENCOUNTER — Ambulatory Visit: Payer: BC Managed Care – PPO | Attending: Cardiovascular Disease | Admitting: Cardiovascular Disease

## 2022-05-04 VITALS — BP 122/70 | HR 90 | Ht 64.25 in | Wt 183.8 lb

## 2022-05-04 DIAGNOSIS — J4530 Mild persistent asthma, uncomplicated: Secondary | ICD-10-CM

## 2022-05-04 DIAGNOSIS — I25118 Atherosclerotic heart disease of native coronary artery with other forms of angina pectoris: Secondary | ICD-10-CM

## 2022-05-04 DIAGNOSIS — E782 Mixed hyperlipidemia: Secondary | ICD-10-CM | POA: Diagnosis not present

## 2022-05-04 DIAGNOSIS — E119 Type 2 diabetes mellitus without complications: Secondary | ICD-10-CM

## 2022-05-04 DIAGNOSIS — F329 Major depressive disorder, single episode, unspecified: Secondary | ICD-10-CM

## 2022-05-04 DIAGNOSIS — I10 Essential (primary) hypertension: Secondary | ICD-10-CM

## 2022-05-04 DIAGNOSIS — E669 Obesity, unspecified: Secondary | ICD-10-CM

## 2022-05-04 DIAGNOSIS — D500 Iron deficiency anemia secondary to blood loss (chronic): Secondary | ICD-10-CM

## 2022-05-04 DIAGNOSIS — I5032 Chronic diastolic (congestive) heart failure: Secondary | ICD-10-CM

## 2022-05-04 DIAGNOSIS — N1832 Chronic kidney disease, stage 3b: Secondary | ICD-10-CM | POA: Insufficient documentation

## 2022-05-04 DIAGNOSIS — Z794 Long term (current) use of insulin: Secondary | ICD-10-CM

## 2022-05-04 DIAGNOSIS — L409 Psoriasis, unspecified: Secondary | ICD-10-CM

## 2022-05-04 DIAGNOSIS — I350 Nonrheumatic aortic (valve) stenosis: Secondary | ICD-10-CM

## 2022-05-04 MED ORDER — FERROUS SULFATE 324 (65 FE) MG PO TBEC: 324.0000 mg | DELAYED_RELEASE_TABLET | Freq: Two times a day (BID) | ORAL | 3 refills | Status: DC

## 2022-05-04 MED ORDER — MAGNESIUM OXIDE 400 MG PO CAPS: 400.0000 mg | ORAL_CAPSULE | Freq: Two times a day (BID) | ORAL | 3 refills | Status: DC

## 2022-05-04 MED ORDER — FUROSEMIDE 20 MG PO TABS: ORAL_TABLET | ORAL | 2 refills | Status: DC

## 2022-05-04 NOTE — Progress Notes (Addendum)
Patient ID: Haley George, female   DOB: 1956/03/13, 67 y.o.   MRN: [id number]    Cardiology Office Note    Date:  05/04/2022   ID:  Haley George, DOB 1955-06-14, MRN [id number]  PCP:  Denita Lung, MD  Cardiologist:   Sanda Klein, MD   Chief Complaint  Patient presents with   Shortness of Breath  CAD  History of Present Illness:  Haley George is a 67 y.o. female with diabetes mellitus type II, severe mixed hyperlipidemia, CAD requiring PCI in all 3 major coronary arteries including redo PCI for restenosis, mild calcific aortic stenosis, chronic diastolic heart failure, mild bilateral carotid artery stenosis (less than 50%), reactive airway disease (was better on bisoprolol rather than carvedilol).  Most recent intervention was placement of 2 drug-eluting stents in the mid-distal left circumflex coronary artery for both Denovo lesion and distal in-stent restenosis (small non-STEMI with peak troponin 1800 in 17-Dec-2019).   She was seen by Laurann Montana about a week ago due to complaints of worsening shortness of breath and a 7 pound weight gain, lower extremity edema.  She has some chest discomfort moving furniture.  She was prescribed diuretics.  She has lost about 7 pounds and her dyspnea and edema have resolved.  She can climb the stairs in her home without shortness of breath.  She has not had any more chest discomfort.  She saw Dr. Cruzita Lederer a few days ago.  She has been unable to get the Ozempic due to the national shortage was missing doses of glipizide.  Last 3 months and glycemic control has deteriorated with glucose levels between reaching 500s.  She was drinking cranberry juice to help avoid urinary tract infections.  She was not checking her blood sugar frequently.  She has not really resolved.  Clearly the major reason for the less meticulous attention to her health has been depression.  It's been 2 years since Haley George passed away in 2022/12/17.  Her daughter Haley George has finished  her education and it's pretty clear that she will be living in [place], father from home.  The holidays alone were difficult.  She was a little teary-eyed today.    Her echocardiogram performed 04/22/2022 shows that she still has normal left ventricular systolic function and wall motion.  Aortic stenosis has progressed and is now moderate (valve area 1.2 cm, mean gradient 21 mmHg, dimensionless index 0.47).  There was evidence of elevated mean left atrial pressure/pseudonormal mitral inflow (diastolic parameters are not normal as was reported).  In addition she has developed anemia.  Her hemoglobin was 9.3 with microcytic indices suggesting iron deficiency.  She is on chronic dual antiplatelet therapy with aspirin and clopidogrel.  She has previously had a colonoscopy by Dr. May) she is due for repeat colonoscopy this year.  Psoriasis is also not fully controlled.  In the past treatment with [place] Nail was very poorly tolerated (rapid weight loss).  She is considering treatment with Stelara.  Her dermatologist is Dr. Delman Cheadle.    She developed unstable angina and she underwent cardiac catheterization on December 02, 2019.  The culprit lesion was found to be a 95% in-stent restenosis in the mid left circumflex coronary artery, as well as a de novo 70% mid left circumflex stenosis at the OM2 bifurcation.  This was treated by placement of overlapping 2.5x12 and 2.25x20 drug-eluting Synergy stents (Dr. Saunders Revel).  Also noted was a mild-moderate mid to distal LAD in-stent restenosis and an unchanged 80% RPDA  lesion.  She had complete relief of her angina following this intervention.    Previous coronary interventions 2007  - mid LAD 2.2512 mini vision - left circumflex 2.518 Cypher  - RCA 3.023 Cypher  2010  - mid LAD 2.2512 Taxus overlapping 2.012 mini vision, overlapping another 2.012 mini vision -  left circumflex 2.516 Taxus upstream of previous stent.  July 2020  - mid LAD Synergy 2.5 x12 ,  overlapping the proximal area of restenosis in the mid LAD (short area of 3 stent layers).  August 2021 - mid LCX overlapping 2.5x12 and 2.25x20 drug-eluting Synergy (the distal stent overlaps the previously placed Taxus stent from 2010)  She had a normal nuclear stress test in November 2016. EF was 56%. She had a "false positive" ECG response.  In July 2020 she had a moderate sized area of ischemia in the anteroseptal wall, EF 52%, Prior to placement of the mid LAD stent.  Echo in August 2021 showed normal left ventricular regional motion and EF 55-60%.  Past Medical History:  Diagnosis Date   Asthma    Cancer ([place])    basal cell chest   Complication of anesthesia    Coronary artery disease    Diabetes mellitus without complication ([place])    type 2   Heart murmur    Hepatitis    auto- immune   History of kidney stones    History of nuclear stress test 05/21/2010   dipyridamole; normal perfusion, preserved LV systolic EF of 53%   Hyperlipidemia    Hypertension    Liver failure ([place])    h/o autoimmune hepatitis    Myocardial infarction ([place])    Neuropathy    resolved with back surgery; tingling and burning left leg to foot   Obesity    PONV (postoperative nausea and vomiting)    Psoriasis    Tubular adenoma of colon    colon path 01/09/2017    Past Surgical History:  Procedure Laterality Date   Carotid Doppler  10/2008   R & L ICAs 0-49% diameter reduction    COLONOSCOPY     CORONARY ANGIOPLASTY WITH STENT PLACEMENT  09/2008   2.25x27m Cypher DES to in-stent restenosis of prox LAD; Mini-Vision stent x2 2.0x150mto area distal of initial LAD stent; 3 2.5x1575maxus stents prox to Cypher stent in circumflex   CORONARY ANGIOPLASTY WITH STENT PLACEMENT  09/2005   Cypher DES 3.0x22m85m distal RCA; 2.25x12mm23mi0Vision stent to mid LAD; 2.5x18mm 39mer stent to circumflex   CORONARY STENT INTERVENTION N/A 11/13/2018   Procedure: CORONARY STENT INTERVENTION;  Surgeon: VaranaJettie Booze [facility];  Service: Cardiovascular;  Laterality: N/A;   CORONARY STENT INTERVENTION N/A 12/02/2019   Procedure: CORONARY STENT INTERVENTION;  Surgeon: End, CNelva Bush [facility];  Service: Cardiovascular;  Laterality: N/A;   LEFT HEART CATH AND CORONARY ANGIOGRAPHY N/A 11/13/2018   Procedure: LEFT HEART CATH AND CORONARY ANGIOGRAPHY;  Surgeon: VaranaJettie Booze [facility];  Service: Cardiovascular;  Laterality: N/A;   LEFT HEART CATH AND CORONARY ANGIOGRAPHY N/A 12/02/2019   Procedure: LEFT HEART CATH AND CORONARY ANGIOGRAPHY;  Surgeon: End, CNelva Bush [facility];  Service: Cardiovascular;  Laterality: N/A;   LUMBAR LAMINECTOMY/DECOMPRESSION MICRODISCECTOMY Left 10/18/2016   Procedure: Left Lumbar Four-Five Microdiscectomy;  Surgeon: Stern,Erline Levine [facility];[place]: Neurosurgery;  Laterality: Left;  Left L4-5 Microdiscectomy  ROTATOR CUFF REPAIR Left    SHOULDER ARTHROSCOPY W/ ROTATOR CUFF REPAIR Right    TRANSTHORACIC ECHOCARDIOGRAM  04/2010   EF=>55%, borderline conc LVH; trace MR & TR; AV mildly sclerotic; aortic root sclerosis/calcif   TRIGGER FINGER RELEASE Left 02/19/2020   Procedure: RELEASE TRIGGER FINGER/A-1 PULLEY;  Surgeon: Earlie Server, MD;  Location: [facility];  Service: Orthopedics;  Laterality: Left;   ureteral stents      Outpatient Medications Prior to Visit  Medication Sig Dispense Refill   aspirin EC 81 MG tablet Take 1 tablet (81 mg total) by mouth daily.     bisoprolol (ZEBETA) 10 MG tablet TAKE 1 TABLET BY MOUTH EVERY DAY 90 tablet 1   bisoprolol (ZEBETA) 5 MG tablet TAKE 5 MG IN THE MORNING AND 10 MG (TWO TABLETS) IN THE EVENING. 270 tablet 2   Blood Glucose Monitoring Suppl (ONETOUCH VERIO) w/Device KIT Use to check blood sugar 2 times a day (Patient taking differently: 1 each by Other route See admin instructions. Use to check blood sugar 2 times a  day) 1 kit 0   BREO ELLIPTA 200-25 MCG/INH AEPB Inhale 1 puff into the lungs at bedtime. 3 each 3   calcipotriene (DOVONOX) 0.005 % ointment Apply topically daily in the afternoon.     clopidogrel (PLAVIX) 75 MG tablet TAKE 1 TABLET BY MOUTH EVERY DAY WITH BREAKFAST 90 tablet 2   Coenzyme Q10 300 MG CAPS Take 300 mg by mouth daily.     Continuous Blood Gluc Sensor (FREESTYLE LIBRE 3 SENSOR) MISC 1 each by Does not apply route every 14 (fourteen) days. 6 each 3   Cranberry 300 MG tablet Take 300 mg by mouth every evening.     Evolocumab (REPATHA SURECLICK) 824 MG/ML SOAJ Inject 140 mg into the skin every 14 (fourteen) days. 6 mL 3   fenofibrate (TRICOR) 145 MG tablet TAKE 1 TABLET (145 MG TOTAL) BY MOUTH EVERY EVENING 90 tablet 1   Glucosamine-Chondroit-Vit C-Mn (GLUCOSAMINE CHONDR 1500 COMPLX PO) Take 1 tablet by mouth daily.     glucose blood (ONETOUCH VERIO) test strip Use to check blood sugar 2 times a day 200 each 12   insulin aspart (NOVOLOG FLEXPEN) 100 UNIT/ML FlexPen Inject 8-12 Units into the skin 3 (three) times daily before meals. 30 mL 3   insulin glargine (LANTUS SOLOSTAR) 100 UNIT/ML Solostar Pen Inject 50 Units into the skin daily. 45 mL 1   Insulin Pen Needle (BD PEN NEEDLE NANO 2ND GEN) 32G X 4 MM MISC USE AS DIRECTED FOR INSULIN ADMINISTRATION 300 each 3   isosorbide mononitrate (IMDUR) 60 MG 24 hr tablet Take 1 tablet (60 mg total) by mouth daily. ** DO NOT CRUSH ** 90 tablet 3   metFORMIN (GLUCOPHAGE) 1000 MG tablet TAKE 1 TABLET (1,000 MG TOTAL) BY MOUTH TWICE A DAY WITH FOOD 180 tablet 3   Multiple Vitamins-Minerals (CENTRUM SILVER 50+WOMEN PO) Take 1 tablet by mouth daily.     ReliOn Lancets Thin 26G MISC 1 Units by Does not apply route 2 (two) times daily. E 11.9 100 each 12   rosuvastatin (CRESTOR) 20 MG tablet TAKE 1 TABLET BY MOUTH EVERY DAY 90 tablet 3   Semaglutide, 1 MG/DOSE, (OZEMPIC, 1 MG/DOSE,) 4 MG/3ML SOPN Inject 1 mg into the skin once a week. 9 mL 3    valsartan (DIOVAN) 160 MG tablet TAKE 1 TABLET BY MOUTH EVERY DAY 90 tablet 0   VASCEPA 1 g capsule TAKE 2 CAPSULES BY MOUTH TWICE  A DAY 360 capsule 0   furosemide (LASIX) 20 MG tablet Take two tablets ('40mg'$ ) daily for two days then take one ('20mg'$ ) daily. 30 tablet 2   insulin detemir (LEVEMIR FLEXPEN) 100 UNIT/ML FlexPen INJECT 50 UNITS INTO THE SKIN AT BEDTIME. (Patient not taking: Reported on 05/04/2022) 45 mL 3   meclizine (ANTIVERT) 25 MG tablet Take 1 tablet (25 mg total) by mouth 3 (three) times daily as needed for dizziness. (Patient not taking: Reported on 05/04/2022) 30 tablet 1   nitrofurantoin, macrocrystal-monohydrate, (MACROBID) 100 MG capsule Take 1 capsule (100 mg total) by mouth 2 (two) times daily. (Patient not taking: Reported on 05/04/2022) 14 capsule 0   nitroGLYCERIN (NITROSTAT) 0.4 MG SL tablet For chest pain, tightness, or pressure. While sitting, place 1 tablet under tongue. May be used every 5 minutes as needed, for up to 15 minutes. Do not use more than 3 tablets. (Patient not taking: Reported on 05/04/2022) 25 tablet 3   nystatin cream (MYCOSTATIN) Apply topically as needed. (Patient not taking: Reported on 05/04/2022)     No facility-administered medications prior to visit.     Allergies:   Penicillins, Coconut flavor, Cocos nucifera, and Other   Social History   Socioeconomic History   Marital status: Married    Spouse name: Not on file   Number of children: 1   Years of education: Not on file   Highest education level: Not on file  Occupational History    Employer: [employer]  Tobacco Use   Smoking status: Never   Smokeless tobacco: Never  Vaping Use   Vaping Use: Never used  Substance and Sexual Activity   Alcohol use: No   Drug use: No   Sexual activity: Not Currently  Other Topics Concern   Not on file  Social History Narrative   Not on file   Social Determinants of Health   Financial Resource Strain: Low Risk  (11/11/2018)    Overall Financial Resource Strain (CARDIA)    Difficulty of Paying Living Expenses: Not very hard  Food Insecurity: No Food Insecurity (11/11/2018)   Hunger Vital Sign    Worried About Running Out of Food in the Last Year: Never true    [place] in the Last Year: Never true  Transportation Needs: No Transportation Needs (11/11/2018)   PRAPARE - Hydrologist (Medical): No    Lack of Transportation (Non-Medical): No  Physical Activity: Unknown (11/11/2018)   Exercise Vital Sign    Days of Exercise per Week: 2 days    Minutes of Exercise per Session: Not on file  Stress: No Stress Concern Present (11/11/2018)   [place]    Feeling of Stress : Only a little  Social Connections: Unknown (11/11/2018)   Social Connection and Isolation Panel [NHANES]    Frequency of Communication with Friends and Family: Three times a week    Frequency of Social Gatherings with Friends and Family: Not on file    Attends Religious Services: Not on file    Active Member of Clubs or Organizations: Yes    Attends Archivist Meetings: Not on file    Marital Status: Married     ROS:   Please see the history of present illness.    ROS  All other systems are reviewed and are negative.   PHYSICAL EXAM:   VS:  BP 122/70 (BP Location: Left Arm, Patient Position: Sitting,  Cuff Size: Normal)   Pulse 90   Ht 5' 4.25" (1.632 m)   Wt 183 lb 12.8 oz (83.4 kg)   SpO2 99%   BMI 31.30 kg/m      General: Alert, oriented x3, no distress, mild obese Head: no evidence of trauma, PERRL, EOMI, no exophtalmos or lid lag, no myxedema, no xanthelasma; normal ears, nose and oropharynx Neck: normal jugular venous pulsations and no hepatojugular reflux; brisk carotid pulses without delay and no carotid bruits Chest: clear to auscultation, no signs of consolidation by percussion or palpation, normal fremitus, symmetrical  and full respiratory excursions Cardiovascular: normal position and quality of the apical impulse, regular rhythm, normal first and distinct second heart sounds, 3/6 early-mid peaking systolic ejection murmur,no diastolic murmurs, rubs or gallops Abdomen: no tenderness or distention, no masses by palpation, no abnormal pulsatility or arterial bruits, normal bowel sounds, no hepatosplenomegaly Extremities: no clubbing, cyanosis or edema; 2+ radial, ulnar and brachial pulses bilaterally; 2+ right femoral, posterior tibial and dorsalis pedis pulses; 2+ left femoral, posterior tibial and dorsalis pedis pulses; no subclavian or femoral bruits Neurological: grossly nonfocal Psych: Normal mood and affect   Wt Readings from Last 3 Encounters:  05/04/22 183 lb 12.8 oz (83.4 kg)  04/28/22 185 lb 9.6 oz (84.2 kg)  04/21/22 188 lb 1.6 oz (85.3 kg)    Studies/Labs Reviewed:   ECHO12/29/2023     1. Left ventricular ejection fraction, by estimation, is 60 to 65%. The  left ventricle has normal function. The left ventricle has no regional  wall motion abnormalities. There is mild left ventricular hypertrophy.  Left ventricular diastolic parameters  were normal.   2. Right ventricular systolic function is normal. The right ventricular  size is normal. There is normal pulmonary artery systolic pressure.   3. Left atrial size was mildly dilated.   4. The mitral valve is abnormal. Mild mitral valve regurgitation. No  evidence of mitral stenosis.   5. The aortic valve is tricuspid. There is moderate calcification of the  aortic valve. There is moderate thickening of the aortic valve. Aortic  valve regurgitation is not visualized. Moderate aortic valve stenosis.   6. The inferior vena cava is dilated in size with >50% respiratory  variability, suggesting right atrial pressure of 8 mmHg.   LV e' medial:    7.40 cm/s  LV E/e' medial:  16.4  LV e' lateral:   7.29 cm/s  LV E/e' lateral: 16.6   AV Area  (VTI):     1.19 cm  AV Vmax:           306.00 cm/s  AV Peak Grad:      37.5 mmHg  AV Mean Grad:      20.8 mmHg  LVOT/AV VTI ratio: 0.47   EKG: Not ordered today.  Reviewed the tracing from 04/21/2022 shows normal sinus rhythm, very subtle ST segment depression in lead I and T wave inversion in aVL.  Minimal changes from previous tracing in 2022.  Recent Labs: 08/30/2021: ALT 21 04/21/2022: BNP 246.3; BUN 23; Creatinine, Ser 1.34; Hemoglobin 9.3; Magnesium 1.8; Platelets 257; Potassium 4.8; Sodium 141   Lipid Panel    Component Value Date/Time   CHOL 78 (L) 08/30/2021 1626   TRIG 258 (H) 08/30/2021 1626   HDL 25 (L) 08/30/2021 1626   CHOLHDL 3.1 08/30/2021 1626   CHOLHDL 6.7 12/02/2019 0336   VLDL UNABLE TO CALCULATE IF TRIGLYCERIDE OVER 400 mg/dL 12/02/2019 0336   LDLCALC 15 08/30/2021 1626  LDLDIRECT 93 11/15/2012 0829    ASSESSMENT:    1. Chronic diastolic heart failure ([place])   2. Coronary artery disease of native artery of native heart with stable angina pectoris ([place])   3. Mild persistent asthma without complication   4. Mixed hyperlipidemia   5. Nonrheumatic aortic valve stenosis   6. Essential hypertension   7. Insulin-requiring or dependent type II diabetes mellitus ([place])   8. Stage 3b chronic kidney disease ([place])   9. Mild obesity   10. Iron deficiency anemia due to chronic blood loss   11. Psoriasis   12. Reactive depression    PLAN:  In order of problems listed above:  CHF: She had an episode of acute diastolic heart failure in late December.  Although the echo reportedly shows normal diastolic parameters, on review of E/e' is elevated consistent with pseudonormal mitral inflow/elevated mean left atrial pressure.  She has improved with diuretics.  This occurred during a period of poor compliance with dietary restrictions and daily weights, clearly due to depression around the holidays.  She is doing better and seems to be back to "dry weight".  Can change  furosemide to "as needed" for weight 185 lb or higher. She has preserved left ventricular systolic function.  In theory she would benefit from SGLT2 inhibitor such as Wilder Glade, but until we confirmed that she has good glucose control, will defer starting this medication.  Will also need to carefully monitor for increased frequency of urinary tract infections of restart [place].   CAD:   She had a brief episode of chest tightness when moving furniture at church, but this has not happened since and is probably improved with better treatment of her heart failure.  (there's always been some suspicion of a contribution of coronary spasm, but we had to switch from carvedilol to bisoprolol due to reactive airway disease.).  On dual antiplatelet therapy with aspirin and clopidogrel long term due to complex CAD.  On highly effective lipid-lowering therapy with combination statin and Repatha.   Asthma: Seems to be doing well on the more selective beta-blocker. HLP: Excellent LDL, chronically very low HDL.  Will delay rechecking her lipid profile until she has had several recently.  Likely controlled.  She has multiple family members with early onset CAD including an uncle who passed away at age 31 and an uncle that required bypass surgery at 81. She is on a very aggressive lipid-lowering therapy with PCSK9 inhibitor, high-dose statin,  fenofibrate.   AS: Moderate by follow-up echo.  Unlikely to be symptomatic at this point.  Suspect heart failure exacerbation was related to poor compliance with diet, medications and possibly the anemia. Recheck echo in 1 year, sooner if symptoms recur. HTN: well controlled after the increased dose of bisoprolol. DM: Deteriorated glycemic control, A1c 9.1%.  Unable to get ozempic, missed glipizide doses, less attention to diet. Can try Farxiga after her glucose control stabilizes. CKD 3B: Creatinine at baseline. Change furosemide to "as needed".   She has had a couple of events in the  last 3 years where she had acute kidney injury with a creatinine that increased to 2.5-3.0 range, most recently due to her episode of vertigo a few months ago.  It appears that her new creatinine baseline is around 1.3-1.4, which would correspond to a GFR of around 45. Has had a tendency to hyperkalemia.  Consider possibility that she has RTA type IV. Overweight/mildly obese: Steady BMI around 30-31. Recent weight gain was due to fluid.  Anemia: suspect iron deficiency based on clinical history and erythrocyte indices. Start iron supplements, check iron studies and hemoccult. Refer back to Dr. Watt Climes. For now, continue DAPT, but clopidogrel can be stopped if needed due to bleeding or for GI procedures. Will also start magnesium oxide supplements to help with possible constipation from iron supplements and with her muscle cramps. Muscle cramps: Again recommended magnesium oxide 400 mg daily.  She should not take a potassium supplement as her potassium has a tendency to run in the high normal range (RTA type IV?) and she is on an ARB without taking diuretics. Psoriasis:  flaring now. Discussing Stelara with Dr. Delman Cheadle. Depression: worse in last several months. She is staying socially engaged. BPV: No currently active.  This was severe enough to cause hospitalization for a couple of days in early May, but has resolved.  She is aware of [place] maneuvers.  Usually improves promptly when she starts meclizine.   Medication Adjustments/Labs and Tests Ordered: Current medicines are reviewed at length with the patient today.  Concerns regarding medicines are outlined above.  Medication changes, Labs and Tests ordered today are listed in the Patient Instructions below. Patient Instructions  Medication Instructions:  START Magnesium Oxide 400 mg 2 times a day  TAKE Furosemide (Lasix) 20 mg on days weight is 185 lbs or greater  START Ferrous Sulfate (Iron) 324 mg 2 times a day   *If you need a refill on your cardiac  medications before your next appointment, please call your pharmacy*  Lab Work: Your physician recommends that you return for lab work in 6 weeks:  CBC CMP Fasting Lipid Panel-DO NOT eat or drink past midnight  Hemoccult  Iron Magnesium   If you have labs (blood work) drawn today and your tests are completely normal, you will receive your results only by: [facility] (if you have MyChart) OR A paper copy in the mail If you have any lab test that is abnormal or we need to change your treatment, we will call you to review the results.  Testing/Procedures: NONE ordered at this time of appointment   Follow-Up: At [facility], you and your health needs are our priority.  As part of our continuing mission to provide you with exceptional heart care, we have created designated Provider Care Teams.  These Care Teams include your primary Cardiologist (physician) and Advanced Practice Providers (APPs -  Physician Assistants and Nurse Practitioners) who all work together to provide you with the care you need, when you need it.  We recommend signing up for the patient portal called "MyChart".  Sign up information is provided on this After Visit Summary.  MyChart is used to connect with patients for Virtual Visits (Telemedicine).  Patients are able to view lab/test results, encounter notes, upcoming appointments, etc.  Non-urgent messages can be sent to your provider as well.   To learn more about what you can do with MyChart, go to [email or web address].    Your next appointment:   3 month(s)  The format for your next appointment:   In Person  Provider:   Sanda Klein, MD  or APP  Other Instructions  Important Information About Sugar            Signed, Sanda Klein, MD  05/04/2022 11:27 AM    [address], [place],   [id number] Phone: [phone]; Fax: [phone]

## 2022-05-04 NOTE — Patient Instructions (Addendum)
Medication Instructions:  START Magnesium Oxide 400 mg 2 times a day  TAKE Furosemide (Lasix) 20 mg on days weight is 185 lbs or greater  START Ferrous Sulfate (Iron) 324 mg 2 times a day   *If you need a refill on your cardiac medications before your next appointment, please call your pharmacy*  Lab Work: Your physician recommends that you return for lab work in 6 weeks:  CBC CMP Fasting Lipid Panel-DO NOT eat or drink past midnight  Hemoccult  Iron Magnesium   If you have labs (blood work) drawn today and your tests are completely normal, you will receive your results only by: [place] (if you have MyChart) OR A paper copy in the mail If you have any lab test that is abnormal or we need to change your treatment, we will call you to review the results.  Testing/Procedures: NONE ordered at this time of appointment   Follow-Up: At [facility], you and your health needs are our priority.  As part of our continuing mission to provide you with exceptional heart care, we have created designated Provider Care Teams.  These Care Teams include your primary Cardiologist (physician) and Advanced Practice Providers (APPs -  Physician Assistants and Nurse Practitioners) who all work together to provide you with the care you need, when you need it.  We recommend signing up for the patient portal called "MyChart".  Sign up information is provided on this After Visit Summary.  MyChart is used to connect with patients for Virtual Visits (Telemedicine).  Patients are able to view lab/test results, encounter notes, upcoming appointments, etc.  Non-urgent messages can be sent to your provider as well.   To learn more about what you can do with MyChart, go to [email or web address].    Your next appointment:   3 month(s)  The format for your next appointment:   In Person  Provider:   Sanda Klein, MD  or APP  Other Instructions  Important Information About  Sugar

## 2022-05-05 ENCOUNTER — Ambulatory Visit: Payer: BC Managed Care – PPO | Admitting: Family Medicine

## 2022-05-05 ENCOUNTER — Encounter: Payer: Self-pay | Admitting: Family Medicine

## 2022-05-05 VITALS — BP 142/82 | HR 87 | Temp 97.9°F | Resp 18 | Wt 184.0 lb

## 2022-05-05 DIAGNOSIS — Z860100 Personal history of colon polyps, unspecified: Secondary | ICD-10-CM

## 2022-05-05 DIAGNOSIS — J0100 Acute maxillary sinusitis, unspecified: Secondary | ICD-10-CM | POA: Diagnosis not present

## 2022-05-05 DIAGNOSIS — J4530 Mild persistent asthma, uncomplicated: Secondary | ICD-10-CM

## 2022-05-05 DIAGNOSIS — Z794 Long term (current) use of insulin: Secondary | ICD-10-CM

## 2022-05-05 DIAGNOSIS — I350 Nonrheumatic aortic (valve) stenosis: Secondary | ICD-10-CM

## 2022-05-05 DIAGNOSIS — D649 Anemia, unspecified: Secondary | ICD-10-CM

## 2022-05-05 DIAGNOSIS — E119 Type 2 diabetes mellitus without complications: Secondary | ICD-10-CM

## 2022-05-05 MED ORDER — FLUTICASONE FUROATE-VILANTEROL 200-25 MCG/ACT IN AEPB: 1.0000 | INHALATION_SPRAY | Freq: Every day | RESPIRATORY_TRACT | 3 refills | Status: DC

## 2022-05-05 MED ORDER — CLARITHROMYCIN 500 MG PO TABS: 500.0000 mg | ORAL_TABLET | Freq: Two times a day (BID) | ORAL | 0 refills | Status: DC

## 2022-05-05 NOTE — Progress Notes (Signed)
   Subjective:    Patient ID: Haley George, female    DOB: 06-16-1955, 67 y.o.   MRN: [id number]  HPI She is here for consult concerning multiple issues.  Approximately 1 week ago she started having sinus pressure and several days later became painful as well as frontals sinus headache and noting some bloody discharge from her nose.  No fever or chills.  She also has noted some right upper tooth discomfort.  She was recently treated for UTI with Macrobid and finished this several days ago.  She has also had difficulty recently with CHF and has seen cardiology.  She does have recent blood work that she did show anemia.  Review of the record indicates it is also time for colonoscopy.  She is on aspirin as well as Plavix.  She has noted no bloody stool or black tarry stool.  She continues on her Memory Dance and doing well on that but will need a refill on that. She also states that now she is on insulin to get better control of her diabetes.  She sees Dr. Cruzita Lederer for this.  Review of Systems     Objective:   Physical Exam Alert and in no distress. Tympanic membranes and canals are normal. Pharyngeal area is normal. Neck is supple without adenopathy or thyromegaly. Cardiac exam shows a regular sinus rhythm with a grade 2 systolic murmur or gallops. Lungs are clear to auscultation.        Assessment & Plan:  Anemia, unspecified type - Plan: Ambulatory referral to Gastroenterology  Insulin-requiring or dependent type II diabetes mellitus ([place])  Aortic valve stenosis, etiology of cardiac valve disease unspecified  Acute non-recurrent maxillary sinusitis - Plan: clarithromycin (BIAXIN) 500 MG tablet  History of colonic polyps - Plan: Ambulatory referral to Gastroenterology  Mild persistent asthma without complication - Plan: fluticasone furoate-vilanterol (BREO ELLIPTA) 200-25 MCG/ACT AEPB

## 2022-05-07 ENCOUNTER — Other Ambulatory Visit: Payer: Self-pay | Admitting: Cardiovascular Disease

## 2022-05-26 ENCOUNTER — Telehealth: Payer: Self-pay

## 2022-05-26 NOTE — Telephone Encounter (Signed)
   Pre-operative Risk Assessment    Patient Name: Haley George  DOB: January 10, 1956 MRN: [id number]      Request for Surgical Clearance    Procedure:   COLONOSCOPY/ENDOSCOPY WITH PROPOFOL  Date of Surgery:  Clearance 06/24/22                                 Surgeon:  DR. Watt Climes Surgeon's Group or Practice Name:  [facility]  Phone number:  [phone] Fax number:  [phone]   Type of Clearance Requested:   - Pharmacy:  Hold Clopidogrel (Plavix)     Type of Anesthesia:  Not Indicated   Additional requests/questions:    [name]   05/26/2022, 8:54 AM

## 2022-05-26 NOTE — Telephone Encounter (Signed)
Left message for the patient to call back and speak to the on-call preop APP of the day.  Patient was just seen by Dr. Sallyanne Kuster 3 weeks ago.  Last PCI was in August 2021.  As long as she has not had any recurrent chest pain, she should be cleared to proceed with GI procedure and hold Plavix for 5 days.

## 2022-05-26 NOTE — Telephone Encounter (Signed)
   Patient Name: Haley George  DOB: 10-18-1955 MRN: [id number]  Primary Cardiologist: Sanda Klein, MD  Chart reviewed as part of pre-operative protocol coverage. Given past medical history and time since last visit, based on ACC/AHA guidelines, Haley George is at acceptable risk for the planned procedure without further cardiovascular testing.  Since the last visit around 3 weeks ago, patient has not had any chest pain or worsening dyspnea.  She is at acceptable risk to proceed with GI procedure.  She may hold Plavix for 5 days prior to the procedure, we would prefer she continue on aspirin through the procedure.  She will need to restart Plavix after the procedure as soon as possible at the discretion of her GI doctor.  The patient was advised that if she develops new symptoms prior to surgery to contact our office to arrange for a follow-up visit, and she verbalized understanding.  I will route this recommendation to the requesting party via Epic fax function and remove from pre-op pool.  Please call with questions.  [place], [place] 05/26/2022, 2:01 PM

## 2022-06-03 ENCOUNTER — Other Ambulatory Visit (HOSPITAL_BASED_OUTPATIENT_CLINIC_OR_DEPARTMENT_OTHER): Payer: Self-pay

## 2022-06-15 ENCOUNTER — Other Ambulatory Visit: Payer: Self-pay | Admitting: Cardiovascular Disease

## 2022-06-15 ENCOUNTER — Other Ambulatory Visit: Payer: Self-pay | Admitting: Family Medicine

## 2022-06-15 DIAGNOSIS — E1159 Type 2 diabetes mellitus with other circulatory complications: Secondary | ICD-10-CM

## 2022-06-15 DIAGNOSIS — E785 Hyperlipidemia, unspecified: Secondary | ICD-10-CM

## 2022-06-15 DIAGNOSIS — E1169 Type 2 diabetes mellitus with other specified complication: Secondary | ICD-10-CM

## 2022-06-17 ENCOUNTER — Ambulatory Visit: Payer: BC Managed Care – PPO | Admitting: Internal Medicine

## 2022-06-17 ENCOUNTER — Telehealth: Payer: Self-pay | Admitting: Emergency Medicine

## 2022-06-17 LAB — IRON,TIBC AND FERRITIN PANEL
Ferritin: 10 ng/mL — ABNORMAL LOW (ref 15–150)
Iron Saturation: 8 % — CL (ref 15–55)
Iron: 39 ug/dL (ref 27–139)
Total Iron Binding Capacity: 499 ug/dL — ABNORMAL HIGH (ref 250–450)
UIBC: 460 ug/dL — ABNORMAL HIGH (ref 118–369)

## 2022-06-17 LAB — CBC
Hematocrit: 33 % — ABNORMAL LOW (ref 34.0–46.6)
Hemoglobin: 10 g/dL — ABNORMAL LOW (ref 11.1–15.9)
MCH: 23.7 pg — ABNORMAL LOW (ref 26.6–33.0)
MCHC: 30.3 g/dL — ABNORMAL LOW (ref 31.5–35.7)
MCV: 78 fL — ABNORMAL LOW (ref 79–97)
Platelets: 280 10*3/uL (ref 150–450)
RBC: 4.22 x10E6/uL (ref 3.77–5.28)
RDW: 14.9 % (ref 11.7–15.4)
WBC: 4.4 10*3/uL (ref 3.4–10.8)

## 2022-06-17 LAB — COMPREHENSIVE METABOLIC PANEL
ALT: 18 IU/L (ref 0–32)
AST: 29 IU/L (ref 0–40)
Albumin/Globulin Ratio: 1.3 (ref 1.2–2.2)
Albumin: 4.3 g/dL (ref 3.9–4.9)
Alkaline Phosphatase: 40 IU/L — ABNORMAL LOW (ref 44–121)
BUN/Creatinine Ratio: 17 (ref 12–28)
BUN: 21 mg/dL (ref 8–27)
Bilirubin Total: 0.2 mg/dL (ref 0.0–1.2)
CO2: 23 mmol/L (ref 20–29)
Calcium: 9.6 mg/dL (ref 8.7–10.3)
Chloride: 106 mmol/L (ref 96–106)
Creatinine, Ser: 1.27 mg/dL — ABNORMAL HIGH (ref 0.57–1.00)
Globulin, Total: 3.2 g/dL (ref 1.5–4.5)
Glucose: 135 mg/dL — ABNORMAL HIGH (ref 70–99)
Potassium: 5 mmol/L (ref 3.5–5.2)
Sodium: 140 mmol/L (ref 134–144)
Total Protein: 7.5 g/dL (ref 6.0–8.5)
eGFR: 47 mL/min/{1.73_m2} — ABNORMAL LOW (ref >59)

## 2022-06-17 LAB — MAGNESIUM: Magnesium: 1.6 mg/dL (ref 1.6–2.3)

## 2022-06-17 LAB — LIPID PANEL
Chol/HDL Ratio: 5.6 ratio — ABNORMAL HIGH (ref 0.0–4.4)
Cholesterol, Total: 146 mg/dL (ref 100–199)
HDL: 26 mg/dL — ABNORMAL LOW (ref >39)
LDL Chol Calc (NIH): 68 mg/dL (ref 0–99)
Triglycerides: 324 mg/dL — ABNORMAL HIGH (ref 0–149)
VLDL Cholesterol Cal: 52 mg/dL — ABNORMAL HIGH (ref 5–40)

## 2022-06-17 NOTE — Telephone Encounter (Signed)
Dr Sallyanne Kuster said - Labs confirm iron deficiency, although the hemoglobin level is a little better at 10.0, compared to 9.3 a month ago. Anemia is mild. Let's stop the Aspirin, continue clopidogrel only. May do better with an IV iron infusion rather than the iron pills. Has she had stool evaluation for occult bleeding?   - The kidney tests are better than last year  (GFR 47, was 30-45 over previous 12 months).   - Cholesterol is higher than before, but just barely in acceptable range. (has there been an interruption or reduction in Repatha or rosuvastatin?)   Called to give the above message, no answer, left message with call back number.

## 2022-06-17 NOTE — Progress Notes (Deleted)
Patient ID: Haley George, female   DOB: 02/16/1956, 67 y.o.   MRN: [id number]   HPI: Haley George is a 67 y.o.-year-old female, initially referred by her PCP, Dr. Redmond School, returning for follow-up for DM2, dx initially in 1997 2/2 liver failure, then GDM, then re-occuring in 2007, insulin-dependent, uncontrolled, with long-term complications (CAD-s/p NSTEMI in 11/2019, s/p stents, aortic valve stenosis; CKD).  Last visit 1.5 mo ago.  Interim history: No increased urination, blurry vision, nausea, chest pain.  Reviewed HbA1c levels: Lab Results  Component Value Date   HGBA1C 9.1 (A) 04/28/2022   HGBA1C 7.9 (A) 12/14/2021   HGBA1C 6.9 (A) 08/12/2021   HGBA1C 7.0 (A) 01/28/2021   HGBA1C 6.3 (A) 12/08/2020   HGBA1C 7.4 (H) 08/20/2020   HGBA1C 7.0 (A) 07/28/2020   HGBA1C 6.2 (A) 01/28/2020   HGBA1C 6.6 (H) 12/02/2019   HGBA1C 6.4 (H) 10/22/2019  05/01/2019: HbA1c 6.2% She was on steroid injections in 2019.  Pt is on a regimen of: - Metformin 1000 mg 2x a day, with meals - Glipizide 2.5-5 mg before a meal - added 01/2019 - before a large meal >> only sporadically >> recommended Novolog 8-12 units 15 min before meals - Levemir 140 (70 units x2) units at bedtime >> 70 units >> ...45 >> Lantus 50 units at bedtime   - Ozempic 0.5 mg weekly-added 09/2018 >> 1 mg weekly >> 0.75 mg weekly due to gastric discomfort/nausea >> 1 mg weekly >> off x 3 mo >> restarted She was on Actos before. She did not start [place].  Pt checks her sugars seldom: - am: 94-126 >> 89, 102-131 >> 110-130 >> 154-239 - 2h after b'fast:  161, 249 (eating out) >> n/c >> 192-238 - before lunch:  n/c >> 129-190 >> n/c >> 313 - 2h after lunch: 141-169 >> 133-209 >> n/c >> 209-349 - before dinner: 132 >> 131-169 >> n/c >> 302 - 2h after dinner: 153-202 >> n/c >> 309-582 - bedtime: 141-189 >> 140-171 >> <200 >> 197-498 - nighttime: n/c Lowest sugar was 40 - 2012 >> ...  89 >> 90s; she has hypoglycemia awareness in the  70s. Highest sugar was 368 ... >> 200s >> 582.  Glucometer: ReliOn  Pt's meals are: - Breakfast: if not skipping - egg + whole wheat toast; cheerios + milk - Lunch: chicken salad, apple, veggies, trisquits  - Dinner: meat + 2 veggies +/- rolls  In the past, she was on [place] for autoimmune hepatitis.  She could not tolerate it and had to stop.  She lost 24 pounds on it.  -+ CKD, last BUN/creatinine:  Lab Results  Component Value Date   BUN 21 06/16/2022   BUN 23 04/21/2022   CREATININE 1.27 (H) 06/16/2022   CREATININE 1.34 (H) 04/21/2022  On Diovan 160.  -+ HL; last set of lipids: Lab Results  Component Value Date   CHOL 146 06/16/2022   HDL 26 (L) 06/16/2022   LDLCALC 68 06/16/2022   LDLDIRECT 93 11/15/2012   TRIG 324 (H) 06/16/2022   CHOLHDL 5.6 (H) 06/16/2022  On Crestor 20, fenofibrate, Vascepa.  Also on Repatha.  - last eye exam was in 03/2022: No DR reportedly. Dr. Katy Fitch.   - no numbness and tingling in her feet.  Last foot exam 07/2021.  Pt has FH of DM in M, B, M uncle, MGM.  She was admitted for chest pain 10/2018 after an episode of dehydration and had heart catheterization at that time >>  had another stent and the previous stent was recanalized. She has a history of autoimmune hepatitis. Also, psoriasis, diagnosed as a child. She lost a signif. amount of weight on [place] in 2018.   She has a history of vertigo, improved on meclizine. She had Covid in 02/2021 >> could not eat and had GI issues for 3 weeks afterward. She had palpitations after Covid >>  improved. She saw Dr. Sallyanne Kuster.  She has a h/o yeast infections.  ROS: + see HPI  I reviewed pt's medications, allergies, PMH, social hx, family hx, and changes were documented in the history of present illness. Otherwise, unchanged from my initial visit note.  Past Medical History:  Diagnosis Date   Asthma    Cancer ([place])    basal cell chest   Complication of anesthesia    Coronary artery disease     Diabetes mellitus without complication ([place])    type 2   Heart murmur    Hepatitis    auto- immune   History of kidney stones    History of nuclear stress test 05/21/2010   dipyridamole; normal perfusion, preserved LV systolic EF of [id number]   Hyperlipidemia    Hypertension    Liver failure ([place])    h/o autoimmune hepatitis    Myocardial infarction ([place])    Neuropathy    resolved with back surgery; tingling and burning left leg to foot   Obesity    PONV (postoperative nausea and vomiting)    Psoriasis    Tubular adenoma of colon    colon path 01/09/2017   Past Surgical History:  Procedure Laterality Date   Carotid Doppler  10/2008   R & L ICAs 0-49% diameter reduction    COLONOSCOPY     CORONARY ANGIOPLASTY WITH STENT PLACEMENT  09/2008   2.25x11m Cypher DES to in-stent restenosis of prox LAD; Mini-Vision stent x2 2.0x112mto area distal of initial LAD stent; 3 2.5x1529maxus stents prox to Cypher stent in circumflex   CORONARY ANGIOPLASTY WITH STENT PLACEMENT  09/2005   Cypher DES 3.0x22m73m distal RCA; 2.25x12mm43mi0Vision stent to mid LAD; 2.5x18mm 43mer stent to circumflex   CORONARY STENT INTERVENTION N/A 11/13/2018   Procedure: CORONARY STENT INTERVENTION;  Surgeon: VaranaJettie Booze [facility];  Service: Cardiovascular;  Laterality: N/A;   CORONARY STENT INTERVENTION N/A 12/02/2019   Procedure: CORONARY STENT INTERVENTION;  Surgeon: End, CNelva Bush [facility];  Service: Cardiovascular;  Laterality: N/A;   LEFT HEART CATH AND CORONARY ANGIOGRAPHY N/A 11/13/2018   Procedure: LEFT HEART CATH AND CORONARY ANGIOGRAPHY;  Surgeon: VaranaJettie Booze [facility];  Service: Cardiovascular;  Laterality: N/A;   LEFT HEART CATH AND CORONARY ANGIOGRAPHY N/A 12/02/2019   Procedure: LEFT HEART CATH AND CORONARY ANGIOGRAPHY;  Surgeon: End, CNelva Bush [facility];  Service: Cardiovascular;   Laterality: N/A;   LUMBAR LAMINECTOMY/DECOMPRESSION MICRODISCECTOMY Left 10/18/2016   Procedure: Left Lumbar Four-Five Microdiscectomy;  Surgeon: Stern,Erline Levine [facility] [place];  Laterality: Left;  Left L4-5 Microdiscectomy   ROTATOR CUFF REPAIR Left    SHOULDER ARTHROSCOPY W/ ROTATOR CUFF REPAIR Right    TRANSTHORACIC ECHOCARDIOGRAM  04/2010   EF=>55%, borderline conc LVH; trace MR & TR; AV mildly sclerotic; aortic root sclerosis/calcif   TRIGGER FINGER RELEASE Left 02/19/2020   Procedure: RELEASE TRIGGER FINGER/A-1 PULLEY;  Surgeon: CaffreEarlie Server Location: [facility];[place]  Service: Orthopedics;  Laterality: Left;   ureteral stents     Social History   Socioeconomic History   Marital status: Single    Spouse name: Not on file   Number of children: 1   Years of education: Not on file   Highest education level: Not on file  Occupational History    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  Social Needs   Financial resource strain: Not on file   Food insecurity    Worry: Not on file    Inability: Not on file   Transportation needs    Medical: Not on file    Non-medical: Not on file  Tobacco Use   Smoking status: Never Smoker   Smokeless tobacco: Never Used  Substance and Sexual Activity   Alcohol use: No   Drug use: No   Sexual activity: Not Currently  Lifestyle   Physical activity    Days per week: Not on file    Minutes per session: Not on file   Stress: Not on file  Relationships   Social connections    Talks on phone: Not on file    Gets together: Not on file    Attends religious service: Not on file    Active member of club or organization: Not on file    Attends meetings of clubs or organizations: Not on file    Relationship status: Not on file   Intimate partner violence    Fear of current or ex partner: Not on file    Emotionally abused: Not on file    Physically abused: Not on file    Forced  sexual activity: Not on file  Other Topics Concern   Not on file  Social History Narrative   Not on file   Current Outpatient Medications on File Prior to Visit  Medication Sig Dispense Refill   bisoprolol (ZEBETA) 10 MG tablet TAKE 1 TABLET BY MOUTH EVERY DAY 90 tablet 3   clopidogrel (PLAVIX) 75 MG tablet TAKE 1 TABLET BY MOUTH EVERY DAY WITH BREAKFAST 90 tablet 3   fenofibrate (TRICOR) 145 MG tablet TAKE 1 TABLET (145 MG TOTAL) BY MOUTH EVERY EVENING 90 tablet 3   aspirin EC 81 MG tablet Take 1 tablet (81 mg total) by mouth daily.     bisoprolol (ZEBETA) 5 MG tablet TAKE 5 MG IN THE MORNING AND 10 MG (TWO TABLETS) IN THE EVENING. 270 tablet 2   Blood Glucose Monitoring Suppl (ONETOUCH VERIO) w/Device KIT Use to check blood sugar 2 times a day (Patient taking differently: 1 each by Other route See admin instructions. Use to check blood sugar 2 times a day) 1 kit 0   calcipotriene (DOVONOX) 0.005 % ointment Apply topically daily in the afternoon.     clarithromycin (BIAXIN) 500 MG tablet Take 1 tablet (500 mg total) by mouth 2 (two) times daily. 20 tablet 0   Coenzyme Q10 300 MG CAPS Take 300 mg by mouth daily.     Continuous Blood Gluc Sensor (FREESTYLE LIBRE 3 SENSOR) MISC 1 each by Does not apply route every 14 (fourteen) days. 6 each 3   Cranberry 300 MG tablet Take 300 mg by mouth every evening.     Evolocumab (REPATHA SURECLICK) [id number] MG/ML SOAJ Inject 140 mg into the skin every 14 (fourteen) days. 6 mL 3   ferrous sulfate 324 (65 Fe) MG TBEC Take 1 tablet (324 mg total) by mouth in the morning and at bedtime. 180 tablet  3   fluticasone furoate-vilanterol (BREO ELLIPTA) 200-25 MCG/ACT AEPB Inhale 1 puff into the lungs daily. 3 each 3   furosemide (LASIX) 20 MG tablet Take 20 mg on days weight is 185 or greater 30 tablet 2   Glucosamine-Chondroit-Vit C-Mn (GLUCOSAMINE CHONDR 1500 COMPLX PO) Take 1 tablet by mouth daily.     glucose blood (ONETOUCH VERIO) test strip Use to check blood  sugar 2 times a day 200 each 12   insulin aspart (NOVOLOG FLEXPEN) 100 UNIT/ML FlexPen Inject 8-12 Units into the skin 3 (three) times daily before meals. 30 mL 3   insulin glargine (LANTUS SOLOSTAR) 100 UNIT/ML Solostar Pen Inject 50 Units into the skin daily. 45 mL 1   Insulin Pen Needle (BD PEN NEEDLE NANO 2ND GEN) 32G X 4 MM MISC USE AS DIRECTED FOR INSULIN ADMINISTRATION 300 each 3   isosorbide mononitrate (IMDUR) 60 MG 24 hr tablet TAKE 1 TABLET (60 MG TOTAL) BY MOUTH DAILY. ** DO NOT CRUSH ** 90 tablet 1   Magnesium Oxide 400 MG CAPS Take 1 capsule (400 mg total) by mouth in the morning and at bedtime. 180 capsule 3   meclizine (ANTIVERT) 25 MG tablet Take 1 tablet (25 mg total) by mouth 3 (three) times daily as needed for dizziness. (Patient not taking: Reported on 05/04/2022) 30 tablet 1   metFORMIN (GLUCOPHAGE) 1000 MG tablet TAKE 1 TABLET (1,000 MG TOTAL) BY MOUTH TWICE A DAY WITH FOOD 180 tablet 3   Multiple Vitamins-Minerals (CENTRUM SILVER 50+WOMEN PO) Take 1 tablet by mouth daily.     nitroGLYCERIN (NITROSTAT) 0.4 MG SL tablet For chest pain, tightness, or pressure. While sitting, place 1 tablet under tongue. May be used every 5 minutes as needed, for up to 15 minutes. Do not use more than 3 tablets. (Patient not taking: Reported on 05/05/2022) 25 tablet 3   nystatin cream (MYCOSTATIN) Apply topically as needed.     ReliOn Lancets Thin 26G MISC 1 Units by Does not apply route 2 (two) times daily. E 11.9 100 each 12   rosuvastatin (CRESTOR) 20 MG tablet TAKE 1 TABLET BY MOUTH EVERY DAY 90 tablet 3   Semaglutide, 1 MG/DOSE, (OZEMPIC, 1 MG/DOSE,) 4 MG/3ML SOPN Inject 1 mg into the skin once a week. 9 mL 3   valsartan (DIOVAN) 160 MG tablet TAKE 1 TABLET BY MOUTH EVERY DAY 90 tablet 0   VASCEPA 1 g capsule TAKE 2 CAPSULES BY MOUTH TWICE A DAY 360 capsule 0   No current facility-administered medications on file prior to visit.   Allergies  Allergen Reactions   Penicillins Anaphylaxis and  Hives    PATIENT HAD A PCN REACTION WITH IMMEDIATE RASH, FACIAL/TONGUE/THROAT SWELLING, SOB, OR LIGHTHEADEDNESS WITH HYPOTENSION:  #  #  #  YES  #  #  #    Has patient had a PCN reaction causing severe rash involving mucus membranes or skin necrosis:Unknown  Has patient had a PCN reaction that required hospitalization:No  Has patient had a PCN reaction occurring within the last 10 years:No  If all of the above answers are "NO", then may proceed with Cephalosporin use.  PATIENT HAD A PCN REACTION WITH IMMEDIATE RASH, FACIAL/TONGUE/THROAT SWELLING, SOB, OR LIGHTHEADEDNESS WITH HYPOTENSION:  #  #  #  YES  #  #  #   Has patient had a PCN reaction causing severe rash involving mucus membranes or skin necrosis:UnknownHas patient had a PCN reaction that required hospitalization:No Has patient had a PCN reaction  occurring within the last 10 years:No If all of the above answers are "NO", then may proceed with Cephalosporin use.   Coconut Flavor Swelling    Mouth breaks out    Cocos Nucifera     Breaks out mouth   Other Swelling    Walnuts cause mouth to break out   Family History  Problem Relation Age of Onset   CAD Mother    CAD Father    Diabetes Brother    PE: There were no vitals taken for this visit. Wt Readings from Last 3 Encounters:  05/05/22 184 lb (83.5 kg)  05/04/22 183 lb 12.8 oz (83.4 kg)  04/28/22 185 lb 9.6 oz (84.2 kg)   Constitutional: overweight, in NAD Eyes: EOMI, no exophthalmos ENT: no thyromegaly, no cervical lymphadenopathy Cardiovascular: RRR, No RG, +2/6 SEM Respiratory: CTA B Musculoskeletal: no deformities Skin: no rashes Neurological: no tremor with outstretched hands  ASSESSMENT: 1. DM2, insulin-dependent, uncontrolled, with long-term complications - CAD-s/p AMI, s/p stents, aortic valve stenosis - CKD  2. HL  3.  Obesity class I  PLAN:  1. Patient with longstanding, uncontrolled, DM2, with worsening control.  She is on metformin and basal-bolus  insulin regimen, with NovoLog added at last visit.  At that time, she was not able to start [place] and she was off Ozempic x3 mo due to [employer].  Sugars were much higher, even up to higher 500s.  They were very high throughout the day and improving overnight.  HbA1c was 9.1%, much higher.  I advised her to stop glipizide and start NovoLog and also try to restart Ozempic.  I printed her a prescription to see if she can get it from another pharmacy.  I also recommended a CGM and sent a prescription for the freestyle libre 3 sensors to her pharmacy.  I also strongly recommended to stop cranberry juice or switch to the diet version.  - I suggested to:  Patient Instructions  Please continue: - Metformin 1000 mg 2x a day with meals - Levemir/Lantus 50 units at bedtime - Novolog 8-12 units 15 min before meals - Ozempic 1 mg weekly  Please come back for a follow-up appointment in 3 months.  - we checked her HbA1c: 7%  - advised to check sugars at different times of the day - 4x a day, rotating check times - advised for yearly eye exams >> she is UTD - return to clinic in 3 months  2. HL -Reviewed yesterday's lipid panel: LDL still above our goal of less than 55 due to cardiovascular disease, triglycerides very high, HDL low: Lab Results  Component Value Date   CHOL 146 06/16/2022   HDL 26 (L) 06/16/2022   LDLCALC 68 06/16/2022   LDLDIRECT 93 11/15/2012   TRIG 324 (H) 06/16/2022   CHOLHDL 5.6 (H) 06/16/2022  -On Crestor 10 mg daily, fenofibrate 145 mg daily, Vascepa 2 g twice a day, Repatha.  No SEs.  3.  Obesity class I -She was not able to start an SGLT2 inhibitor, but at last visit we tried to add a GLP-1 receptor agonist -She gained 3 pounds before last visit despite glucotoxicity   [name], MD PhD [facility]

## 2022-06-20 ENCOUNTER — Other Ambulatory Visit: Payer: Self-pay | Admitting: Emergency Medicine

## 2022-06-20 ENCOUNTER — Other Ambulatory Visit: Payer: Self-pay

## 2022-06-20 DIAGNOSIS — D500 Iron deficiency anemia secondary to blood loss (chronic): Secondary | ICD-10-CM | POA: Insufficient documentation

## 2022-06-21 ENCOUNTER — Telehealth: Payer: Self-pay | Admitting: Pharmacy Technician

## 2022-06-21 NOTE — Telephone Encounter (Signed)
Auth Submission: NO AUTH NEEDED Payer: BCBS [place] Medication & CPT/J Code(s) submitted: Feraheme (ferumoxytol) [id number] Route of submission (phone, fax, portal):  Phone # Fax # Auth type: Buy/Bill Units/visits requested: 2 Reference number: [id number] [id number] Approval from: 06/21/22 to 11/19/22

## 2022-06-24 ENCOUNTER — Telehealth: Payer: Self-pay

## 2022-06-24 NOTE — Telephone Encounter (Signed)
Selecting a medicare provider and wants to confirm Dr Cruzita Lederer and [facility] accepts Medicare advantage program for [employer].

## 2022-07-01 ENCOUNTER — Ambulatory Visit (INDEPENDENT_AMBULATORY_CARE_PROVIDER_SITE_OTHER): Payer: BC Managed Care – PPO

## 2022-07-01 VITALS — BP 136/72 | HR 90 | Temp 98.8°F | Resp 16 | Ht 64.5 in | Wt 180.7 lb

## 2022-07-01 DIAGNOSIS — D509 Iron deficiency anemia, unspecified: Secondary | ICD-10-CM | POA: Diagnosis not present

## 2022-07-01 DIAGNOSIS — D500 Iron deficiency anemia secondary to blood loss (chronic): Secondary | ICD-10-CM

## 2022-07-01 MED ORDER — DIPHENHYDRAMINE HCL 25 MG PO CAPS
25.0000 mg | ORAL_CAPSULE | Freq: Once | ORAL | Status: AC
  Administered 2022-07-01: 25 mg via ORAL
  Filled 2022-07-01: qty 1

## 2022-07-01 MED ORDER — SODIUM CHLORIDE 0.9 % IV SOLN
510.0000 mg | Freq: Once | INTRAVENOUS | Status: AC
  Administered 2022-07-01: 510 mg via INTRAVENOUS
  Filled 2022-07-01: qty 17

## 2022-07-01 MED ORDER — ACETAMINOPHEN 325 MG PO TABS
650.0000 mg | ORAL_TABLET | Freq: Once | ORAL | Status: AC
  Administered 2022-07-01: 650 mg via ORAL
  Filled 2022-07-01: qty 2

## 2022-07-01 NOTE — Progress Notes (Signed)
Diagnosis: Iron Deficiency Anemia  Provider:  Marshell Garfinkel MD  Procedure: Infusion  IV Type: Peripheral, IV Location: L Antecubital  Feraheme (Ferumoxytol), Dose: 510 mg  Infusion Start Time: [id number]  Infusion Stop Time: [id number]  Post Infusion IV Care: Observation period completed and Peripheral IV Discontinued  Discharge: Condition: Stable, Destination: Home . AVS Provided  Performed by:  Binnie Kand, RN

## 2022-07-07 ENCOUNTER — Telehealth: Payer: Self-pay

## 2022-07-07 NOTE — Telephone Encounter (Signed)
Patient advised and verbalized understanding 

## 2022-07-07 NOTE — Telephone Encounter (Signed)
Pt called advising she completed her isolation period from a previous covid exposure on 06/27/22. She was exposed to someone with covid again yesterday around 3:00. She spent an hour with this person and hugged them. The person contacted her last night around 11 saying they were diagnosed with covid. She wants to know if there's anything she can do/take to limit her reaction to the exposure.

## 2022-07-08 ENCOUNTER — Ambulatory Visit: Payer: BC Managed Care – PPO

## 2022-07-13 ENCOUNTER — Ambulatory Visit (INDEPENDENT_AMBULATORY_CARE_PROVIDER_SITE_OTHER): Payer: BC Managed Care – PPO

## 2022-07-13 VITALS — BP 132/69 | HR 91 | Temp 98.9°F | Resp 20 | Ht 64.5 in | Wt 179.4 lb

## 2022-07-13 DIAGNOSIS — D631 Anemia in chronic kidney disease: Secondary | ICD-10-CM

## 2022-07-13 DIAGNOSIS — N1831 Chronic kidney disease, stage 3a: Secondary | ICD-10-CM | POA: Diagnosis not present

## 2022-07-13 DIAGNOSIS — D500 Iron deficiency anemia secondary to blood loss (chronic): Secondary | ICD-10-CM

## 2022-07-13 MED ORDER — DIPHENHYDRAMINE HCL 25 MG PO CAPS
25.0000 mg | ORAL_CAPSULE | Freq: Once | ORAL | Status: AC
  Administered 2022-07-13: 25 mg via ORAL
  Filled 2022-07-13: qty 1

## 2022-07-13 MED ORDER — SODIUM CHLORIDE 0.9 % IV SOLN
510.0000 mg | Freq: Once | INTRAVENOUS | Status: AC
  Administered 2022-07-13: 510 mg via INTRAVENOUS
  Filled 2022-07-13: qty 17

## 2022-07-13 MED ORDER — ACETAMINOPHEN 325 MG PO TABS
650.0000 mg | ORAL_TABLET | Freq: Once | ORAL | Status: AC
  Administered 2022-07-13: 650 mg via ORAL
  Filled 2022-07-13: qty 2

## 2022-07-13 NOTE — Progress Notes (Signed)
Diagnosis: Iron Deficiency Anemia  Provider:  Marshell Garfinkel MD  Procedure: Infusion  IV Type: Peripheral, IV Location: L Antecubital  Feraheme (Ferumoxytol), Dose: 510 mg  Infusion Start Time: [id number]  Infusion Stop Time: [id number]  Post Infusion IV Care: Peripheral IV Discontinued  Discharge: Condition: Good, Destination: Home . AVS Provided  Performed by:  Arnoldo Morale, RN

## 2022-07-18 ENCOUNTER — Ambulatory Visit: Payer: BC Managed Care – PPO

## 2022-07-18 ENCOUNTER — Other Ambulatory Visit: Payer: Self-pay

## 2022-07-21 ENCOUNTER — Ambulatory Visit: Payer: BC Managed Care – PPO | Admitting: Internal Medicine

## 2022-07-21 ENCOUNTER — Encounter: Payer: Self-pay | Admitting: Internal Medicine

## 2022-07-21 VITALS — BP 120/72 | HR 97 | Ht 64.5 in | Wt 178.0 lb

## 2022-07-21 DIAGNOSIS — E1169 Type 2 diabetes mellitus with other specified complication: Secondary | ICD-10-CM

## 2022-07-21 DIAGNOSIS — E1159 Type 2 diabetes mellitus with other circulatory complications: Secondary | ICD-10-CM | POA: Diagnosis not present

## 2022-07-21 DIAGNOSIS — E785 Hyperlipidemia, unspecified: Secondary | ICD-10-CM

## 2022-07-21 DIAGNOSIS — Z794 Long term (current) use of insulin: Secondary | ICD-10-CM

## 2022-07-21 DIAGNOSIS — Z6834 Body mass index (BMI) 34.0-34.9, adult: Secondary | ICD-10-CM

## 2022-07-21 DIAGNOSIS — E6609 Other obesity due to excess calories: Secondary | ICD-10-CM

## 2022-07-21 DIAGNOSIS — E66811 Obesity, class 1: Secondary | ICD-10-CM

## 2022-07-21 LAB — POCT GLYCOSYLATED HEMOGLOBIN (HGB A1C): Hemoglobin A1C: 6.2 % — AB (ref 4.0–5.6)

## 2022-07-21 NOTE — Progress Notes (Signed)
Patient ID: Haley George, female   DOB: July 09, 1955, 67 y.o.   MRN: [id number]   HPI: Haley George is a 67 y.o.-year-old female, initially referred by her PCP, Dr. Redmond School, returning for follow-up for DM2, dx initially in 1997 2/2 liver failure, then GDM, then re-occuring in 2007, insulin-dependent, uncontrolled, with long-term complications (CAD-s/p NSTEMI in 11/2019, s/p stents, aortic valve stenosis; CKD).  Last visit 4 mo ago.  Interim history: No increased urination, blurry vision, nausea, chest pain. At last visit, she was able to start mealtime insulin and also Ozempic.  Sugars improved significantly. She got 2 iron infusions recently.  Reviewed HbA1c levels: Lab Results  Component Value Date   HGBA1C 9.1 (A) 04/28/2022   HGBA1C 7.9 (A) 12/14/2021   HGBA1C 6.9 (A) 08/12/2021   HGBA1C 7.0 (A) 01/28/2021   HGBA1C 6.3 (A) 12/08/2020   HGBA1C 7.4 (H) 08/20/2020   HGBA1C 7.0 (A) 07/28/2020   HGBA1C 6.2 (A) 01/28/2020   HGBA1C 6.6 (H) 12/02/2019   HGBA1C 6.4 (H) 10/22/2019  05/01/2019: HbA1c 6.2% She was on steroid injections in 2019.  Pt is on a regimen of: - Metformin 1000 mg 2x a day, with meals - Levemir 140 (70 units x2) units at bedtime >> 70 units >> ...45 >> Lantus 50 units at bedtime   - Ozempic 0.5 mg weekly-added 09/2018 >> 1 mg weekly >> 0.75 mg weekly due to gastric discomfort/nausea >> 1 mg weekly >> off >> restarted 1 mg weekly - Novolog 8-12 units 15 min before meals - started 04/2022 She was on Actos before. She did not start [place]. In 04/2022, we stopped glipizide and started NovoLog.  Pt checks her sugars seldom: - am:  94-126 >> 89, 102-131 >> 110-130 >> 154-239 >> 95-139, 143 - 2h after b'fast: 161, 249 (eating out) >> n/c >> 192-238 >> n/c - before lunch: 139, 146 >> n/c >> 129-190 >> n/c >> 313 >> 108-131 - 2h after lunch: n/c >> 141-169 >> 133-209 >> n/c >> 209-349 >> 128-160 - before dinner: 131-162 >> 132 >> 131-169 >> n/c >> 302 >> 110-119 - 2h  after dinner: 153-202 >> n/c >> 309-582 >> 147-220, 308 (forgot insulin) - bedtime: 141-189 >> 140-171 >> <200 >> 197-498 >> 132, 140 - nighttime: n/c Lowest sugar was 40 - 2012 >> ...  89 >> 90s >> 95; she has hypoglycemia awareness in the 70s. Highest sugar was 200s >> 582 >> 308.  Glucometer: ReliOn  Pt's meals are: - Breakfast: if not skipping - egg + whole wheat toast; cheerios + milk - Lunch: chicken salad, apple, veggies, trisquits  - Dinner: meat + 2 veggies +/- rolls  In the past, she was on [place] for autoimmune hepatitis.  She could not tolerate it and had to stop.  She lost 24 pounds on it.  -+ CKD, last BUN/creatinine:  Lab Results  Component Value Date   BUN 21 06/16/2022   BUN 23 04/21/2022   CREATININE 1.27 (H) 06/16/2022   CREATININE 1.34 (H) 04/21/2022  On [place] 160.  -+ HL; last set of lipids: Lab Results  Component Value Date   CHOL 146 06/16/2022   HDL 26 (L) 06/16/2022   LDLCALC 68 06/16/2022   LDLDIRECT 93 11/15/2012   TRIG 324 (H) 06/16/2022   CHOLHDL 5.6 (H) 06/16/2022  On Crestor 20, fenofibrate, Vascepa.  Also on Repatha.  - last eye exam was in 03/2022: No DR reportedly. Dr. Katy Fitch.   - no numbness and tingling  in her feet.  Last foot exam 08/12/2021.  Pt has FH of DM in M, B, M uncle, MGM.  She was admitted for chest pain 10/2018 after an episode of dehydration and had heart catheterization at that time >> had another stent and the previous stent was recanalized. She has a history of autoimmune hepatitis. Also, psoriasis, diagnosed as a child. She lost a signif. amount of weight on [place] in 2018.   She has a history of vertigo, improved on meclizine. She had Covid in 02/2021 >> could not eat and had GI issues for 3 weeks afterward. She had palpitations after Covid >>  improved. She sees Dr. Sallyanne Kuster.  She has a h/o yeast infections.  ROS: + see HPI  I reviewed pt's medications, allergies, PMH, social hx, family hx, and changes were  documented in the history of present illness. Otherwise, unchanged from my initial visit note.  Past Medical History:  Diagnosis Date   Asthma    Cancer ([place])    basal cell chest   Complication of anesthesia    Coronary artery disease    Diabetes mellitus without complication ([place])    type 2   Heart murmur    Hepatitis    auto- immune   History of kidney stones    History of nuclear stress test 05/21/2010   dipyridamole; normal perfusion, preserved LV systolic EF of [id number]   Hyperlipidemia    Hypertension    Liver failure ([place])    h/o autoimmune hepatitis    Myocardial infarction ([place])    Neuropathy    resolved with back surgery; tingling and burning left leg to foot   Obesity    PONV (postoperative nausea and vomiting)    Psoriasis    Tubular adenoma of colon    colon path 01/09/2017   Past Surgical History:  Procedure Laterality Date   Carotid Doppler  10/2008   R & L ICAs 0-49% diameter reduction    COLONOSCOPY     CORONARY ANGIOPLASTY WITH STENT PLACEMENT  09/2008   2.25x16mm Cypher DES to in-stent restenosis of prox LAD; Mini-Vision stent x2 2.0x48mm to area distal of initial LAD stent; 3 2.5x5mm Taxus stents prox to Cypher stent in circumflex   CORONARY ANGIOPLASTY WITH STENT PLACEMENT  09/2005   Cypher DES 3.0x17mm to distal RCA; 2.25x50mm Mini0Vision stent to mid LAD; 2.5x8mm Cypher stent to circumflex   CORONARY STENT INTERVENTION N/A 11/13/2018   Procedure: CORONARY STENT INTERVENTION;  Surgeon: Jettie Booze, MD;  Location: [facility];  Service: Cardiovascular;  Laterality: N/A;   CORONARY STENT INTERVENTION N/A 12/02/2019   Procedure: CORONARY STENT INTERVENTION;  Surgeon: Nelva Bush, MD;  Location: [facility];  Service: Cardiovascular;  Laterality: N/A;   LEFT HEART CATH AND CORONARY ANGIOGRAPHY N/A 11/13/2018   Procedure: LEFT HEART CATH AND CORONARY ANGIOGRAPHY;  Surgeon: Jettie Booze, MD;  Location: [facility];  Service:  Cardiovascular;  Laterality: N/A;   LEFT HEART CATH AND CORONARY ANGIOGRAPHY N/A 12/02/2019   Procedure: LEFT HEART CATH AND CORONARY ANGIOGRAPHY;  Surgeon: Nelva Bush, MD;  Location: [facility];  Service: Cardiovascular;  Laterality: N/A;   LUMBAR LAMINECTOMY/DECOMPRESSION MICRODISCECTOMY Left 10/18/2016   Procedure: Left Lumbar Four-Five Microdiscectomy;  Surgeon: Erline Levine, MD;  Location: [facility];  Service: Neurosurgery;  Laterality: Left;  Left L4-5 Microdiscectomy   ROTATOR CUFF REPAIR Left    SHOULDER ARTHROSCOPY W/ ROTATOR CUFF REPAIR Right    TRANSTHORACIC ECHOCARDIOGRAM  04/2010   EF=>55%, borderline conc LVH; trace MR & TR; AV mildly sclerotic; aortic root sclerosis/calcif   TRIGGER FINGER RELEASE Left 02/19/2020   Procedure: RELEASE TRIGGER FINGER/A-1 PULLEY;  Surgeon: Earlie Server, MD;  Location: [facility];  Service: Orthopedics;  Laterality: Left;   ureteral stents     Social History   Socioeconomic History   Marital status: Single    Spouse name: Not on file   Number of children: 1   Years of education: Not on file   Highest education level: Not on file  Occupational History    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  Social Needs   Financial resource strain: Not on file   Food insecurity    Worry: Not on file    Inability: Not on file   Transportation needs    Medical: Not on file    Non-medical: Not on file  Tobacco Use   Smoking status: Never Smoker   Smokeless tobacco: Never Used  Substance and Sexual Activity   Alcohol use: No   Drug use: No   Sexual activity: Not Currently  Lifestyle   Physical activity    Days per week: Not on file    Minutes per session: Not on file   Stress: Not on file  Relationships   Social connections    Talks on phone: Not on file    Gets together: Not on file    Attends religious service: Not on file    Active member of club or organization: Not on file    Attends  meetings of clubs or organizations: Not on file    Relationship status: Not on file   Intimate partner violence    Fear of current or ex partner: Not on file    Emotionally abused: Not on file    Physically abused: Not on file    Forced sexual activity: Not on file  Other Topics Concern   Not on file  Social History Narrative   Not on file   Current Outpatient Medications on File Prior to Visit  Medication Sig Dispense Refill   aspirin EC 81 MG tablet Take 1 tablet (81 mg total) by mouth daily.     bisoprolol (ZEBETA) 10 MG tablet TAKE 1 TABLET BY MOUTH EVERY DAY 90 tablet 3   bisoprolol (ZEBETA) 5 MG tablet TAKE 5 MG IN THE MORNING AND 10 MG (TWO TABLETS) IN THE EVENING. 270 tablet 2   Blood Glucose Monitoring Suppl (ONETOUCH VERIO) w/Device KIT Use to check blood sugar 2 times a day (Patient taking differently: 1 each by Other route See admin instructions. Use to check blood sugar 2 times a day) 1 kit 0   calcipotriene (DOVONOX) 0.005 % ointment Apply topically daily in the afternoon.     clarithromycin (BIAXIN) 500 MG tablet Take 1 tablet (500 mg total) by mouth 2 (two) times daily. 20 tablet 0   clopidogrel (PLAVIX) 75 MG tablet TAKE 1 TABLET BY MOUTH EVERY DAY WITH BREAKFAST 90 tablet 3   Coenzyme Q10 300 MG CAPS Take 300 mg by mouth daily.     Continuous Blood Gluc Sensor (FREESTYLE LIBRE 3 SENSOR) MISC 1 each by Does not apply route every 14 (fourteen) days. 6 each 3   Cranberry 300 MG tablet Take 300 mg by mouth every evening.     Evolocumab (REPATHA SURECLICK) XX123456 MG/ML SOAJ Inject 140 mg into the skin every 14 (fourteen) days. 6 mL 3   fenofibrate (TRICOR)  145 MG tablet TAKE 1 TABLET (145 MG TOTAL) BY MOUTH EVERY EVENING 90 tablet 3   ferrous sulfate 324 (65 Fe) MG TBEC Take 1 tablet (324 mg total) by mouth in the morning and at bedtime. 180 tablet 3   fluticasone furoate-vilanterol (BREO ELLIPTA) 200-25 MCG/ACT AEPB Inhale 1 puff into the lungs daily. 3 each 3   furosemide  (LASIX) 20 MG tablet Take 20 mg on days weight is 185 or greater 30 tablet 2   Glucosamine-Chondroit-Vit C-Mn (GLUCOSAMINE CHONDR 1500 COMPLX PO) Take 1 tablet by mouth daily.     glucose blood (ONETOUCH VERIO) test strip Use to check blood sugar 2 times a day 200 each 12   insulin aspart (NOVOLOG FLEXPEN) 100 UNIT/ML FlexPen Inject 8-12 Units into the skin 3 (three) times daily before meals. 30 mL 3   insulin glargine (LANTUS SOLOSTAR) 100 UNIT/ML Solostar Pen Inject 50 Units into the skin daily. 45 mL 1   Insulin Pen Needle (BD PEN NEEDLE NANO 2ND GEN) 32G X 4 MM MISC USE AS DIRECTED FOR INSULIN ADMINISTRATION 300 each 3   isosorbide mononitrate (IMDUR) 60 MG 24 hr tablet TAKE 1 TABLET (60 MG TOTAL) BY MOUTH DAILY. ** DO NOT CRUSH ** 90 tablet 1   Magnesium Oxide 400 MG CAPS Take 1 capsule (400 mg total) by mouth in the morning and at bedtime. 180 capsule 3   meclizine (ANTIVERT) 25 MG tablet Take 1 tablet (25 mg total) by mouth 3 (three) times daily as needed for dizziness. (Patient not taking: Reported on 05/04/2022) 30 tablet 1   metFORMIN (GLUCOPHAGE) 1000 MG tablet TAKE 1 TABLET (1,000 MG TOTAL) BY MOUTH TWICE A DAY WITH FOOD 180 tablet 3   Multiple Vitamins-Minerals (CENTRUM SILVER 50+WOMEN PO) Take 1 tablet by mouth daily.     nitroGLYCERIN (NITROSTAT) 0.4 MG SL tablet For chest pain, tightness, or pressure. While sitting, place 1 tablet under tongue. May be used every 5 minutes as needed, for up to 15 minutes. Do not use more than 3 tablets. (Patient not taking: Reported on 05/05/2022) 25 tablet 3   nystatin cream (MYCOSTATIN) Apply topically as needed.     ReliOn Lancets Thin 26G MISC 1 Units by Does not apply route 2 (two) times daily. E 11.9 100 each 12   rosuvastatin (CRESTOR) 20 MG tablet TAKE 1 TABLET BY MOUTH EVERY DAY 90 tablet 3   Semaglutide, 1 MG/DOSE, (OZEMPIC, 1 MG/DOSE,) 4 MG/3ML SOPN Inject 1 mg into the skin once a week. 9 mL 3   valsartan (DIOVAN) 160 MG tablet TAKE 1 TABLET  BY MOUTH EVERY DAY 90 tablet 0   VASCEPA 1 g capsule TAKE 2 CAPSULES BY MOUTH TWICE A DAY 360 capsule 0   No current facility-administered medications on file prior to visit.   Allergies  Allergen Reactions   Penicillins Anaphylaxis and Hives    PATIENT HAD A PCN REACTION WITH IMMEDIATE RASH, FACIAL/TONGUE/THROAT SWELLING, SOB, OR LIGHTHEADEDNESS WITH HYPOTENSION:  #  #  #  YES  #  #  #    Has patient had a PCN reaction causing severe rash involving mucus membranes or skin necrosis:Unknown  Has patient had a PCN reaction that required hospitalization:No  Has patient had a PCN reaction occurring within the last 10 years:No  If all of the above answers are "NO", then may proceed with Cephalosporin use.  PATIENT HAD A PCN REACTION WITH IMMEDIATE RASH, FACIAL/TONGUE/THROAT SWELLING, SOB, OR LIGHTHEADEDNESS WITH HYPOTENSION:  #  #  #  YES  #  #  #   Has patient had a PCN reaction causing severe rash involving mucus membranes or skin necrosis:UnknownHas patient had a PCN reaction that required hospitalization:No Has patient had a PCN reaction occurring within the last 10 years:No If all of the above answers are "NO", then may proceed with Cephalosporin use.   Coconut Flavor Swelling    Mouth breaks out    Cocos Nucifera     Breaks out mouth   Other Swelling    Walnuts cause mouth to break out   Family History  Problem Relation Age of Onset   CAD Mother    CAD Father    Diabetes Brother    PE: BP 120/72   Pulse 97   Ht 5' 4.5" (1.638 m)   Wt 178 lb (80.7 kg)   SpO2 98%   BMI 30.08 kg/m  Wt Readings from Last 3 Encounters:  07/21/22 178 lb (80.7 kg)  07/13/22 179 lb 6.4 oz (81.4 kg)  07/01/22 180 lb 11.2 oz (82 kg)   Constitutional: overweight, in NAD Eyes: EOMI, no exophthalmos ENT: no thyromegaly, no cervical lymphadenopathy Cardiovascular: RRR, No RG, +2/6 SEM Respiratory: CTA B Musculoskeletal: no deformities Skin: no rashes Neurological: no tremor with outstretched  hands Diabetic Foot Exam - Simple   Simple Foot Form Diabetic Foot exam was performed with the following findings: Yes 07/21/2022  3:20 PM  Visual Inspection No deformities, no ulcerations, no other skin breakdown bilaterally: Yes Sensation Testing Intact to touch and monofilament testing bilaterally: Yes Pulse Check Posterior Tibialis and Dorsalis pulse intact bilaterally: Yes Comments L hallux onychodystrophy but healthy nail growing at the nailbed    ASSESSMENT: 1. DM2, insulin-dependent, uncontrolled, with long-term complications - CAD-s/p AMI, s/p stents, aortic valve stenosis - CKD  2. HL  3.  Obesity class I  PLAN:  1. Patient with longstanding, uncontrolled, type 2 diabetes, with worsening control.  At last visit, HbA1c was 9.1%, much higher.  At that time, she was off Ozempic due to Producer, television/film/video.  Also, she was not taking glipizide as she was forgetting it and did not pick up [place] from the pharmacy as recommended.  I recommended to start the CGM and sent a prescription for the freestyle libre 3 to her pharmacy.  I explained how this works.  I recommended to try to restart Ozempic, and printed her a prescription to take to another pharmacy.  I also recommended to stop glipizide and start NovoLog.  I advised her how to adjust the doses based on the size and consistency of her meals. -At today's visit, sugars appear to be dramatically improved after starting mealtime insulin and Ozempic.  She was able to obtain this from another pharmacy.  She switches to Medicare soon and it is unclear whether Ozempic would be covered afterwards. -For now, sugars appear to be mostly at goal but with still higher values after dinner.  For now, we discussed about continuing the current regimen. -She was not able to obtain a CGM due to cost; we can possibly retry at next visit, when she will have Medicare. - I suggested to:  Patient Instructions  Please continue: - Metformin 1000 mg 2x a day  with meals - Lantus 50 units at bedtime - Ozempic 1 mg weekly - Novolog 8-12 units 15 min before meals  Please come back for a follow-up appointment in 4 months.  - we checked her HbA1c: 6.2% (significant decrease) - advised to check sugars at  different times of the day - 4x a day, rotating check times - advised for yearly eye exams >> she is UTD - return to clinic in 4 months  2. HL -Reviewed the latest lipid panel from 05/2022: LDL slightly above target, triglycerides elevated, HDL low: Lab Results  Component Value Date   CHOL 146 06/16/2022   HDL 26 (L) 06/16/2022   LDLCALC 68 06/16/2022   LDLDIRECT 93 11/15/2012   TRIG 324 (H) 06/16/2022   CHOLHDL 5.6 (H) 06/16/2022  -She continues on Crestor 10 mg daily, fenofibrate 145 mg daily, Vascepa 2 g twice a day and Repatha.  No side effects.  3.  Obesity class I -She was not able to start an SGLT2 inhibitor.   -However, she was able to start Ozempic, which should also help with weight loss -She lost 7 pounds since last visit  [name], MD PhD [facility] Endocrinology

## 2022-07-21 NOTE — Patient Instructions (Addendum)
Please continue: - Metformin 1000 mg 2x a day with meals - Lantus 50 units at bedtime - Ozempic 1 mg weekly - Novolog 8-12 units 15 min before meals  Please come back for a follow-up appointment in 4 months.

## 2022-08-11 ENCOUNTER — Ambulatory Visit: Payer: BC Managed Care – PPO | Admitting: Internal Medicine

## 2022-08-16 ENCOUNTER — Telehealth: Payer: Self-pay | Admitting: Cardiovascular Disease

## 2022-08-16 DIAGNOSIS — D500 Iron deficiency anemia secondary to blood loss (chronic): Secondary | ICD-10-CM

## 2022-08-16 DIAGNOSIS — I25118 Atherosclerotic heart disease of native coronary artery with other forms of angina pectoris: Secondary | ICD-10-CM

## 2022-08-16 NOTE — Telephone Encounter (Signed)
Patient returned RN's call. 

## 2022-08-16 NOTE — Telephone Encounter (Signed)
Called pt to let her know the message has been relayed to the provider. She states she did receive a voicemail from Landa, Charity fundraiser and thanked [place] for calling her back.

## 2022-08-16 NOTE — Telephone Encounter (Signed)
Called left a message to patient can come tomorrow or Thursday for labs  non fasting    And bring letter to appointment about medication changes to dsc[facility]  Any question may cal back.

## 2022-08-16 NOTE — Addendum Note (Signed)
Addended by: Tobin Chad on: 08/16/2022 05:48 PM   Modules accepted: Orders

## 2022-08-16 NOTE — Telephone Encounter (Signed)
   Pt is calling to ask if Dr. Salena Saner will need labs before her appt on Friday. Also, she said, her insurance sent paperwork to Dr.C about changing her medication to a different tier. She wants to know if Dr. Salena Saner receive that

## 2022-08-16 NOTE — Telephone Encounter (Signed)
Would like to repeat CBC, iron studies (iron, TIBC, ferritin) and BMET. I have not seen a notification about a med change from insurance. Which medication are they referring to?

## 2022-08-16 NOTE — Telephone Encounter (Signed)
Called left a message . Dr C is not in the office , will send for his response.  Also  will send to his nurse to receive if paper work received about medication changes.   Aware will call once received a response

## 2022-08-17 ENCOUNTER — Ambulatory Visit: Payer: BC Managed Care – PPO | Admitting: Family Medicine

## 2022-08-17 ENCOUNTER — Encounter: Payer: Self-pay | Admitting: Family Medicine

## 2022-08-17 ENCOUNTER — Telehealth: Payer: Self-pay | Admitting: Family Medicine

## 2022-08-17 VITALS — BP 132/88 | HR 78 | Temp 97.6°F | Wt 178.8 lb

## 2022-08-17 DIAGNOSIS — R300 Dysuria: Secondary | ICD-10-CM | POA: Diagnosis not present

## 2022-08-17 DIAGNOSIS — N3000 Acute cystitis without hematuria: Secondary | ICD-10-CM | POA: Diagnosis not present

## 2022-08-17 DIAGNOSIS — J4530 Mild persistent asthma, uncomplicated: Secondary | ICD-10-CM

## 2022-08-17 DIAGNOSIS — Z7184 Encounter for health counseling related to travel: Secondary | ICD-10-CM

## 2022-08-17 LAB — POCT URINALYSIS DIP (PROADVANTAGE DEVICE)
Bilirubin, UA: NEGATIVE
Blood, UA: NEGATIVE
Glucose, UA: NEGATIVE mg/dL
Ketones, POC UA: NEGATIVE mg/dL
Nitrite, UA: NEGATIVE
Protein Ur, POC: NEGATIVE mg/dL
Specific Gravity, Urine: 1.02
Urobilinogen, Ur: 0.2
pH, UA: 6 (ref 5.0–8.0)

## 2022-08-17 MED ORDER — FLUTICASONE FUROATE-VILANTEROL 200-25 MCG/ACT IN AEPB
1.0000 | INHALATION_SPRAY | Freq: Every day | RESPIRATORY_TRACT | 3 refills | Status: AC
Start: 2022-08-17 — End: ?

## 2022-08-17 MED ORDER — SULFAMETHOXAZOLE-TRIMETHOPRIM 800-160 MG PO TABS
1.0000 | ORAL_TABLET | Freq: Two times a day (BID) | ORAL | 0 refills | Status: DC
Start: 2022-08-17 — End: 2023-01-16

## 2022-08-17 NOTE — Progress Notes (Signed)
   Subjective:    Patient ID: Haley George, female    DOB: 1955-07-07, 67 y.o.   MRN: [id number]  HPI She has a 5-day history of urgency, frequency and dysuria but no fever or chills.  She has had previous difficulty with this. She also has an impending trip to [place] and [place] and has questions concerning this especially in regard to her underlying medical conditions.  At the present time she is stable with her breathing as well as cardiac status.   Review of Systems     Objective:   Physical Exam Alert and in no distress.  Urine dipstick was positive.       Assessment & Plan:  Acute cystitis without hematuria - Plan: sulfamethoxazole-trimethoprim (BACTRIM DS) 800-160 MG tablet  Dysuria - Plan: POCT Urinalysis DIP (Proadvantage Device)  Counseling about travel Discussed trouble with her in regard to taking all of her medications and storing them properly.  Have other OTC meds available i.e. Tylenol.  She will discuss with her cardiologist adding aspirin back to her regimen just for the flights.  Also discussed taking bug spray, suntan lotions etc.  She is going to developed countries so avoiding water should not be an issue.  She should also make sure that her immunizations are up-to-date.

## 2022-08-17 NOTE — Telephone Encounter (Signed)
Pt needs refill Breo for 90 days before her insurance changes to [facility]

## 2022-08-18 ENCOUNTER — Other Ambulatory Visit: Payer: Self-pay | Admitting: Family Medicine

## 2022-08-18 DIAGNOSIS — E1169 Type 2 diabetes mellitus with other specified complication: Secondary | ICD-10-CM

## 2022-08-18 DIAGNOSIS — I152 Hypertension secondary to endocrine disorders: Secondary | ICD-10-CM

## 2022-08-18 LAB — IRON,TIBC AND FERRITIN PANEL
Ferritin: 146 ng/mL (ref 15–150)
Iron Saturation: 25 % (ref 15–55)
Iron: 104 ug/dL (ref 27–139)
Total Iron Binding Capacity: 412 ug/dL (ref 250–450)
UIBC: 308 ug/dL (ref 118–369)

## 2022-08-18 LAB — CBC
Hematocrit: 39.7 % (ref 34.0–46.6)
Hemoglobin: 12.6 g/dL (ref 11.1–15.9)
MCH: 27.3 pg (ref 26.6–33.0)
MCHC: 31.7 g/dL (ref 31.5–35.7)
MCV: 86 fL (ref 79–97)
Platelets: 273 10*3/uL (ref 150–450)
RBC: 4.62 x10E6/uL (ref 3.77–5.28)
RDW: 21.5 % — ABNORMAL HIGH (ref 11.7–15.4)
WBC: 6.5 10*3/uL (ref 3.4–10.8)

## 2022-08-18 LAB — BASIC METABOLIC PANEL
BUN/Creatinine Ratio: 19 (ref 12–28)
BUN: 26 mg/dL (ref 8–27)
CO2: 21 mmol/L (ref 20–29)
Calcium: 9.8 mg/dL (ref 8.7–10.3)
Chloride: 105 mmol/L (ref 96–106)
Creatinine, Ser: 1.34 mg/dL — ABNORMAL HIGH (ref 0.57–1.00)
Glucose: 107 mg/dL — ABNORMAL HIGH (ref 70–99)
Potassium: 5.3 mmol/L — ABNORMAL HIGH (ref 3.5–5.2)
Sodium: 140 mmol/L (ref 134–144)
eGFR: 44 mL/min/{1.73_m2} — ABNORMAL LOW (ref >59)

## 2022-08-18 NOTE — Progress Notes (Signed)
Patient ID: Haley George, female   DOB: Feb 24, 1956, 67 y.o.   MRN: [id number]    Cardiology Office Note    Date:  08/19/2022   ID:  Haley George, DOB June 13, 1955, MRN [id number]  PCP:  Ronnald Nian, MD  Cardiologist:   Thurmon Fair, MD   Chief Complaint  Patient presents with   Coronary Artery Disease    History of Present Illness:  Haley George is a 67 y.o. female with diabetes mellitus type II, severe mixed hyperlipidemia, CAD requiring PCI in all 3 major coronary arteries including redo PCI for restenosis, mild calcific aortic stenosis, chronic diastolic heart failure, mild bilateral carotid artery stenosis (less than 50%), reactive airway disease (was better on bisoprolol rather than carvedilol).  Most recent intervention was in August 2021 with placement of 2 drug-eluting stents in the mid-distal left circumflex coronary artery for both de novo lesion and distal in-stent restenosis (small non-STEMI with peak troponin 1800).   A few months ago she developed symptoms of decompensated congestive heart failure with weight gain and lower extremity edema and exertional dyspnea.  This appeared to occur on the background of worsening iron deficiency anemia.  Symptoms improved with diuretics.  We also made arrangements for IV iron infusions and her hemoglobin has returned to normal.  Labs also show that iron stores are now replete.  She feels better.  She can take care of household chores without shortness of breath or angina.  Glycemic control is also substantially improved with the most recent hemoglobin A1c down to 6.2%.  Her LDL cholesterol is within target range but she has a chronically low HDL and slightly elevated triglycerides (in the past they have been moderate to severely elevated).  She is trying to do a good job with her diet, but faces numerous restrictions related to diabetes, dyslipidemia, CHF.  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  She has symptoms of cystitis (no fever, chills, back pain) and was prescribed Bactrim.  Her symptoms have already improved after just a day of treatment.  I recommended reducing the total duration of antibiotics to just 3 days to avoid worsening hyperkalemia from the trimethoprim.  She has not yet had her colonoscopy, which had to be canceled when she developed COVID.  She is scheduled for the colonoscopy in May.  She inquired about how to take the clopidogrel around the time of the colonoscopy and whether or not she can go back to donating blood.  I recommended stopping the clopidogrel 5 days before the colonoscopy and delaying blood transfusion for at least another month, until we have the results of her colonoscopy and we can make sure that she is not still losing blood.  She continues to struggle with depression which has been a problem in the last 2-1/2 years since can her husband passed away.  However, she is showing some signs of mood improvement.  One of her friends, who is also widowed, has recommended they take a trip together and they have decided to go on a cruise in [place] next February.  She seems excited for this.  Her daughter Trula Ore system will be living in [place], 4 hours drive from home.  The holidays alone were difficult.  She was again little teary-eyed today.    Her echocardiogram performed 04/22/2022 shows that she still has normal left ventricular systolic function and wall motion.  Aortic stenosis has progressed and is now moderate (  valve area 1.2 cm, mean gradient 21 mmHg, dimensionless index 0.47).  There was evidence of elevated mean left atrial pressure/pseudonormal mitral inflow (diastolic parameters are not normal as was reported).  Psoriasis is also not fully controlled.  In the past treatment with [facility] [place] was very poorly tolerated (rapid weight loss).  She is considering treatment with Stelara.  Her dermatologist is Dr. Emily Filbert.    She developed unstable angina  and she underwent cardiac catheterization on December 02, 2019.  The culprit lesion was found to be a 95% in-stent restenosis in the mid left circumflex coronary artery, as well as a de novo 70% mid left circumflex stenosis at the OM2 bifurcation.  This was treated by placement of overlapping 2.5x12 and 2.25x20 drug-eluting Synergy stents (Dr. Okey Dupre).  Also noted was a mild-moderate mid to distal LAD in-stent restenosis and an unchanged 80% RPDA lesion.  She had complete relief of her angina following this intervention.    Previous coronary interventions 2007  - mid LAD 2.2512 mini vision - left circumflex 2.518 Cypher  - RCA 3.023 Cypher  2010  - mid LAD 2.2512 Taxus overlapping 2.012 mini vision, overlapping another 2.012 mini vision -  left circumflex 2.516 Taxus upstream of previous stent.  July 2020  - mid LAD Synergy 2.5 x12 , overlapping the proximal area of restenosis in the mid LAD (short area of 3 stent layers).  August 2021 - mid LCX overlapping 2.5x12 and 2.25x20 drug-eluting Synergy (the distal stent overlaps the previously placed Taxus stent from 2010)  She had a normal nuclear stress test in November 2016. EF was 56%. She had a "false positive" ECG response.  In July 2020 she had a moderate sized area of ischemia in the anteroseptal wall, EF 52%, Prior to placement of the mid LAD stent.  Echo in August 2021 showed normal left ventricular regional motion and EF 55-60%, unchanged by echo in December 2023.  Past Medical History:  Diagnosis Date   Asthma    Cancer (HCC)    basal cell chest   Complication of anesthesia    Coronary artery disease    Diabetes mellitus without complication (HCC)    type 2   Heart murmur    Hepatitis    auto- immune   History of kidney stones    History of nuclear stress test 05/21/2010   dipyridamole; normal perfusion, preserved LV systolic EF of 66%   Hyperlipidemia    Hypertension    Liver failure (HCC)    h/o autoimmune hepatitis     Myocardial infarction (HCC)    Neuropathy    resolved with back surgery; tingling and burning left leg to foot   Obesity    PONV (postoperative nausea and vomiting)    Psoriasis    Tubular adenoma of colon    colon path 01/09/2017    Past Surgical History:  Procedure Laterality Date   Carotid Doppler  10/2008   R & L ICAs 0-49% diameter reduction    COLONOSCOPY     CORONARY ANGIOPLASTY WITH STENT PLACEMENT  09/2008   2.25x65mm Cypher DES to in-stent restenosis of prox LAD; Mini-Vision stent x2 2.0x57mm to area distal of initial LAD stent; 3 2.5x18mm Taxus stents prox to Cypher stent in circumflex   CORONARY ANGIOPLASTY WITH STENT PLACEMENT  09/2005   Cypher DES 3.0x58mm to distal RCA; 2.25x30mm Mini0Vision stent to mid LAD; 2.5x59mm Cypher stent to circumflex   CORONARY STENT INTERVENTION N/A 11/13/2018   Procedure: CORONARY STENT INTERVENTION;  Surgeon:  Corky Crafts, MD;  Location[facility] INVASIVE CV LAB;  Service: Cardiovascular;  Laterality: N/A;   CORONARY STENT INTERVENTION N/A 12/02/2019   Procedure: CORONARY STENT INTERVENTION;  Surgeon: Yvonne Kendall, MD;  Location: [facility] INVASIVE CV LAB;  Service: Cardiovascular;  Laterality: N/A;   LEFT HEART CATH AND CORONARY ANGIOGRAPHY N/A 11/13/2018   Procedure: LEFT HEART CATH AND CORONARY ANGIOGRAPHY;  Surgeon: Corky Crafts, MD;  Location: [facility] INVASIVE CV LAB;  Service: Cardiovascular;  Laterality: N/A;   LEFT HEART CATH AND CORONARY ANGIOGRAPHY N/A 12/02/2019   Procedure: LEFT HEART CATH AND CORONARY ANGIOGRAPHY;  Surgeon: Yvonne Kendall, MD;  Location: [facility] INVASIVE CV LAB;  Service: Cardiovascular;  Laterality: N/A;   LUMBAR LAMINECTOMY/DECOMPRESSION MICRODISCECTOMY Left 10/18/2016   Procedure: Left Lumbar Four-Five Microdiscectomy;  Surgeon: Maeola Harman, MD;  Location: [facility] OR;  Service: Neurosurgery;  Laterality: Left;  Left L4-5 Microdiscectomy   ROTATOR CUFF REPAIR Left    SHOULDER ARTHROSCOPY W/ ROTATOR CUFF REPAIR Right     TRANSTHORACIC ECHOCARDIOGRAM  04/2010   EF=>55%, borderline conc LVH; trace MR & TR; AV mildly sclerotic; aortic root sclerosis/calcif   TRIGGER FINGER RELEASE Left 02/19/2020   Procedure: RELEASE TRIGGER FINGER/A-1 PULLEY;  Surgeon: Frederico Hamman, MD;  Location: [facility] OR;  Service: Orthopedics;  Laterality: Left;   ureteral stents      Outpatient Medications Prior to Visit  Medication Sig Dispense Refill   ascorbic acid (VITAMIN C) 500 MG tablet Take 500 mg by mouth daily.     aspirin EC 81 MG tablet Take 1 tablet (81 mg total) by mouth daily.     Blood Glucose Monitoring Suppl (ONETOUCH VERIO) w/Device KIT Use to check blood sugar 2 times a day (Patient taking differently: 1 each by Other route See admin instructions. Use to check blood sugar 2 times a day) 1 kit 0   Cranberry 300 MG tablet Take 300 mg by mouth every evening.     fluticasone furoate-vilanterol (BREO ELLIPTA) 200-25 MCG/ACT AEPB Inhale 1 puff into the lungs daily. 3 each 3   glucose blood (ONETOUCH VERIO) test strip Use to check blood sugar 2 times a day 200 each 12   insulin aspart (NOVOLOG FLEXPEN) 100 UNIT/ML FlexPen Inject 8-12 Units into the skin 3 (three) times daily before meals. 30 mL 3   insulin detemir (LEVEMIR FLEXPEN) 100 UNIT/ML FlexPen Inject 50 Units into the skin at bedtime.     Insulin Pen Needle (BD PEN NEEDLE NANO 2ND GEN) 32G X 4 MM MISC USE AS DIRECTED FOR INSULIN ADMINISTRATION 300 each 3   metFORMIN (GLUCOPHAGE) 1000 MG tablet TAKE 1 TABLET (1,000 MG TOTAL) BY MOUTH TWICE A DAY WITH FOOD 180 tablet 3   Multiple Vitamins-Minerals (CENTRUM SILVER 50+WOMEN PO) Take 1 tablet by mouth daily.     polyethylene glycol-electrolytes (NULYTELY) 420 g solution See admin instructions.     ReliOn Lancets Thin 26G MISC 1 Units by Does not apply route 2 (two) times daily. E 11.9 100 each 12   Semaglutide, 1 MG/DOSE, (OZEMPIC, 1 MG/DOSE,) 4 MG/3ML SOPN Inject 1 mg into the skin once a week. 9 mL 3    sulfamethoxazole-trimethoprim (BACTRIM DS) 800-160 MG tablet Take 1 tablet by mouth 2 (two) times daily. 20 tablet 0   bisoprolol (ZEBETA) 5 MG tablet TAKE 5 MG IN THE MORNING AND 10 MG (TWO TABLETS) IN THE EVENING. 270 tablet 2   clopidogrel (PLAVIX) 75 MG tablet TAKE 1 TABLET BY MOUTH EVERY DAY WITH BREAKFAST 90 tablet  3   Coenzyme Q10 300 MG CAPS Take 300 mg by mouth daily.     Evolocumab (REPATHA SURECLICK) 140 MG/ML SOAJ Inject 140 mg into the skin every 14 (fourteen) days. 6 mL 3   fenofibrate (TRICOR) 145 MG tablet TAKE 1 TABLET (145 MG TOTAL) BY MOUTH EVERY EVENING 90 tablet 3   ferrous sulfate 324 (65 Fe) MG TBEC Take 1 tablet (324 mg total) by mouth in the morning and at bedtime. 180 tablet 3   glucosamine-chondroitin 500-400 MG tablet Take 1 tablet by mouth daily.     isosorbide mononitrate (IMDUR) 60 MG 24 hr tablet TAKE 1 TABLET (60 MG TOTAL) BY MOUTH DAILY. ** DO NOT CRUSH ** 90 tablet 1   meclizine (ANTIVERT) 25 MG tablet Take 1 tablet (25 mg total) by mouth 3 (three) times daily as needed for dizziness. 30 tablet 1   rosuvastatin (CRESTOR) 20 MG tablet TAKE 1 TABLET BY MOUTH EVERY DAY 90 tablet 3   valsartan (DIOVAN) 160 MG tablet TAKE 1 TABLET BY MOUTH EVERY DAY 90 tablet 0   VASCEPA 1 g capsule TAKE 2 CAPSULES BY MOUTH TWICE A DAY 360 capsule 0   calcipotriene (DOVONOX) 0.005 % ointment Apply topically daily in the afternoon. (Patient not taking: Reported on 08/19/2022)     Continuous Blood Gluc Sensor (FREESTYLE LIBRE 3 SENSOR) MISC 1 each by Does not apply route every 14 (fourteen) days. (Patient not taking: Reported on 08/19/2022) 6 each 3   insulin glargine (LANTUS SOLOSTAR) 100 UNIT/ML Solostar Pen Inject 50 Units into the skin daily. (Patient not taking: Reported on 08/19/2022) 45 mL 1   nystatin cream (MYCOSTATIN) Apply topically as needed. (Patient not taking: Reported on 08/19/2022)     bisoprolol (ZEBETA) 10 MG tablet TAKE 1 TABLET BY MOUTH EVERY DAY (Patient not taking:  Reported on 08/19/2022) 90 tablet 3   furosemide (LASIX) 20 MG tablet Take 20 mg on days weight is 185 or greater (Patient not taking: Reported on 08/19/2022) 30 tablet 2   Glucosamine-Chondroit-Vit C-Mn (GLUCOSAMINE CHONDR 1500 COMPLX PO) Take 1 tablet by mouth daily. (Patient not taking: Reported on 08/19/2022)     Magnesium Oxide 400 MG CAPS Take 1 capsule (400 mg total) by mouth in the morning and at bedtime. (Patient not taking: Reported on 08/19/2022) 180 capsule 3   nitroGLYCERIN (NITROSTAT) 0.4 MG SL tablet For chest pain, tightness, or pressure. While sitting, place 1 tablet under tongue. May be used every 5 minutes as needed, for up to 15 minutes. Do not use more than 3 tablets. (Patient not taking: Reported on 08/19/2022) 25 tablet 3   No facility-administered medications prior to visit.     Allergies:   Penicillins, Coconut flavor, Cocos nucifera, and Other   Social History   Socioeconomic History   Marital status: Married    Spouse name: Not on file   Number of children: 1   Years of education: Not on file   Highest education level: Not on file  Occupational History    Employer: [employer]  Tobacco Use   Smoking status: Never   Smokeless tobacco: Never  Vaping Use   Vaping Use: Never used  Substance and Sexual Activity   Alcohol use: No   Drug use: No   Sexual activity: Not Currently  Other Topics Concern   Not on file  Social History Narrative   Not on file   Social Determinants of Health   Financial Resource Strain: Low Risk  (11/11/2018)  Overall Financial Resource Strain (CARDIA)    Difficulty of Paying Living Expenses: Not very hard  Food Insecurity: No Food Insecurity (11/11/2018)   Hunger Vital Sign    Worried About Running Out of Food in the Last Year: Never true    Ran Out of Food in the Last Year: Never true  Transportation Needs: No Transportation Needs (11/11/2018)   PRAPARE - Administrator, Civil Service (Medical): No     Lack of Transportation (Non-Medical): No  Physical Activity: Unknown (11/11/2018)   Exercise Vital Sign    Days of Exercise per Week: 2 days    Minutes of Exercise per Session: Not on file  Stress: No Stress Concern Present (11/11/2018)   [employer] of Occupational Health - Occupational Stress Questionnaire    Feeling of Stress : Only a little  Social Connections: Unknown (11/11/2018)   Social Connection and Isolation Panel [NHANES]    Frequency of Communication with Friends and Family: Three times a week    Frequency of Social Gatherings with Friends and Family: Not on file    Attends Religious Services: Not on file    Active Member of Clubs or Organizations: Yes    Attends Banker Meetings: Not on file    Marital Status: Married     ROS:   Please see the history of present illness.    ROS  All other systems are reviewed and are negative.   PHYSICAL EXAM:   VS:  BP 130/78   Ht 5' 4.25" (1.632 m)   Wt 175 lb 12.8 oz (79.7 kg)   SpO2 98%   BMI 29.94 kg/m      General: Alert, oriented x3, no distress, overweight, no longer obese Head: no evidence of trauma, PERRL, EOMI, no exophtalmos or lid lag, no myxedema, no xanthelasma; normal ears, nose and oropharynx Neck: normal jugular venous pulsations and no hepatojugular reflux; brisk carotid pulses without delay and loud bilateral carotid bruits that appear to radiate from the chest Chest: clear to auscultation, no signs of consolidation by percussion or palpation, normal fremitus, symmetrical and full respiratory excursions Cardiovascular: normal position and quality of the apical impulse, regular rhythm, normal first and second heart sounds, 3/6 early peaking aortic ejection murmur, no diastolic murmurs, rubs or gallops Abdomen: no tenderness or distention, no masses by palpation, no abnormal pulsatility or arterial bruits, normal bowel sounds, no hepatosplenomegaly Extremities: no clubbing, cyanosis or edema; 2+  radial, ulnar and brachial pulses bilaterally; 2+ right femoral, posterior tibial and dorsalis pedis pulses; 2+ left femoral, posterior tibial and dorsalis pedis pulses; no subclavian or femoral bruits Neurological: grossly nonfocal Psych: Normal mood and affect    Wt Readings from Last 3 Encounters:  08/19/22 175 lb 12.8 oz (79.7 kg)  08/17/22 178 lb 12.8 oz (81.1 kg)  07/21/22 178 lb (80.7 kg)    Studies/Labs Reviewed:   ECHO12/29/2023     1. Left ventricular ejection fraction, by estimation, is 60 to 65%. The  left ventricle has normal function. The left ventricle has no regional  wall motion abnormalities. There is mild left ventricular hypertrophy.  Left ventricular diastolic parameters  were normal.   2. Right ventricular systolic function is normal. The right ventricular  size is normal. There is normal pulmonary artery systolic pressure.   3. Left atrial size was mildly dilated.   4. The mitral valve is abnormal. Mild mitral valve regurgitation. No  evidence of mitral stenosis.   5. The aortic valve is  tricuspid. There is moderate calcification of the  aortic valve. There is moderate thickening of the aortic valve. Aortic  valve regurgitation is not visualized. Moderate aortic valve stenosis.   6. The inferior vena cava is dilated in size with >50% respiratory  variability, suggesting right atrial pressure of 8 mmHg.   LV e' medial:    7.40 cm/s  LV E/e' medial:  16.4  LV e' lateral:   7.29 cm/s  LV E/e' lateral: 16.6   AV Area (VTI):     1.19 cm  AV Vmax:           306.00 cm/s  AV Peak Grad:      37.5 mmHg  AV Mean Grad:      20.8 mmHg  LVOT/AV VTI ratio: 0.47   EKG: Not ordered today.  Reviewed the tracing from 04/21/2022 shows normal sinus rhythm, very subtle ST segment depression in lead I and T wave inversion in aVL.  Minimal changes from previous tracing in 2022.  Recent Labs: 04/21/2022: BNP 246.3 06/16/2022: ALT 18; Magnesium 1.6 08/17/2022: BUN 26;  Creatinine, Ser 1.34; Hemoglobin 12.6; Platelets 273; Potassium 5.3; Sodium 140   Lipid Panel    Component Value Date/Time   CHOL 146 06/16/2022 1010   TRIG 324 (H) 06/16/2022 1010   HDL 26 (L) 06/16/2022 1010   CHOLHDL 5.6 (H) 06/16/2022 1010   CHOLHDL 6.7 12/02/2019 0336   VLDL UNABLE TO CALCULATE IF TRIGLYCERIDE OVER 400 mg/dL 16/01/9603 5409   LDLCALC 68 06/16/2022 1010   LDLDIRECT 93 11/15/2012 0829    ASSESSMENT:    1. Chronic diastolic heart failure (HCC)   2. Coronary artery disease of native artery of native heart with stable angina pectoris (HCC)   3. Mild persistent asthma without complication   4. Mixed hyperlipidemia   5. Aortic valve stenosis, nonrheumatic   6. Essential hypertension   7. Insulin-requiring or dependent type II diabetes mellitus (HCC)   8. Stage 3b chronic kidney disease (HCC)   9. Class 1 obesity due to excess calories with serious comorbidity and body mass index (BMI) of 34.0 to 34.9 in adult   10. Iron deficiency anemia due to chronic blood loss    PLAN:  In order of problems listed above:  CHF: Appears clinically euvolemic.  Does not require furosemide on a daily basis and in fact has not taken any loop diuretics since December 2023.  She had an episode of acute diastolic heart failure in late December,, during a period of worsening depression and poor compliance with diet and weight monitoring.  Although the echo reportedly shows normal diastolic parameters, on review of E/e' is elevated consistent with pseudonormal mitral inflow/elevated mean left atrial pressure.  Prescribed furosemide to "as needed" for weight 185 lb or higher, but we may have to revisit that since she is probably lost some real weight. She has preserved left ventricular systolic function.  Continues to have fairly frequent urinary tract infections and we can have to hold off prescribing SGLT2 inhibitors. CAD:   No recent problems with angina at rest or with activity.  (there's  always been some suspicion of a contribution of coronary spasm, but we had to switch from carvedilol to bisoprolol due to reactive airway disease.).  Taking chronic antiplatelet therapy with clopidogrel long term due to complex CAD.  On highly effective lipid-lowering therapy with combination rosuvastatin and Repatha and Vascepa and fenofibrate.  Hold clopidogrel for 5 days before the colonoscopy.  Resume when advised by the  gastroenterologist. Asthma: Seems to be doing well on the more selective beta-blocker.  Bisoprolol is necessary to avoid worsening bronchospasm and has been so with less selective beta-blockers carvedilol and metoprolol. HLP: Excellent LDL, chronically very low HDL.  She has multiple family members with early onset CAD including an uncle who passed away at age 65 and an uncle that required bypass surgery at 61. She is on a very aggressive lipid-lowering therapy with PCSK9 inhibitor, high-dose statin,  fenofibrate, Vascepa.  All of these are clearly necessary and she still has elevated triglycerides despite taking them.  Recheck a lipid profile later this year. AS: Moderate by most recent echo.  Unlikely to be symptomatic at this point.  Plan to recheck echo in December.   HTN: Well-controlled after increase bisoprolol to a total of 15 mg daily. DM: Marked improvement in glycemic control.  Congratulated. CKD 3B: Creatinine is at baseline around 1.3 (GFR approximately 45).   She has had a couple of events in the last 3 years where she had acute kidney injury with a creatinine that increased to 2.5-3.0 range, most recently due to her episode of vertigo in 2023 it appears that her new creatinine baseline is around 1.3-1.4, which would correspond to a GFR of around 45.  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  Avoid other medications that can raise potassium such as aldosterone antagonist, trimethoprim. Overweight/mildly  obese: She is lost a little weight and is now no longer in obese range. Anemia: After receiving intravenous iron she now has normal hemoglobin and normal iron stores.  I think this is largely the reason why her symptoms have improved.. Muscle cramps: Did not complain of these today. Psoriasis:  sees Dr. Emily Filbert. Depression: Seems to worsen around the holidays and around Ken's birthday and anniversary of his death. She is staying socially engaged.  I am glad that she is showing interest in the planned trip to [place] with her friend. BPV: Not currently active.  This was severe enough to cause hospitalization for a couple of days in early May, but has resolved.  She is aware of Epley maneuvers.  Usually improves promptly when she starts meclizine.   Medication Adjustments/Labs and Tests Ordered: Current medicines are reviewed at length with the patient today.  Concerns regarding medicines are outlined above.  Medication changes, Labs and Tests ordered today are listed in the Patient Instructions below. Patient Instructions  Medication Instructions:  Take Bisoprolol 0.5 tablet (5 MG) in the morning, and Take One Tablet (10 MG) by mouth in the evening.  *If you need a refill on your cardiac medications before your next appointment, please call your pharmacy*   Lab Work: None Ordered  Testing/Procedures: None Ordered  Follow-Up: At [facility], you and your health needs are our priority.  As part of our continuing mission to provide you with exceptional heart care, we have created designated Provider Care Teams.  These Care Teams include your primary Cardiologist (physician) and Advanced Practice Providers (APPs -  Physician Assistants and Nurse Practitioners) who all work together to provide you with the care you need, when you need it.   Your next appointment:   6 month(s)  Provider:   Thurmon Fair, MD           Signed, Thurmon Fair, MD  08/19/2022 11:06 AM    [facility]  [facility] [address] [place], [place], [place]  [address]  Phone: [phone]; Fax: [phone]

## 2022-08-19 ENCOUNTER — Encounter: Payer: Self-pay | Admitting: Cardiovascular Disease

## 2022-08-19 ENCOUNTER — Ambulatory Visit: Payer: BC Managed Care – PPO | Attending: Cardiovascular Disease | Admitting: Cardiovascular Disease

## 2022-08-19 ENCOUNTER — Other Ambulatory Visit: Payer: Self-pay

## 2022-08-19 VITALS — BP 130/78 | Ht 64.25 in | Wt 175.8 lb

## 2022-08-19 DIAGNOSIS — I10 Essential (primary) hypertension: Secondary | ICD-10-CM

## 2022-08-19 DIAGNOSIS — E782 Mixed hyperlipidemia: Secondary | ICD-10-CM

## 2022-08-19 DIAGNOSIS — Z794 Long term (current) use of insulin: Secondary | ICD-10-CM

## 2022-08-19 DIAGNOSIS — R42 Dizziness and giddiness: Secondary | ICD-10-CM

## 2022-08-19 DIAGNOSIS — I25118 Atherosclerotic heart disease of native coronary artery with other forms of angina pectoris: Secondary | ICD-10-CM

## 2022-08-19 DIAGNOSIS — D500 Iron deficiency anemia secondary to blood loss (chronic): Secondary | ICD-10-CM

## 2022-08-19 DIAGNOSIS — J4530 Mild persistent asthma, uncomplicated: Secondary | ICD-10-CM | POA: Diagnosis not present

## 2022-08-19 DIAGNOSIS — I5032 Chronic diastolic (congestive) heart failure: Secondary | ICD-10-CM | POA: Diagnosis not present

## 2022-08-19 DIAGNOSIS — I350 Nonrheumatic aortic (valve) stenosis: Secondary | ICD-10-CM

## 2022-08-19 DIAGNOSIS — E119 Type 2 diabetes mellitus without complications: Secondary | ICD-10-CM

## 2022-08-19 DIAGNOSIS — E6609 Other obesity due to excess calories: Secondary | ICD-10-CM

## 2022-08-19 DIAGNOSIS — E1169 Type 2 diabetes mellitus with other specified complication: Secondary | ICD-10-CM

## 2022-08-19 DIAGNOSIS — N1832 Chronic kidney disease, stage 3b: Secondary | ICD-10-CM

## 2022-08-19 DIAGNOSIS — I152 Hypertension secondary to endocrine disorders: Secondary | ICD-10-CM

## 2022-08-19 DIAGNOSIS — Z6834 Body mass index (BMI) 34.0-34.9, adult: Secondary | ICD-10-CM

## 2022-08-19 MED ORDER — CLOPIDOGREL BISULFATE 75 MG PO TABS: ORAL_TABLET | ORAL | 3 refills | Status: DC

## 2022-08-19 MED ORDER — REPATHA SURECLICK 140 MG/ML ~~LOC~~ SOAJ
1.0000 mL | SUBCUTANEOUS | 3 refills | Status: DC
Start: 2022-08-19 — End: 2023-09-04

## 2022-08-19 MED ORDER — FENOFIBRATE 145 MG PO TABS: 145.0000 mg | ORAL_TABLET | Freq: Every evening | ORAL | 3 refills | Status: DC

## 2022-08-19 MED ORDER — ICOSAPENT ETHYL 1 G PO CAPS
2.0000 g | ORAL_CAPSULE | Freq: Two times a day (BID) | ORAL | 3 refills | Status: DC
Start: 2022-08-19 — End: 2022-08-24

## 2022-08-19 MED ORDER — MAGNESIUM OXIDE 400 MG PO CAPS: 400.0000 mg | ORAL_CAPSULE | Freq: Two times a day (BID) | ORAL | 3 refills | Status: DC

## 2022-08-19 MED ORDER — NITROGLYCERIN 0.4 MG SL SUBL: SUBLINGUAL_TABLET | SUBLINGUAL | 3 refills | Status: DC

## 2022-08-19 MED ORDER — BISOPROLOL FUMARATE 10 MG PO TABS: ORAL_TABLET | ORAL | 3 refills | Status: DC

## 2022-08-19 MED ORDER — MECLIZINE HCL 25 MG PO TABS
25.0000 mg | ORAL_TABLET | Freq: Three times a day (TID) | ORAL | 1 refills | Status: DC | PRN
Start: 2022-08-19 — End: 2023-05-18

## 2022-08-19 MED ORDER — FUROSEMIDE 20 MG PO TABS: ORAL_TABLET | ORAL | 2 refills | Status: AC

## 2022-08-19 MED ORDER — COENZYME Q10 300 MG PO CAPS: 300.0000 mg | ORAL_CAPSULE | Freq: Every day | ORAL | 3 refills | Status: DC

## 2022-08-19 MED ORDER — FERROUS SULFATE 324 (65 FE) MG PO TBEC: 324.0000 mg | DELAYED_RELEASE_TABLET | Freq: Two times a day (BID) | ORAL | 3 refills | Status: DC

## 2022-08-19 MED ORDER — ROSUVASTATIN CALCIUM 20 MG PO TABS
20.0000 mg | ORAL_TABLET | Freq: Every day | ORAL | 3 refills | Status: DC
Start: 2022-08-19 — End: 2023-06-12

## 2022-08-19 MED ORDER — GLUCOSAMINE-CHONDROITIN 500-400 MG PO TABS: 1.0000 | ORAL_TABLET | Freq: Every day | ORAL | 3 refills | Status: DC

## 2022-08-19 MED ORDER — VALSARTAN 160 MG PO TABS
160.0000 mg | ORAL_TABLET | Freq: Every day | ORAL | 3 refills | Status: DC
Start: 2022-08-19 — End: 2023-01-17

## 2022-08-19 MED ORDER — ISOSORBIDE MONONITRATE ER 60 MG PO TB24: 60.0000 mg | ORAL_TABLET | Freq: Every day | ORAL | 1 refills | Status: DC

## 2022-08-19 NOTE — Patient Instructions (Signed)
Medication Instructions:  Take Bisoprolol 0.5 tablet (5 MG) in the morning, and Take One Tablet (10 MG) by mouth in the evening.  *If you need a refill on your cardiac medications before your next appointment, please call your pharmacy*   Lab Work: None Ordered  Testing/Procedures: None Ordered  Follow-Up: At [facility], you and your health needs are our priority.  As part of our continuing mission to provide you with exceptional heart care, we have created designated Provider Care Teams.  These Care Teams include your primary Cardiologist (physician) and Advanced Practice Providers (APPs -  Physician Assistants and Nurse Practitioners) who all work together to provide you with the care you need, when you need it.   Your next appointment:   6 month(s)  Provider:   Thurmon Fair, MD

## 2022-08-22 ENCOUNTER — Telehealth: Payer: Self-pay | Admitting: Cardiovascular Disease

## 2022-08-22 DIAGNOSIS — E1169 Type 2 diabetes mellitus with other specified complication: Secondary | ICD-10-CM

## 2022-08-22 NOTE — Telephone Encounter (Signed)
Pt states her insurance company has faxed over three tier requests. She states she spoke to [place] last week about them but her insurance company has not heard back yet. Please advise to update pt

## 2022-08-22 NOTE — Telephone Encounter (Signed)
Attempted to call patient, left message for patient to call back to office.   

## 2022-08-23 ENCOUNTER — Other Ambulatory Visit: Payer: Self-pay

## 2022-08-23 ENCOUNTER — Telehealth: Payer: Self-pay | Admitting: Internal Medicine

## 2022-08-23 DIAGNOSIS — E1159 Type 2 diabetes mellitus with other circulatory complications: Secondary | ICD-10-CM

## 2022-08-23 MED ORDER — LEVEMIR FLEXPEN 100 UNIT/ML ~~LOC~~ SOPN
50.0000 [IU] | PEN_INJECTOR | Freq: Every day | SUBCUTANEOUS | 5 refills | Status: DC
Start: 2022-08-23 — End: 2022-12-09

## 2022-08-23 NOTE — Telephone Encounter (Signed)
Casimiro Needle with [facility] called in about this request. He ask that someone call so this request can be done over phone since faxes haven't been received.

## 2022-08-23 NOTE — Telephone Encounter (Signed)
Spoke with patient regarding her paperwork.  Noted it has not been received and asked to verify fax number.  She is very abrupt with conversation.  She states they [employer]) has sent it to [place] and that they got the fax from a website .  I stated it could be incorrect and would like to give her a number for when she speaks to them,  She states we need to call them because they have already called [place].  No call found from insurance, but she then states she was on the phone with them when she spoke to a nurse in the office. I advised understanding but we did not speak with them and they have not called or faxed.   She then states she will take the number and "start the whole process again." Advised we can call them or if they call us, to ask for Triage. She hangs up

## 2022-08-23 NOTE — Telephone Encounter (Signed)
No encounters in chart indicating patient or pharmacy has contacted [place] regarding this rx.  Rx sent to pharmacy.

## 2022-08-23 NOTE — Telephone Encounter (Signed)
MEDICATION: Levemir FlexPen insulin detemir (LEVEMIR FLEXPEN) 100 UNIT/ML FlexPen  PHARMACY:    [id number] - [place], [place] - [address] (Ph: [phone])    HAS THE PATIENT CONTACTED THEIR PHARMACY?  Yes  IS THIS A 90 DAY SUPPLY : Yes  IS PATIENT OUT OF MEDICATION: No  IF NOT; HOW MUCH IS LEFT: 1 Pen  LAST APPOINTMENT DATE: @3 /28/2024  NEXT APPOINTMENT DATE:@8 /16/2024  DO WE HAVE YOUR PERMISSION TO LEAVE A DETAILED MESSAGE?: Yes  OTHER COMMENTS: Patient starts Medicare tomorrow and her out of pocket cost will be doubled.  Patient states that [facility] since last Wednesday to get refilled.   **Let patient know to contact pharmacy at the end of the day to make sure medication is ready. **  ** Please notify patient to allow 48-72 hours to process**  **Encourage patient to contact the pharmacy for refills or they can request refills through [facility]**

## 2022-08-24 ENCOUNTER — Telehealth: Payer: Self-pay

## 2022-08-24 ENCOUNTER — Other Ambulatory Visit (HOSPITAL_COMMUNITY): Payer: Self-pay

## 2022-08-24 ENCOUNTER — Encounter: Payer: Self-pay | Admitting: Pulmonary Disease

## 2022-08-24 MED ORDER — ICOSAPENT ETHYL 1 G PO CAPS
2.0000 g | ORAL_CAPSULE | Freq: Two times a day (BID) | ORAL | 3 refills | Status: DC
Start: 2022-08-24 — End: 2023-05-18

## 2022-08-24 NOTE — Telephone Encounter (Signed)
So Repatha has a PA pending.   Scot Dock is covered as brand name. Please send pharmacy an updated script for the brand name so they can run with a DAW 1 to get a paid claim.

## 2022-08-24 NOTE — Telephone Encounter (Signed)
Patient states the PA is for Repatha and Vascepa

## 2022-08-24 NOTE — Telephone Encounter (Signed)
Left message to verify medication

## 2022-08-24 NOTE — Addendum Note (Signed)
Addended by: Candie Chroman on: 08/24/2022 11:56 AM   Modules accepted: Orders

## 2022-08-24 NOTE — Telephone Encounter (Signed)
Call to patient and LVM to call office

## 2022-08-24 NOTE — Telephone Encounter (Signed)
Whats the medication.

## 2022-08-26 ENCOUNTER — Telehealth: Payer: Self-pay | Admitting: Family Medicine

## 2022-08-26 NOTE — Telephone Encounter (Signed)
TIERING EXCEPTION BREO form completed

## 2022-08-29 NOTE — Telephone Encounter (Signed)
LVM

## 2022-08-30 ENCOUNTER — Other Ambulatory Visit (HOSPITAL_COMMUNITY): Payer: Self-pay

## 2022-08-30 ENCOUNTER — Telehealth: Payer: Self-pay

## 2022-08-30 ENCOUNTER — Encounter: Payer: Self-pay | Admitting: Medical

## 2022-08-30 ENCOUNTER — Ambulatory Visit: Payer: Medicare PPO | Admitting: Medical

## 2022-08-30 ENCOUNTER — Encounter: Payer: Self-pay | Admitting: Pulmonary Disease

## 2022-08-30 VITALS — BP 110/60 | HR 100 | Temp 98.1°F | Ht 64.25 in | Wt 179.0 lb

## 2022-08-30 DIAGNOSIS — R1031 Right lower quadrant pain: Secondary | ICD-10-CM

## 2022-08-30 DIAGNOSIS — N390 Urinary tract infection, site not specified: Secondary | ICD-10-CM | POA: Diagnosis not present

## 2022-08-30 LAB — POCT URINALYSIS DIP (PROADVANTAGE DEVICE)
Bilirubin, UA: NEGATIVE
Blood, UA: NEGATIVE
Glucose, UA: NEGATIVE mg/dL
Nitrite, UA: NEGATIVE
Protein Ur, POC: NEGATIVE mg/dL
Specific Gravity, Urine: 1.02
Urobilinogen, Ur: 0.2
pH, UA: 6 (ref 5.0–8.0)

## 2022-08-30 MED ORDER — CIPROFLOXACIN HCL 500 MG PO TABS: 500.0000 mg | ORAL_TABLET | Freq: Two times a day (BID) | ORAL | 0 refills | Status: AC

## 2022-08-30 NOTE — Progress Notes (Signed)
Subjective:  Haley George is a 67 y.o. female who presents for Chief Complaint  Patient presents with   Abdominal Pain    Lower right sided abdominal pain that began yesterday. Has been dealing with a UTI. She is travelling tomorrow and wanted to make sure she was ok. No blood last time just leuks. Saw cards last week and she was told to stop medication as it was interacting with one her cardiac meds. (Bactrim)     Here for f/u.  Was originally seen by Dr. Susann George here 08/17/22 for UTI, placed on Bactrim.   Saw cardiology 08/19/22, but they advised she discontinue bactrim due to medicaiton interaction.  She ended up getting total of 5.5 days of bactrim.  Felt better initially but in the last 2 days have some twinges of pain in right lower abdomen.  No back pain.  Has had some loose stool but no fever.   Has had various in UTIs, sometimes with a lot of ill symptoms, sometimes not much sick as well.  Widowed, no concern for STD.  No vaginal itching or discharge.   No vomiting.   Gets nausea, but gets this with ozempic in general. No polyuria, no urgency.  No blood in urine.  Has had kidney stone prior.   Last stone 10 years ago.  No other aggravating or relieving factors.    No other c/o.  The following portions of the patient's history were reviewed and updated as appropriate: allergies, current medications, past family history, past medical history, past social history, past surgical history and problem list.  ROS Otherwise as in subjective above  Objective: BP 110/60   Pulse 100   Temp 98.1 F (36.7 C) (Tympanic)   Ht 5' 4.25" (1.632 m)   Wt 179 lb (81.2 kg)   SpO2 98%   BMI 30.49 kg/m   Wt Readings from Last 3 Encounters:  08/30/22 179 lb (81.2 kg)  08/19/22 175 lb 12.8 oz (79.7 kg)  08/17/22 178 lb 12.8 oz (81.1 kg)    General appearance: alert, no distress, well developed, well nourished Back: nontender Abdomen: +bs, soft, mild right mid to upper quadrant tendnerss, otherwise  non tender, non distended, no masses, no hepatomegaly, no splenomegaly   Assessment: Encounter Diagnoses  Name Primary?   Right lower quadrant abdominal pain Yes   Urinary tract infection without hematuria, site unspecified      Plan: We discussed her symptoms and concerns.  I reviewed her August 17, 2022 visit here with Dr. Susann George.  She had some blood work done at that visit including basic metabolic, CBC and iron panel.  Her urine at that time showed positive nitrites leukocytes and protein.  Today urine just shows some leukocytes.  We discussed differential for right sided abdominal pain.   Today her symptoms and exam do not suggest anything more severe such as appendicitis, renal stone or gallbladder issues.  She may have residual urinary tract infection not resolved.  Begin a few days of Cipro, continue to hydrate well.  If not much improved in the next 3 to 4 days then call back or get reevaluated.  If much worse as discussed, then go to the emergency department  Haley George was seen today for abdominal pain.  Diagnoses and all orders for this visit:  Right lower quadrant abdominal pain -     POCT Urinalysis DIP (Proadvantage Device)  Urinary tract infection without hematuria, site unspecified  Other orders -     ciprofloxacin (CIPRO)  500 MG tablet; Take 1 tablet (500 mg total) by mouth 2 (two) times daily for 5 days.    Follow up: prn

## 2022-08-30 NOTE — Telephone Encounter (Signed)
Please note that we have received a denial letter in our fax que in regards to a tier exception for the patients Repatha 140mg /ml medication. This will require an appeal letter to move forward. (Please also see telephone encounter from 4/29.

## 2022-08-30 NOTE — Telephone Encounter (Signed)
The full document of the denial letter will be available in the patients media

## 2022-08-30 NOTE — Telephone Encounter (Signed)
Addressed in separate mssage pending patient call back

## 2022-08-30 NOTE — Telephone Encounter (Signed)
LVM to please call regarding her two medications one approved and one pending after denial

## 2022-09-05 ENCOUNTER — Telehealth: Payer: Self-pay | Admitting: Cardiovascular Disease

## 2022-09-05 NOTE — Telephone Encounter (Signed)
Patient is returning call in regards to medication Repatha

## 2022-09-05 NOTE — Telephone Encounter (Signed)
HI,    The p/a for repatha seemed to have been initiated at the office.Please create a document for an appeal of the repatha and we will follow up. (Please see prev encounters as well)

## 2022-09-05 NOTE — Telephone Encounter (Signed)
Called patient, advised of message below.   Per previous messages, they were going to have to do an appeal for Repatha. Will route to pharmacy team to advise.   She advised that she only has one more shot left.  Thanks!

## 2022-09-06 ENCOUNTER — Other Ambulatory Visit: Payer: Self-pay | Admitting: Cardiovascular Disease

## 2022-09-07 NOTE — Telephone Encounter (Signed)
Is this an appeals for a tier exception? You will not win a tier exception for a medication that is brand and already on the lowest brand tier.

## 2022-09-08 ENCOUNTER — Other Ambulatory Visit (HOSPITAL_COMMUNITY): Payer: Self-pay

## 2022-09-08 DIAGNOSIS — K449 Diaphragmatic hernia without obstruction or gangrene: Secondary | ICD-10-CM | POA: Insufficient documentation

## 2022-09-08 LAB — HM COLONOSCOPY

## 2022-09-08 NOTE — Telephone Encounter (Signed)
Appeals letter faxed to [facility]

## 2022-09-09 ENCOUNTER — Telehealth: Payer: Self-pay | Admitting: Cardiovascular Disease

## 2022-09-09 NOTE — Telephone Encounter (Signed)
Left voicemail for patient to return call to office. 

## 2022-09-09 NOTE — Telephone Encounter (Signed)
Patient is returning LPN's call. She states she is leaving at 4:15 pm today, so she is requesting a callback before then.   Please advise.

## 2022-09-09 NOTE — Telephone Encounter (Signed)
Patient is aware appeal denial. She stated that she does not qualify for the health well grant.She verbalized understanding. She also wanted to let dr. Royann Shivers know that ever since she started with medicare her prescriptions went up and she will not be able to afford her prescriptions.

## 2022-09-09 NOTE — Telephone Encounter (Signed)
Multiple encounters please see other encounter. Documentation is there

## 2022-09-09 NOTE — Telephone Encounter (Signed)
Medication is approved at its normal tier 3. Tier exception appeals denied. There is no other biologic at a lower tier. Unfortunatly there is now way to lower cost, but we can offer patient a healthwell grant. She can apply at [email or web address] or if she needs help we can help her.

## 2022-09-12 ENCOUNTER — Other Ambulatory Visit (HOSPITAL_COMMUNITY): Payer: Self-pay

## 2022-09-12 ENCOUNTER — Telehealth: Payer: Self-pay | Admitting: Licensed Clinical Social Worker

## 2022-09-12 ENCOUNTER — Encounter: Payer: Self-pay | Admitting: Pulmonary Disease

## 2022-09-12 NOTE — Telephone Encounter (Signed)
Hello, Isabel. Any idea hw we could help this lad. She is a widow and struggling financially with lots of expensive meds

## 2022-09-12 NOTE — Progress Notes (Unsigned)
Heart and Vascular Care Navigation  09/12/2022  Haley George [birth year] [id number]  Reason for Referral: Cost Concerns with Medications  Patient is participating in a Managed Medicaid Plan: No, [employer] Medicare  Engaged with patient by telephone for initial visit for Heart and Vascular Care Coordination.                                                                                                   Assessment:                                     LCSW spoke with pt via telephone at [phone], introduced self, role, reason for call. Pt confirmed home address, PCP and current insurance. Pt denies any cost of living concerns related to housing, food, utilities and transportation.   Pt shares that since transitioning from her commercial plan to Medicare her medication costs OOP have gone up significantly. We discussed her current income which is a combination of a pension and Tree [employer], unfortunately her monthly income is significantly over the monthly limit for [employer], Medicaid or [employer] Extra Help program. She also wouldn't be eligible for patient assistance through manufacturer. We discussed contacting [employer] to see if there are any additional options that they may be aware of, and to ensure that pt has the best plan for her medication needs. We dicussed that filling medications 3mos at a time can be a savings, if her insurance allows utilizing [facility] that may be more cost effective. Pt encouraged to let me know if there is anything else that we may be able to assist with when it arises.   HRT/VAS Care Coordination     Patients Home Cardiology Office [facility]   Outpatient Care Team Social Worker   Social Worker Name: Octavio Graves, [place], [phone]   Living arrangements for the past 2 months Single Family Home   Lives with: Self   Patient Current Insurance Coverage Managed Medicare   Patient Has Concern With Paying Medical Bills No    Does Patient Have Prescription Coverage? Yes   Home Assistive Devices/Equipment CBG Meter; Eyeglasses; Scales       Social History:                                                                             SDOH Screenings   Food Insecurity: No Food Insecurity (09/12/2022)  Housing: Low Risk  (09/12/2022)  Transportation Needs: No Transportation Needs (09/12/2022)  Utilities: Not At Risk (09/12/2022)  Depression (PHQ2-9): Low Risk  (08/17/2022)  Financial Resource Strain: Low Risk  (09/12/2022)  Physical Activity: Unknown (11/11/2018)  Social Connections: Unknown (11/11/2018)  Stress: No Stress Concern Present (11/11/2018)  Tobacco Use: Low Risk  (  08/30/2022)    SDOH Interventions: Financial Resources:  Financial Strain Interventions: Other (Comment) (discussed utilizing [employer] for Medicare questions/speaking with [facility] pharmacies re: affordability options)  Food Insecurity:  Food Insecurity Interventions: Intervention Not Indicated  Housing Insecurity:  Housing Interventions: Intervention Not Indicated  Transportation:   Transportation Interventions: Intervention Not Indicated    Other Care Navigation Interventions:     Provided Pharmacy assistance resources  Mailed information about [employer], discussed utilizing [facility] pharmacies if able for cost control   Follow-up plan:   LCSW has mailed pt my card and information about [employer] counseling and programming.  I remain available as needed.

## 2022-09-16 NOTE — Telephone Encounter (Signed)
Tiering exception denied, no lower cost brand drugs available for pt's condition, will call pt

## 2022-09-21 NOTE — Telephone Encounter (Signed)
Left message for pt, also offered Pt Assistance if needed

## 2022-10-05 ENCOUNTER — Telehealth: Payer: Self-pay | Admitting: Cardiovascular Disease

## 2022-10-05 DIAGNOSIS — D369 Benign neoplasm, unspecified site: Secondary | ICD-10-CM | POA: Insufficient documentation

## 2022-10-05 NOTE — Telephone Encounter (Signed)
Pt came to the office to speak to me directly, so we "wouldn't play phone tag," She, figured it would be easier this way.   She told me about hurting her Hip last week/over the weekend. She went to an Urgent Care- not in the [facility], where they did X-Rays/images on the patient. She was told by the PA there that she has an Aortic Aneurysm and also, that she has Atherosclerosis and needs to follow up with her Cardiologist about this.  They ordered an MRI- scheduled for 10/13/22 at [facility] to look at hips and up to L-3  She is asking if this order needs to be modified to be able to show entire aorta and atherosclosis/plaque?  I explained to her that I would send this information to the provider covering for Dr Royann Shivers, he will be back in office on Monday. Also, if further imaging was needed, not sure if it would be able to be done the same day because each procedure HAS TO BE authorized by her insurance.   She said the PA would be open to changing the current order with recommendations from the Cardiologist- if it would be easier to get what is needed if the other provider just changes the current order.   Informed her that I would send this info to provider and we will be in touch.

## 2022-10-05 NOTE — Telephone Encounter (Signed)
Spoke with patient and told that I am unable to see images and visit that she just had with Dr Dion Saucier. Informed her that I will send a fax requesting imaging report and visit notes. She verbalized understanding.

## 2022-10-05 NOTE — Telephone Encounter (Signed)
Also, called this pt because I do not see anything in CareEverywhere about the visit with Urgent Care and Imaging. Left message  This patient may Need to go to the office where she received care and fill out a release of information and have them send/fax over this information to our office.

## 2022-10-05 NOTE — Telephone Encounter (Signed)
Call to patient, straight to VM.  LM to call office

## 2022-10-05 NOTE — Telephone Encounter (Signed)
Will defer to Dr. Salena Saner -- when he returns - would need to see the study.  Bryan Lemma, MD

## 2022-10-05 NOTE — Telephone Encounter (Signed)
Pt is requesting a callback to discuss her new medical situation that has developed. Please advise.

## 2022-10-10 NOTE — Telephone Encounter (Signed)
It would be great if they could expand the study to include an MRI of the abdominal aorta to evaluate AAA.

## 2022-10-12 ENCOUNTER — Telehealth: Payer: Self-pay | Admitting: Emergency Medicine

## 2022-10-12 DIAGNOSIS — I714 Abdominal aortic aneurysm, without rupture, unspecified: Secondary | ICD-10-CM

## 2022-10-12 NOTE — Telephone Encounter (Signed)
Called and spoke with the MRI department at [facility]- asked them if we could expand the MRI study that the pt is getting tomorrow to include an MRI of Abdominal Aorta for AAA.  I was informed by the MRI tech that they do not have capability to do MRI to include the abdomen.  I called the patient and gave the information above. Informed her that I would send this information to Dr Royann Shivers and will get back in touch with recommendations. She verbalized understanding.

## 2022-10-14 NOTE — Addendum Note (Signed)
Addended by: Scheryl Marten on: 10/14/2022 02:54 PM   Modules accepted: Orders

## 2022-10-14 NOTE — Telephone Encounter (Signed)
Croitoru, Rachelle Hora, MD     Let's get a vascular US for AAA please   Test ordered  Called the pt and informed of Dr's recommendations; order placed. She will receive a call to get this scheduled. This will be performed in our office. She verbalized understanding.

## 2022-10-17 ENCOUNTER — Other Ambulatory Visit: Payer: Self-pay | Admitting: Internal Medicine

## 2022-10-18 ENCOUNTER — Encounter: Payer: Self-pay | Admitting: Pulmonary Disease

## 2022-10-19 ENCOUNTER — Encounter: Payer: Self-pay | Admitting: Pulmonary Disease

## 2022-10-26 ENCOUNTER — Ambulatory Visit (HOSPITAL_COMMUNITY)
Admission: RE | Admit: 2022-10-26 | Discharge: 2022-10-26 | Disposition: A | Discharge status: Home / Self Care | Payer: Medicare PPO | Source: Ambulatory Visit | Attending: Cardiovascular Disease | Admitting: Cardiovascular Disease

## 2022-10-26 DIAGNOSIS — I714 Abdominal aortic aneurysm, without rupture, unspecified: Secondary | ICD-10-CM | POA: Insufficient documentation

## 2022-11-07 ENCOUNTER — Telehealth: Payer: Self-pay | Admitting: Cardiovascular Disease

## 2022-11-07 NOTE — Telephone Encounter (Signed)
Patient is calling to receive her results from 7/3

## 2022-11-07 NOTE — Telephone Encounter (Signed)
Left voicemail for patient to return call to office. 

## 2022-11-08 NOTE — Telephone Encounter (Signed)
 LVM to call office.

## 2022-11-09 NOTE — Telephone Encounter (Signed)
Spoke with pt, abdominal US results given

## 2022-11-09 NOTE — Telephone Encounter (Signed)
Patient is returning call. Please advise? 

## 2022-11-22 DIAGNOSIS — M25552 Pain in left hip: Secondary | ICD-10-CM | POA: Diagnosis not present

## 2022-11-28 DIAGNOSIS — M25552 Pain in left hip: Secondary | ICD-10-CM | POA: Diagnosis not present

## 2022-12-09 ENCOUNTER — Encounter: Payer: Self-pay | Admitting: Internal Medicine

## 2022-12-09 ENCOUNTER — Telehealth: Payer: Self-pay

## 2022-12-09 ENCOUNTER — Ambulatory Visit: Payer: Medicare PPO | Admitting: Internal Medicine

## 2022-12-09 VITALS — BP 150/80 | HR 83 | Ht 64.25 in | Wt 177.4 lb

## 2022-12-09 DIAGNOSIS — E6609 Other obesity due to excess calories: Secondary | ICD-10-CM | POA: Diagnosis not present

## 2022-12-09 DIAGNOSIS — Z7985 Long-term (current) use of injectable non-insulin antidiabetic drugs: Secondary | ICD-10-CM

## 2022-12-09 DIAGNOSIS — E1159 Type 2 diabetes mellitus with other circulatory complications: Secondary | ICD-10-CM

## 2022-12-09 DIAGNOSIS — E118 Type 2 diabetes mellitus with unspecified complications: Secondary | ICD-10-CM

## 2022-12-09 DIAGNOSIS — Z6834 Body mass index (BMI) 34.0-34.9, adult: Secondary | ICD-10-CM

## 2022-12-09 DIAGNOSIS — E119 Type 2 diabetes mellitus without complications: Secondary | ICD-10-CM

## 2022-12-09 DIAGNOSIS — Z7984 Long term (current) use of oral hypoglycemic drugs: Secondary | ICD-10-CM

## 2022-12-09 DIAGNOSIS — E1169 Type 2 diabetes mellitus with other specified complication: Secondary | ICD-10-CM

## 2022-12-09 DIAGNOSIS — E785 Hyperlipidemia, unspecified: Secondary | ICD-10-CM | POA: Diagnosis not present

## 2022-12-09 DIAGNOSIS — Z794 Long term (current) use of insulin: Secondary | ICD-10-CM

## 2022-12-09 LAB — HEMOGLOBIN A1C: Hemoglobin A1C: 6.2

## 2022-12-09 MED ORDER — METFORMIN HCL 1000 MG PO TABS: 1000.0000 mg | ORAL_TABLET | Freq: Two times a day (BID) | ORAL | 3 refills | Status: DC

## 2022-12-09 MED ORDER — LANTUS SOLOSTAR 100 UNIT/ML ~~LOC~~ SOPN: 50.0000 [IU] | PEN_INJECTOR | Freq: Every day | SUBCUTANEOUS | 3 refills | Status: DC

## 2022-12-09 MED ORDER — NOVOLOG FLEXPEN 100 UNIT/ML ~~LOC~~ SOPN: 10.0000 [IU] | PEN_INJECTOR | Freq: Three times a day (TID) | SUBCUTANEOUS | Status: DC

## 2022-12-09 NOTE — Patient Instructions (Addendum)
Please continue: - Metformin 1000 mg 2x a day with meals - Lantus 50 units at bedtime - Ozempic 1 mg weekly  Try to change: - Novolog 10-16 units 15 min before meals  Please come back for a follow-up appointment in 4 months.

## 2022-12-09 NOTE — Progress Notes (Signed)
Patient ID: Haley George, female   DOB: 18-Aug-1955, 67 y.o.   MRN: [id number]   HPI: Haley George is a 67 y.o.-year-old female, initially referred by her PCP, Dr. Susann Givens, returning for follow-up for DM2, dx initially in 1997 2/2 liver failure, then GDM, then re-occuring in 2007, insulin-dependent, uncontrolled, with long-term complications (CAD-s/p NSTEMI in 11/2019, s/p stents, aortic valve stenosis; CKD).  Last visit 5 mo ago.  Interim history: No increased urination, blurry vision, chest pain.  She has nausea and vertigo in the last 3 weeks. Finally improving.  Reviewed HbA1c levels: Lab Results  Component Value Date   HGBA1C 6.2 (A) 07/21/2022   HGBA1C 9.1 (A) 04/28/2022   HGBA1C 7.9 (A) 12/14/2021   HGBA1C 6.9 (A) 08/12/2021   HGBA1C 7.0 (A) 01/28/2021   HGBA1C 6.3 (A) 12/08/2020   HGBA1C 7.4 (H) 08/20/2020   HGBA1C 7.0 (A) 07/28/2020   HGBA1C 6.2 (A) 01/28/2020   HGBA1C 6.6 (H) 12/02/2019  05/01/2019: HbA1c 6.2% She was on steroid injections in 2019.  Pt is on a regimen of: - Metformin 1000 mg 2x a day, with meals - Levemir 140 (70 units x2) units at bedtime >> 70 units >> ...45 >> Lantus 50 units at bedtime   - Ozempic 0.5 mg weekly-added 09/2018 >> 1 mg weekly >> 0.75 mg weekly due to gastric discomfort/nausea >> 1 mg weekly  - Novolog 8-12 units 15 min before meals - started 04/2022 >> 10-12 units She was on Actos before. She did not start [place]. In 04/2022, we stopped glipizide and started NovoLog.  Pt checks her sugars seldom: - am: 89, 102-131 >> 110-130 >> 154-239 >> 95-139, 143 >> 87-116 - 2h after b'fast: 161, 249 (eating out) >> n/c >> 192-238 >> n/c >> 131-188 - before lunch: n/c >> 129-190 >> n/c >> 313 >> 108-131 >> 129, 138 - 2h after lunch:  133-209 >> n/c >> 209-349 >> 128-160 >> 163-212 - before dinner: 132 >> 131-169 >> n/c >> 302 >> 110-119 >> 151, 170 - 2h after dinner: n/c >> 309-582 >> 147-220, 308 (forgot insulin) >> 168-228 - bedtime: 140-171  >> <200 >> 197-498 >> 132, 140 >> 170-188, 299 - nighttime: n/c Lowest sugar was 40 - 2012 >> ...  89 >> 90s >> 95 >> 87; she has hypoglycemia awareness in the 70s. Highest sugar was 200s >> 582 >> 308 >> 299.  Glucometer: ReliOn  Pt's meals are: - Breakfast: if not skipping - egg + whole wheat toast; cheerios + milk - Lunch: chicken salad, apple, veggies, trisquits  - Dinner: meat + 2 veggies +/- rolls  In the past, she was on [place] for autoimmune hepatitis.  She could not tolerate it and had to stop.  She lost 24 pounds on it.  -+ CKD, last BUN/creatinine:  Lab Results  Component Value Date   BUN 26 08/17/2022   BUN 21 06/16/2022   CREATININE 1.34 (H) 08/17/2022   CREATININE 1.27 (H) 06/16/2022  On [place] 160.  -+ HL; last set of lipids: Lab Results  Component Value Date   CHOL 146 06/16/2022   HDL 26 (L) 06/16/2022   LDLCALC 68 06/16/2022   LDLDIRECT 93 11/15/2012   TRIG 324 (H) 06/16/2022   CHOLHDL 5.6 (H) 06/16/2022  On Crestor 20, fenofibrate, Vascepa.  Also on Repatha.  - last eye exam was in 03/2022: No DR reportedly. Dr. Dione Booze.   - no numbness and tingling in her feet.  Last foot exam 07/21/2022.  Pt has FH of DM in M, B, M uncle, MGM.  She was admitted for chest pain 10/2018 after an episode of dehydration and had heart catheterization at that time >> had another stent and the previous stent was recanalized. She has a history of autoimmune hepatitis. Also, psoriasis, diagnosed as a child. She lost a signif. amount of weight on [place] in 2018.   She has a history of vertigo, improved on meclizine. She had Covid in 02/2021 >> could not eat and had GI issues for 3 weeks afterward. She had palpitations after Covid >>  improved. She sees Dr. Royann Shivers.  She has a h/o yeast infections. She got iron infusions in the past.  ROS: + see HPI  I reviewed pt's medications, allergies, PMH, social hx, family hx, and changes were documented in the history of present  illness. Otherwise, unchanged from my initial visit note.  Past Medical History:  Diagnosis Date   Asthma    Cancer (HCC)    basal cell chest   Complication of anesthesia    Coronary artery disease    Diabetes mellitus without complication (HCC)    type 2   Heart murmur    Hepatitis    auto- immune   History of kidney stones    History of nuclear stress test 05/21/2010   dipyridamole; normal perfusion, preserved LV systolic EF of 66%   Hyperlipidemia    Hypertension    Liver failure (HCC)    h/o autoimmune hepatitis    Myocardial infarction (HCC)    Neuropathy    resolved with back surgery; tingling and burning left leg to foot   Obesity    PONV (postoperative nausea and vomiting)    Psoriasis    Tubular adenoma of colon    colon path 01/09/2017   Past Surgical History:  Procedure Laterality Date   Carotid Doppler  10/2008   R & L ICAs 0-49% diameter reduction    COLONOSCOPY     CORONARY ANGIOPLASTY WITH STENT PLACEMENT  09/2008   2.25x23mm Cypher DES to in-stent restenosis of prox LAD; Mini-Vision stent x2 2.0x42mm to area distal of initial LAD stent; 3 2.5x52mm Taxus stents prox to Cypher stent in circumflex   CORONARY ANGIOPLASTY WITH STENT PLACEMENT  09/2005   Cypher DES 3.0x41mm to distal RCA; 2.25x50mm Mini0Vision stent to mid LAD; 2.5x47mm Cypher stent to circumflex   CORONARY STENT INTERVENTION N/A 11/13/2018   Procedure: CORONARY STENT INTERVENTION;  Surgeon: Corky Crafts, MD;  Location: [facility] INVASIVE CV LAB;  Service: Cardiovascular;  Laterality: N/A;   CORONARY STENT INTERVENTION N/A 12/02/2019   Procedure: CORONARY STENT INTERVENTION;  Surgeon: Yvonne Kendall, MD;  Location: [facility] INVASIVE CV LAB;  Service: Cardiovascular;  Laterality: N/A;   LEFT HEART CATH AND CORONARY ANGIOGRAPHY N/A 11/13/2018   Procedure: LEFT HEART CATH AND CORONARY ANGIOGRAPHY;  Surgeon: Corky Crafts, MD;  Location: [facility] INVASIVE CV LAB;  Service: Cardiovascular;  Laterality: N/A;    LEFT HEART CATH AND CORONARY ANGIOGRAPHY N/A 12/02/2019   Procedure: LEFT HEART CATH AND CORONARY ANGIOGRAPHY;  Surgeon: Yvonne Kendall, MD;  Location: [facility] INVASIVE CV LAB;  Service: Cardiovascular;  Laterality: N/A;   LUMBAR LAMINECTOMY/DECOMPRESSION MICRODISCECTOMY Left 10/18/2016   Procedure: Left Lumbar Four-Five Microdiscectomy;  Surgeon: Maeola Harman, MD;  Location: [facility] OR;  Service: Neurosurgery;  Laterality: Left;  Left L4-5 Microdiscectomy   ROTATOR CUFF REPAIR Left    SHOULDER ARTHROSCOPY W/ ROTATOR CUFF REPAIR Right    TRANSTHORACIC ECHOCARDIOGRAM  04/2010  EF=>55%, borderline conc LVH; trace MR & TR; AV mildly sclerotic; aortic root sclerosis/calcif   TRIGGER FINGER RELEASE Left 02/19/2020   Procedure: RELEASE TRIGGER FINGER/A-1 PULLEY;  Surgeon: Frederico Hamman, MD;  Location: [facility] OR;  Service: Orthopedics;  Laterality: Left;   ureteral stents     Social History   Socioeconomic History   Marital status: Single    Spouse name: Not on file   Number of children: 1   Years of education: Not on file   Highest education level: Not on file  Occupational History    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  Social Needs   Financial resource strain: Not on file   Food insecurity    Worry: Not on file    Inability: Not on file   Transportation needs    Medical: Not on file    Non-medical: Not on file  Tobacco Use   Smoking status: Never Smoker   Smokeless tobacco: Never Used  Substance and Sexual Activity   Alcohol use: No   Drug use: No   Sexual activity: Not Currently  Lifestyle   Physical activity    Days per week: Not on file    Minutes per session: Not on file   Stress: Not on file  Relationships   Social connections    Talks on phone: Not on file    Gets together: Not on file    Attends religious service: Not on file    Active member of club or organization: Not on file    Attends meetings of clubs or organizations: Not on  file    Relationship status: Not on file   Intimate partner violence    Fear of current or ex partner: Not on file    Emotionally abused: Not on file    Physically abused: Not on file    Forced sexual activity: Not on file  Other Topics Concern   Not on file  Social History Narrative   Not on file   Current Outpatient Medications on File Prior to Visit  Medication Sig Dispense Refill   ascorbic acid (VITAMIN C) 500 MG tablet Take 500 mg by mouth daily.     bisoprolol (ZEBETA) 10 MG tablet Take 0.5 Tablet (5 MG) by mouth in the morning. Take One tablet (10 MG) by mouth in the evening. 225 tablet 3   calcipotriene (DOVONOX) 0.005 % ointment Apply topically daily in the afternoon. (Patient not taking: Reported on 08/19/2022)     clopidogrel (PLAVIX) 75 MG tablet TAKE 1 TABLET BY MOUTH EVERY DAY WITH BREAKFAST 90 tablet 3   Coenzyme Q10 300 MG CAPS Take 1 capsule (300 mg total) by mouth daily. 90 capsule 3   Cranberry 300 MG tablet Take 300 mg by mouth every evening.     Evolocumab (REPATHA SURECLICK) 140 MG/ML SOAJ Inject 140 mg into the skin every 14 (fourteen) days. 6 mL 3   fenofibrate (TRICOR) 145 MG tablet Take 1 tablet (145 mg total) by mouth every evening. 90 tablet 3   ferrous sulfate 324 (65 Fe) MG TBEC Take 1 tablet (324 mg total) by mouth in the morning and at bedtime. 180 tablet 3   fluticasone furoate-vilanterol (BREO ELLIPTA) 200-25 MCG/ACT AEPB Inhale 1 puff into the lungs daily. 3 each 3   furosemide (LASIX) 20 MG tablet Take 20 mg on days weight is 185 or greater (Patient not taking: Reported on 08/30/2022) 30 tablet 2   glucosamine-chondroitin 500-400 MG tablet Take  1 tablet by mouth daily. 90 tablet 3   glucose blood (ONETOUCH VERIO) test strip Use to check blood sugar 2 times a day 200 each 12   icosapent Ethyl (VASCEPA) 1 g capsule Take 2 capsules (2 g total) by mouth 2 (two) times daily. 260 capsule 3   insulin aspart (NOVOLOG FLEXPEN) 100 UNIT/ML FlexPen Inject 8-12 Units  into the skin 3 (three) times daily before meals. 30 mL 3   insulin detemir (LEVEMIR FLEXPEN) 100 UNIT/ML FlexPen Inject 50 Units into the skin at bedtime. 15 mL 5   Insulin Pen Needle (BD PEN NEEDLE NANO 2ND GEN) 32G X 4 MM MISC USE AS DIRECTED FOR INSULIN ADMINISTRATION 300 each 3   isosorbide mononitrate (IMDUR) 60 MG 24 hr tablet Take 1 tablet (60 mg total) by mouth daily. ** DO NOT CRUSH ** 90 tablet 1   Magnesium Oxide 400 MG CAPS Take 1 capsule (400 mg total) by mouth in the morning and at bedtime. (Patient not taking: Reported on 08/30/2022) 180 capsule 3   meclizine (ANTIVERT) 25 MG tablet Take 1 tablet (25 mg total) by mouth 3 (three) times daily as needed for dizziness. (Patient not taking: Reported on 08/30/2022) 30 tablet 1   metFORMIN (GLUCOPHAGE) 1000 MG tablet TAKE 1 TABLET (1,000 MG TOTAL) BY MOUTH TWICE A DAY WITH FOOD 180 tablet 3   Multiple Vitamins-Minerals (CENTRUM SILVER 50+WOMEN PO) Take 1 tablet by mouth daily.     nitroGLYCERIN (NITROSTAT) 0.4 MG SL tablet For chest pain, tightness, or pressure. While sitting, place 1 tablet under tongue. May be used every 5 minutes as needed, for up to 15 minutes. Do not use more than 3 tablets. (Patient not taking: Reported on 08/30/2022) 25 tablet 3   nystatin cream (MYCOSTATIN) Apply topically as needed. (Patient not taking: Reported on 08/19/2022)     polyethylene glycol-electrolytes (NULYTELY) 420 g solution See admin instructions. (Patient not taking: Reported on 08/30/2022)     ReliOn Lancets Thin 26G MISC 1 Units by Does not apply route 2 (two) times daily. E 11.9 100 each 12   rosuvastatin (CRESTOR) 20 MG tablet Take 1 tablet (20 mg total) by mouth daily. 90 tablet 3   Semaglutide, 1 MG/DOSE, (OZEMPIC, 1 MG/DOSE,) 4 MG/3ML SOPN INJECT 1MG  INTO THE SKIN ONCE A WEEK 9 mL 3   sulfamethoxazole-trimethoprim (BACTRIM DS) 800-160 MG tablet Take 1 tablet by mouth 2 (two) times daily. (Patient not taking: Reported on 08/30/2022) 20 tablet 0   valsartan  (DIOVAN) 160 MG tablet Take 1 tablet (160 mg total) by mouth daily. 90 tablet 3   No current facility-administered medications on file prior to visit.   Allergies  Allergen Reactions   Penicillins Anaphylaxis and Hives    PATIENT HAD A PCN REACTION WITH IMMEDIATE RASH, FACIAL/TONGUE/THROAT SWELLING, SOB, OR LIGHTHEADEDNESS WITH HYPOTENSION:  #  #  #  YES  #  #  #    Has patient had a PCN reaction causing severe rash involving mucus membranes or skin necrosis:Unknown  Has patient had a PCN reaction that required hospitalization:No  Has patient had a PCN reaction occurring within the last 10 years:No  If all of the above answers are "NO", then may proceed with Cephalosporin use.  PATIENT HAD A PCN REACTION WITH IMMEDIATE RASH, FACIAL/TONGUE/THROAT SWELLING, SOB, OR LIGHTHEADEDNESS WITH HYPOTENSION:  #  #  #  YES  #  #  #   Has patient had a PCN reaction causing severe rash involving mucus membranes  or skin necrosis:UnknownHas patient had a PCN reaction that required hospitalization:No Has patient had a PCN reaction occurring within the last 10 years:No If all of the above answers are "NO", then may proceed with Cephalosporin use.   Coconut Flavor Swelling    Mouth breaks out    [place] Nucifera     Breaks out mouth   Other Swelling    Walnuts cause mouth to break out   Family History  Problem Relation Age of Onset   CAD Mother    CAD Father    Diabetes Brother    PE: BP (!) 150/80   Pulse 83   Ht 5' 4.25" (1.632 m)   Wt 177 lb 6.4 oz (80.5 kg)   SpO2 99%   BMI 30.21 kg/m  Wt Readings from Last 3 Encounters:  12/09/22 177 lb 6.4 oz (80.5 kg)  08/30/22 179 lb (81.2 kg)  08/19/22 175 lb 12.8 oz (79.7 kg)   Constitutional: overweight, in NAD Eyes: EOMI, no exophthalmos ENT: no thyromegaly, no cervical lymphadenopathy Cardiovascular: RRR, No RG, +2/6 SEM Respiratory: CTA B Musculoskeletal: no deformities Skin: no rashes Neurological: no tremor with outstretched  hands  ASSESSMENT: 1. DM2, insulin-dependent, uncontrolled, with long-term complications - CAD-s/p AMI, s/p stents, aortic valve stenosis - CKD  2. HL  3.  Obesity class I  PLAN:  1. Patient with longstanding, uncontrolled, type 2 diabetes, with improved control at last visit when HbA1c returned 6.2%.  Her blood sugar control improved after starting mealtime insulin and Ozempic.  She was not able to obtain a CGM due to cost previously, but may be able to obtain this while on Medicare.  We did not change her regimen at last visit as sugars appeared to be dramatically improved with only occasional higher values after dinner. -At today's visit, sugars are excellent in the morningbut they are slightly higher than goal later in the day, with occasional hyperglycemic spikes in the 200s.  She feels that these are due to a combination of forgetting to take the NovoLog or being too busy at work to take it and also dietary indiscretions.  We did discuss that she does need to take NovoLog before every meal and I advised her that for some meals including more carbs, she may need to increase the dose.  Otherwise, I recommended to continue the same regimen for now.  I did not recommend an increase in Ozempic dose as she does describe some nausea.  She does not feel that this is caused by Ozempic but would not want to exacerbate this by increasing the dose of the GLP-1 receptor agonist. - I suggested to:  Patient Instructions  Please continue: - Metformin 1000 mg 2x a day with meals - Lantus 50 units at bedtime - Ozempic 1 mg weekly  Try to change: - Novolog 10-16 units 15 min before meals  Please come back for a follow-up appointment in 4 months.  - we checked her HbA1c: 6.2% (stable, surprisingly) - advised to check sugars at different times of the day - 4x a day, rotating check times - advised for yearly eye exams >> she is UTD - return to clinic in 4  months  2. HL -Reviewed latest lipid panel  from 05/2022: LDL above our target of less than 55, HDL low, triglycerides high: Lab Results  Component Value Date   CHOL 146 06/16/2022   HDL 26 (L) 06/16/2022   LDLCALC 68 06/16/2022   LDLDIRECT 93 11/15/2012   TRIG 324 (  H) 06/16/2022   CHOLHDL 5.6 (H) 06/16/2022  -She continues Crestor 10 mg daily, fenofibrate 145 mg daily, Vascepa 2 g twice a day and Repatha.  No side effects.  3.  Obesity class I -She was not able to start initiated 2 inhibitor but was able to start Ozempic which should also help with weight loss -She lost 7 pounds before last visit and 1 pound since then  Carlus Pavlov, MD PhD [facility] Endocrinology

## 2022-12-09 NOTE — Telephone Encounter (Signed)
Cira Servant, Can you please explain?  Do you refer to the the patient - Finkler?  If so, the HbA1c appears to be stable, but I do not feel this is very accurate for her and going by the blood sugars, we still need slightly better control.    Pt feel like since her A1C  was at 6.2 she need to have her medication adjusted  after her knowing what her A1C is now. Pt wants to know if the changes were made base on today's  results or the  old results. Pt said that she isn't going to take any of her medication until she has been informed

## 2022-12-09 NOTE — Telephone Encounter (Signed)
Haley George, As mentioned above, I do not feel that her HbA1c is completely accurate for her.  There are some factors that can influence the HbA1c level besides blood glucose.  Going by her sugars that she brought to today's visit, these are higher than before so I would still recommend the insulin changes as discussed.

## 2022-12-22 DIAGNOSIS — M25552 Pain in left hip: Secondary | ICD-10-CM | POA: Diagnosis not present

## 2022-12-22 DIAGNOSIS — M25511 Pain in right shoulder: Secondary | ICD-10-CM | POA: Diagnosis not present

## 2022-12-31 DIAGNOSIS — M25511 Pain in right shoulder: Secondary | ICD-10-CM | POA: Diagnosis not present

## 2023-01-06 ENCOUNTER — Encounter: Payer: Self-pay | Admitting: Medical

## 2023-01-06 ENCOUNTER — Ambulatory Visit: Payer: Medicare PPO | Admitting: Medical

## 2023-01-06 VITALS — BP 134/70 | HR 86 | Temp 98.0°F | Wt 180.4 lb

## 2023-01-06 DIAGNOSIS — R300 Dysuria: Secondary | ICD-10-CM | POA: Diagnosis not present

## 2023-01-06 DIAGNOSIS — Z23 Encounter for immunization: Secondary | ICD-10-CM

## 2023-01-06 LAB — POCT URINALYSIS DIP (PROADVANTAGE DEVICE)
Bilirubin, UA: NEGATIVE
Blood, UA: NEGATIVE
Glucose, UA: NEGATIVE mg/dL
Ketones, POC UA: NEGATIVE mg/dL
Nitrite, UA: NEGATIVE
Protein Ur, POC: NEGATIVE mg/dL
Specific Gravity, Urine: 1.015
Urobilinogen, Ur: 0.2
pH, UA: 6 (ref 5.0–8.0)

## 2023-01-06 MED ORDER — NITROFURANTOIN MONOHYD MACRO 100 MG PO CAPS: 100.0000 mg | ORAL_CAPSULE | Freq: Two times a day (BID) | ORAL | 0 refills | Status: DC

## 2023-01-06 NOTE — Progress Notes (Signed)
Subjective:  Haley George is a 67 y.o. female who presents for Chief Complaint  Patient presents with   Urinary Tract Infection    Vaginal irritation, burning and frequent urination, smell not overwhelming. Takes a daily cranberry supplement and lots of water.    Immunizations    Would like some direction on shots and prices.      Here for possible UTI.  Has some burning and irration x 1 week.  No fever, no body aches or chills.  No abdominal pain, no back pain.  No blood in urine.  Water intake is good.  No vaginal discharge, +stinging with urination.    Has a bout of diarrhea last week, and sometimes get UTI after this.    No antibiotics in last 30 days.  No recent change in soap or hygiene products.     No recent travel.  No other aggravating or relieving factors.    No other c/o.  Past Medical History:  Diagnosis Date   Asthma    Cancer (HCC)    basal cell chest   Complication of anesthesia    Coronary artery disease    Diabetes mellitus without complication (HCC)    type 2   Heart murmur    Hepatitis    auto- immune   History of kidney stones    History of nuclear stress test 05/21/2010   dipyridamole; normal perfusion, preserved LV systolic EF of 66%   Hyperlipidemia    Hypertension    Liver failure (HCC)    h/o autoimmune hepatitis    Myocardial infarction (HCC)    Neuropathy    resolved with back surgery; tingling and burning left leg to foot   Obesity    PONV (postoperative nausea and vomiting)    Psoriasis    Tubular adenoma of colon    colon path 01/09/2017   Current Outpatient Medications on File Prior to Visit  Medication Sig Dispense Refill   ascorbic acid (VITAMIN C) 500 MG tablet Take 500 mg by mouth daily.     bisoprolol (ZEBETA) 10 MG tablet Take 0.5 Tablet (5 MG) by mouth in the morning. Take One tablet (10 MG) by mouth in the evening. 225 tablet 3   clopidogrel (PLAVIX) 75 MG tablet TAKE 1 TABLET BY MOUTH EVERY DAY WITH BREAKFAST 90 tablet 3    Coenzyme Q10 300 MG CAPS Take 1 capsule (300 mg total) by mouth daily. 90 capsule 3   Cranberry 300 MG tablet Take 300 mg by mouth every evening.     Evolocumab (REPATHA SURECLICK) 140 MG/ML SOAJ Inject 140 mg into the skin every 14 (fourteen) days. 6 mL 3   fenofibrate (TRICOR) 145 MG tablet Take 1 tablet (145 mg total) by mouth every evening. 90 tablet 3   ferrous sulfate 324 (65 Fe) MG TBEC Take 1 tablet (324 mg total) by mouth in the morning and at bedtime. 180 tablet 3   glucosamine-chondroitin 500-400 MG tablet Take 1 tablet by mouth daily. 90 tablet 3   glucose blood (ONETOUCH VERIO) test strip Use to check blood sugar 2 times a day 200 each 12   icosapent Ethyl (VASCEPA) 1 g capsule Take 2 capsules (2 g total) by mouth 2 (two) times daily. 260 capsule 3   insulin aspart (NOVOLOG FLEXPEN) 100 UNIT/ML FlexPen Inject 10-16 Units into the skin 3 (three) times daily before meals.     insulin glargine (LANTUS SOLOSTAR) 100 UNIT/ML Solostar Pen Inject 50 Units into the skin daily.  30 mL 3   Insulin Pen Needle (BD PEN NEEDLE NANO 2ND GEN) 32G X 4 MM MISC USE AS DIRECTED FOR INSULIN ADMINISTRATION 300 each 3   isosorbide mononitrate (IMDUR) 60 MG 24 hr tablet Take 1 tablet (60 mg total) by mouth daily. ** DO NOT CRUSH ** 90 tablet 1   metFORMIN (GLUCOPHAGE) 1000 MG tablet Take 1 tablet (1,000 mg total) by mouth 2 (two) times daily with a meal. 180 tablet 3   Multiple Vitamins-Minerals (CENTRUM SILVER 50+WOMEN PO) Take 1 tablet by mouth daily.     nystatin cream (MYCOSTATIN) Apply topically as needed.     ReliOn Lancets Thin 26G MISC 1 Units by Does not apply route 2 (two) times daily. E 11.9 100 each 12   rosuvastatin (CRESTOR) 20 MG tablet Take 1 tablet (20 mg total) by mouth daily. 90 tablet 3   Semaglutide, 1 MG/DOSE, (OZEMPIC, 1 MG/DOSE,) 4 MG/3ML SOPN INJECT 1MG  INTO THE SKIN ONCE A WEEK 9 mL 3   valsartan (DIOVAN) 160 MG tablet Take 1 tablet (160 mg total) by mouth daily. 90 tablet 3    calcipotriene (DOVONOX) 0.005 % ointment Apply topically daily in the afternoon. (Patient not taking: Reported on 01/06/2023)     fluticasone furoate-vilanterol (BREO ELLIPTA) 200-25 MCG/ACT AEPB Inhale 1 puff into the lungs daily. (Patient not taking: Reported on 01/06/2023) 3 each 3   furosemide (LASIX) 20 MG tablet Take 20 mg on days weight is 185 or greater (Patient not taking: Reported on 01/06/2023) 30 tablet 2   Magnesium Oxide 400 MG CAPS Take 1 capsule (400 mg total) by mouth in the morning and at bedtime. (Patient not taking: Reported on 01/06/2023) 180 capsule 3   meclizine (ANTIVERT) 25 MG tablet Take 1 tablet (25 mg total) by mouth 3 (three) times daily as needed for dizziness. (Patient not taking: Reported on 01/06/2023) 30 tablet 1   nitroGLYCERIN (NITROSTAT) 0.4 MG SL tablet For chest pain, tightness, or pressure. While sitting, place 1 tablet under tongue. May be used every 5 minutes as needed, for up to 15 minutes. Do not use more than 3 tablets. (Patient not taking: Reported on 01/06/2023) 25 tablet 3   polyethylene glycol-electrolytes (NULYTELY) 420 g solution See admin instructions. (Patient not taking: Reported on 01/06/2023)     sulfamethoxazole-trimethoprim (BACTRIM DS) 800-160 MG tablet Take 1 tablet by mouth 2 (two) times daily. (Patient not taking: Reported on 01/06/2023) 20 tablet 0   No current facility-administered medications on file prior to visit.     The following portions of the patient's history were reviewed and updated as appropriate: allergies, current medications, past family history, past medical history, past social history, past surgical history and problem list.  ROS Otherwise as in subjective above  Objective: BP 134/70   Pulse 86   Temp 98 F (36.7 C)   Wt 180 lb 6.4 oz (81.8 kg)   SpO2 97%   BMI 30.73 kg/m   General appearance: alert, no distress, well developed, well nourished Back nontender Declines GU exam    Assessment: Encounter Diagnoses   Name Primary?   Dysuria Yes   Need for influenza vaccination    Need for pneumococcal 20-valent conjugate vaccination      Plan: Dysuria -discussed symptoms and concerns.  I saw her in May 2024 for similar symptoms.  Urine culture sent today.  Urinalysis unremarkable today except for some leukocytes.  We discussed if culture ends up being negative that she needs to come back  in do further evaluation.  She is scheduled in October to see her PCP here for physical and pelvic exam at that time.  We discussed the possibility atrophic vaginitis or other causes of dysuria type symptoms.  Over the weekend if any worse symptoms suggesting UTI did begin Macrobid antibiotic sent today.  Otherwise await culture   Counseled on the influenza virus vaccine.  Vaccine information sheet given.  Influenza vaccine given after consent obtained.  Counseled on the pneumococcal vaccine.  Vaccine information sheet given.  Pneumococcal vaccine given after consent obtained.    Haley George was seen today for urinary tract infection and immunizations.  Diagnoses and all orders for this visit:  Dysuria -     Urine Culture -     POCT Urinalysis DIP (Proadvantage Device)  Need for influenza vaccination -     Flu Vaccine Trivalent High Dose (Fluad)  Need for pneumococcal 20-valent conjugate vaccination -     Pneumococcal conjugate vaccine 20-valent (Prevnar 20)  Other orders -     nitrofurantoin, macrocrystal-monohydrate, (MACROBID) 100 MG capsule; Take 1 capsule (100 mg total) by mouth 2 (two) times daily.    Follow up: pending culture

## 2023-01-09 ENCOUNTER — Ambulatory Visit: Payer: Medicare PPO | Admitting: Cardiology

## 2023-01-10 DIAGNOSIS — M25511 Pain in right shoulder: Secondary | ICD-10-CM | POA: Diagnosis not present

## 2023-01-10 NOTE — Progress Notes (Signed)
The preliminary urine culture results show infection.  Use the antibiotic Macrobid.  We will call when final culture results are available in the next few days.

## 2023-01-11 ENCOUNTER — Telehealth: Payer: Self-pay

## 2023-01-11 DIAGNOSIS — M25511 Pain in right shoulder: Secondary | ICD-10-CM | POA: Diagnosis not present

## 2023-01-11 LAB — URINE CULTURE

## 2023-01-11 NOTE — Progress Notes (Signed)
The urine culture was positive for infection, and the bacteria is sensitive to macrobid.   As long as you are improving, finish the antibiotic as it should work.  If not improving, let me know.

## 2023-01-11 NOTE — Telephone Encounter (Signed)
Pre-operative Risk Assessment    Patient Name: MONNA SHIELS  DOB: [birth year] MRN: [id number]     Request for Surgical Clearance    Procedure:  Right Reverse total shoulder replacement   Date of Surgery:  Clearance TBD                                 Surgeon:  Dr. Teryl Lucy Surgeon's Group or Practice Name:  [facility]  Phone number:  [phone] Fax number:  [phone]   Type of Clearance Requested:   - Pharmacy:  Hold Clopidogrel (Plavix)     Type of Anesthesia:  General    Additional requests/questions:    Scarlette Shorts   01/11/2023, 4:34 PM

## 2023-01-12 NOTE — Telephone Encounter (Signed)
Low risk with shoulder surgery.  Okay to hold clopidogrel for 5 days.  Can take aspirin instead of, if that is okay with the surgeon.  Important not to hold her beta-blocker bisoprolol perioperatively.

## 2023-01-12 NOTE — Telephone Encounter (Signed)
Hi Dr. Royann Shivers, Pre-op has received a request to hold Plavix for Ms. Goehring's right reverse total shoulder  replacement. She has history of CAD requiring PCI in all 3 major coronary arteries including redo PCI for restenosis.  Most recent intervention was in August 2021 with placement of 2 drug-eluting stents in the mid-distal left circumflex coronary artery for both de novo lesion and distal in-stent restenosis (small non-STEMI with peak troponin 1800). You have previously recommended she hold Clopidogrel 5 days prior for a colonoscopy, given history would like to confirm your recommendation for this procedure. Her current medication list does not list aspirin, do you want her to take aspirin perioperatively if surgeon feels bleeding risk is low?  Thank you, Reather Littler, NP

## 2023-01-12 NOTE — Telephone Encounter (Signed)
Name: Haley George  DOB: May 02, 1955  MRN: [id number]  Primary Cardiologist: Thurmon Fair, MD   Preoperative team, please contact this patient and set up a phone call appointment for further preoperative risk assessment. Please obtain consent and complete medication review. Thank you for your help.  I confirm that guidance regarding antiplatelet and oral anticoagulation therapy has been completed and, if necessary, noted below.  Per Dr. Royann Shivers: "Low risk with shoulder surgery.  Okay to hold clopidogrel for 5 days.  Can take aspirin instead of, if that is okay with the surgeon.  Important not to hold her beta-blocker bisoprolol perioperatively."   Carlos Levering, NP 01/12/2023, 3:44 PM [facility]

## 2023-01-13 ENCOUNTER — Telehealth: Payer: Self-pay

## 2023-01-13 NOTE — Telephone Encounter (Signed)
  Patient Consent for Virtual Visit        Haley George has provided verbal consent on 01/13/2023 for a virtual visit (video or telephone).   CONSENT FOR VIRTUAL VISIT FOR:  Haley George  By participating in this virtual visit I agree to the following:  I hereby voluntarily request, consent and authorize [facility] and its employed or contracted physicians, physician assistants, nurse practitioners or other licensed health care professionals (the Practitioner), to provide me with telemedicine health care services (the "Services") as deemed necessary by the treating Practitioner. I acknowledge and consent to receive the Services by the Practitioner via telemedicine. I understand that the telemedicine visit will involve communicating with the Practitioner through live audiovisual communication technology and the disclosure of certain medical information by electronic transmission. I acknowledge that I have been given the opportunity to request an in-person assessment or other available alternative prior to the telemedicine visit and am voluntarily participating in the telemedicine visit.  I understand that I have the right to withhold or withdraw my consent to the use of telemedicine in the course of my care at any time, without affecting my right to future care or treatment, and that the Practitioner or I may terminate the telemedicine visit at any time. I understand that I have the right to inspect all information obtained and/or recorded in the course of the telemedicine visit and may receive copies of available information for a reasonable fee.  I understand that some of the potential risks of receiving the Services via telemedicine include:  Delay or interruption in medical evaluation due to technological equipment failure or disruption; Information transmitted may not be sufficient (e.g. poor resolution of images) to allow for appropriate medical decision making by the Practitioner;  and/or  In rare instances, security protocols could fail, causing a breach of personal health information.  Furthermore, I acknowledge that it is my responsibility to provide information about my medical history, conditions and care that is complete and accurate to the best of my ability. I acknowledge that Practitioner's advice, recommendations, and/or decision may be based on factors not within their control, such as incomplete or inaccurate data provided by me or distortions of diagnostic images or specimens that may result from electronic transmissions. I understand that the practice of medicine is not an exact science and that Practitioner makes no warranties or guarantees regarding treatment outcomes. I acknowledge that a copy of this consent can be made available to me via my patient portal [facility] MyChart), or I can request a printed copy by calling the office of [facility].    I understand that my insurance will be billed for this visit.   I have read or had this consent read to me. I understand the contents of this consent, which adequately explains the benefits and risks of the Services being provided via telemedicine.  I have been provided ample opportunity to ask questions regarding this consent and the Services and have had my questions answered to my satisfaction. I give my informed consent for the services to be provided through the use of telemedicine in my medical care

## 2023-01-13 NOTE — Telephone Encounter (Signed)
Spoke with patient who is agreeable to do a tele visit on 10/14 at 9:20 am. Med rec and consent done.

## 2023-01-16 ENCOUNTER — Ambulatory Visit: Payer: Medicare PPO | Admitting: Medical

## 2023-01-16 VITALS — BP 110/68 | HR 73 | Temp 97.9°F | Wt 182.6 lb

## 2023-01-16 DIAGNOSIS — R5383 Other fatigue: Secondary | ICD-10-CM

## 2023-01-16 DIAGNOSIS — N390 Urinary tract infection, site not specified: Secondary | ICD-10-CM | POA: Diagnosis not present

## 2023-01-16 DIAGNOSIS — R300 Dysuria: Secondary | ICD-10-CM | POA: Diagnosis not present

## 2023-01-16 DIAGNOSIS — Z8639 Personal history of other endocrine, nutritional and metabolic disease: Secondary | ICD-10-CM

## 2023-01-16 LAB — POCT URINALYSIS DIP (PROADVANTAGE DEVICE)
Bilirubin, UA: NEGATIVE
Blood, UA: NEGATIVE
Glucose, UA: NEGATIVE mg/dL
Nitrite, UA: NEGATIVE
Protein Ur, POC: 30 mg/dL — AB
Specific Gravity, Urine: 1.025
Urobilinogen, Ur: NEGATIVE
pH, UA: 6 (ref 5.0–8.0)

## 2023-01-16 MED ORDER — CIPROFLOXACIN HCL 500 MG PO TABS: 500.0000 mg | ORAL_TABLET | Freq: Two times a day (BID) | ORAL | 0 refills | Status: AC

## 2023-01-16 NOTE — Progress Notes (Signed)
Subjective:  Haley George is a 67 y.o. female who presents for Chief Complaint  Patient presents with   Urinary Tract Infection    UTI- burning, feel more pain internal. Symptoms never went away from the orginial visit on 9/13. Only got 7 days of medication and ususlly has to have 10 days to get rid of anything     Here for not resolved on UTI.  I saw her 10 days ago for UTI symptoms.  10 days ago did course of Macrobid, was feeling better, but within 1.5 days of finishing the 7 days of macrobid, starting getting symptoms again over the weekend.  Has discomfort with urination, malaise, uncomfortable sensation in the urethra.  She also notes significant fatigue in the last few days more so than typical.  She has a history of iron deficiency anemia and had an iron infusion back in March.  She think she needs to have some of her labs updated  No chest pain, shortness of breath, rash, numbness or tingling or weakness.  No other aggravating or relieving factors.    No other c/o.  Past Medical History:  Diagnosis Date   Asthma    Cancer (HCC)    basal cell chest   Complication of anesthesia    Coronary artery disease    Diabetes mellitus without complication (HCC)    type 2   Heart murmur    Hepatitis    auto- immune   History of kidney stones    History of nuclear stress test 05/21/2010   dipyridamole; normal perfusion, preserved LV systolic EF of 66%   Hyperlipidemia    Hypertension    Liver failure (HCC)    h/o autoimmune hepatitis    Myocardial infarction (HCC)    Neuropathy    resolved with back surgery; tingling and burning left leg to foot   Obesity    PONV (postoperative nausea and vomiting)    Psoriasis    Tubular adenoma of colon    colon path 01/09/2017   Current Outpatient Medications on File Prior to Visit  Medication Sig Dispense Refill   ascorbic acid (VITAMIN C) 500 MG tablet Take 500 mg by mouth daily.     bisoprolol (ZEBETA) 10 MG tablet Take 0.5 Tablet  (5 MG) by mouth in the morning. Take One tablet (10 MG) by mouth in the evening. 225 tablet 3   calcipotriene (DOVONOX) 0.005 % ointment Apply topically daily in the afternoon.     clopidogrel (PLAVIX) 75 MG tablet TAKE 1 TABLET BY MOUTH EVERY DAY WITH BREAKFAST 90 tablet 3   Coenzyme Q10 300 MG CAPS Take 1 capsule (300 mg total) by mouth daily. 90 capsule 3   Cranberry 300 MG tablet Take 300 mg by mouth every evening.     Evolocumab (REPATHA SURECLICK) 140 MG/ML SOAJ Inject 140 mg into the skin every 14 (fourteen) days. 6 mL 3   fenofibrate (TRICOR) 145 MG tablet Take 1 tablet (145 mg total) by mouth every evening. 90 tablet 3   ferrous sulfate 324 (65 Fe) MG TBEC Take 1 tablet (324 mg total) by mouth in the morning and at bedtime. 180 tablet 3   fluticasone furoate-vilanterol (BREO ELLIPTA) 200-25 MCG/ACT AEPB Inhale 1 puff into the lungs daily. 3 each 3   furosemide (LASIX) 20 MG tablet Take 20 mg on days weight is 185 or greater 30 tablet 2   glucosamine-chondroitin 500-400 MG tablet Take 1 tablet by mouth daily. 90 tablet 3  glucose blood (ONETOUCH VERIO) test strip Use to check blood sugar 2 times a day 200 each 12   icosapent Ethyl (VASCEPA) 1 g capsule Take 2 capsules (2 g total) by mouth 2 (two) times daily. 260 capsule 3   insulin aspart (NOVOLOG FLEXPEN) 100 UNIT/ML FlexPen Inject 10-16 Units into the skin 3 (three) times daily before meals.     insulin glargine (LANTUS SOLOSTAR) 100 UNIT/ML Solostar Pen Inject 50 Units into the skin daily. 30 mL 3   Insulin Pen Needle (BD PEN NEEDLE NANO 2ND GEN) 32G X 4 MM MISC USE AS DIRECTED FOR INSULIN ADMINISTRATION 300 each 3   isosorbide mononitrate (IMDUR) 60 MG 24 hr tablet Take 1 tablet (60 mg total) by mouth daily. ** DO NOT CRUSH ** 90 tablet 1   Magnesium Oxide 400 MG CAPS Take 1 capsule (400 mg total) by mouth in the morning and at bedtime. 180 capsule 3   meclizine (ANTIVERT) 25 MG tablet Take 1 tablet (25 mg total) by mouth 3 (three)  times daily as needed for dizziness. 30 tablet 1   metFORMIN (GLUCOPHAGE) 1000 MG tablet Take 1 tablet (1,000 mg total) by mouth 2 (two) times daily with a meal. 180 tablet 3   Multiple Vitamins-Minerals (CENTRUM SILVER 50+WOMEN PO) Take 1 tablet by mouth daily.     nitroGLYCERIN (NITROSTAT) 0.4 MG SL tablet For chest pain, tightness, or pressure. While sitting, place 1 tablet under tongue. May be used every 5 minutes as needed, for up to 15 minutes. Do not use more than 3 tablets. 25 tablet 3   nystatin cream (MYCOSTATIN) Apply topically as needed.     polyethylene glycol-electrolytes (NULYTELY) 420 g solution See admin instructions.     ReliOn Lancets Thin 26G MISC 1 Units by Does not apply route 2 (two) times daily. E 11.9 100 each 12   rosuvastatin (CRESTOR) 20 MG tablet Take 1 tablet (20 mg total) by mouth daily. 90 tablet 3   Semaglutide, 1 MG/DOSE, (OZEMPIC, 1 MG/DOSE,) 4 MG/3ML SOPN INJECT 1MG  INTO THE SKIN ONCE A WEEK 9 mL 3   valsartan (DIOVAN) 160 MG tablet Take 1 tablet (160 mg total) by mouth daily. 90 tablet 3   No current facility-administered medications on file prior to visit.     The following portions of the patient's history were reviewed and updated as appropriate: allergies, current medications, past family history, past medical history, past social history, past surgical history and problem list.  ROS Otherwise as in subjective above   Objective: BP 110/68   Pulse 73   Temp 97.9 F (36.6 C)   Wt 182 lb 9.6 oz (82.8 kg)   BMI 31.10 kg/m   General appearance: alert, no distress, well developed, well nourished Neck: supple, no lymphadenopathy, no thyromegaly, no masses Heart: 2/6 holosystolic murmur in upper sternal borders, otherwise RRR, normal S1, S2 Lungs: CTA bilaterally, no wheezes, rhonchi, or rales Pulses: 2+ radial pulses, 2+ pedal pulses, normal cap refill Ext: no edema   Assessment: Encounter Diagnoses  Name Primary?   Dysuria Yes   Fatigue,  unspecified type    Urinary tract infection without hematuria, site unspecified    History of iron deficiency      Plan: Begin 3 days of Cipro given ongoing UTI symptoms.  Recent urine culture was reviewed which was positive for infection.  She already completed Macrobid 7-day pack  Fatigue-labs as below  If any recurrent urinary tract symptoms over the next month, may need  to see urology and have other evaluation   Blayne was seen today for urinary tract infection.  Diagnoses and all orders for this visit:  Dysuria -     POCT Urinalysis DIP (Proadvantage Device) -     CBC -     Iron, TIBC and Ferritin Panel -     Basic metabolic panel  Fatigue, unspecified type -     CBC -     Iron, TIBC and Ferritin Panel -     Basic metabolic panel  Urinary tract infection without hematuria, site unspecified -     CBC -     Iron, TIBC and Ferritin Panel -     Basic metabolic panel  History of iron deficiency  Other orders -     ciprofloxacin (CIPRO) 500 MG tablet; Take 1 tablet (500 mg total) by mouth 2 (two) times daily for 3 days.    Follow up: pending labs

## 2023-01-17 ENCOUNTER — Other Ambulatory Visit: Payer: Self-pay | Admitting: Medical

## 2023-01-17 DIAGNOSIS — E1159 Type 2 diabetes mellitus with other circulatory complications: Secondary | ICD-10-CM

## 2023-01-17 LAB — IRON,TIBC AND FERRITIN PANEL
Ferritin: 89 ng/mL (ref 15–150)
Iron Saturation: 27 % (ref 15–55)
Iron: 100 ug/dL (ref 27–139)
Total Iron Binding Capacity: 370 ug/dL (ref 250–450)
UIBC: 270 ug/dL (ref 118–369)

## 2023-01-17 LAB — CBC
Hematocrit: 35.6 % (ref 34.0–46.6)
Hemoglobin: 11.3 g/dL (ref 11.1–15.9)
MCH: 30.9 pg (ref 26.6–33.0)
MCHC: 31.7 g/dL (ref 31.5–35.7)
MCV: 97 fL (ref 79–97)
Platelets: 273 10*3/uL (ref 150–450)
RBC: 3.66 x10E6/uL — ABNORMAL LOW (ref 3.77–5.28)
RDW: 11.7 % (ref 11.7–15.4)
WBC: 5.8 10*3/uL (ref 3.4–10.8)

## 2023-01-17 LAB — BASIC METABOLIC PANEL
BUN/Creatinine Ratio: 22 (ref 12–28)
BUN: 34 mg/dL — ABNORMAL HIGH (ref 8–27)
CO2: 20 mmol/L (ref 20–29)
Calcium: 9.5 mg/dL (ref 8.7–10.3)
Chloride: 106 mmol/L (ref 96–106)
Creatinine, Ser: 1.54 mg/dL — ABNORMAL HIGH (ref 0.57–1.00)
Glucose: 208 mg/dL — ABNORMAL HIGH (ref 70–99)
Potassium: 5.4 mmol/L — ABNORMAL HIGH (ref 3.5–5.2)
Sodium: 141 mmol/L (ref 134–144)
eGFR: 37 mL/min/{1.73_m2} — ABNORMAL LOW (ref >59)

## 2023-01-17 MED ORDER — VALSARTAN 160 MG PO TABS
80.0000 mg | ORAL_TABLET | Freq: Every day | ORAL | 0 refills | Status: DC
Start: 2023-01-17 — End: 2023-03-21

## 2023-01-17 NOTE — Progress Notes (Signed)
Hemoglobin white cells normal, red cells slightly decreased.  Iron levels are okay.  Your potassium was little elevated, kidney marker relatively stable.  Blood sugar elevated  Recommendations: If you are taking any extra potassium then stop this If not, then I want you to cut your valsartan in half, just do 80 mg tablet daily instead of 160 mg Make sure you are drinking 80 to 100 ounces of water daily I would plan to repeat a electrolyte panel and kidney marker in 1 week after making these changes.  This could be a nurse visit

## 2023-01-24 ENCOUNTER — Other Ambulatory Visit (INDEPENDENT_AMBULATORY_CARE_PROVIDER_SITE_OTHER): Payer: Medicare PPO

## 2023-01-24 DIAGNOSIS — I152 Hypertension secondary to endocrine disorders: Secondary | ICD-10-CM | POA: Diagnosis not present

## 2023-01-24 DIAGNOSIS — R300 Dysuria: Secondary | ICD-10-CM

## 2023-01-24 DIAGNOSIS — E1159 Type 2 diabetes mellitus with other circulatory complications: Secondary | ICD-10-CM

## 2023-01-24 LAB — POCT URINALYSIS DIP (PROADVANTAGE DEVICE)
Bilirubin, UA: NEGATIVE
Blood, UA: NEGATIVE
Glucose, UA: NEGATIVE mg/dL
Ketones, POC UA: NEGATIVE mg/dL
Leukocytes, UA: NEGATIVE
Nitrite, UA: NEGATIVE
Protein Ur, POC: NEGATIVE mg/dL
Specific Gravity, Urine: 1.02
Urobilinogen, Ur: 0.2
pH, UA: 6 (ref 5.0–8.0)

## 2023-01-24 LAB — BASIC METABOLIC PANEL
BUN/Creatinine Ratio: 17 (ref 12–28)
BUN: 22 mg/dL (ref 8–27)
CO2: 22 mmol/L (ref 20–29)
Calcium: 9.5 mg/dL (ref 8.7–10.3)
Chloride: 105 mmol/L (ref 96–106)
Creatinine, Ser: 1.3 mg/dL — ABNORMAL HIGH (ref 0.57–1.00)
Glucose: 103 mg/dL — ABNORMAL HIGH (ref 70–99)
Potassium: 4.8 mmol/L (ref 3.5–5.2)
Sodium: 142 mmol/L (ref 134–144)
eGFR: 46 mL/min/{1.73_m2} — ABNORMAL LOW (ref >59)

## 2023-01-25 NOTE — Progress Notes (Signed)
Kidney marker back to baseline.  Resume medicines as usual

## 2023-01-30 NOTE — Telephone Encounter (Signed)
  Pt called and cancelled her televisit. She said, she cancelled her procedure with Dr. Dion Saucier

## 2023-01-30 NOTE — Telephone Encounter (Signed)
I will update all parties involved the pt called our office to update that she is not having her procedure and has cancelled her tele visit for pre op clearance.

## 2023-02-06 ENCOUNTER — Ambulatory Visit: Payer: Medicare PPO

## 2023-02-18 ENCOUNTER — Other Ambulatory Visit: Payer: Self-pay | Admitting: Cardiovascular Disease

## 2023-03-15 DIAGNOSIS — Z1231 Encounter for screening mammogram for malignant neoplasm of breast: Secondary | ICD-10-CM | POA: Diagnosis not present

## 2023-03-21 ENCOUNTER — Ambulatory Visit: Payer: Medicare PPO | Admitting: Family Medicine

## 2023-03-21 ENCOUNTER — Other Ambulatory Visit (HOSPITAL_COMMUNITY)
Admission: RE | Admit: 2023-03-21 | Discharge: 2023-03-21 | Disposition: A | Discharge status: Home / Self Care | Payer: Medicare PPO | Source: Ambulatory Visit | Attending: Family Medicine | Admitting: Family Medicine

## 2023-03-21 ENCOUNTER — Encounter: Payer: Self-pay | Admitting: Family Medicine

## 2023-03-21 VITALS — BP 180/90 | HR 82 | Ht 64.0 in | Wt 179.6 lb

## 2023-03-21 DIAGNOSIS — I152 Hypertension secondary to endocrine disorders: Secondary | ICD-10-CM

## 2023-03-21 DIAGNOSIS — N1832 Chronic kidney disease, stage 3b: Secondary | ICD-10-CM

## 2023-03-21 DIAGNOSIS — Z124 Encounter for screening for malignant neoplasm of cervix: Secondary | ICD-10-CM | POA: Insufficient documentation

## 2023-03-21 DIAGNOSIS — E1169 Type 2 diabetes mellitus with other specified complication: Secondary | ICD-10-CM

## 2023-03-21 DIAGNOSIS — E119 Type 2 diabetes mellitus without complications: Secondary | ICD-10-CM

## 2023-03-21 DIAGNOSIS — E66811 Obesity, class 1: Secondary | ICD-10-CM | POA: Diagnosis not present

## 2023-03-21 DIAGNOSIS — H8110 Benign paroxysmal vertigo, unspecified ear: Secondary | ICD-10-CM

## 2023-03-21 DIAGNOSIS — Z860100 Personal history of colon polyps, unspecified: Secondary | ICD-10-CM | POA: Diagnosis not present

## 2023-03-21 DIAGNOSIS — R300 Dysuria: Secondary | ICD-10-CM | POA: Diagnosis not present

## 2023-03-21 DIAGNOSIS — E6609 Other obesity due to excess calories: Secondary | ICD-10-CM

## 2023-03-21 DIAGNOSIS — E785 Hyperlipidemia, unspecified: Secondary | ICD-10-CM | POA: Diagnosis not present

## 2023-03-21 DIAGNOSIS — H9313 Tinnitus, bilateral: Secondary | ICD-10-CM

## 2023-03-21 DIAGNOSIS — D500 Iron deficiency anemia secondary to blood loss (chronic): Secondary | ICD-10-CM

## 2023-03-21 DIAGNOSIS — I5032 Chronic diastolic (congestive) heart failure: Secondary | ICD-10-CM | POA: Diagnosis not present

## 2023-03-21 DIAGNOSIS — Z6834 Body mass index (BMI) 34.0-34.9, adult: Secondary | ICD-10-CM

## 2023-03-21 DIAGNOSIS — J4530 Mild persistent asthma, uncomplicated: Secondary | ICD-10-CM

## 2023-03-21 DIAGNOSIS — N3000 Acute cystitis without hematuria: Secondary | ICD-10-CM

## 2023-03-21 DIAGNOSIS — I25118 Atherosclerotic heart disease of native coronary artery with other forms of angina pectoris: Secondary | ICD-10-CM

## 2023-03-21 DIAGNOSIS — R350 Frequency of micturition: Secondary | ICD-10-CM

## 2023-03-21 DIAGNOSIS — E1159 Type 2 diabetes mellitus with other circulatory complications: Secondary | ICD-10-CM

## 2023-03-21 DIAGNOSIS — I2510 Atherosclerotic heart disease of native coronary artery without angina pectoris: Secondary | ICD-10-CM

## 2023-03-21 DIAGNOSIS — D649 Anemia, unspecified: Secondary | ICD-10-CM

## 2023-03-21 DIAGNOSIS — N3946 Mixed incontinence: Secondary | ICD-10-CM

## 2023-03-21 DIAGNOSIS — I10 Essential (primary) hypertension: Secondary | ICD-10-CM

## 2023-03-21 DIAGNOSIS — D369 Benign neoplasm, unspecified site: Secondary | ICD-10-CM

## 2023-03-21 LAB — POCT UA - MICROALBUMIN
Albumin/Creatinine Ratio, Urine, POC: 38.8
Creatinine, POC: 102.2 mg/dL
Microalbumin Ur, POC: 39.7 mg/L

## 2023-03-21 LAB — POCT URINALYSIS DIP (PROADVANTAGE DEVICE)
Bilirubin, UA: NEGATIVE
Blood, UA: NEGATIVE
Glucose, UA: NEGATIVE mg/dL
Ketones, POC UA: NEGATIVE mg/dL
Nitrite, UA: NEGATIVE
Protein Ur, POC: NEGATIVE mg/dL
Specific Gravity, Urine: 1.015
Urobilinogen, Ur: 0.2
pH, UA: 6 (ref 5.0–8.0)

## 2023-03-21 MED ORDER — VALSARTAN 160 MG PO TABS
80.0000 mg | ORAL_TABLET | Freq: Every day | ORAL | 3 refills | Status: DC
Start: 2023-03-21 — End: 2023-05-18

## 2023-03-21 MED ORDER — SULFAMETHOXAZOLE-TRIMETHOPRIM 800-160 MG PO TABS: 1.0000 | ORAL_TABLET | Freq: Two times a day (BID) | ORAL | 0 refills | Status: DC

## 2023-03-21 NOTE — Addendum Note (Signed)
Addended byLuciana Axe, Ephraim Reichel D on: 03/21/2023 11:35 AM   Modules accepted: Orders

## 2023-03-21 NOTE — Patient Instructions (Signed)
  Ms. Agent , Thank you for taking time to come for your Medicare Wellness Visit. I appreciate your ongoing commitment to your health goals. Please review the following plan we discussed and let me know if I can assist you in the future.  Try Rhinocort nasal spray for your allergies and if that is that enough then you can go with either Allegra, Zyrtec or Claritin see the cardiologist breathing is okay stomach is okay we will do the Pap will get that done These are the goals we discussed: This is a list of the screening recommended for you and due dates:  Health Maintenance  Topic Date Due   Yearly kidney health urinalysis for diabetes  03/13/2018   Eye exam for diabetics  04/07/2022   Hemoglobin A1C  01/21/2023   Pap with HPV screening  01/28/2023   COVID-19 Vaccine (6 - 2023-24 season) 04/06/2023*   Complete foot exam   07/21/2023   Yearly kidney function blood test for diabetes  01/24/2024   Mammogram  03/10/2024   Medicare Annual Wellness Visit  03/20/2024   DTaP/Tdap/Td vaccine (5 - Td or Tdap) 07/13/2026   Colon Cancer Screening  09/07/2032   Pneumonia Vaccine  Completed   Flu Shot  Completed   DEXA scan (bone density measurement)  Completed   Hepatitis C Screening  Completed   HIV Screening  Completed   Zoster (Shingles) Vaccine  Completed   HPV Vaccine  Aged Out  *Topic was postponed. The date shown is not the original due date.

## 2023-03-21 NOTE — Progress Notes (Signed)
Haley George is a 67 y.o. female who presents for welcome to Medicare visit and follow-up on chronic medical conditions.  She is followed regularly by cardiology as well as endocrinology.  They are controlling most of the medications she is on.  She is supposed be on Breo but is not taking this but does now complaint of difficulty with coughing.  She also is having some frequency and dysuria.  Record in case need for Pap.  She does have evidence of colonic polyp and is scheduled for routine follow-up on that.  She also is having a lot of shoulder discomfort on the right and is apparently in need of a replacement.  Allergies seem to be under good control.  Her husband died several years ago and she seems to be handling this well and is involved in a support group.  Her medications were reviewed and she continues on appropriate diabetes as well as cardiac medications.  Immunizations and Health Maintenance Immunization History  Administered Date(s) Administered   DTaP 08/23/1991, 11/07/2001   Fluad Quad(high Dose 65+) 03/10/2021   Fluad Trivalent(High Dose 65+) 01/06/2023   Hepatitis B 02/24/1999, 04/14/1999, 07/25/1999   Influenza Split 01/19/2013   Influenza,inj,Quad PF,6+ Mos 01/13/2014, 02/10/2015, 01/30/2017, 12/19/2017, 01/29/2019, 01/28/2020, 03/10/2022   Influenza-Unspecified 02/22/2016   PFIZER Comirnaty(Gray Top)Covid-19 Tri-Sucrose Vaccine 08/13/2020   PFIZER(Purple Top)SARS-COV-2 Vaccination 07/11/2019, 08/05/2019, 01/28/2020   PNEUMOCOCCAL CONJUGATE-20 01/06/2023   PPD Test 11/17/1993   Pfizer Covid-19 Vaccine Bivalent Booster 31yrs & up 03/10/2021   Pneumococcal Conjugate-13 07/12/2016   Pneumococcal Polysaccharide-23 06/03/1998   Respiratory Syncytial Virus Vaccine,Recomb Aduvanted(Arexvy) 02/19/2023   Tdap 09/15/2006, 07/12/2016   Zoster Recombinant(Shingrix) 12/19/2017, 03/27/2018   Health Maintenance Due  Topic Date Due   OPHTHALMOLOGY EXAM  04/07/2022   HEMOGLOBIN A1C   01/21/2023   Cervical Cancer Screening (HPV/Pap Cotest)  01/28/2023    Last Pap smear:01/28/20 Last mammogram: last week at [facility] Last colonoscopy:09/08/22 Dr Ewing Schlein Last DEXA:03/11/2019 thinks she had one in 2022 also Dentist:9/24 Ophtho:12/23, scheduled for 04/25/23 Exercise:none  Other doctors caring for patient include: GI:Dr. Magod Cards:Dr Croituro Optho: Dr. Dione Booze Dentist: Dr. Lysbeth Penner Otho:Dr. Dion Saucier Derm: Dr. Emily Filbert Endo: Dr. Elvera Lennox   Advanced directives:yes Does Patient Have a Medical Advance Directive?: Yes Type of Advance Directive: Healthcare Power of Attorney, Living will Does patient want to make changes to medical advance directive?: No - Patient declined Copy of Healthcare Power of Attorney in Chart?: No - copy requested  Depression screen:  See questionnaire below.     03/21/2023    8:18 AM 08/17/2022    8:15 AM 05/05/2022   10:08 AM 01/28/2021    8:32 AM 01/28/2020    2:09 PM  Depression screen PHQ 2/9  Decreased Interest 0 0 3 0 0  Down, Depressed, Hopeless 0 1 3 0 3  PHQ - 2 Score 0 1 6 0 3  Altered sleeping  1 1  2   Tired, decreased energy  1 0  0  Change in appetite  0 0  3  Feeling bad or failure about yourself   0 0  0  Trouble concentrating  0 0  0  Moving slowly or fidgety/restless  0 0  0  Suicidal thoughts  0 0  0  PHQ-9 Score  3 7  8   Difficult doing work/chores  Not difficult at all Not difficult at all  Very difficult    Fall Risk Screen: see questionnaire below.    03/21/2023    8:18 AM  08/17/2022    8:09 AM 07/12/2016    2:06 PM 05/06/2014    9:23 AM  Fall Risk   Falls in the past year? 0 0 Yes No  Comment   Sep 23 2015 broke ankel   Number falls in past yr: 0 0 1   Injury with Fall? 0 0 Yes   Risk for fall due to : No Fall Risks No Fall Risks Other (Comment)    Risk for fall due to: Comment   pt rolled her ankle and fell    Follow up Falls evaluation completed Falls evaluation completed Follow up appointment      Significant  value    ADL screen:  See questionnaire below Functional Status Survey: Is the patient deaf or have difficulty hearing?: Yes Does the patient have difficulty seeing, even when wearing glasses/contacts?: No Does the patient have difficulty concentrating, remembering, or making decisions?: No Does the patient have difficulty walking or climbing stairs?: No Does the patient have difficulty dressing or bathing?: No Does the patient have difficulty doing errands alone such as visiting a doctor's office or shopping?: No   Review of Systems Constitutional: -, -unexpected weight change, -anorexia, -fatigue Allergy: -sneezing, -itching, -congestion Dermatology: denies changing moles, rash, lumps ENT: -runny nose, -ear pain, -sore throat,  Cardiology:  -chest pain, -palpitations, -orthopnea, Respiratory: -cough, -shortness of breath, -dyspnea on exertion, -wheezing,  Gastroenterology: -abdominal pain, -nausea, -vomiting, -diarrhea, -constipation, -dysphagia Hematology: -bleeding or bruising problems Musculoskeletal: -arthralgias, -myalgias, -joint swelling, -back pain, - Ophthalmology: -vision changes,  Urology: -dysuria, -difficulty urinating,  -urinary frequency, -urgency, incontinence Neurology: -, -numbness, , -memory loss, -falls, -dizziness    PHYSICAL EXAM:  General Appearance: Alert, cooperative, no distress, appears stated age Head: Normocephalic, without obvious abnormality, atraumatic Eyes: PERRL, conjunctiva/corneas clear, EOM's intact,  Ears: Normal TM's and external ear canals Nose: Nares normal, mucosa normal, no drainage or sinus tenderness Throat: Lips, mucosa, and tongue normal; teeth and gums normal Neck: Supple, no lymphadenopathy;  thyroid:  no enlargement/tenderness/nodules; no carotid bruit or JVD Lungs: Expiratory wheezing noted, respirations unlabored Heart: Regular rate and rhythm, S1 and S2 normal, no murmur, rubor gallop Abdomen: Soft, non-tender,  nondistended, normoactive bowel sounds,  no masses, no hepatosplenomegaly Extremities: No clubbing, cyanosis or edema Pulses: 2+ and symmetric all extremities Skin:  Skin color, texture, turgor normal, no rashes or lesions Lymph nodes: Cervical, supraclavicular, and axillary nodes normal Neurologic:  CNII-XII intact, normal strength, sensation and gait; reflexes 2+ and symmetric throughout Psych: Normal mood, affect, hygiene and grooming. Urine microscopic exam does show scattered white cell clumps.  No bacteria noted. Pelvic exam done. ASSESSMENT/PLAN: ASHD (arteriosclerotic heart disease) - Plan: Comprehensive metabolic panel, CBC with Differential/Platelet, Lipid panel, Magnesium  Benign paroxysmal positional vertigo, unspecified laterality  Chronic diastolic heart failure (HCC)  Class 1 obesity due to excess calories with serious comorbidity and body mass index (BMI) of 34.0 to 34.9 in adult  Coronary artery disease of native artery of native heart with stable angina pectoris (HCC)  History of colonic polyps  Hyperlipidemia associated with type 2 diabetes mellitus (HCC) - Plan: Lipid panel  Insulin-requiring or dependent type II diabetes mellitus (HCC) - Plan: POCT UA - Microalbumin  Iron deficiency anemia due to chronic blood loss - Plan: Iron, TIBC and Ferritin Panel  Mild persistent asthma without complication  Mixed stress and urge urinary incontinence  Normocytic anemia  Stage 3b chronic kidney disease (HCC)  Tinnitus of both ears  Urinary frequency - Plan: POCT Urinalysis  DIP (Proadvantage Device)  Hypertension associated with diabetes (HCC) - Plan: valsartan (DIOVAN) 160 MG tablet  Screening for cervical cancer - Plan: Cytology - PAP([facility])  Dysuria - Plan: CULTURE, URINE COMPREHENSIVE, sulfamethoxazole-trimethoprim (BACTRIM DS) 800-160 MG tablet, Urine Culture  Acute cystitis without hematuria - Plan: sulfamethoxazole-trimethoprim (BACTRIM DS) 800-160  MG tablet, Urine Culture She will follow-up with endocrinology as well as cardiology.  Recommend she start taking the [facility] again.  I will do a culture on her urine and place her on Septra.  Follow-up with GI concerning the adenomatous polyp.     Immunization recommendations discussed.  Colonoscopy recommendations reviewed   Medicare Attestation I have personally reviewed: The patient's medical and social history Their use of alcohol, tobacco or illicit drugs Their current medications and supplements The patient's functional ability including ADLs,fall risks, home safety risks, cognitive, and hearing and visual impairment Diet and physical activities Evidence for depression or mood disorders  The patient's weight, height, and BMI have been recorded in the chart.  I have made referrals, counseling, and provided education to the patient based on review of the above and I have provided the patient with a written personalized care plan for preventive services.     Sharlot Gowda, MD   03/21/2023

## 2023-03-22 LAB — CBC WITH DIFFERENTIAL/PLATELET
Basophils Absolute: 0 10*3/uL (ref 0.0–0.2)
Basos: 1 %
EOS (ABSOLUTE): 0.7 10*3/uL — ABNORMAL HIGH (ref 0.0–0.4)
Eos: 13 %
Hematocrit: 35.9 % (ref 34.0–46.6)
Hemoglobin: 11.3 g/dL (ref 11.1–15.9)
Immature Grans (Abs): 0 10*3/uL (ref 0.0–0.1)
Immature Granulocytes: 0 %
Lymphocytes Absolute: 1.3 10*3/uL (ref 0.7–3.1)
Lymphs: 24 %
MCH: 30 pg (ref 26.6–33.0)
MCHC: 31.5 g/dL (ref 31.5–35.7)
MCV: 95 fL (ref 79–97)
Monocytes Absolute: 0.5 10*3/uL (ref 0.1–0.9)
Monocytes: 9 %
Neutrophils Absolute: 2.8 10*3/uL (ref 1.4–7.0)
Neutrophils: 53 %
Platelets: 250 10*3/uL (ref 150–450)
RBC: 3.77 x10E6/uL (ref 3.77–5.28)
RDW: 11.8 % (ref 11.7–15.4)
WBC: 5.2 10*3/uL (ref 3.4–10.8)

## 2023-03-22 LAB — IRON,TIBC AND FERRITIN PANEL
Ferritin: 59 ng/mL (ref 15–150)
Iron Saturation: 25 % (ref 15–55)
Iron: 92 ug/dL (ref 27–139)
Total Iron Binding Capacity: 369 ug/dL (ref 250–450)
UIBC: 277 ug/dL (ref 118–369)

## 2023-03-22 LAB — COMPREHENSIVE METABOLIC PANEL
ALT: 21 [IU]/L (ref 0–32)
AST: 32 [IU]/L (ref 0–40)
Albumin: 4.1 g/dL (ref 3.9–4.9)
Alkaline Phosphatase: 35 [IU]/L — ABNORMAL LOW (ref 44–121)
BUN/Creatinine Ratio: 19 (ref 12–28)
BUN: 28 mg/dL — ABNORMAL HIGH (ref 8–27)
Bilirubin Total: 0.3 mg/dL (ref 0.0–1.2)
CO2: 19 mmol/L — ABNORMAL LOW (ref 20–29)
Calcium: 9.3 mg/dL (ref 8.7–10.3)
Chloride: 109 mmol/L — ABNORMAL HIGH (ref 96–106)
Creatinine, Ser: 1.47 mg/dL — ABNORMAL HIGH (ref 0.57–1.00)
Globulin, Total: 2.7 g/dL (ref 1.5–4.5)
Glucose: 111 mg/dL — ABNORMAL HIGH (ref 70–99)
Potassium: 4.9 mmol/L (ref 3.5–5.2)
Sodium: 140 mmol/L (ref 134–144)
Total Protein: 6.8 g/dL (ref 6.0–8.5)
eGFR: 39 mL/min/{1.73_m2} — ABNORMAL LOW (ref >59)

## 2023-03-22 LAB — LIPID PANEL
Chol/HDL Ratio: 2.5 {ratio} (ref 0.0–4.4)
Cholesterol, Total: 53 mg/dL — ABNORMAL LOW (ref 100–199)
HDL: 21 mg/dL — ABNORMAL LOW (ref >39)
LDL Chol Calc (NIH): 2 mg/dL (ref 0–99)
Triglycerides: 191 mg/dL — ABNORMAL HIGH (ref 0–149)
VLDL Cholesterol Cal: 30 mg/dL (ref 5–40)

## 2023-03-22 LAB — CYTOLOGY - PAP: Diagnosis: NEGATIVE

## 2023-03-22 LAB — MAGNESIUM: Magnesium: 1.6 mg/dL (ref 1.6–2.3)

## 2023-03-25 LAB — URINE CULTURE

## 2023-04-03 ENCOUNTER — Other Ambulatory Visit: Payer: Self-pay | Admitting: Internal Medicine

## 2023-04-06 ENCOUNTER — Ambulatory Visit: Payer: Medicare PPO | Admitting: Internal Medicine

## 2023-04-06 ENCOUNTER — Encounter: Payer: Self-pay | Admitting: Internal Medicine

## 2023-04-06 VITALS — BP 128/70 | HR 82 | Ht 64.0 in | Wt 178.6 lb

## 2023-04-06 DIAGNOSIS — L821 Other seborrheic keratosis: Secondary | ICD-10-CM | POA: Diagnosis not present

## 2023-04-06 DIAGNOSIS — E785 Hyperlipidemia, unspecified: Secondary | ICD-10-CM | POA: Diagnosis not present

## 2023-04-06 DIAGNOSIS — E6609 Other obesity due to excess calories: Secondary | ICD-10-CM

## 2023-04-06 DIAGNOSIS — Z86018 Personal history of other benign neoplasm: Secondary | ICD-10-CM | POA: Diagnosis not present

## 2023-04-06 DIAGNOSIS — D225 Melanocytic nevi of trunk: Secondary | ICD-10-CM | POA: Diagnosis not present

## 2023-04-06 DIAGNOSIS — Z6834 Body mass index (BMI) 34.0-34.9, adult: Secondary | ICD-10-CM

## 2023-04-06 DIAGNOSIS — Z7984 Long term (current) use of oral hypoglycemic drugs: Secondary | ICD-10-CM | POA: Diagnosis not present

## 2023-04-06 DIAGNOSIS — Z794 Long term (current) use of insulin: Secondary | ICD-10-CM | POA: Diagnosis not present

## 2023-04-06 DIAGNOSIS — Z85828 Personal history of other malignant neoplasm of skin: Secondary | ICD-10-CM | POA: Diagnosis not present

## 2023-04-06 DIAGNOSIS — L570 Actinic keratosis: Secondary | ICD-10-CM | POA: Diagnosis not present

## 2023-04-06 DIAGNOSIS — L578 Other skin changes due to chronic exposure to nonionizing radiation: Secondary | ICD-10-CM | POA: Diagnosis not present

## 2023-04-06 DIAGNOSIS — E1159 Type 2 diabetes mellitus with other circulatory complications: Secondary | ICD-10-CM | POA: Diagnosis not present

## 2023-04-06 DIAGNOSIS — Z7985 Long-term (current) use of injectable non-insulin antidiabetic drugs: Secondary | ICD-10-CM

## 2023-04-06 DIAGNOSIS — L409 Psoriasis, unspecified: Secondary | ICD-10-CM | POA: Diagnosis not present

## 2023-04-06 DIAGNOSIS — E1169 Type 2 diabetes mellitus with other specified complication: Secondary | ICD-10-CM | POA: Diagnosis not present

## 2023-04-06 DIAGNOSIS — E66811 Obesity, class 1: Secondary | ICD-10-CM

## 2023-04-06 DIAGNOSIS — L304 Erythema intertrigo: Secondary | ICD-10-CM | POA: Diagnosis not present

## 2023-04-06 LAB — POCT GLYCOSYLATED HEMOGLOBIN (HGB A1C): Hemoglobin A1C: 6.1 % — AB (ref 4.0–5.6)

## 2023-04-06 MED ORDER — OZEMPIC (2 MG/DOSE) 8 MG/3ML ~~LOC~~ SOPN: 2.0000 mg | PEN_INJECTOR | SUBCUTANEOUS | 3 refills | Status: DC

## 2023-04-06 MED ORDER — OZEMPIC (1 MG/DOSE) 4 MG/3ML ~~LOC~~ SOPN: 1.0000 mg | PEN_INJECTOR | SUBCUTANEOUS | Status: DC

## 2023-04-06 MED ORDER — LANTUS SOLOSTAR 100 UNIT/ML ~~LOC~~ SOPN: 45.0000 [IU] | PEN_INJECTOR | Freq: Every day | SUBCUTANEOUS | Status: DC

## 2023-04-06 NOTE — Progress Notes (Signed)
Patient ID: Haley George, female   DOB: 1955/08/06, 67 y.o.   MRN: [id number]   HPI: Haley George is a 67 y.o.-year-old female, initially referred by her PCP, Dr. Susann Givens, returning for follow-up for DM2, dx initially in 1997 2/2 liver failure, then GDM, then re-occuring in 2007, insulin-dependent, uncontrolled, with long-term complications (CAD-s/p NSTEMI in 11/2019, s/p stents, aortic valve stenosis; CKD).  Last visit 4 mo ago.  Interim history: No increased urination, blurry vision, chest pain.   Her fridge is broken for last 7 weeks. Not cooking lately. She eats takeout, lighter meals. She is preparing to go in a trip to [place] and [place].  Reviewed HbA1c levels: Lab Results  Component Value Date   HGBA1C 6.2 12/09/2022   HGBA1C 6.2 (A) 07/21/2022   HGBA1C 9.1 (A) 04/28/2022   HGBA1C 7.9 (A) 12/14/2021   HGBA1C 6.9 (A) 08/12/2021   HGBA1C 7.0 (A) 01/28/2021   HGBA1C 6.3 (A) 12/08/2020   HGBA1C 7.4 (H) 08/20/2020   HGBA1C 7.0 (A) 07/28/2020   HGBA1C 6.2 (A) 01/28/2020  05/01/2019: HbA1c 6.2% She was on steroid injections in 2019.   Pt is on a regimen of: - Metformin 1000 mg 2x a day, with meals - Levemir 140 (70 units x2) units at bedtime >> 70 units >> ...45 >> Lantus 50 units at bedtime   - Ozempic 0.5 mg weekly-added 09/2018 >> 1 mg weekly >> 0.75 mg weekly due to gastric discomfort/nausea >> 1 >> 0.75 mg weekly  - Novolog 8-12 units 15 min before meals - started 04/2022 >> 10-12 >> 10-16 >> 8-12 units 15 min before meals She was on Actos before. She did not start [place]. In 04/2022, we stopped glipizide and started NovoLog.  Pt checks her sugars seldom: - am: 154-239 >> 95-139, 143 >> 87-116 >> 91-110 - 2h after b'fast: 192-238 >> n/c >> 131-188 >> n/c - before lunch: 313 >> 108-131 >> 129, 138 >> 120-130 - 2h after lunch:  209-349 >> 128-160 >> 163-212 >> n/c - before dinner: 302 >> 110-119 >> 151, 170 >> 120-130 - 2h after dinner: 309-582 >> 147-220, 308 >>  168-228 >> 175-190, 210s, 300 - bedtime: 197-498 >> 132, 140 >> 170-188, 299 >> see above - nighttime: n/c Lowest sugar was 40 - 2012 >> ...   87 >> 70s x2; she has hypoglycemia awareness in the 49s. Highest sugar was 582 >> 308 >> 299 >> 300.  Glucometer: ReliOn  Pt's meals are: - Breakfast: if not skipping - egg + whole wheat toast; cheerios + milk - Lunch: chicken salad, apple, veggies, trisquits  - Dinner: meat + 2 veggies +/- rolls  In the past, she was on [place] for autoimmune hepatitis.  She could not tolerate it and had to stop.  She lost 24 pounds on it.  -+ CKD, last BUN/creatinine:  Lab Results  Component Value Date   BUN 28 (H) 03/21/2023   BUN 22 01/24/2023   CREATININE 1.47 (H) 03/21/2023   CREATININE 1.30 (H) 01/24/2023   Lab Results  Component Value Date   MICRALBCREAT 38.8 03/21/2023   MICRALBCREAT 18.3 03/13/2017   MICRALBCREAT 58.3 07/12/2016   MICRALBCREAT 16.0 05/06/2014   MICRALBCREAT 62.9 04/22/2013  On [place] 160.  -+ HL; last set of lipids: Lab Results  Component Value Date   CHOL 53 (L) 03/21/2023   HDL 21 (L) 03/21/2023   LDLCALC 2 03/21/2023   LDLDIRECT 93 11/15/2012   TRIG 191 (H) 03/21/2023  CHOLHDL 2.5 03/21/2023  On Crestor 20, fenofibrate, Vascepa.  Also on Repatha.  - last eye exam was in 03/2022: No DR reportedly. Dr. Dione Booze. Coming up.  - no numbness and tingling in her feet.  Last foot exam 07/21/2022.  Pt has FH of DM in M, B, M uncle, MGM.  She was admitted for chest pain 10/2018 after an episode of dehydration and had heart catheterization at that time >> had another stent and the previous stent was recanalized. She has a history of autoimmune hepatitis. Also, psoriasis, diagnosed as a child. She lost a signif. amount of weight on [place] in 2018.   She has a history of vertigo, improved on meclizine. She had Covid in 02/2021 >> could not eat and had GI issues for 3 weeks afterward. She had palpitations after Covid >>   improved. She sees Dr. Royann Shivers.  She has a h/o yeast infections. She got iron infusions in the past.  ROS: + see HPI  I reviewed pt's medications, allergies, PMH, social hx, family hx, and changes were documented in the history of present illness. Otherwise, unchanged from my initial visit note.  Past Medical History:  Diagnosis Date   Asthma    Cancer (HCC)    basal cell chest   Complication of anesthesia    Coronary artery disease    Diabetes mellitus without complication (HCC)    type 2   Heart murmur    Hepatitis    auto- immune   History of kidney stones    History of nuclear stress test 05/21/2010   dipyridamole; normal perfusion, preserved LV systolic EF of 66%   Hyperlipidemia    Hypertension    Liver failure (HCC)    h/o autoimmune hepatitis    Myocardial infarction (HCC)    Neuropathy    resolved with back surgery; tingling and burning left leg to foot   Obesity    PONV (postoperative nausea and vomiting)    Psoriasis    Tubular adenoma of colon    colon path 01/09/2017   Past Surgical History:  Procedure Laterality Date   Carotid Doppler  10/2008   R & L ICAs 0-49% diameter reduction    COLONOSCOPY     CORONARY ANGIOPLASTY WITH STENT PLACEMENT  09/2008   2.25x40mm Cypher DES to in-stent restenosis of prox LAD; Mini-Vision stent x2 2.0x46mm to area distal of initial LAD stent; 3 2.5x53mm Taxus stents prox to Cypher stent in circumflex   CORONARY ANGIOPLASTY WITH STENT PLACEMENT  09/2005   Cypher DES 3.0x45mm to distal RCA; 2.25x58mm Mini0Vision stent to mid LAD; 2.5x71mm Cypher stent to circumflex   CORONARY STENT INTERVENTION N/A 11/13/2018   Procedure: CORONARY STENT INTERVENTION;  Surgeon: Corky Crafts, MD;  Location[facility] INVASIVE CV LAB;  Service: Cardiovascular;  Laterality: N/A;   CORONARY STENT INTERVENTION N/A 12/02/2019   Procedure: CORONARY STENT INTERVENTION;  Surgeon: Yvonne Kendall, MD;  Location: [facility] INVASIVE CV LAB;  Service: Cardiovascular;   Laterality: N/A;   LEFT HEART CATH AND CORONARY ANGIOGRAPHY N/A 11/13/2018   Procedure: LEFT HEART CATH AND CORONARY ANGIOGRAPHY;  Surgeon: Corky Crafts, MD;  Location: [facility] INVASIVE CV LAB;  Service: Cardiovascular;  Laterality: N/A;   LEFT HEART CATH AND CORONARY ANGIOGRAPHY N/A 12/02/2019   Procedure: LEFT HEART CATH AND CORONARY ANGIOGRAPHY;  Surgeon: Yvonne Kendall, MD;  Location: [facility] INVASIVE CV LAB;  Service: Cardiovascular;  Laterality: N/A;   LUMBAR LAMINECTOMY/DECOMPRESSION MICRODISCECTOMY Left 10/18/2016   Procedure: Left Lumbar Four-Five Microdiscectomy;  Surgeon: Maeola Harman, MD;  Location: [facility] OR;  Service: Neurosurgery;  Laterality: Left;  Left L4-5 Microdiscectomy   ROTATOR CUFF REPAIR Left    SHOULDER ARTHROSCOPY W/ ROTATOR CUFF REPAIR Right    TRANSTHORACIC ECHOCARDIOGRAM  04/2010   EF=>55%, borderline conc LVH; trace MR & TR; AV mildly sclerotic; aortic root sclerosis/calcif   TRIGGER FINGER RELEASE Left 02/19/2020   Procedure: RELEASE TRIGGER FINGER/A-1 PULLEY;  Surgeon: Frederico Hamman, MD;  Location: [facility] OR;  Service: Orthopedics;  Laterality: Left;   ureteral stents     Social History   Socioeconomic History   Marital status: Single    Spouse name: Not on file   Number of children: 1   Years of education: Not on file   Highest education level: Not on file  Occupational History    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  Social Needs   Financial resource strain: Not on file   Food insecurity    Worry: Not on file    Inability: Not on file   Transportation needs    Medical: Not on file    Non-medical: Not on file  Tobacco Use   Smoking status: Never Smoker   Smokeless tobacco: Never Used  Substance and Sexual Activity   Alcohol use: No   Drug use: No   Sexual activity: Not Currently  Lifestyle   Physical activity    Days per week: Not on file    Minutes per session: Not on file   Stress: Not on file   Relationships   Social connections    Talks on phone: Not on file    Gets together: Not on file    Attends religious service: Not on file    Active member of club or organization: Not on file    Attends meetings of clubs or organizations: Not on file    Relationship status: Not on file   Intimate partner violence    Fear of current or ex partner: Not on file    Emotionally abused: Not on file    Physically abused: Not on file    Forced sexual activity: Not on file  Other Topics Concern   Not on file  Social History Narrative   Not on file   Current Outpatient Medications on File Prior to Visit  Medication Sig Dispense Refill   ascorbic acid (VITAMIN C) 500 MG tablet Take 500 mg by mouth daily.     bisoprolol (ZEBETA) 10 MG tablet Take 0.5 Tablet (5 MG) by mouth in the morning. Take One tablet (10 MG) by mouth in the evening. 225 tablet 3   calcipotriene (DOVONOX) 0.005 % ointment Apply topically daily in the afternoon. (Patient not taking: Reported on 03/21/2023)     clopidogrel (PLAVIX) 75 MG tablet TAKE 1 TABLET BY MOUTH EVERY DAY WITH BREAKFAST 90 tablet 3   Coenzyme Q10 300 MG CAPS Take 1 capsule (300 mg total) by mouth daily. 90 capsule 3   Cranberry 300 MG tablet Take 300 mg by mouth every evening.     Evolocumab (REPATHA SURECLICK) 140 MG/ML SOAJ Inject 140 mg into the skin every 14 (fourteen) days. 6 mL 3   fenofibrate (TRICOR) 145 MG tablet Take 1 tablet (145 mg total) by mouth every evening. 90 tablet 3   ferrous sulfate 324 (65 Fe) MG TBEC Take 1 tablet (324 mg total) by mouth in the morning and at bedtime. 180 tablet 3   fluticasone furoate-vilanterol (BREO ELLIPTA) 200-25 MCG/ACT AEPB  Inhale 1 puff into the lungs daily. (Patient not taking: Reported on 03/21/2023) 3 each 3   furosemide (LASIX) 20 MG tablet Take 20 mg on days weight is 185 or greater (Patient not taking: Reported on 03/21/2023) 30 tablet 2   glucosamine-chondroitin 500-400 MG tablet Take 1 tablet by mouth  daily. 90 tablet 3   glucose blood (ONETOUCH VERIO) test strip Use to check blood sugar 2 times a day 200 each 12   icosapent Ethyl (VASCEPA) 1 g capsule Take 2 capsules (2 g total) by mouth 2 (two) times daily. 260 capsule 3   insulin aspart (NOVOLOG FLEXPEN) 100 UNIT/ML FlexPen Inject 10-16 Units into the skin 3 (three) times daily before meals.     insulin glargine (LANTUS SOLOSTAR) 100 UNIT/ML Solostar Pen INJECT 50 UNITS INTO THE SKIN DAILY 45 mL 1   Insulin Pen Needle (BD PEN NEEDLE NANO 2ND GEN) 32G X 4 MM MISC USE AS DIRECTED FOR INSULIN ADMINISTRATION 300 each 3   isosorbide mononitrate (IMDUR) 60 MG 24 hr tablet TAKE 1 TABLET (60 MG TOTAL) BY MOUTH DAILY. ** DO NOT CRUSH ** 90 tablet 2   meclizine (ANTIVERT) 25 MG tablet Take 1 tablet (25 mg total) by mouth 3 (three) times daily as needed for dizziness. (Patient not taking: Reported on 03/21/2023) 30 tablet 1   metFORMIN (GLUCOPHAGE) 1000 MG tablet Take 1 tablet (1,000 mg total) by mouth 2 (two) times daily with a meal. 180 tablet 3   Multiple Vitamins-Minerals (CENTRUM SILVER 50+WOMEN PO) Take 1 tablet by mouth daily.     nitroGLYCERIN (NITROSTAT) 0.4 MG SL tablet For chest pain, tightness, or pressure. While sitting, place 1 tablet under tongue. May be used every 5 minutes as needed, for up to 15 minutes. Do not use more than 3 tablets. (Patient not taking: Reported on 03/21/2023) 25 tablet 3   nystatin cream (MYCOSTATIN) Apply topically as needed. (Patient not taking: Reported on 03/21/2023)     ReliOn Lancets Thin 26G MISC 1 Units by Does not apply route 2 (two) times daily. E 11.9 100 each 12   rosuvastatin (CRESTOR) 20 MG tablet Take 1 tablet (20 mg total) by mouth daily. 90 tablet 3   Semaglutide, 1 MG/DOSE, (OZEMPIC, 1 MG/DOSE,) 4 MG/3ML SOPN INJECT 1MG  INTO THE SKIN ONCE A WEEK 9 mL 3   sulfamethoxazole-trimethoprim (BACTRIM DS) 800-160 MG tablet Take 1 tablet by mouth 2 (two) times daily. 20 tablet 0   valsartan (DIOVAN) 160 MG  tablet Take 0.5 tablets (80 mg total) by mouth daily. 90 tablet 3   No current facility-administered medications on file prior to visit.   Allergies  Allergen Reactions   Penicillins Anaphylaxis and Hives    PATIENT HAD A PCN REACTION WITH IMMEDIATE RASH, FACIAL/TONGUE/THROAT SWELLING, SOB, OR LIGHTHEADEDNESS WITH HYPOTENSION:  #  #  #  YES  #  #  #    Has patient had a PCN reaction causing severe rash involving mucus membranes or skin necrosis:Unknown  Has patient had a PCN reaction that required hospitalization:No  Has patient had a PCN reaction occurring within the last 10 years:No  If all of the above answers are "NO", then may proceed with Cephalosporin use.  PATIENT HAD A PCN REACTION WITH IMMEDIATE RASH, FACIAL/TONGUE/THROAT SWELLING, SOB, OR LIGHTHEADEDNESS WITH HYPOTENSION:  #  #  #  YES  #  #  #   Has patient had a PCN reaction causing severe rash involving mucus membranes or skin necrosis:UnknownHas patient had  a PCN reaction that required hospitalization:No Has patient had a PCN reaction occurring within the last 10 years:No If all of the above answers are "NO", then may proceed with Cephalosporin use.   Coconut Flavoring Agent (Non-Screening) Swelling    Mouth breaks out    [place] Nucifera     Breaks out mouth   Other Swelling    Walnuts cause mouth to break out   Family History  Problem Relation Age of Onset   CAD Mother    CAD Father    Diabetes Brother    PE: BP 128/70   Pulse 82   Ht 5\' 4"  (1.626 m)   Wt 178 lb 9.6 oz (81 kg)   SpO2 97%   BMI 30.66 kg/m  Wt Readings from Last 3 Encounters:  04/06/23 178 lb 9.6 oz (81 kg)  03/21/23 179 lb 9.6 oz (81.5 kg)  01/16/23 182 lb 9.6 oz (82.8 kg)   Constitutional: overweight, in NAD Eyes: EOMI, no exophthalmos ENT: no thyromegaly, no cervical lymphadenopathy Cardiovascular: RRR, No RG, +2/6 SEM Respiratory: CTA B Musculoskeletal: no deformities Skin: no rashes Neurological: no tremor with outstretched  hands  ASSESSMENT: 1. DM2, insulin-dependent, uncontrolled, with long-term complications - CAD-s/p AMI, s/p stents, aortic valve stenosis - CKD  2. HL  3.  Obesity class I  PLAN:  1. Patient with longstanding DM2, with improved control at last 2 visits, with HbA1c at 6.2%.  Her diabetes improved after starting mealtime insulin and Ozempic.  She was not able to obtain a CGM due to cost.  At last visit, sugars are excellent in the morning but they were slightly higher than goal later in the day, with occasional hyperglycemic spikes in the 200s.  These were either due to forgetting to take NovoLog or dietary indiscretions.  We did discuss about taking NovoLog before every meal and even to increase the dose before meals with more carbs.  Otherwise we did not change the regimen.  She did have some nausea at that time but she did not feel that this was caused by Ozempic.  At today's visit, however, she did back off the dose to 0.75 mg weekly.  Her nausea has resolved. -At today's visit, sugars are mostly at goal, but she occasionally has lower blood sugars, 70s if she skips meals.  Especially in the setting of her lower HbA1c, I did advise her to try to decrease the Lantus dose.  We can continue the rest of the regimen for now.  She inquired about changes in her diabetic regimen during traveling she can continue with the same regimen but to pay attention to try to take Lantus approximately 24 hours apart.  Also, if she is staying active and starts seeing lower blood sugars, she may need to decrease both NovoLog and Lantus doses further. - I suggested to:  Patient Instructions  Please continue: - Metformin 1000 mg 2x a day with meals - Ozempic 0.75 mg weekly - Novolog 8-12 units 15 min before meals  Please decrease: - Lantus 45 units at bedtime  Please come back for a follow-up appointment in 4 months.  - we checked her HbA1c: 6.1% (lower) - advised to check sugars at different times of the day -  1x a day, rotating check times - advised for yearly eye exams >> she is UTD - return to clinic in 4 months  2. HL -R reviewed latest lipid panel from 02/2023: LDL low, triglycerides elevated and HDL also low, Lab Results  Component Value Date   CHOL 53 (L) 03/21/2023   HDL 21 (L) 03/21/2023   LDLCALC 2 03/21/2023   LDLDIRECT 93 11/15/2012   TRIG 191 (H) 03/21/2023   CHOLHDL 2.5 03/21/2023  -She is on Crestor  20 mg daily, fenofibrate 145 mg daily and Vascepa 2 g twice a day along with Repatha.  No side effects.  3.  Obesity class I -She continues on Ozempic which should also help with weight loss.  She was not able to start SGLT2 inhibitor in the past. -She lost 8 pounds before the last 2 visits combined -At today's visit, she lost 1 more  Carlus Pavlov, MD PhD [facility]

## 2023-04-06 NOTE — Patient Instructions (Addendum)
Please continue: - Metformin 1000 mg 2x a day with meals - Ozempic 0.75 mg weekly - Novolog 8-12 units 15 min before meals  Please decrease: - Lantus 45 units at bedtime  Please come back for a follow-up appointment in 4 months.

## 2023-04-25 DIAGNOSIS — H2513 Age-related nuclear cataract, bilateral: Secondary | ICD-10-CM | POA: Diagnosis not present

## 2023-04-25 DIAGNOSIS — H02834 Dermatochalasis of left upper eyelid: Secondary | ICD-10-CM | POA: Diagnosis not present

## 2023-04-25 DIAGNOSIS — E119 Type 2 diabetes mellitus without complications: Secondary | ICD-10-CM | POA: Diagnosis not present

## 2023-04-25 DIAGNOSIS — H43812 Vitreous degeneration, left eye: Secondary | ICD-10-CM | POA: Diagnosis not present

## 2023-04-25 DIAGNOSIS — H02831 Dermatochalasis of right upper eyelid: Secondary | ICD-10-CM | POA: Diagnosis not present

## 2023-04-25 DIAGNOSIS — H04123 Dry eye syndrome of bilateral lacrimal glands: Secondary | ICD-10-CM | POA: Diagnosis not present

## 2023-04-25 DIAGNOSIS — H02403 Unspecified ptosis of bilateral eyelids: Secondary | ICD-10-CM | POA: Diagnosis not present

## 2023-04-25 LAB — HM DIABETES EYE EXAM

## 2023-04-27 ENCOUNTER — Encounter: Payer: Self-pay | Admitting: Internal Medicine

## 2023-05-02 DIAGNOSIS — Z5181 Encounter for therapeutic drug level monitoring: Secondary | ICD-10-CM | POA: Diagnosis not present

## 2023-05-02 DIAGNOSIS — L409 Psoriasis, unspecified: Secondary | ICD-10-CM | POA: Diagnosis not present

## 2023-05-12 ENCOUNTER — Ambulatory Visit: Payer: Medicare PPO | Admitting: Cardiovascular Disease

## 2023-05-17 ENCOUNTER — Telehealth: Payer: Self-pay | Admitting: Emergency Medicine

## 2023-05-17 ENCOUNTER — Ambulatory Visit: Payer: Medicare PPO | Admitting: Cardiovascular Disease

## 2023-05-17 NOTE — Telephone Encounter (Signed)
Left message asking if she would be able to come in today before 1130. Her appt today was cancelled due to weather. Dr Royann Shivers has to leave by 1200, but could see her 1130 or earlier. Left call back number

## 2023-05-17 NOTE — Telephone Encounter (Signed)
The patient returned the call and able to reschedule for tomorrow, 05/18/23 at 11:00

## 2023-05-18 ENCOUNTER — Ambulatory Visit: Payer: Medicare PPO | Attending: Cardiovascular Disease | Admitting: Cardiovascular Disease

## 2023-05-18 ENCOUNTER — Ambulatory Visit
Admission: RE | Admit: 2023-05-18 | Discharge: 2023-05-18 | Disposition: A | Discharge status: Home / Self Care | Payer: Medicare PPO | Source: Ambulatory Visit | Attending: Cardiovascular Disease | Admitting: Cardiovascular Disease

## 2023-05-18 ENCOUNTER — Encounter: Payer: Self-pay | Admitting: Cardiovascular Disease

## 2023-05-18 VITALS — BP 174/90 | HR 82 | Ht 64.0 in | Wt 180.2 lb

## 2023-05-18 DIAGNOSIS — R0602 Shortness of breath: Secondary | ICD-10-CM

## 2023-05-18 DIAGNOSIS — E1169 Type 2 diabetes mellitus with other specified complication: Secondary | ICD-10-CM

## 2023-05-18 DIAGNOSIS — E119 Type 2 diabetes mellitus without complications: Secondary | ICD-10-CM

## 2023-05-18 DIAGNOSIS — R062 Wheezing: Secondary | ICD-10-CM

## 2023-05-18 DIAGNOSIS — Z794 Long term (current) use of insulin: Secondary | ICD-10-CM

## 2023-05-18 DIAGNOSIS — I10 Essential (primary) hypertension: Secondary | ICD-10-CM | POA: Diagnosis not present

## 2023-05-18 DIAGNOSIS — J4530 Mild persistent asthma, uncomplicated: Secondary | ICD-10-CM

## 2023-05-18 DIAGNOSIS — E785 Hyperlipidemia, unspecified: Secondary | ICD-10-CM

## 2023-05-18 DIAGNOSIS — E782 Mixed hyperlipidemia: Secondary | ICD-10-CM

## 2023-05-18 DIAGNOSIS — I25118 Atherosclerotic heart disease of native coronary artery with other forms of angina pectoris: Secondary | ICD-10-CM | POA: Diagnosis not present

## 2023-05-18 DIAGNOSIS — I350 Nonrheumatic aortic (valve) stenosis: Secondary | ICD-10-CM

## 2023-05-18 DIAGNOSIS — I5032 Chronic diastolic (congestive) heart failure: Secondary | ICD-10-CM

## 2023-05-18 DIAGNOSIS — R42 Dizziness and giddiness: Secondary | ICD-10-CM

## 2023-05-18 DIAGNOSIS — N1832 Chronic kidney disease, stage 3b: Secondary | ICD-10-CM

## 2023-05-18 MED ORDER — ICOSAPENT ETHYL 1 G PO CAPS: 2.0000 g | ORAL_CAPSULE | Freq: Two times a day (BID) | ORAL | 3 refills | Status: AC

## 2023-05-18 MED ORDER — MECLIZINE HCL 25 MG PO TABS: 25.0000 mg | ORAL_TABLET | Freq: Three times a day (TID) | ORAL | 11 refills | Status: DC | PRN

## 2023-05-18 MED ORDER — FERROUS SULFATE 324 (65 FE) MG PO TBEC: 324.0000 mg | DELAYED_RELEASE_TABLET | Freq: Two times a day (BID) | ORAL | 3 refills | Status: DC

## 2023-05-18 MED ORDER — NITROGLYCERIN 0.4 MG SL SUBL: SUBLINGUAL_TABLET | SUBLINGUAL | 3 refills | Status: DC

## 2023-05-18 NOTE — Patient Instructions (Signed)
Medication Instructions:  No changes *If you need a refill on your cardiac medications before your next appointment, please call your pharmacy*  Testing/Procedures: Your physician has requested that you have an echocardiogram. Echocardiography is a painless test that uses sound waves to create images of your heart. It provides your doctor with information about the size and shape of your heart and how well your heart's chambers and valves are working. This procedure takes approximately one hour. There are no restrictions for this procedure. Please do NOT wear cologne, perfume, aftershave, or lotions (deodorant is allowed). Please arrive 15 minutes prior to your appointment time.  Please note: We ask at that you not bring children with you during ultrasound (echo/ vascular) testing. Due to room size and safety concerns, children are not allowed in the ultrasound rooms during exams. Our front office staff cannot provide observation of children in our lobby area while testing is being conducted. An adult accompanying a patient to their appointment will only be allowed in the ultrasound room at the discretion of the ultrasound technician under special circumstances. We apologize for any inconvenience.   A chest x-ray takes a picture of the organs and structures inside the chest, including the heart, lungs, and blood vessels. This test can show several things, including, whether the heart is enlarges; whether fluid is building up in the lungs; and whether pacemaker / defibrillator leads are still in place.   This will be performed at [facility]- [address] [place]. You can walk in to have this done.    Follow-Up: At [facility], you and your health needs are our priority.  As part of our continuing mission to provide you with exceptional heart care, we have created designated Provider Care Teams.  These Care Teams include your primary Cardiologist (physician) and Advanced  Practice Providers (APPs -  Physician Assistants and Nurse Practitioners) who all work together to provide you with the care you need, when you need it.  We recommend signing up for the patient portal called "MyChart".  Sign up information is provided on this After Visit Summary.  MyChart is used to connect with patients for Virtual Visits (Telemedicine).  Patients are able to view lab/test results, encounter notes, upcoming appointments, etc.  Non-urgent messages can be sent to your provider as well.   To learn more about what you can do with MyChart, go to [email or web address].    Your next appointment:   1 year(s)  Provider:   Dr Royann Shivers

## 2023-05-19 ENCOUNTER — Encounter: Payer: Self-pay | Admitting: Cardiovascular Disease

## 2023-05-19 NOTE — Progress Notes (Signed)
Patient ID: Haley George, female   DOB: Jul 22, 1955, 68 y.o.   MRN: [id number]     Cardiology Office Note    Date:  05/19/2023   ID:  Haley George, DOB [birth year], MRN [id number]  PCP:  Ronnald Nian, MD  Cardiologist:   Thurmon Fair, MD   Chief Complaint  Patient presents with   Coronary Artery Disease   Congestive Heart Failure    History of Present Illness:  Haley George is a 68 y.o. female with diabetes mellitus type II, severe mixed hyperlipidemia, CAD requiring PCI in all 3 major coronary arteries including redo PCI for restenosis, mild calcific aortic stenosis, chronic diastolic heart failure, mild bilateral carotid artery stenosis (less than 50%), reactive airway disease (was better on bisoprolol rather than carvedilol).  Most recent intervention was in August 2021 with placement of 2 drug-eluting stents in the mid-distal left circumflex coronary artery for both de novo lesion and distal in-stent restenosis (small non-STEMI with peak troponin 1800).   She has done well from a cardiac point of view.  She has not had any episodes of heart failure exacerbation and denies any chest pain at rest or with activity.  She did have upper respiratory tract infection last month that has led to persistent wheezing.  She has not had dizziness or syncope and denies palpitations.  No new focal neurological complaints or claudication.  Occasionally has "fat feet" and will take a dose of furosemide, but this is infrequent.  Her biggest problem has been progressively worsening psoriasis which she says now covers 80% of the skin of her torso and limbs.  It is accompanied by increased itching which has become intolerable.  Plan to start Skyrizi (Dr. Emily Filbert).  She's also been noticed to have eosinophilia (626, 10%) and it has been recommended that she discontinue all her over-the-counter supplements as a first step.  She is reluctant to stop the co-Q10 which has helped with statin intolerance.  She did  notice that her joints are less flexible since she stopped taking glucosamine.  Her ECG today is normal.  Her most recent follow-up echocardiogram which was performed 04/22/2022 showed preserved left ventricular systolic function and moderate aortic stenosis (mean gradient 20 mmHg).  Metabolic control remains very good.  Her recent hemoglobin A1c was 6.1% total cholesterol is only 53 and calculated LDL is only 2.  Triglycerides remain stubbornly slightly elevated at 191.  She is taking aggressive lipid-lowering therapy with a combination of Repatha, rosuvastatin, Vascepa and fenofibrate.  Most recent potassium was 4.9.  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    She still grieves after the loss of her husband can about 3 years ago and bemoans the fact that her daughter Trula Ore seems to be settling in [place] where she is buying a home.  On the bright side, she is planning "the trip of a lifetime" with an old friend who is also a widow.  They are going to a cruise in [place] and [place] in mid February for about 3 weeks.  She is very excited about this.  She will be returning from her trip on March 16.  She developed unstable angina and she underwent cardiac catheterization on December 02, 2019.  The culprit lesion was found to be a 95% in-stent restenosis in the mid left circumflex coronary artery, as well as a de novo 70% mid left circumflex stenosis at the OM2 bifurcation.  This was treated  by placement of overlapping 2.5x12 and 2.25x20 drug-eluting Synergy stents (Dr. Okey Dupre).  Also noted was a mild-moderate mid to distal LAD in-stent restenosis and an unchanged 80% RPDA lesion.  She had complete relief of her angina following this intervention.    Previous coronary interventions 2007  - mid LAD 2.2512 mini vision - left circumflex 2.518 Cypher  - RCA 3.023 Cypher  2010  - mid LAD 2.2512 Taxus overlapping 2.012 mini vision, overlapping another  2.012 mini vision -  left circumflex 2.516 Taxus upstream of previous stent.  July 2020  - mid LAD Synergy 2.5 x12 , overlapping the proximal area of restenosis in the mid LAD (short area of 3 stent layers).  August 2021 - mid LCX overlapping 2.5x12 and 2.25x20 drug-eluting Synergy (the distal stent overlaps the previously placed Taxus stent from 2010)  She had a normal nuclear stress test in November 2016. EF was 56%. She had a "false positive" ECG response.  In July 2020 she had a moderate sized area of ischemia in the anteroseptal wall, EF 52%, Prior to placement of the mid LAD stent.  Echo in August 2021 showed normal left ventricular regional motion and EF 55-60%, unchanged by echo in December 2023.  Past Medical History:  Diagnosis Date   Asthma    Cancer (HCC)    basal cell chest   Complication of anesthesia    Coronary artery disease    Diabetes mellitus without complication (HCC)    type 2   Heart murmur    Hepatitis    auto- immune   History of kidney stones    History of nuclear stress test 05/21/2010   dipyridamole; normal perfusion, preserved LV systolic EF of 66%   Hyperlipidemia    Hypertension    Liver failure (HCC)    h/o autoimmune hepatitis    Myocardial infarction (HCC)    Neuropathy    resolved with back surgery; tingling and burning left leg to foot   Obesity    PONV (postoperative nausea and vomiting)    Psoriasis    Tubular adenoma of colon    colon path 01/09/2017    Past Surgical History:  Procedure Laterality Date   Carotid Doppler  10/2008   R & L ICAs 0-49% diameter reduction    COLONOSCOPY     CORONARY ANGIOPLASTY WITH STENT PLACEMENT  09/2008   2.25x91mm Cypher DES to in-stent restenosis of prox LAD; Mini-Vision stent x2 2.0x47mm to area distal of initial LAD stent; 3 2.5x40mm Taxus stents prox to Cypher stent in circumflex   CORONARY ANGIOPLASTY WITH STENT PLACEMENT  09/2005   Cypher DES 3.0x65mm to distal RCA; 2.25x71mm Mini0Vision stent  to mid LAD; 2.5x33mm Cypher stent to circumflex   CORONARY STENT INTERVENTION N/A 11/13/2018   Procedure: CORONARY STENT INTERVENTION;  Surgeon: Corky Crafts, MD;  Location: [facility] INVASIVE CV LAB;  Service: Cardiovascular;  Laterality: N/A;   CORONARY STENT INTERVENTION N/A 12/02/2019   Procedure: CORONARY STENT INTERVENTION;  Surgeon: Yvonne Kendall, MD;  Location: [facility] INVASIVE CV LAB;  Service: Cardiovascular;  Laterality: N/A;   LEFT HEART CATH AND CORONARY ANGIOGRAPHY N/A 11/13/2018   Procedure: LEFT HEART CATH AND CORONARY ANGIOGRAPHY;  Surgeon: Corky Crafts, MD;  Location: [facility] INVASIVE CV LAB;  Service: Cardiovascular;  Laterality: N/A;   LEFT HEART CATH AND CORONARY ANGIOGRAPHY N/A 12/02/2019   Procedure: LEFT HEART CATH AND CORONARY ANGIOGRAPHY;  Surgeon: Yvonne Kendall, MD;  Location: [facility] INVASIVE CV LAB;  Service: Cardiovascular;  Laterality: N/A;  LUMBAR LAMINECTOMY/DECOMPRESSION MICRODISCECTOMY Left 10/18/2016   Procedure: Left Lumbar Four-Five Microdiscectomy;  Surgeon: Maeola Harman, MD;  Location: [facility] OR;  Service: Neurosurgery;  Laterality: Left;  Left L4-5 Microdiscectomy   ROTATOR CUFF REPAIR Left    SHOULDER ARTHROSCOPY W/ ROTATOR CUFF REPAIR Right    TRANSTHORACIC ECHOCARDIOGRAM  04/2010   EF=>55%, borderline conc LVH; trace MR & TR; AV mildly sclerotic; aortic root sclerosis/calcif   TRIGGER FINGER RELEASE Left 02/19/2020   Procedure: RELEASE TRIGGER FINGER/A-1 PULLEY;  Surgeon: Frederico Hamman, MD;  Location: [facility] OR;  Service: Orthopedics;  Laterality: Left;   ureteral stents      Outpatient Medications Prior to Visit  Medication Sig Dispense Refill   ascorbic acid (VITAMIN C) 500 MG tablet Take 500 mg by mouth daily.     bisoprolol (ZEBETA) 10 MG tablet Take 0.5 Tablet (5 MG) by mouth in the morning. Take One tablet (10 MG) by mouth in the evening. 225 tablet 3   clopidogrel (PLAVIX) 75 MG tablet TAKE 1 TABLET BY MOUTH EVERY DAY WITH BREAKFAST 90 tablet 3    Evolocumab (REPATHA SURECLICK) 140 MG/ML SOAJ Inject 140 mg into the skin every 14 (fourteen) days. 6 mL 3   fenofibrate (TRICOR) 145 MG tablet Take 1 tablet (145 mg total) by mouth every evening. 90 tablet 3   fluticasone furoate-vilanterol (BREO ELLIPTA) 200-25 MCG/ACT AEPB Inhale 1 puff into the lungs daily. 3 each 3   glucose blood (ONETOUCH VERIO) test strip Use to check blood sugar 2 times a day 200 each 12   insulin aspart (NOVOLOG FLEXPEN) 100 UNIT/ML FlexPen Inject 10-16 Units into the skin 3 (three) times daily before meals. (Patient taking differently: Inject 8-12 Units into the skin 3 (three) times daily before meals.)     insulin glargine (LANTUS SOLOSTAR) 100 UNIT/ML Solostar Pen Inject 45 Units into the skin daily.     Insulin Pen Needle (BD PEN NEEDLE NANO 2ND GEN) 32G X 4 MM MISC USE AS DIRECTED FOR INSULIN ADMINISTRATION 300 each 3   isosorbide mononitrate (IMDUR) 60 MG 24 hr tablet TAKE 1 TABLET (60 MG TOTAL) BY MOUTH DAILY. ** DO NOT CRUSH ** 90 tablet 2   metFORMIN (GLUCOPHAGE) 1000 MG tablet Take 1 tablet (1,000 mg total) by mouth 2 (two) times daily with a meal. 180 tablet 3   nystatin cream (MYCOSTATIN) Apply topically as needed.     ReliOn Lancets Thin 26G MISC 1 Units by Does not apply route 2 (two) times daily. E 11.9 100 each 12   rosuvastatin (CRESTOR) 20 MG tablet Take 1 tablet (20 mg total) by mouth daily. 90 tablet 3   Semaglutide, 1 MG/DOSE, (OZEMPIC, 1 MG/DOSE,) 4 MG/3ML SOPN Inject 1 mg into the skin once a week.     valsartan (DIOVAN) 160 MG tablet Take 160 mg by mouth daily.     ferrous sulfate 324 (65 Fe) MG TBEC Take 1 tablet (324 mg total) by mouth in the morning and at bedtime. 180 tablet 3   icosapent Ethyl (VASCEPA) 1 g capsule Take 2 capsules (2 g total) by mouth 2 (two) times daily. 260 capsule 3   meclizine (ANTIVERT) 25 MG tablet Take 1 tablet (25 mg total) by mouth 3 (three) times daily as needed for dizziness. 30 tablet 1   calcipotriene (DOVONOX)  0.005 % ointment Apply topically daily in the afternoon. (Patient not taking: Reported on 05/18/2023)     Coenzyme Q10 300 MG CAPS Take 1 capsule (300 mg total) by  mouth daily. (Patient not taking: Reported on 05/18/2023) 90 capsule 3   Cranberry 300 MG tablet Take 300 mg by mouth every evening. (Patient not taking: Reported on 05/18/2023)     furosemide (LASIX) 20 MG tablet Take 20 mg on days weight is 185 or greater (Patient not taking: Reported on 05/18/2023) 30 tablet 2   glucosamine-chondroitin 500-400 MG tablet Take 1 tablet by mouth daily. (Patient not taking: Reported on 05/18/2023) 90 tablet 3   Multiple Vitamins-Minerals (CENTRUM SILVER 50+WOMEN PO) Take 1 tablet by mouth daily. (Patient not taking: Reported on 05/18/2023)     sulfamethoxazole-trimethoprim (BACTRIM DS) 800-160 MG tablet Take 1 tablet by mouth 2 (two) times daily. (Patient not taking: Reported on 05/18/2023) 20 tablet 0   nitroGLYCERIN (NITROSTAT) 0.4 MG SL tablet For chest pain, tightness, or pressure. While sitting, place 1 tablet under tongue. May be used every 5 minutes as needed, for up to 15 minutes. Do not use more than 3 tablets. (Patient not taking: Reported on 05/18/2023) 25 tablet 3   valsartan (DIOVAN) 160 MG tablet Take 0.5 tablets (80 mg total) by mouth daily. (Patient not taking: Reported on 05/18/2023) 90 tablet 3   No facility-administered medications prior to visit.     Allergies:   Penicillins, Coconut flavoring agent (non-screening), Cocos nucifera, and Other   Social History   Socioeconomic History   Marital status: Married    Spouse name: Not on file   Number of children: 1   Years of education: Not on file   Highest education level: Not on file  Occupational History    Employer: [employer]  Tobacco Use   Smoking status: Never   Smokeless tobacco: Never  Vaping Use   Vaping status: Never Used  Substance and Sexual Activity   Alcohol use: No   Drug use: No   Sexual activity: Not  Currently  Other Topics Concern   Not on file  Social History Narrative   Not on file   Social Drivers of Health   Financial Resource Strain: Low Risk  (03/21/2023)   Overall Financial Resource Strain (CARDIA)    Difficulty of Paying Living Expenses: Not very hard  Food Insecurity: No Food Insecurity (03/21/2023)   Hunger Vital Sign    Worried About Running Out of Food in the Last Year: Never true    Ran Out of Food in the Last Year: Never true  Transportation Needs: No Transportation Needs (03/21/2023)   PRAPARE - Administrator, Civil Service (Medical): No    Lack of Transportation (Non-Medical): No  Physical Activity: Inactive (03/21/2023)   Exercise Vital Sign    Days of Exercise per Week: 1 day    Minutes of Exercise per Session: 0 min  Stress: Stress Concern Present (03/21/2023)   [employer] of Occupational Health - Occupational Stress Questionnaire    Feeling of Stress : Rather much  Social Connections: Moderately Integrated (03/21/2023)   Social Connection and Isolation Panel [NHANES]    Frequency of Communication with Friends and Family: Twice a week    Frequency of Social Gatherings with Friends and Family: Twice a week    Attends Religious Services: More than 4 times per year    Active Member of [employer] or Organizations: Yes    Attends Banker Meetings: More than 4 times per year    Marital Status: Widowed     ROS:   Please see the history of present illness.    ROS  All other systems are reviewed and are negative.   PHYSICAL EXAM:   VS:  BP (!) 174/90   Pulse 82   Ht 5\' 4"  (1.626 m)   Wt 180 lb 3.2 oz (81.7 kg)   SpO2 96%   BMI 30.93 kg/m      General: Alert, oriented x3, no distress, borderline obese Head: no evidence of trauma, PERRL, EOMI, no exophtalmos or lid lag, no myxedema, no xanthelasma; normal ears, nose and oropharynx Neck: normal jugular venous pulsations and no hepatojugular reflux; brisk carotid pulses  without delay and bilateral carotid bruits a little louder on the right Chest: He has a little bit of wheezing in both bases, otherwise clear to auscultation Cardiovascular: normal position and quality of the apical impulse, regular rhythm, normal first and still very distinct second heart sounds, early peaking 3/6 aortic ejection murmur no diastolic murmurs, rubs or gallops Abdomen: no tenderness or distention, no masses by palpation, no abnormal pulsatility or arterial bruits, normal bowel sounds, no hepatosplenomegaly Extremities: No edema, normal distal pulses. Neurological: grossly nonfocal Psych: Normal mood and affect     Wt Readings from Last 3 Encounters:  05/18/23 180 lb 3.2 oz (81.7 kg)  04/06/23 178 lb 9.6 oz (81 kg)  03/21/23 179 lb 9.6 oz (81.5 kg)    Studies/Labs Reviewed:   ECHO12/29/2023     1. Left ventricular ejection fraction, by estimation, is 60 to 65%. The  left ventricle has normal function. The left ventricle has no regional  wall motion abnormalities. There is mild left ventricular hypertrophy.  Left ventricular diastolic parameters  were normal.   2. Right ventricular systolic function is normal. The right ventricular  size is normal. There is normal pulmonary artery systolic pressure.   3. Left atrial size was mildly dilated.   4. The mitral valve is abnormal. Mild mitral valve regurgitation. No  evidence of mitral stenosis.   5. The aortic valve is tricuspid. There is moderate calcification of the  aortic valve. There is moderate thickening of the aortic valve. Aortic  valve regurgitation is not visualized. Moderate aortic valve stenosis.   6. The inferior vena cava is dilated in size with >50% respiratory  variability, suggesting right atrial pressure of 8 mmHg.   LV e' medial:    7.40 cm/s  LV E/e' medial:  16.4  LV e' lateral:   7.29 cm/s  LV E/e' lateral: 16.6   AV Area (VTI):     1.19 cm  AV Vmax:           306.00 cm/s  AV Peak Grad:       37.5 mmHg  AV Mean Grad:      20.8 mmHg  LVOT/AV VTI ratio: 0.47   EKG:   EKG Interpretation Date/Time:  Thursday May 18 2023 11:24:31 EST Ventricular Rate:  82 PR Interval:  124 QRS Duration:  76 QT Interval:  368 QTC Calculation: 429 R Axis:   41  Text Interpretation: Normal sinus rhythm Normal ECG When compared with ECG of 27-Aug-2020 00:15, PREVIOUS ECG IS PRESENT Confirmed by Berwyn Bigley ([id number]) on 05/19/2023 8:32:01 AM         Recent Labs: 03/21/2023: ALT 21; BUN 28; Creatinine, Ser 1.47; Hemoglobin 11.3; Magnesium 1.6; Platelets 250; Potassium 4.9; Sodium 140   Lipid Panel    Component Value Date/Time   CHOL 53 (L) 03/21/2023 0942   TRIG 191 (H) 03/21/2023 0942   HDL 21 (L) 03/21/2023 0942   CHOLHDL 2.5 03/21/2023 1914  CHOLHDL 6.7 12/02/2019 0336   VLDL UNABLE TO CALCULATE IF TRIGLYCERIDE OVER 400 mg/dL 57/84/6962 9528   LDLCALC 2 03/21/2023 0942   LDLDIRECT 93 11/15/2012 0829    ASSESSMENT:    1. Essential hypertension   2. Chronic diastolic heart failure (HCC)   3. Coronary artery disease of native artery of native heart with stable angina pectoris (HCC)   4. Mild persistent asthma without complication   5. Wheezing   6. Shortness of breath   7. Nonrheumatic aortic valve stenosis   8. Mixed hyperlipidemia   9. Hyperlipidemia associated with type 2 diabetes mellitus (HCC)   10. Stage 3b chronic kidney disease (HCC)   11. Insulin-requiring or dependent type II diabetes mellitus (HCC)   12. Vertigo    PLAN:  In order of problems listed above:  CHF: Appears clinically euvolemic and has not had any recent episodes of heart failure exacerbation.  Only takes furosemide as needed based on weight and presence or absence of pedal edema.  She is aware that she needs to maintain a lower sodium diet and to continue monitoring her weight during her cruise.  She reports that she was able to request a low sodium diet from the cruise company and that she plans  to weigh herself at the spot on a daily basis..  Continue to take furosemide to "as needed" for weight 185 lb or higher She has preserved left ventricular systolic function.  Unable to tolerate SGLT2 inhibitors due to frequent urinary tract infections even before taking these medications.  CAD:   Currently asymptomatic both at rest and with activity.  (there's always been some suspicion of a contribution of coronary spasm, but we had to switch from carvedilol to bisoprolol due to reactive airway disease.).  Taking chronic antiplatelet therapy with clopidogrel long term due to complex CAD.  On highly effective lipid-lowering therapy with combination rosuvastatin and Repatha and Vascepa and fenofibrate.  Asthma: Using highly selective selective beta-blocker Bisoprolol to avoid worsening bronchospasm (occurred with carvedilol and metoprolol). HLP: Lipid parameters are pretty good with the exception of borderline elevated triglycerides.  Excellent LDL, chronically very low HDL.  She has multiple family members with early onset CAD including an uncle who passed away at age 77 and an uncle that required bypass surgery at 47. She is on a very aggressive lipid-lowering therapy with PCSK9 inhibitor, high-dose statin,  fenofibrate, Vascepa.  All of these are clearly necessary and she still has elevated triglycerides despite taking them.   AS: Moderate by most recent echo roughly 1 year ago.  Due for repeat study.  Physical exam suggests nonsevere aortic stenosis. HTN: Surprisingly high today.  Usually much lower at home.  Was 120/70 at last month's office visit with her endocrinologist.  No changes were made to her medications today, but I asked her to keep an eye on her blood pressure. DM: Continues to maintain very good glycemic control on semaglutide and insulin. CKD 3B: Over the last 6 months creatinine has been stable in the 1.3-1.5 range (GFR approximately 45).   She has had a couple of events in the last 3 years  where she had acute kidney injury with a creatinine that increased to 2.5-3.0 range, most recently due to her episode of vertigo in 2023.  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  Avoid other medications that can raise potassium such as aldosterone antagonist, trimethoprim. Overweight/mildly obese: Her weight has  continued to oscillate right around a BMI of 30-31. Anemia: Has resolved completely on iron supplements.  Shortness of breath and chest pain were both worse when she was anemic. Psoriasis:  sees Dr. Emily Filbert.  Plan to start [facility]. Depression: Seems to be doing well right now and is excited about her trip to [place].  Seems to worsen around the holidays and around Ken's birthday and anniversary of his death. She is staying socially engaged.  I am glad that she is showing interest in the planned trip to [place] with her friend. BPV: Not currently active.  This was severe enough to cause hospitalization for a couple of days in 2024, but has resolved.  She is aware of [place] maneuvers.  Usually improves promptly when she starts meclizine.  Will refill her meclizine also for the ocean trip where she might experience motion sickness.   Medication Adjustments/Labs and Tests Ordered: Current medicines are reviewed at length with the patient today.  Concerns regarding medicines are outlined above.  Medication changes, Labs and Tests ordered today are listed in the Patient Instructions below. Patient Instructions  Medication Instructions:  No changes *If you need a refill on your cardiac medications before your next appointment, please call your pharmacy*  Testing/Procedures: Your physician has requested that you have an echocardiogram. Echocardiography is a painless test that uses sound waves to create images of your heart. It provides your doctor with information about the size and shape of your heart and how well your heart's  chambers and valves are working. This procedure takes approximately one hour. There are no restrictions for this procedure. Please do NOT wear cologne, perfume, aftershave, or lotions (deodorant is allowed). Please arrive 15 minutes prior to your appointment time.  Please note: We ask at that you not bring children with you during ultrasound (echo/ vascular) testing. Due to room size and safety concerns, children are not allowed in the ultrasound rooms during exams. Our front office staff cannot provide observation of children in our lobby area while testing is being conducted. An adult accompanying a patient to their appointment will only be allowed in the ultrasound room at the discretion of the ultrasound technician under special circumstances. We apologize for any inconvenience.   A chest x-ray takes a picture of the organs and structures inside the chest, including the heart, lungs, and blood vessels. This test can show several things, including, whether the heart is enlarges; whether fluid is building up in the lungs; and whether pacemaker / defibrillator leads are still in place.   This will be performed at [facility] [address] [place]. You can walk in to have this done.    Follow-Up: At [facility], you and your health needs are our priority.  As part of our continuing mission to provide you with exceptional heart care, we have created designated Provider Care Teams.  These Care Teams include your primary Cardiologist (physician) and Advanced Practice Providers (APPs -  Physician Assistants and Nurse Practitioners) who all work together to provide you with the care you need, when you need it.  We recommend signing up for the patient portal called "MyChart".  Sign up information is provided on this After Visit Summary.  MyChart is used to connect with patients for Virtual Visits (Telemedicine).  Patients are able to view lab/test results, encounter notes, upcoming  appointments, etc.  Non-urgent messages can be sent to your provider as well.   To learn more about what you can do with  MyChart, go to [email or web address].    Your next appointment:   1 year(s)  Provider:   Dr Royann Shivers              Signed, Thurmon Fair, MD  05/19/2023 5:17 PM    [facility] [address] [place], [place], [place]  [address] Phone: [phone]; Fax: [phone]

## 2023-06-06 ENCOUNTER — Other Ambulatory Visit (HOSPITAL_COMMUNITY): Payer: Self-pay

## 2023-06-07 DIAGNOSIS — M545 Low back pain, unspecified: Secondary | ICD-10-CM | POA: Diagnosis not present

## 2023-06-10 ENCOUNTER — Other Ambulatory Visit: Payer: Self-pay | Admitting: Cardiovascular Disease

## 2023-06-10 DIAGNOSIS — E1169 Type 2 diabetes mellitus with other specified complication: Secondary | ICD-10-CM

## 2023-06-13 ENCOUNTER — Ambulatory Visit (HOSPITAL_COMMUNITY)
Admission: RE | Admit: 2023-06-13 | Discharge: 2023-06-13 | Disposition: A | Discharge status: Home / Self Care | Payer: Medicare PPO | Source: Ambulatory Visit | Attending: Cardiovascular Disease | Admitting: Cardiovascular Disease

## 2023-06-13 ENCOUNTER — Other Ambulatory Visit: Payer: Self-pay

## 2023-06-13 DIAGNOSIS — I350 Nonrheumatic aortic (valve) stenosis: Secondary | ICD-10-CM | POA: Diagnosis not present

## 2023-06-13 LAB — ECHOCARDIOGRAM COMPLETE
AR max vel: 1.12 cm2
AV Area VTI: 1.13 cm2
AV Area mean vel: 1.04 cm2
AV Mean grad: 18 mm[Hg]
AV Peak grad: 32 mm[Hg]
Ao pk vel: 2.83 m/s
Area-P 1/2: 3.21 cm2
MV M vel: 5.77 m/s
MV Peak grad: 133.2 mm[Hg]
S' Lateral: 2.6 cm

## 2023-06-13 MED ORDER — BISOPROLOL FUMARATE 10 MG PO TABS: ORAL_TABLET | ORAL | 3 refills | Status: DC

## 2023-06-13 MED ORDER — BISOPROLOL FUMARATE 5 MG PO TABS: ORAL_TABLET | ORAL | 3 refills | Status: DC

## 2023-08-08 ENCOUNTER — Ambulatory Visit: Payer: Medicare PPO | Admitting: Internal Medicine

## 2023-08-29 NOTE — Progress Notes (Unsigned)
 No chief complaint on file.    She last was treated for UTI in 02/2023 by Dr. Robina Chol. Culture grew out 50-100K cfu of citrobacter braakii, which was sensitive to the Bactrim  that was prescribed. (Resistant to Augmentin, cefazolin, ceftriaxone, cefuroxime; also was sensitive to nitrofurantoin , tetracycline, cipro , cefepime)  She was also treated for UTI in 12/2022 by Jimmye Moulds. Culture grew >100K cfu Klebsiella pneumoniae, sensitive to the macrobid  that was prescribed. (Resistant to amp, sensitive to everything else).    PMH, PSH, SH reviewed  DM, CAD, HTN, HLD, CKD  Lab Results  Component Value Date   HGBA1C 6.1 (A) 04/06/2023   Lab Results  Component Value Date   CREATININE 1.47 (H) 03/21/2023      ROS:    PHYSICAL EXAM:  There were no vitals taken for this visit.  Wt Readings from Last 3 Encounters:  05/18/23 180 lb 3.2 oz (81.7 kg)  04/06/23 178 lb 9.6 oz (81 kg)  03/21/23 179 lb 9.6 oz (81.5 kg)        ASSESSMENT/PLAN:

## 2023-08-30 ENCOUNTER — Ambulatory Visit: Admitting: Family Medicine

## 2023-08-30 ENCOUNTER — Encounter: Payer: Self-pay | Admitting: Family Medicine

## 2023-08-30 VITALS — BP 144/90 | HR 68 | Temp 97.3°F | Ht 64.0 in | Wt 179.4 lb

## 2023-08-30 DIAGNOSIS — R300 Dysuria: Secondary | ICD-10-CM | POA: Diagnosis not present

## 2023-08-30 DIAGNOSIS — R350 Frequency of micturition: Secondary | ICD-10-CM

## 2023-08-30 LAB — POCT URINALYSIS DIP (PROADVANTAGE DEVICE)
Bilirubin, UA: NEGATIVE
Blood, UA: NEGATIVE
Glucose, UA: NEGATIVE mg/dL
Ketones, POC UA: NEGATIVE mg/dL
Leukocytes, UA: NEGATIVE
Nitrite, UA: NEGATIVE
Specific Gravity, Urine: 1.015
Urobilinogen, Ur: 0.2
pH, UA: 6 (ref 5.0–8.0)

## 2023-08-30 NOTE — Patient Instructions (Signed)
  Your urine today was normal, no evidence of infection. Given that you recently took a dose of antibiotic, and have a normal urinalysis, I'm not sending the urine for culture. Continue to push fluids and stay well hydrated. Always be sure to wipe front to back and keep good hygiene, and avoid holding your urine for long periods of time.  You may use AZO if needed over a weekend (vs urgent care) to help with symptoms until you can be seen.  Return if you have worsening burning/pain with urination, odor, cloudiness, abdominal or flank pain. If you only have intermittent symptoms (without the odor, cloudy, etc), then it is possible some of your urinary symptoms are related to atrophy from being postmenopausal.  You should schedule follow-up either with your primary or your GYN to further discuss treatment options.

## 2023-09-03 ENCOUNTER — Other Ambulatory Visit: Payer: Self-pay | Admitting: Cardiovascular Disease

## 2023-09-03 DIAGNOSIS — I25118 Atherosclerotic heart disease of native coronary artery with other forms of angina pectoris: Secondary | ICD-10-CM

## 2023-09-03 DIAGNOSIS — E1169 Type 2 diabetes mellitus with other specified complication: Secondary | ICD-10-CM

## 2023-09-03 DIAGNOSIS — I350 Nonrheumatic aortic (valve) stenosis: Secondary | ICD-10-CM

## 2023-09-11 ENCOUNTER — Other Ambulatory Visit: Payer: Self-pay | Admitting: Cardiovascular Disease

## 2023-09-11 DIAGNOSIS — E1159 Type 2 diabetes mellitus with other circulatory complications: Secondary | ICD-10-CM

## 2023-09-21 DIAGNOSIS — Z09 Encounter for follow-up examination after completed treatment for conditions other than malignant neoplasm: Secondary | ICD-10-CM | POA: Diagnosis not present

## 2023-09-21 DIAGNOSIS — Z862 Personal history of diseases of the blood and blood-forming organs and certain disorders involving the immune mechanism: Secondary | ICD-10-CM | POA: Diagnosis not present

## 2023-09-21 DIAGNOSIS — R0789 Other chest pain: Secondary | ICD-10-CM | POA: Diagnosis not present

## 2023-09-21 DIAGNOSIS — R142 Eructation: Secondary | ICD-10-CM | POA: Diagnosis not present

## 2023-09-21 DIAGNOSIS — R1010 Upper abdominal pain, unspecified: Secondary | ICD-10-CM | POA: Diagnosis not present

## 2023-09-21 LAB — LAB REPORT - SCANNED: EGFR: 29

## 2023-09-22 ENCOUNTER — Other Ambulatory Visit: Payer: Self-pay

## 2023-09-22 DIAGNOSIS — R748 Abnormal levels of other serum enzymes: Secondary | ICD-10-CM

## 2023-09-25 ENCOUNTER — Ambulatory Visit: Payer: Self-pay | Admitting: Cardiovascular Disease

## 2023-09-25 NOTE — Telephone Encounter (Signed)
 Noticed on the lab results from May 29 that the creatinine (measure of kidney function) was a little worse than before. Has she been taking the furosemide  a lot recently? Does she have an upcoming follow-up with her nephrologist? Please wish her a belated happy birthday!  (Dr Alvis Ba)   She reports that she has not had to take Furosemide  all year. She had a UTI recently 08/30/23 while on [place] and feels like she still has one even though her PCP said she does not have one. She took a full course of Macrobid .   She reports that she does not have a nephrologist that she sees regulary. Only has seen one in the past for history of kidney stones. She reports that she drinks plenty of water and occasionally drinks cranberry juice, but that is all.   Informed her that I would give this information to Dr Alvis Ba and I will call her back with any recommendations. She verbalized understanding.    She said thank you for b-day wishes and hopes you had a good birthday as well!

## 2023-09-25 NOTE — Progress Notes (Signed)
 Let's get another urinalysis with reflex culture now, please. Continue to drink plenty of water. Repeat a BMET in a week, please.  One possible explanation for elevated creatinine and symptoms of UTI without other evidence for infection could be a partially obstructing kidney stone. If the is hematuria on the UA or the creatinine worsens further, recommend a renal ultrasound to look for hydronephrosis.

## 2023-09-26 ENCOUNTER — Ambulatory Visit
Admission: RE | Admit: 2023-09-26 | Discharge: 2023-09-26 | Disposition: A | Discharge status: Home / Self Care | Source: Ambulatory Visit

## 2023-09-26 DIAGNOSIS — I25118 Atherosclerotic heart disease of native coronary artery with other forms of angina pectoris: Secondary | ICD-10-CM | POA: Diagnosis not present

## 2023-09-26 DIAGNOSIS — I10 Essential (primary) hypertension: Secondary | ICD-10-CM | POA: Diagnosis not present

## 2023-09-26 DIAGNOSIS — D500 Iron deficiency anemia secondary to blood loss (chronic): Secondary | ICD-10-CM | POA: Diagnosis not present

## 2023-09-26 DIAGNOSIS — R748 Abnormal levels of other serum enzymes: Secondary | ICD-10-CM

## 2023-10-10 ENCOUNTER — Other Ambulatory Visit: Payer: Self-pay | Admitting: Cardiovascular Disease

## 2023-10-26 ENCOUNTER — Other Ambulatory Visit: Payer: Self-pay | Admitting: Internal Medicine

## 2023-11-06 ENCOUNTER — Ambulatory Visit: Admitting: Internal Medicine

## 2023-11-22 ENCOUNTER — Telehealth: Payer: Self-pay | Admitting: Cardiovascular Disease

## 2023-11-22 NOTE — Telephone Encounter (Signed)
 The medication in question was acetazolamide  and I would be happy to prescribe it, however 5500 feet of elevation should not be a problem.  Usually we worry about altitude sickness at 8000 feet or higher.

## 2023-11-22 NOTE — Telephone Encounter (Signed)
 Pt would like a c/b regarding medications for High Altitude travel. Please advise

## 2023-11-22 NOTE — Telephone Encounter (Signed)
 Spoke with pt regarding her questions about altitude medications. Pt stated Dr. Francyne had prescribed her altitude medication about 5 years ago that worked very well. Pt did not remember the name of the medication. Pt stated she is traveling to [place], [place] and plans to hike up to 5500 ft of elevation. Pt stated there was a warning for people who have high blood pressure to avoid this climb. Pt was wondering if Dr. Francyne had any suggestions. The patient was told her questions would be forwarded to Dr. Francyne and his nurse. Pt verbalized understanding. All questions if any were answered.

## 2023-11-23 NOTE — Telephone Encounter (Signed)
 Called and left detailed voicemail: The medication in question was acetazolamide  and I would be happy to prescribe it, however 5500 feet of elevation should not be a problem.  Usually we worry about altitude sickness at 8000 feet or higher.   Left call back number if she would still like it prescribed after Dr Croitoru's explanation.

## 2023-12-05 DIAGNOSIS — L304 Erythema intertrigo: Secondary | ICD-10-CM | POA: Diagnosis not present

## 2023-12-05 DIAGNOSIS — Z5181 Encounter for therapeutic drug level monitoring: Secondary | ICD-10-CM | POA: Diagnosis not present

## 2023-12-05 DIAGNOSIS — L409 Psoriasis, unspecified: Secondary | ICD-10-CM | POA: Diagnosis not present

## 2023-12-06 ENCOUNTER — Other Ambulatory Visit: Payer: Self-pay | Admitting: Internal Medicine

## 2023-12-06 LAB — COMPREHENSIVE METABOLIC PANEL WITH GFR: EGFR: 34

## 2023-12-08 ENCOUNTER — Ambulatory Visit: Payer: Self-pay

## 2023-12-08 NOTE — Telephone Encounter (Signed)
 Schedule an appt with Dr. Vita

## 2023-12-08 NOTE — Telephone Encounter (Signed)
 Patient is advised Urgent Care due to no availability in PCP office.   FYI Only or Action Required?: Action required by provider: update on patient condition.  Patient was last seen in primary care on 08/30/2023 by Randol Dawes, MD.  Called Nurse Triage reporting Urinary Frequency.  Symptoms began 3 days ago.  Interventions attempted: Rest, hydration, or home remedies.  Symptoms are: gradually worsening.  Triage Disposition: See Physician Within 24 Hours  Patient/caregiver understands and will follow disposition?: Unsure--Patient stated Thank you for your help. And immediately hung up prior to confirming that she was going to go to an Urgent Care for certain              Copied from CRM #[id number]. Topic: Clinical - Red Word Triage >> Dec 08, 2023  8:56 AM Graeme ORN wrote: Red Word that prompted transfer to Nurse Triage: Pain, burning frequency, and odor. Reason for Disposition  Urinating more frequently than usual (i.e., frequency) OR new-onset of the feeling of an urgent need to urinate (i.e., urgency)  Answer Assessment - Initial Assessment Questions Patient states that she wouldn't be able to come in for an office visit until after 2pm today due to a luncheon. There are no available appointments in the PCP office today. Patient is advised Urgent Care and given home care advise. Patient states Okay thank you for your help and hung up.    1. SYMPTOM: What's the main symptom you're concerned about? (e.g., frequency, incontinence)     Frequency, odor,  2. ONSET: When did the  frequency  start?     3 days ago 3. PAIN: Is there any pain? If Yes, ask: How bad is it? (Scale: 1-10; mild, moderate, severe)     8 4. CAUSE: What do you think is causing the symptoms?     --- 5. OTHER SYMPTOMS: Do you have any other symptoms? (e.g., blood in urine, fever, flank pain, pain with urination)     Pain, odor  Protocols used: Urinary Symptoms-A-AH

## 2023-12-08 NOTE — Telephone Encounter (Signed)
 Dr. Vita has a 15 min opening this morning

## 2023-12-14 ENCOUNTER — Encounter: Payer: Self-pay | Admitting: Family Medicine

## 2023-12-14 ENCOUNTER — Ambulatory Visit: Admitting: Family Medicine

## 2023-12-14 VITALS — BP 132/74 | HR 92 | Ht 64.0 in | Wt 184.6 lb

## 2023-12-14 DIAGNOSIS — L409 Psoriasis, unspecified: Secondary | ICD-10-CM | POA: Diagnosis not present

## 2023-12-14 DIAGNOSIS — N184 Chronic kidney disease, stage 4 (severe): Secondary | ICD-10-CM | POA: Diagnosis not present

## 2023-12-14 DIAGNOSIS — I2510 Atherosclerotic heart disease of native coronary artery without angina pectoris: Secondary | ICD-10-CM | POA: Diagnosis not present

## 2023-12-14 DIAGNOSIS — Z794 Long term (current) use of insulin: Secondary | ICD-10-CM

## 2023-12-14 DIAGNOSIS — N3000 Acute cystitis without hematuria: Secondary | ICD-10-CM | POA: Diagnosis not present

## 2023-12-14 DIAGNOSIS — E1122 Type 2 diabetes mellitus with diabetic chronic kidney disease: Secondary | ICD-10-CM

## 2023-12-14 LAB — POCT URINALYSIS DIP (PROADVANTAGE DEVICE)
Bilirubin, UA: NEGATIVE
Blood, UA: NEGATIVE
Glucose, UA: NEGATIVE mg/dL
Ketones, POC UA: NEGATIVE mg/dL
Leukocytes, UA: NEGATIVE
Nitrite, UA: POSITIVE — AB
Protein Ur, POC: 30 mg/dL — AB
Specific Gravity, Urine: 1.015
Urobilinogen, Ur: 0.2
pH, UA: 6 (ref 5.0–8.0)

## 2023-12-14 MED ORDER — SULFAMETHOXAZOLE-TRIMETHOPRIM 800-160 MG PO TABS: 1.0000 | ORAL_TABLET | Freq: Two times a day (BID) | ORAL | 0 refills | Status: DC

## 2023-12-14 NOTE — Progress Notes (Signed)
   Subjective:    Patient ID: Haley George, female    DOB: 06/27/1955, 68 y.o.   MRN: [id number]  Discussed the use of AI scribe software for clinical note transcription with the patient, who gave verbal consent to proceed.  History of Present Illness   Haley George is a 68 year old female with diabetes and chronic kidney disease who presents with bladder symptoms and concerns about kidney function.  She has been experiencing burning and discomfort associated with bladder symptoms, including urgency and a 'terrible odor' last week. No fever or other systemic symptoms are reported. She was unable to see the doctor earlier due to a communication issue with the appointment scheduling service.  Recent blood work, conducted in preparation for starting a biologic for psoriasis, showed a creatinine level of 1.67, which is an increase from 1.47 in November. She questions whether her urinary tract infection could affect her kidney function results. She has a history of diabetes.  She manages her diabetes with Lantus , which she takes regularly, but is less consistent with Humalog. She is considering using a continuous glucose monitor to better manage her blood sugar levels. She has also been taking Ozempic  but has reduced the dose due to tolerance issues.  Her psoriasis has been difficult to control, and she has been preparing to start a biologic treatment. Previous blood work showed high eosinophil levels, leading to a temporary halt in starting the biologic. Her skin condition improved after stopping all non-prescription supplements, but the psoriasis flared up again after a trip.  She has a history of autoimmune hepatitis from 1997, which presented as diabetes and was exacerbated by Glucophage , leading to a bilirubin count of 26. This history has made her cautious about starting new medications.  She is experiencing emotional distress related to the four-year anniversary of her husband's death, which  has affected her motivation to care for herself. 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           Review of Systems     Objective:    Physical Exam Alert and in no distress.  Urine dipstick was positive for nitrites           Assessment & Plan:  Assessment and Plan    Urinary tract infection Acute urinary tract infection with symptoms suggestive of bladder infection. - Prescribe Bactrim  for urinary tract infection.  Chronic kidney disease stage 4 Chronic kidney disease stage 4, likely secondary to long-standing type 2 diabetes mellitus. Recent increase in creatinine indicates worsening kidney function. Discussed diabetes control to prevent further kidney damage. - Recommend that she discuss with endocrinologist about starting Farxiga  for kidney preservation. - Consider referral to nephrologist if kidney function continues to decline. - Encourage good hydration and dietary modifications.  Type 2 diabetes mellitus Type 2 diabetes mellitus with suboptimal control, contributing to chronic kidney disease. Reports non-compliance with Humalog and difficulty tolerating Ozempic . Discussed benefits of CGM and potential switch to Mounjaro for better tolerance. - Recommend that she discuss with endocrinologist about starting CGM. - Discuss with endocrinologist about switching from Ozempic  to Mounjaro. - Encourage compliance with diabetes medications.  Psoriasis Severe psoriasis with consideration for biologic treatment. Discussed need for vaccinations, especially flu and RSV vaccines, before starting biologic treatment. - Administer RSV vaccine at pharmacy. - Administer flu vaccine when available in early September.

## 2023-12-15 ENCOUNTER — Other Ambulatory Visit: Payer: Self-pay | Admitting: Internal Medicine

## 2023-12-15 ENCOUNTER — Encounter: Payer: Self-pay | Admitting: Internal Medicine

## 2023-12-15 ENCOUNTER — Ambulatory Visit: Admitting: Internal Medicine

## 2023-12-15 ENCOUNTER — Telehealth: Payer: Self-pay | Admitting: Family Medicine

## 2023-12-15 VITALS — BP 122/70 | HR 89 | Ht 64.0 in | Wt 185.8 lb

## 2023-12-15 DIAGNOSIS — E118 Type 2 diabetes mellitus with unspecified complications: Secondary | ICD-10-CM | POA: Diagnosis not present

## 2023-12-15 DIAGNOSIS — E1159 Type 2 diabetes mellitus with other circulatory complications: Secondary | ICD-10-CM | POA: Diagnosis not present

## 2023-12-15 DIAGNOSIS — E66811 Obesity, class 1: Secondary | ICD-10-CM | POA: Diagnosis not present

## 2023-12-15 DIAGNOSIS — E6609 Other obesity due to excess calories: Secondary | ICD-10-CM

## 2023-12-15 DIAGNOSIS — E1169 Type 2 diabetes mellitus with other specified complication: Secondary | ICD-10-CM | POA: Diagnosis not present

## 2023-12-15 DIAGNOSIS — E785 Hyperlipidemia, unspecified: Secondary | ICD-10-CM

## 2023-12-15 DIAGNOSIS — Z794 Long term (current) use of insulin: Secondary | ICD-10-CM

## 2023-12-15 DIAGNOSIS — Z6834 Body mass index (BMI) 34.0-34.9, adult: Secondary | ICD-10-CM | POA: Diagnosis not present

## 2023-12-15 DIAGNOSIS — N184 Chronic kidney disease, stage 4 (severe): Secondary | ICD-10-CM

## 2023-12-15 LAB — POCT GLYCOSYLATED HEMOGLOBIN (HGB A1C): Hemoglobin A1C: 7.8 % — AB (ref 4.0–5.6)

## 2023-12-15 MED ORDER — FREESTYLE LIBRE 3 READER DEVI: 1.0000 | Freq: Once | 0 refills | Status: AC

## 2023-12-15 MED ORDER — FREESTYLE LIBRE 3 PLUS SENSOR MISC
1.0000 | 3 refills | Status: AC
Start: 2023-12-15 — End: ?

## 2023-12-15 MED ORDER — FREESTYLE LIBRE 3 PLUS SENSOR MISC: 1.0000 | 3 refills | Status: AC

## 2023-12-15 NOTE — Telephone Encounter (Signed)
 Copied from CRM [id number]. Topic: Referral - Request for Referral >> Dec 15, 2023  2:16 PM Debby BROCKS wrote: Did the patient discuss referral with their provider in the last year? No (If No - schedule appointment) (If Yes - send message)  Appointment offered? No  Type of order/referral and detailed reason for visit: For a neurologist   Preference of office, provider, location: NA  If referral order, have you been seen by this specialty before? No (If Yes, this issue or another issue? When? Where?  Can we respond through MyChart? Yes

## 2023-12-15 NOTE — Progress Notes (Signed)
 Patient ID: Haley George, female   DOB: [birth year], 68 y.o.   MRN: [id number]   HPI: Haley George is a 68 y.o.-year-old female, initially referred by her PCP, Dr. Joyce, returning for follow-up for DM2, dx initially in 1997 2/2 liver failure, then GDM, then re-occuring in 2007, insulin -dependent, uncontrolled, with long-term complications (CAD-s/p NSTEMI in 11/2019, s/p stents, aortic valve stenosis; CKD).  Last visit 8 mo ago.  Interim history: + increased urination, but no blurry vision, chest pain.   She was dx'ed with CKD stage 4 since last OV.  She had labs recently and the kidney function was lower than before (do not have these results).  She saw her PCP yesterday but she was not referred to nephrology yet.  Reviewed HbA1c levels: Lab Results  Component Value Date   HGBA1C 6.1 (A) 04/06/2023   HGBA1C 6.2 12/09/2022   HGBA1C 6.2 (A) 07/21/2022   HGBA1C 9.1 (A) 04/28/2022   HGBA1C 7.9 (A) 12/14/2021   HGBA1C 6.9 (A) 08/12/2021   HGBA1C 7.0 (A) 01/28/2021   HGBA1C 6.3 (A) 12/08/2020   HGBA1C 7.4 (H) 08/20/2020   HGBA1C 7.0 (A) 07/28/2020  05/01/2019: HbA1c 6.2% She was on steroid injections in 2019.   Pt is on a regimen of: - Metformin  1000 mg 2x a day, with meals - Levemir  140 (70 units x2) units at bedtime >> 70 units >> ...45 >> Lantus  50 >> 45 units at bedtime   - Ozempic  0.5 >> 1 >> 0.75 mg weekly -gastric discomfort/nausea >> 1 >> 0.75 >> 0.5 mg weekly  - Novolog  8-12 units 15 min before meals - started 04/2022 >> 10-12 >> 10-16 >> 8-12 >> 8-10 units 15 min before meals She was on Actos  before. She did not start Farxiga . In 04/2022, we stopped glipizide  and started NovoLog .  Pt checks her sugars seldom: - am: 154-239 >> 95-139, 143 >> 87-116 >> 91-110 >> 87-119, 130, 155 - 2h after b'fast: 192-238 >> n/c >> 131-188 >> n/c >> 131-188 - before lunch: 313 >> 108-131 >> 129, 138 >> 120-130 >> 129, 138, 160 - 2h after lunch:  209-349 >> 128-160 >> 163-212 >> n/c >>  167-212 - before dinner: 302 >> 110-119 >> 151, 170 >> 120-130 >> 151-170 - 2h after dinner: 309-582 >> .[id number] >> 175-190, 210s, 300 >> 169-228 - bedtime: 197-498 >> 132, 140 >> 170-188, 299 >> see above >> 159-178, 299 - nighttime: n/c Lowest sugar was 40 - 2012 >> ...   87 >> 70s x2 >> 87; she has hypoglycemia awareness in the 61s. Highest sugar was 582 >> 308 >> 299 >> 300 >> 299.  Glucometer: ReliOn  Pt's meals are: - Breakfast: if not skipping - egg + whole wheat toast; cheerios + milk - Lunch: chicken salad, apple, veggies, trisquits  - Dinner: meat + 2 veggies +/- rolls  In the past, she was on Otezla  for autoimmune hepatitis.  She could not tolerate it and had to stop.  She lost 24 pounds on it.  -+ CKD, last BUN/creatinine:  Had labs recently -records not available yet 09/21/2023: 36/1.91, GFR 29, glucose 94 Lab Results  Component Value Date   BUN 28 (H) 03/21/2023   BUN 22 01/24/2023   CREATININE 1.47 (H) 03/21/2023   CREATININE 1.30 (H) 01/24/2023   Lab Results  Component Value Date   MICRALBCREAT 38.8 03/21/2023   MICRALBCREAT 18.3 03/13/2017   MICRALBCREAT 58.3 07/12/2016   MICRALBCREAT 16.0 05/06/2014   MICRALBCREAT 62.9 04/22/2013  On Diovan  160.  -+ HL; last set of lipids: Lab Results  Component Value Date   CHOL 53 (L) 03/21/2023   HDL 21 (L) 03/21/2023   LDLCALC 2 03/21/2023   LDLDIRECT 93 11/15/2012   TRIG 191 (H) 03/21/2023   CHOLHDL 2.5 03/21/2023  On Crestor  20, fenofibrate , Vascepa .  Also on Repatha .  - last eye exam was on 04/25/2023: No DR.  - no numbness and tingling in her feet.  Last foot exam 07/21/2022.  Pt has FH of DM in M, B, M uncle, MGM.  She was admitted for chest pain 10/2018 after an episode of dehydration and had heart catheterization at that time >> had another stent and the previous stent was recanalized. She has a history of autoimmune hepatitis. Also, psoriasis, diagnosed as a child. She lost a signif. amount of  weight on [employer] D[employer] in 2018.   She has a history of vertigo, improved on meclizine . She had Covid in 02/2021 >> could not eat and had GI issues for 3 weeks afterward. She had palpitations after Covid >>  improved. She sees Dr. Francyne.  She has a h/o yeast infections. She got iron infusions in the past.  ROS: + see HPI  I reviewed pt's medications, allergies, PMH, social hx, family hx, and changes were documented in the history of present illness. Otherwise, unchanged from my initial visit note.  Past Medical History:  Diagnosis Date   Asthma    Cancer (HCC)    basal cell chest   Complication of anesthesia    Coronary artery disease    Diabetes mellitus without complication (HCC)    type 2   Heart murmur    Hepatitis    auto- immune   History of kidney stones    History of nuclear stress test 05/21/2010   dipyridamole; normal perfusion, preserved LV systolic EF of 66%   Hyperlipidemia    Hypertension    Liver failure (HCC)    h/o autoimmune hepatitis    Myocardial infarction (HCC)    Neuropathy    resolved with back surgery; tingling and burning left leg to foot   Obesity    PONV (postoperative nausea and vomiting)    Psoriasis    Tubular adenoma of colon    colon path 01/09/2017   Past Surgical History:  Procedure Laterality Date   Carotid Doppler  10/2008   R & L ICAs 0-49% diameter reduction    COLONOSCOPY     CORONARY ANGIOPLASTY WITH STENT PLACEMENT  09/2008   2.25x33mm Cypher DES to in-stent restenosis of prox LAD; Mini-Vision stent x2 2.0x52mm to area distal of initial LAD stent; 3 2.5x35mm Taxus stents prox to Cypher stent in circumflex   CORONARY ANGIOPLASTY WITH STENT PLACEMENT  09/2005   Cypher DES 3.0x48mm to distal RCA; 2.25x70mm Mini0Vision stent to mid LAD; 2.5x8mm Cypher stent to circumflex   CORONARY STENT INTERVENTION N/A 11/13/2018   Procedure: CORONARY STENT INTERVENTION;  Surgeon: Dann Candyce RAMAN, MD;  Location: [facility] INVASIVE CV LAB;  Service:  Cardiovascular;  Laterality: N/A;   CORONARY STENT INTERVENTION N/A 12/02/2019   Procedure: CORONARY STENT INTERVENTION;  Surgeon: Mady Bruckner, MD;  Location: [facility] INVASIVE CV LAB;  Service: Cardiovascular;  Laterality: N/A;   LEFT HEART CATH AND CORONARY ANGIOGRAPHY N/A 11/13/2018   Procedure: LEFT HEART CATH AND CORONARY ANGIOGRAPHY;  Surgeon: Dann Candyce RAMAN, MD;  Location: [facility] INVASIVE CV LAB;  Service: Cardiovascular;  Laterality: N/A;   LEFT HEART CATH AND CORONARY ANGIOGRAPHY N/A  12/02/2019   Procedure: LEFT HEART CATH AND CORONARY ANGIOGRAPHY;  Surgeon: Mady Bruckner, MD;  Location: [facility] INVASIVE CV LAB;  Service: Cardiovascular;  Laterality: N/A;   LUMBAR LAMINECTOMY/DECOMPRESSION MICRODISCECTOMY Left 10/18/2016   Procedure: Left Lumbar Four-Five Microdiscectomy;  Surgeon: Unice Pac, MD;  Location: [facility];  Service: Neurosurgery;  Laterality: Left;  Left L4-5 Microdiscectomy   ROTATOR CUFF REPAIR Left    SHOULDER ARTHROSCOPY W/ ROTATOR CUFF REPAIR Right    TRANSTHORACIC ECHOCARDIOGRAM  04/2010   EF=>55%, borderline conc LVH; trace MR & TR; AV mildly sclerotic; aortic root sclerosis/calcif   TRIGGER FINGER RELEASE Left 02/19/2020   Procedure: RELEASE TRIGGER FINGER/A-1 PULLEY;  Surgeon: Shari Sieving, MD;  Location: [facility] OR;  Service: Orthopedics;  Laterality: Left;   ureteral stents     Social History   Socioeconomic History   Marital status: Single    Spouse name: Not on file   Number of children: 1   Years of education: Not on file   Highest education level: Not on file  Occupational History    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  Social Needs   Financial resource strain: Not on file   Food insecurity    Worry: Not on file    Inability: Not on file   Transportation needs    Medical: Not on file    Non-medical: Not on file  Tobacco Use   Smoking status: Never Smoker   Smokeless tobacco: Never Used  Substance and Sexual  Activity   Alcohol use: No   Drug use: No   Sexual activity: Not Currently  Lifestyle   Physical activity    Days per week: Not on file    Minutes per session: Not on file   Stress: Not on file  Relationships   Social connections    Talks on phone: Not on file    Gets together: Not on file    Attends religious service: Not on file    Active member of club or organization: Not on file    Attends meetings of clubs or organizations: Not on file    Relationship status: Not on file   Intimate partner violence    Fear of current or ex partner: Not on file    Emotionally abused: Not on file    Physically abused: Not on file    Forced sexual activity: Not on file  Other Topics Concern   Not on file  Social History Narrative   Not on file   Current Outpatient Medications on File Prior to Visit  Medication Sig Dispense Refill   ascorbic acid (VITAMIN C) 500 MG tablet Take 500 mg by mouth daily. (Patient not taking: Reported on 12/14/2023)     bisoprolol  (ZEBETA ) 10 MG tablet Take 1 tablet (10mg ) by mouth daily in the evening. 90 tablet 3   bisoprolol  (ZEBETA ) 5 MG tablet Take 1 tablet (5mg ) by mouth daily in the morning. 90 tablet 3   clopidogrel  (PLAVIX ) 75 MG tablet TAKE 1 TABLET BY MOUTH EVERY DAY WITH BREAKFAST 90 tablet 2   Coenzyme Q10 300 MG CAPS Take 1 capsule (300 mg total) by mouth daily. (Patient not taking: Reported on 12/14/2023) 90 capsule 3   Cranberry 300 MG tablet Take 300 mg by mouth every evening. (Patient not taking: Reported on 12/14/2023)     Evolocumab  (REPATHA  SURECLICK) 140 MG/ML SOAJ INJECT 140 MG INTO THE SKIN EVERY 14 (FOURTEEN) DAYS. 6 mL 3   fenofibrate  (TRICOR ) 145 MG  tablet TAKE 1 TABLET (145 MG TOTAL) BY MOUTH EVERY EVENING 90 tablet 2   ferrous sulfate  324 (65 Fe) MG TBEC Take 1 tablet (324 mg total) by mouth in the morning and at bedtime. (Patient not taking: Reported on 12/14/2023) 180 tablet 3   fluticasone  furoate-vilanterol (BREO ELLIPTA ) 200-25 MCG/ACT  AEPB Inhale 1 puff into the lungs daily. 3 each 3   furosemide  (LASIX ) 20 MG tablet Take 20 mg on days weight is 185 or greater 30 tablet 2   glucosamine-chondroitin 500-400 MG tablet Take 1 tablet by mouth daily. (Patient not taking: Reported on 12/14/2023) 90 tablet 3   glucose blood (ONETOUCH VERIO) test strip Use to check blood sugar 2 times a day 200 each 12   icosapent  Ethyl (VASCEPA ) 1 g capsule Take 2 capsules (2 g total) by mouth 2 (two) times daily. 360 capsule 3   insulin  aspart (NOVOLOG  FLEXPEN) 100 UNIT/ML FlexPen Inject 10-16 Units into the skin 3 (three) times daily before meals.     insulin  glargine (LANTUS  SOLOSTAR) 100 UNIT/ML Solostar Pen Inject 45-50 Units into the skin daily. 45 mL 3   Insulin  Pen Needle (BD PEN NEEDLE NANO 2ND GEN) 32G X 4 MM MISC USE AS DIRECTED FOR INSULIN  ADMINISTRATION 300 each 3   isosorbide  mononitrate (IMDUR ) 60 MG 24 hr tablet TAKE 1 TABLET (60 MG TOTAL) BY MOUTH DAILY. ** DO NOT CRUSH ** 90 tablet 2   meclizine  (ANTIVERT ) 25 MG tablet Take 1 tablet (25 mg total) by mouth 3 (three) times daily as needed for dizziness. 30 tablet 11   metFORMIN  (GLUCOPHAGE ) 1000 MG tablet Take 1 tablet (1,000 mg total) by mouth 2 (two) times daily with a meal. 180 tablet 3   Multiple Vitamins-Minerals (CENTRUM SILVER 50+WOMEN PO) Take 1 tablet by mouth daily. (Patient not taking: Reported on 12/14/2023)     nitroGLYCERIN  (NITROSTAT ) 0.4 MG SL tablet For chest pain, tightness, or pressure. While sitting, place 1 tablet under tongue. May be used every 5 minutes as needed, for up to 15 minutes. Do not use more than 3 tablets. (Patient taking differently: Place 0.4 mg under the tongue as needed for chest pain. For chest pain, tightness, or pressure. While sitting, place 1 tablet under tongue. May be used every 5 minutes as needed, for up to 15 minutes. Do not use more than 3 tablets.) 25 tablet 3   nystatin cream (MYCOSTATIN) Apply topically as needed.     ReliOn Lancets Thin 26G  MISC 1 Units by Does not apply route 2 (two) times daily. E 11.9 100 each 12   rosuvastatin  (CRESTOR ) 20 MG tablet TAKE 1 TABLET BY MOUTH EVERY DAY 90 tablet 3   Semaglutide , 1 MG/DOSE, (OZEMPIC , 1 MG/DOSE,) 4 MG/3ML SOPN INJECT 1 MG INTO THE SKIN ONE TIME PER WEEK 9 mL 0   sulfamethoxazole -trimethoprim  (BACTRIM  DS) 800-160 MG tablet Take 1 tablet by mouth 2 (two) times daily. 20 tablet 0   valsartan  (DIOVAN ) 160 MG tablet TAKE 1 TABLET BY MOUTH EVERY DAY 90 tablet 2   No current facility-administered medications on file prior to visit.   Allergies  Allergen Reactions   Penicillins Anaphylaxis and Hives    PATIENT HAD A PCN REACTION WITH IMMEDIATE RASH, FACIAL/TONGUE/THROAT SWELLING, SOB, OR LIGHTHEADEDNESS WITH HYPOTENSION:  #  #  #  YES  #  #  #    Has patient had a PCN reaction causing severe rash involving mucus membranes or skin necrosis:Unknown  Has patient had a  PCN reaction that required hospitalization:No  Has patient had a PCN reaction occurring within the last 10 years:No  If all of the above answers are NO, then may proceed with Cephalosporin use.  PATIENT HAD A PCN REACTION WITH IMMEDIATE RASH, FACIAL/TONGUE/THROAT SWELLING, SOB, OR LIGHTHEADEDNESS WITH HYPOTENSION:  #  #  #  YES  #  #  #   Has patient had a PCN reaction causing severe rash involving mucus membranes or skin necrosis:UnknownHas patient had a PCN reaction that required hospitalization:No Has patient had a PCN reaction occurring within the last 10 years:No If all of the above answers are NO, then may proceed with Cephalosporin use.   Coconut Flavoring Agent (Non-Screening) Swelling    Mouth breaks out    Cocos Nucifera     Breaks out mouth   Other Swelling    Walnuts cause mouth to break out   Family History  Problem Relation Age of Onset   CAD Mother    CAD Father    Diabetes Brother    PE: BP 122/70   Pulse 89   Ht 5' 4 (1.626 m)   Wt 185 lb 12.8 oz (84.3 kg)   SpO2 99%   BMI 31.89 kg/m   Wt Readings from Last 3 Encounters:  12/15/23 185 lb 12.8 oz (84.3 kg)  12/14/23 184 lb 9.6 oz (83.7 kg)  08/30/23 179 lb 6.4 oz (81.4 kg)   Constitutional: overweight, in NAD Eyes: EOMI, no exophthalmos ENT: no thyromegaly, no cervical lymphadenopathy Cardiovascular: RRR, No RG, +2/6 SEM Respiratory: CTA B Musculoskeletal: no deformities Skin: + Generalized psoriatic rash Neurological: no tremor with outstretched hands Diabetic Foot Exam - Simple   Simple Foot Form Diabetic Foot exam was performed with the following findings: Yes 12/15/2023  1:58 PM  Visual Inspection No deformities, no ulcerations, no other skin breakdown bilaterally: Yes Sensation Testing Intact to touch and monofilament testing bilaterally: Yes Pulse Check Posterior Tibialis and Dorsalis pulse intact bilaterally: Yes Comments    ASSESSMENT: 1. DM2, insulin -dependent, uncontrolled, with long-term complications - CAD-s/p AMI, s/p stents, aortic valve stenosis - CKD  2. HL  3.  Obesity class I  PLAN:  1. Patient with longstanding type 2 diabetes, with improved control on a regimen containing metformin , GLP-1 receptor agonist, and basal/bolus insulin  regimen.  At last visit, HbA1c was excellent, at 6.1%, decreased so we continued the same regimen with the exception of decreasing the dose of Lantus  as she had some lower blood sugars when she was skipping meals. -She recently had a high lipase, of 99 in 08/2023.  No nausea, abdominal pain or other GI symptoms now.  She mentions that she was told that the high lipase was due to fatty liver and generalized liver inflammation, but not pancreatitis.  She continues on Ozempic , but on the lower dose, 0.5 mg weekly. -At today's visit she tells me that she was recently diagnosed with chronic kidney disease stage IV.  She had recent labs by Dr. Robinson but I do not have access to these.  She saw her PCP yesterday.  She tells me that she was not referred to nephrology yet.    At today's visit, we discussed that I definitely agree with a referral to nephrology.  I advised her to discuss with PCP about this.  I also recommended to stop metformin .  We can continue the same dose of Lantus  as sugars in the morning are at goal but she is missing NovoLog  later in the day and  whenever she takes them, she may take them after meals.  We discussed about the absolute importance of taking them 15 minutes before meals but I did not recommended to the doses for now, just trying to work on compliance. -I also sent a prescription for the freestyle libre CGM to her pharmacy and advised her how to use it. - I suggested to:  Patient Instructions  Please stop: - Metformin   Continue: - Ozempic  0.5 mg weekly - Lantus  50 units at bedtime  Try to take: - Novolog  8-12 units 15 min before meals  Please come back for a follow-up appointment in 4 months.  - we checked her HbA1c 7.8% (higher) - advised to check sugars at different times of the day - 4x a day, rotating check times - advised for yearly eye exams >> she is UTD - return to clinic in 4 months  2. HL - Latest lipid panel showed low LDL and HDL, and elevated triglycerides: Lab Results  Component Value Date   CHOL 53 (L) 03/21/2023   HDL 21 (L) 03/21/2023   LDLCALC 2 03/21/2023   LDLDIRECT 93 11/15/2012   TRIG 191 (H) 03/21/2023   CHOLHDL 2.5 03/21/2023  - She continues Crestor  20 mg daily, fenofibrate  145 mg daily and Vascepa  2 g twice a day along with Repatha .  3.  Obesity class I - She continue Ozempic  which should also help with weight loss.  She was not able to start SGLT2 inhibitor in the past. - She lost 1 pound before last visit and gained 6 pounds since then  Lela Fendt, MD PhD [facility] Endocrinology

## 2023-12-15 NOTE — Addendum Note (Signed)
 Addended by: CLEOTILDE ROLIN RAMAN on: 12/15/2023 03:12 PM   Modules accepted: Orders

## 2023-12-15 NOTE — Patient Instructions (Addendum)
 Please stop: - Metformin   Continue: - Ozempic  0.5 mg weekly - Lantus  50 units at bedtime  Try to take: - Novolog  8-12 units 15 min before meals  Please come back for a follow-up appointment in 4 months.

## 2023-12-18 NOTE — Telephone Encounter (Signed)
 Pt is requesting referral nephrologist . You discussed it at her visit last week and she was seeing Dr Ferd the next day   She saw Dr Ferd and she agreed that pt needs to go to nephrologist but she does not refer and would ask that you do referral

## 2023-12-19 NOTE — Telephone Encounter (Signed)
 Spoke with Pt to let her know she was referred to [facility] and ready to be scheduled. She will call to schedule today.

## 2024-01-11 ENCOUNTER — Encounter: Payer: Self-pay | Admitting: Dermatology

## 2024-01-16 ENCOUNTER — Telehealth: Payer: Self-pay

## 2024-01-16 NOTE — Telephone Encounter (Signed)
 Copied from CRM [id number]. Topic: Clinical - Medical Advice >> Jan 15, 2024  5:03 PM Delon HERO wrote: Reason for CRM: Patient is calling to ask if she needs flu shot, RSV. And asking does she need RSV? Need to start in regard to scheduling a biologic injection. Please advise

## 2024-01-17 ENCOUNTER — Ambulatory Visit: Payer: Self-pay | Admitting: Psychiatry

## 2024-01-17 DIAGNOSIS — F411 Generalized anxiety disorder: Secondary | ICD-10-CM

## 2024-01-17 NOTE — Progress Notes (Signed)
 [facility] Counsel[facility] Initial Adult Exam  Name: Haley George Date: 01/17/2024 MRN: [id number] DOB: Mar 19, 1956 PCP: Joyce Norleen BROCKS, MD  Time spent: 60 minutes   Guardian/Payee:  patient    Paperwork requested:  No   Reason for Visit /Presenting Problem:  Anxiety, depression (can be overwhelming), overwhelmed navigating life after my husband's death 4 years; is close to daughter age 66 and in [place], [place] . Daughter visits frequently. *Some anger about losing her husband. States I mourn the loss of a future and talked more about this later in our conversation. Some good insight on her situation and her journey through grief. Reports anxiety as primary symptom, closely followed by depression and grief.   Mental Status Exam:    Appearance:   Casual and Neat     Behavior:  Appropriate, Sharing, and Motivated  Motor:  Normal  Speech/Language:   Clear and Coherent  Affect:  Depressed  Mood:  depressed  Thought process:  goal directed  Thought content:    WNL  Sensory/Perceptual disturbances:    WNL  Orientation:  oriented to person, place, time/date, situation, day of week, month of year, year, and stated date of Sept. 24, 2025  Attention:  Good  Concentration:  Good  Memory:  WNL  Fund of knowledge:   Good  Insight:    Good and Fair  Judgment:   Good  Impulse Control:  Good   Reported Symptoms:  depression, overwhelmed and some anger that she lost her spouse  Risk Assessment: Danger to Self:  No Self-injurious Behavior: No Danger to Others: No Duty to Warn:no Physical Aggression / Violence:No  Access to Firearms a concern: No  Gang Involvement:No  Patient / guardian was educated about steps to take if suicide or homicide risk level increases between visits: yes While future psychiatric events cannot be accurately predicted, the patient does not currently require acute inpatient psychiatric care and does not currently meet [place]  involuntary commitment  criteria.  Substance Abuse History: Current substance abuse: No     Past Psychiatric History:   Previous psychological history is significant for marital and stress Outpatient Providers:n/a History of Psych Hospitalization: No  Psychological Testing: n/a   Abuse History: Victim of No., n/a   Report needed: No. Victim of Neglect:No. Perpetrator of n/a  Witness / Exposure to Domestic Violence: No   Protective Services Involvement: No  Witness to [employer] Violence:  No   Family History:  Patient confirms. Family History  Problem Relation Age of Onset   CAD Mother    CAD Father    Diabetes Brother     Living situation: the patient lives alone;  has close neighbors  Sexual Orientation:  Straight  Relationship Status: widowed  Name of spouse / other:n/a             If a parent, number of children / ages: 1 adult child age 34  Support Systems; friends lives alone  Surveyor, quantity Stress:  some stressors with house repairs  Income/Employment/Disability: Dance movement psychotherapist and Occupational psychologist Service: No   Educational History: Education: college graduate  Religion/Sprituality/World View:   Protestant  Any cultural differences that may affect / interfere with treatment:  not applicable   Recreation/Hobbies: civic involvement in [employer], school system activities, and involved with other retired persons/groups  Stressors:Health problems   Loss of husband 4 yrs ago.    Strengths:  Supportive Relationships, Family, Friends, Church, Spirituality, Journalist, newspaper, Able to Communicate Effectively, and trying to cultivate a  sense of health and more joy.  Barriers:   when she sees things that can't be changed like I can't get my husband back  Legal History: Pending legal issue / charges: The patient has no significant history of legal issues. History of legal issue / charges: none  Medical History/Surgical History:reviewed Past Medical History:  Diagnosis  Date   Asthma    Cancer (HCC)    basal cell chest   Complication of anesthesia    Coronary artery disease    Diabetes mellitus without complication (HCC)    type 2   Heart murmur    Hepatitis    auto- immune   History of kidney stones    History of nuclear stress test 05/21/2010   dipyridamole; normal perfusion, preserved LV systolic EF of 66%   Hyperlipidemia    Hypertension    Liver failure (HCC)    h/o autoimmune hepatitis    Myocardial infarction (HCC)    Neuropathy    resolved with back surgery; tingling and burning left leg to foot   Obesity    PONV (postoperative nausea and vomiting)    Psoriasis    Tubular adenoma of colon    colon path 01/09/2017    Past Surgical History:  Procedure Laterality Date   Carotid Doppler  10/2008   R & L ICAs 0-49% diameter reduction    COLONOSCOPY     CORONARY ANGIOPLASTY WITH STENT PLACEMENT  09/2008   2.25x57mm Cypher DES to in-stent restenosis of prox LAD; Mini-Vision stent x2 2.0x61mm to area distal of initial LAD stent; 3 2.5x28mm Taxus stents prox to Cypher stent in circumflex   CORONARY ANGIOPLASTY WITH STENT PLACEMENT  09/2005   Cypher DES 3.0x79mm to distal RCA; 2.25x54mm Mini0Vision stent to mid LAD; 2.5x47mm Cypher stent to circumflex   CORONARY STENT INTERVENTION N/A 11/13/2018   Procedure: CORONARY STENT INTERVENTION;  Surgeon: Dann Candyce RAMAN, MD;  [facility] INVASIVE CV LAB;  Service: Cardiovascular;  Laterality: N/A;   CORONARY STENT INTERVENTION N/A 12/02/2019   Procedure: CORONARY STENT INTERVENTION;  Surgeon: Mady Bruckner, MD;  Location: [facility] INVASIVE CV LAB;  Service: Cardiovascular;  Laterality: N/A;   LEFT HEART CATH AND CORONARY ANGIOGRAPHY N/A 11/13/2018   Procedure: LEFT HEART CATH AND CORONARY ANGIOGRAPHY;  Surgeon: Dann Candyce RAMAN, MD;  Location: [facility] INVASIVE CV LAB;  Service: Cardiovascular;  Laterality: N/A;   LEFT HEART CATH AND CORONARY ANGIOGRAPHY N/A 12/02/2019   Procedure: LEFT HEART CATH AND  CORONARY ANGIOGRAPHY;  Surgeon: Mady Bruckner, MD;  Location: [facility] INVASIVE CV LAB;  Service: Cardiovascular;  Laterality: N/A;   LUMBAR LAMINECTOMY/DECOMPRESSION MICRODISCECTOMY Left 10/18/2016   Procedure: Left Lumbar Four-Five Microdiscectomy;  Surgeon: Unice Pac, MD;  Location: [facility] OR;  Service: Neurosurgery;  Laterality: Left;  Left L4-5 Microdiscectomy   ROTATOR CUFF REPAIR Left    SHOULDER ARTHROSCOPY W/ ROTATOR CUFF REPAIR Right    TRANSTHORACIC ECHOCARDIOGRAM  04/2010   EF=>55%, borderline conc LVH; trace MR & TR; AV mildly sclerotic; aortic root sclerosis/calcif   TRIGGER FINGER RELEASE Left 02/19/2020   Procedure: RELEASE TRIGGER FINGER/A-1 PULLEY;  Surgeon: Shari Sieving, MD;  Location: [facility] OR;  Service: Orthopedics;  Laterality: Left;   ureteral stents      Medications: Current Outpatient Medications  Medication Sig Dispense Refill   ascorbic acid (VITAMIN C) 500 MG tablet Take 500 mg by mouth daily. (Patient not taking: Reported on 12/15/2023)     bisoprolol  (ZEBETA ) 10 MG tablet Take 1 tablet (10mg ) by mouth daily in  the evening. 90 tablet 3   bisoprolol  (ZEBETA ) 5 MG tablet Take 1 tablet (5mg ) by mouth daily in the morning. 90 tablet 3   clopidogrel  (PLAVIX ) 75 MG tablet TAKE 1 TABLET BY MOUTH EVERY DAY WITH BREAKFAST 90 tablet 2   Coenzyme Q10 300 MG CAPS Take 1 capsule (300 mg total) by mouth daily. (Patient not taking: Reported on 12/15/2023) 90 capsule 3   Continuous Glucose Sensor (FREESTYLE LIBRE 3 PLUS SENSOR) MISC 1 each by Does not apply route every 14 (fourteen) days. 6 each 3   Continuous Glucose Sensor (FREESTYLE LIBRE 3 PLUS SENSOR) MISC Inject 1 Device into the skin continuous. Change every 15 days 6 each 3   Cranberry 300 MG tablet Take 300 mg by mouth every evening. (Patient not taking: Reported on 12/15/2023)     Evolocumab  (REPATHA  SURECLICK) 140 MG/ML SOAJ INJECT 140 MG INTO THE SKIN EVERY 14 (FOURTEEN) DAYS. 6 mL 3   fenofibrate  (TRICOR ) 145 MG tablet TAKE  1 TABLET (145 MG TOTAL) BY MOUTH EVERY EVENING 90 tablet 2   ferrous sulfate  324 (65 Fe) MG TBEC Take 1 tablet (324 mg total) by mouth in the morning and at bedtime. (Patient not taking: Reported on 12/15/2023) 180 tablet 3   fluticasone  furoate-vilanterol (BREO ELLIPTA ) 200-25 MCG/ACT AEPB Inhale 1 puff into the lungs daily. 3 each 3   furosemide  (LASIX ) 20 MG tablet Take 20 mg on days weight is 185 or greater 30 tablet 2   glucosamine-chondroitin 500-400 MG tablet Take 1 tablet by mouth daily. (Patient not taking: Reported on 12/15/2023) 90 tablet 3   glucose blood (ONETOUCH VERIO) test strip Use to check blood sugar 2 times a day 200 each 12   icosapent  Ethyl (VASCEPA ) 1 g capsule Take 2 capsules (2 g total) by mouth 2 (two) times daily. 360 capsule 3   insulin  aspart (NOVOLOG  FLEXPEN) 100 UNIT/ML FlexPen Inject 10-16 Units into the skin 3 (three) times daily before meals. (Patient taking differently: Inject 10-16 Units into the skin 3 (three) times daily before meals.)     insulin  glargine (LANTUS  SOLOSTAR) 100 UNIT/ML Solostar Pen Inject 45-50 Units into the skin daily. 45 mL 3   Insulin  Pen Needle (BD PEN NEEDLE NANO 2ND GEN) 32G X 4 MM MISC USE AS DIRECTED FOR INSULIN  ADMINISTRATION 300 each 3   isosorbide  mononitrate (IMDUR ) 60 MG 24 hr tablet TAKE 1 TABLET (60 MG TOTAL) BY MOUTH DAILY. ** DO NOT CRUSH ** 90 tablet 2   meclizine  (ANTIVERT ) 25 MG tablet Take 1 tablet (25 mg total) by mouth 3 (three) times daily as needed for dizziness. 30 tablet 11   Multiple Vitamins-Minerals (CENTRUM SILVER 50+WOMEN PO) Take 1 tablet by mouth daily. (Patient not taking: Reported on 12/15/2023)     nitroGLYCERIN  (NITROSTAT ) 0.4 MG SL tablet For chest pain, tightness, or pressure. While sitting, place 1 tablet under tongue. May be used every 5 minutes as needed, for up to 15 minutes. Do not use more than 3 tablets. (Patient taking differently: Place 0.4 mg under the tongue as needed for chest pain. For chest pain,  tightness, or pressure. While sitting, place 1 tablet under tongue. May be used every 5 minutes as needed, for up to 15 minutes. Do not use more than 3 tablets.) 25 tablet 3   nystatin cream (MYCOSTATIN) Apply topically as needed.     ReliOn Lancets Thin 26G MISC 1 Units by Does not apply route 2 (two) times daily. E 11.9 100 each  12   rosuvastatin  (CRESTOR ) 20 MG tablet TAKE 1 TABLET BY MOUTH EVERY DAY 90 tablet 3   Semaglutide , 1 MG/DOSE, (OZEMPIC , 1 MG/DOSE,) 4 MG/3ML SOPN INJECT 1 MG INTO THE SKIN ONE TIME PER WEEK (Patient taking differently: 0.5 mg.) 9 mL 0   sulfamethoxazole -trimethoprim  (BACTRIM  DS) 800-160 MG tablet Take 1 tablet by mouth 2 (two) times daily. 20 tablet 0   valsartan  (DIOVAN ) 160 MG tablet TAKE 1 TABLET BY MOUTH EVERY DAY 90 tablet 2   No current facility-administered medications for this visit.    Allergies  Allergen Reactions   Penicillins Anaphylaxis and Hives    PATIENT HAD A PCN REACTION WITH IMMEDIATE RASH, FACIAL/TONGUE/THROAT SWELLING, SOB, OR LIGHTHEADEDNESS WITH HYPOTENSION:  #  #  #  YES  #  #  #    Has patient had a PCN reaction causing severe rash involving mucus membranes or skin necrosis:Unknown  Has patient had a PCN reaction that required hospitalization:No  Has patient had a PCN reaction occurring within the last 10 years:No  If all of the above answers are NO, then may proceed with Cephalosporin use.  PATIENT HAD A PCN REACTION WITH IMMEDIATE RASH, FACIAL/TONGUE/THROAT SWELLING, SOB, OR LIGHTHEADEDNESS WITH HYPOTENSION:  #  #  #  YES  #  #  #   Has patient had a PCN reaction causing severe rash involving mucus membranes or skin necrosis:UnknownHas patient had a PCN reaction that required hospitalization:No Has patient had a PCN reaction occurring within the last 10 years:No If all of the above answers are NO, then may proceed with Cephalosporin use.   Coconut Flavoring Agent (Non-Screening) Swelling    Mouth breaks out    Cocos Nucifera      Breaks out mouth   Other Swelling    Walnuts cause mouth to break out    Diagnoses:    ICD-10-CM   1. Generalized anxiety disorder  F41.1      Treatment goal plan of care: Worked collaboratively with patient on her treatment plan and she is in agreement with that.  Her goals will remain on treatment plan as patient works with strategies in sessions and outside of sessions to meet her goals.  Patient is signing a copy of her printed treatment goal plan instead of signing online.  She is keeping a copy of that treatment plan as well as therapist keeping a copy.  Progress will be assessed each session and documented in the progress or plan sections of treatment note.  1.  Identify life conflicts and/or situations including from the past and present, that support current symptomology of anxiety/depression. 2.  Develop behavioral and cognitive strategies to reduce or eliminate excessive anxiety, depression.  Identify, challenge, and replace negative self-talk with more positive, realistic, and empowering self-talk. 3.  Patient will work on developing reality based, positive cognitive messages that can help her improve her mood and outlook while also working on building/strengthening self-confidence.   Plan of Care:  This is the first appointment with this patient,Lucelia Wellington, for therapy and today we collaboratively completed her initial evaluation and initial treatment goal plan.  Lane is a 68 year old, widowed female, with husband dying 4 yrs ago, not really sure what happened but he did have a pacemaker. Also, Not sure if it was a stroke.  Patient denies any thoughts of self-harm.  Had been married 36 yrs and felt it was a good marriage with lots of give and take. Has good friends and close to her church  family. My Sunday School class has provided me a lot of care since husband's death. Does have a Pharmacist, community who she has had 19 yrs. Patient's parents have been deceased for years. Has 1  brother who is 5 yrs younger living in [place], [place] and they stay in touch although not real close. Having some psoriasis that she feels may be related to her grief, but I also had to issues with it as a child. Patient retired for 51yrs from the state. Was seen some earlier through [employer] at work.  Has some good connections through her church and former coworkers and neighbors.  Some brief marital/family counseling with she, husband, and daughter years ago. Reviewing together her treatment goals and plan today, in which she was actively involved.  Patient is wanting to work more through of grief issues regarding husband's death, ways that she can move forward feeling more confident in life now, nurturing herself better, focusing on her anxiety and depression, and continuing some of the good work that she has already tried to start but has trouble trying to deal with everything on her own, understandably.  Feels that anxiety, depression, and grief are her main symptoms.  Stated a couple times during their session today that I am mourning the loss of the future I had hoped for.  Is thankful that her daughter does not live too far away and they have frequent contact.  Was definitely more open the longer we spoke together today and seemed to feel excepted and encouraged.  Showing some good motivation especially the longer she spoke today. Patient participated well in session today giving some helpful information and also in determining best treatment goals to begin with as she starts therapy.  She does show some motivation especially in the areas of anxiety and depression.  Other information gathered during this initial evaluation that help support patient's need for therapy, can be found in the above sections of this initial evaluation.  Initial treatment goal review and patient is in agreement.  To return for next appointment within 2 weeks.   Barnie Bunde, LCSW

## 2024-01-17 NOTE — Telephone Encounter (Signed)
 Called Pt to let her know she will need shots, that she will need to speak with specialist that will perform biologic injection to find out if she needs to take shots before or after.

## 2024-01-18 ENCOUNTER — Telehealth: Payer: Self-pay | Admitting: Family Medicine

## 2024-01-18 NOTE — Telephone Encounter (Signed)
 Patient asks if you can please call her back. She is needing some approval for sky rizi and they are wanting approval from [facility] and she says they are asking for this to be done asap.

## 2024-01-18 NOTE — Telephone Encounter (Signed)
 Called Pt and spoke with her about timing for RSV and flue shots, Pt is aware and has all questions answered. Wait 2 weeks after flu and RSV before starting Skyrizi inj.

## 2024-01-23 DIAGNOSIS — N1832 Chronic kidney disease, stage 3b: Secondary | ICD-10-CM | POA: Diagnosis not present

## 2024-01-23 DIAGNOSIS — Z87442 Personal history of urinary calculi: Secondary | ICD-10-CM | POA: Diagnosis not present

## 2024-01-23 DIAGNOSIS — I129 Hypertensive chronic kidney disease with stage 1 through stage 4 chronic kidney disease, or unspecified chronic kidney disease: Secondary | ICD-10-CM | POA: Diagnosis not present

## 2024-01-23 DIAGNOSIS — E785 Hyperlipidemia, unspecified: Secondary | ICD-10-CM | POA: Diagnosis not present

## 2024-01-23 DIAGNOSIS — E1122 Type 2 diabetes mellitus with diabetic chronic kidney disease: Secondary | ICD-10-CM | POA: Diagnosis not present

## 2024-01-23 DIAGNOSIS — I2510 Atherosclerotic heart disease of native coronary artery without angina pectoris: Secondary | ICD-10-CM | POA: Diagnosis not present

## 2024-02-05 DIAGNOSIS — L409 Psoriasis, unspecified: Secondary | ICD-10-CM | POA: Diagnosis not present

## 2024-02-06 ENCOUNTER — Ambulatory Visit: Admitting: Psychiatry

## 2024-02-06 DIAGNOSIS — F411 Generalized anxiety disorder: Secondary | ICD-10-CM

## 2024-02-06 NOTE — Progress Notes (Signed)
 [facility] Counselor/Therapist Progress Note  Patient ID: Haley George, MRN: [id number],    Date: 02/06/2024  Time Spent: 53 minutes   Treatment Type: Individual Therapy  Reported Symptoms: anxiety, depression, depression, grief, overwhelmed navigating life after husband Ken's death 4 yrs ago, I mourn the loss of a future. Grateful that she is close to daughter, age 66 and in [place] ; does have metabolic issues and my diabetes   Mental Status Exam:  Appearance:   Casual and Neat     Behavior:  Appropriate, Sharing, and Motivated  Motor:  Normal  Speech/Language:   Clear and Coherent  Affect:  Depressed and some unresolved grieving  Mood:  anxious and depressed  Thought process:  goal directed  Thought content:    Rumination  Sensory/Perceptual disturbances:    WNL  Orientation:  oriented to person, place, time/date, situation, day of week, month of year, year, and stated date of Oct. 14, 2025  Attention:  Good  Concentration:  Good  Memory:  WNL  Fund of knowledge:   Good  Insight:    Good and Fair  Judgment:   Good  Impulse Control:  Good   Risk Assessment: Danger to Self:  No Self-injurious Behavior: No Danger to Others: No Duty to Warn:no Physical Aggression / Violence:No  Access to Firearms a concern: No  Gang Involvement:No   Subjective:  Patient in today after her initial evaluation last session. Having some skin issues with my disfiguring psoriasis. Wanting to heal and grow through her grief. I feel like a fraud because I'm still recovering in my grief but am involved in leading other grief groups. Talked through her fraud feelings to better understand how she can actually be of help in working with others in grief. Needing session today to talk through more of her grief and work on it in more intentional and positive ways. Friend encouraged her to come here for therapy as patient due to self-esteem issues along with grief over loss of husband and  so many stressors right now as she has some other immune system issues. Needing session today to further vent frustrations. Is concerned about taking medications re: psoriasis. Had yard sale recently to let go of some of deceased husband's belongings. Continues to deny any thoughts of self-harm. Staying in touch with friends who are supportive.  ** I don't do well staying in the present because I overly prepare for Plan B, Plan C, and Plan D. Being able to pivot is very important and especially when life happens.  Discussed these concepts a little more with patient which seemed helpful.  Important for her to nurture herself as she tries to nurture others, and receives support from others herself.  Continued work on her anxiety, depression, and grief and making some progress currently.  Wanting to build on this as she is aware that I am mourning the loss of the future I had hoped for.  Has relatively good relationship with her daughter and they are to see each other again within approximately a month as they are going to take a trip together.  Patient participated well in session today, showing increased insight and realizing how it is helping her when she helps others who are in similar circumstances regarding losses.  Interventions: Cognitive Behavioral Therapy, Solution-Oriented/Positive Psychology, and Ego-Supportive 1.  Identify life conflicts and/or situations including from the past and present, that support current symptomology of anxiety/depression. 2.  Develop behavioral and cognitive strategies to reduce or  eliminate excessive anxiety, depression.  Identify, challenge, and replace negative self-talk with more positive, realistic, and empowering self-talk. 3.  Patient will work on developing reality based, positive cognitive messages that can help her improve her mood and outlook while also working on building/strengthening self-confidence.   Diagnosis:   ICD-10-CM   1. Generalized anxiety  disorder  F41.1      Plan:   Patient in for session today after her initial evaluation last session.  Continues to make some progress.  Learning some about herself as she helps with a group of people that they themselves are also working through grief issues and are doing things socially together.  This group seems to be a good thing for patient to lead and it was her choice to do so.  Good motivation today and good work in session.  (Not all details included in this note due to patient privacy needs.)   Goal review and progress/challenges noted with patient.  Next appt within 2-3 weeks.   Barnie Bunde, LCSW

## 2024-02-22 ENCOUNTER — Ambulatory Visit: Admitting: Psychiatry

## 2024-02-22 DIAGNOSIS — F411 Generalized anxiety disorder: Secondary | ICD-10-CM

## 2024-02-22 NOTE — Progress Notes (Signed)
 [facility] Counselor/Therapist Progress Note  Patient ID: Haley George, MRN: [id number],    Date: 02/22/2024  Time Spent: 53 minutes   Treatment Type: Individual Therapy  Reported Symptoms: anxiety, depression, grief, overwhelmed navigating life after loss of husband and over circumstances which I have no control, I mourn the loss of a future   Mental Status Exam:  Appearance:   Well Groomed     Behavior:  Appropriate, Sharing, and Motivated  Motor:  Normal  Speech/Language:   Clear and Coherent  Affect:  Depressed and anxious  Mood:  anxious and depressed  Thought process:  goal directed  Thought content:    Rumination  Sensory/Perceptual disturbances:    WNL  Orientation:  oriented to person, place, time/date, situation, day of week, month of year, year, and stated date of Oct. 30, 2025  Attention:  Good  Concentration:  Good  Memory:  WNL  Fund of knowledge:   Good  Insight:    Good and Fair  Judgment:   Good  Impulse Control:  Good   Risk Assessment: Danger to Self:  No Self-injurious Behavior: No Danger to Others: No Duty to Warn:no Physical Aggression / Violence:No  Access to Firearms a concern: No  Gang Involvement:No   Subjective: Patient today feeling overwhelmed and over-extended right now with projects I'm working on before my trip with daughter. Involved in multiple activities at her church and beyond. I'm a perfectionist . I need to do a better job when I can't control everything. Today focusing more on her need for perfection, standards very high, and change is difficult even if it would make things easier to manage. Trying to make my expectations more realistic. Does have a large Open House. Encouraging patient to be more aware of the need for positive self-care.  Realizes there may be some traits she needs to work on but really doesn't want to make certain positive changes, which we discussed in more detail today and will continue next  session. (Especially feel she has to do everything for everybody, and everything has to be done a certain way to be correct.) Daughter is federal employee and on leave like others. Taking care of her diabetes is important. Very difficult to make change.  Difficult to imagine making changes so we are working through some blocks at this point and looking at some very needed changes that patient does agree with in part, and taking at least a few steps in a different direction towards more positive change for herself and be willing to accept help in making changes.  Interventions: Cognitive Behavioral Therapy, Solution-Oriented/Positive Psychology, and Ego-Supportive 1.  Identify life conflicts and/or situations including from the past and present, that support current symptomology of anxiety/depression. 2.  Develop behavioral and cognitive strategies to reduce or eliminate excessive anxiety, depression.  Identify, challenge, and replace negative self-talk with more positive, realistic, and empowering self-talk. 3.  Patient will work on developing reality based, positive cognitive messages that can help her improve her mood and outlook while also working on building/strengthening self-confidence.    Diagnosis:   ICD-10-CM   1. Generalized anxiety disorder  F41.1      Plan:  Patient today in session still working to improve motivation and certain areas, particularly and making some needed changes in order for her to feel better.  Very difficult to consider change and yet does not like how she is feeling at times.  Discussed this at length as noted above and  we will continue to work on this.  Homework assignment given after session today for patient to do some processing more about her reluctance towards change but also look at some changes that she knows might be healthier for her emotionally if she made them or at least began to work on them.  Seems to take some steps towards at least hearing things out in  session at times and considering how certain changes might impact her.  Continues to be involved helping lead a group of people within her church that are going through grief issues also.  Goal review and progress/challenges noted with patient.  Next appointment within 2 to 3 weeks.   Barnie Bunde, LCSW

## 2024-03-03 ENCOUNTER — Other Ambulatory Visit: Payer: Self-pay | Admitting: Cardiovascular Disease

## 2024-03-03 ENCOUNTER — Other Ambulatory Visit: Payer: Self-pay | Admitting: Internal Medicine

## 2024-03-05 ENCOUNTER — Other Ambulatory Visit: Payer: Self-pay | Admitting: Internal Medicine

## 2024-03-05 ENCOUNTER — Telehealth: Payer: Self-pay | Admitting: Nutrition

## 2024-03-05 DIAGNOSIS — E118 Type 2 diabetes mellitus with unspecified complications: Secondary | ICD-10-CM

## 2024-03-05 NOTE — Telephone Encounter (Signed)
 Pt. Very low on Novlog and needs you to call in [facility] on [address] ASAP for refill.  Please put in for a 90 day supply.  Thank you

## 2024-03-08 ENCOUNTER — Other Ambulatory Visit: Payer: Self-pay

## 2024-03-08 MED ORDER — NOVOLOG FLEXPEN 100 UNIT/ML ~~LOC~~ SOPN: PEN_INJECTOR | SUBCUTANEOUS | 2 refills | Status: DC

## 2024-03-08 NOTE — Telephone Encounter (Signed)
Novolog sent to pharmacy.

## 2024-03-11 ENCOUNTER — Telehealth: Payer: Self-pay

## 2024-03-11 ENCOUNTER — Ambulatory Visit: Admitting: Psychiatry

## 2024-03-11 NOTE — Telephone Encounter (Signed)
 Copied from CRM #[id number]. Topic: General - Other >> Mar 11, 2024 10:58 AM Roselie BROCKS wrote: Reason for CRM: Patient requests a call back from the provider  today regarding leaving the country and needs to speak to pcp first.

## 2024-03-13 ENCOUNTER — Ambulatory Visit: Admitting: Family Medicine

## 2024-03-13 ENCOUNTER — Encounter: Payer: Self-pay | Admitting: Family Medicine

## 2024-03-13 VITALS — BP 152/94 | HR 66

## 2024-03-13 DIAGNOSIS — R102 Pelvic and perineal pain unspecified side: Secondary | ICD-10-CM

## 2024-03-13 DIAGNOSIS — N369 Urethral disorder, unspecified: Secondary | ICD-10-CM

## 2024-03-13 MED ORDER — LIDOCAINE 5 % EX OINT: 1.0000 | TOPICAL_OINTMENT | CUTANEOUS | 0 refills | Status: DC | PRN

## 2024-03-13 NOTE — Progress Notes (Signed)
   Subjective:    Patient ID: Haley George, female    DOB: 1955/09/02, 68 y.o.   MRN: [id number]  HPI She is here for evaluation of a 10-day history of vaginal pain.  She describes it is mainly near the urethra.  No urinary symptoms, fever or chills.   Review of Systems     Objective:    Physical Exam  Vaginal exam does show the urethra to be slightly erythematous and edematous and there is also swelling and redness to the right aspect of the urethra.      Assessment & Plan:  Vaginal pain - Plan: lidocaine  (XYLOCAINE ) 5 % ointment, Ambulatory referral to Obstetrics / Gynecology  Urethral lesion - Plan: lidocaine  (XYLOCAINE ) 5 % ointment, Ambulatory referral to Obstetrics / Gynecology She is getting ready to go on a trip and I will therefore give her Xylocaine  to help with the pain and refer as it does seem to be an inflammatory issue as well as questionable lesion in the right urethral area.  She would like to see Dr. Cleotilde and I will make the referral.

## 2024-03-14 ENCOUNTER — Telehealth (HOSPITAL_BASED_OUTPATIENT_CLINIC_OR_DEPARTMENT_OTHER): Payer: Self-pay

## 2024-03-14 NOTE — Telephone Encounter (Signed)
 LM informing the patient we received a referral from her PCP and would like to schedule her to see Dr. Cleotilde. I asked for her to pleaes give us  a call back. [phone].

## 2024-03-25 ENCOUNTER — Telehealth: Payer: Self-pay

## 2024-03-25 NOTE — Telephone Encounter (Signed)
 Pharmacy Patient Advocate Encounter   Received notification from CoverMyMeds that prior authorization for lidocaine  (XYLOCAINE ) 5 % ointment  is required/requested.   Insurance verification completed.   The patient is insured through [employer].   Per test claim: PA required; PA submitted to above mentioned insurance via Latent Key/confirmation #/EOC [id number]  Status is pending

## 2024-03-25 NOTE — Telephone Encounter (Signed)
 Pharmacy Patient Advocate Encounter  Received notification from [employer] that Prior Authorization for Lidocaine  5% ointment has been DENIED.  Full denial letter will be uploaded to the media tab. See denial reason below.         PA #/Case ID/Reference #: [id number]

## 2024-04-02 ENCOUNTER — Encounter: Payer: Self-pay | Admitting: Nurse Practitioner

## 2024-04-02 ENCOUNTER — Ambulatory Visit (HOSPITAL_BASED_OUTPATIENT_CLINIC_OR_DEPARTMENT_OTHER): Admitting: Obstetrics & Gynecology

## 2024-04-02 ENCOUNTER — Ambulatory Visit: Admitting: Nurse Practitioner

## 2024-04-02 ENCOUNTER — Encounter (HOSPITAL_BASED_OUTPATIENT_CLINIC_OR_DEPARTMENT_OTHER): Payer: Self-pay | Admitting: Obstetrics & Gynecology

## 2024-04-02 VITALS — BP 132/82 | HR 68 | Wt 186.6 lb

## 2024-04-02 DIAGNOSIS — Z1331 Encounter for screening for depression: Secondary | ICD-10-CM | POA: Diagnosis not present

## 2024-04-02 DIAGNOSIS — J0140 Acute pansinusitis, unspecified: Secondary | ICD-10-CM | POA: Diagnosis not present

## 2024-04-02 DIAGNOSIS — N952 Postmenopausal atrophic vaginitis: Secondary | ICD-10-CM | POA: Diagnosis not present

## 2024-04-02 DIAGNOSIS — Z1231 Encounter for screening mammogram for malignant neoplasm of breast: Secondary | ICD-10-CM | POA: Diagnosis not present

## 2024-04-02 DIAGNOSIS — Z122 Encounter for screening for malignant neoplasm of respiratory organs: Secondary | ICD-10-CM | POA: Diagnosis not present

## 2024-04-02 DIAGNOSIS — R102 Pelvic and perineal pain unspecified side: Secondary | ICD-10-CM | POA: Diagnosis not present

## 2024-04-02 LAB — HM MAMMOGRAPHY

## 2024-04-02 MED ORDER — PREDNISOLONE 5 MG PO TABS: 10.0000 mg | ORAL_TABLET | Freq: Every day | ORAL | 0 refills | Status: DC

## 2024-04-02 MED ORDER — ESTRADIOL 0.01 % VA CREA: TOPICAL_CREAM | VAGINAL | 1 refills | Status: AC

## 2024-04-02 MED ORDER — FLUCONAZOLE 150 MG PO TABS: ORAL_TABLET | ORAL | 0 refills | Status: DC

## 2024-04-02 NOTE — Progress Notes (Unsigned)
   GYNECOLOGY  VISIT  CC:   Vaginal Pain   HPI: 68 y.o. [id number] Married White or Caucasian female here for vaginal pain that occurred . Reports external vaginal pain began 2 weeks ago. Pain began with wiping but progressed to pain with walking. Pain has subsided with lidocaine  ointment prescribed by PCP.  Denies vaginal bleeding or vaginal discharge.    No LMP recorded. Patient is postmenopausal.  Past Medical History:  Diagnosis Date   Asthma    Cancer (HCC)    basal cell chest   Complication of anesthesia    Coronary artery disease    Diabetes mellitus without complication (HCC)    type 2   Heart murmur    Hepatitis    auto- immune   History of kidney stones    History of nuclear stress test 05/21/2010   dipyridamole; normal perfusion, preserved LV systolic EF of 66%   Hyperlipidemia    Hypertension    Liver failure (HCC)    h/o autoimmune hepatitis    Myocardial infarction (HCC)    Neuropathy    resolved with back surgery; tingling and burning left leg to foot   Obesity    PONV (postoperative nausea and vomiting)    Psoriasis    Tubular adenoma of colon    colon path 01/09/2017    MEDS:  Reviewed in EPIC  ALLERGIES: Penicillins, Coconut flavoring agent (non-screening), Cocos nucifera, and Other  SH:  ***  ROS  PHYSICAL EXAMINATION:    BP (!) 148/83 (BP Location: Left Arm, Patient Position: Sitting, Cuff Size: Normal)   Pulse 70   Ht 5' 4 (1.626 m)   Wt 186 lb 9.6 oz (84.6 kg)   SpO2 98%   BMI 32.03 kg/m     General appearance: alert, cooperative and appears stated age Neck: no adenopathy, supple, symmetrical, trachea midline and thyroid {CHL AMB PHY EX THYROID NORM DEFAULT:[phone]::normal to inspection and palpation} CV:  {Exam; heart brief:31539} Lungs:  {pe lungs ob:314451} Breasts: {Exam; breast:13139::normal appearance, no masses or tenderness} Abdomen: soft, non-tender; bowel sounds normal; no masses,  no organomegaly Lymph:  no inguinal  LAD noted  Pelvic: External genitalia:  no lesions              Urethra:  normal appearing urethra with no masses, tenderness or lesions              Bartholins and Skenes: normal                 Vagina: {exam; pelvic vaginal:30846}              Cervix: {CHL AMB PHY EX CERVIX NORM [phone]::no lesions}              Bimanual Exam:  Uterus:  {CHL AMB PHY EX UTERUS NORM DEFAULT:[phone]::normal size, contour, position, consistency, mobility, non-tender}              Adnexa: {CHL AMB PHY EX ADNEXA NO MASS DEFAULT:[phone]::no mass, fullness, tenderness}              Rectovaginal: {yes no:314532}.  Confirms.              Anus:  normal sphincter tone, no lesions  Chaperone was present for exam.  Assessment/Plan: There are no diagnoses linked to this encounter.

## 2024-04-02 NOTE — Patient Instructions (Signed)
 I will send a message to the Lung Cancer screening team to make sure this is something covered by your insurance.    Sinus Infection, Adult A sinus infection, also called sinusitis, is inflammation of your sinuses. Sinuses are hollow spaces in the bones around your face. Your sinuses are located: Around your eyes. In the middle of your forehead. Behind your nose. In your cheekbones. Mucus normally drains out of your sinuses. When your nasal tissues become inflamed or swollen, mucus can become trapped or blocked. This allows bacteria, viruses, and fungi to grow, which leads to infection. Most infections of the sinuses are caused by a virus. A sinus infection can develop quickly. It can last for up to 4 weeks (acute) or for more than 12 weeks (chronic). A sinus infection often develops after a cold. What are the causes? This condition is caused by anything that creates swelling in the sinuses or stops mucus from draining. This includes: Allergies. Asthma. Infection from bacteria or viruses. Deformities or blockages in your nose or sinuses. Abnormal growths in the nose (nasal polyps). Pollutants, such as chemicals or irritants in the air. Infection from fungi. This is rare. What increases the risk? You are more likely to develop this condition if you: Have a weak body defense system (immune system). Do a lot of swimming or diving. Overuse nasal sprays. Smoke. What are the signs or symptoms? The main symptoms of this condition are pain and a feeling of pressure around the affected sinuses. Other symptoms include: Stuffy nose or congestion that makes it difficult to breathe through your nose. Thick yellow or greenish drainage from your nose. Tenderness, swelling, and warmth over the affected sinuses. A cough that may get worse at night. Decreased sense of smell and taste. Extra mucus that collects in the throat or the back of the nose (postnasal drip) causing a sore throat or bad  breath. Tiredness (fatigue). Fever. How is this diagnosed? This condition is diagnosed based on: Your symptoms. Your medical history. A physical exam. Tests to find out if your condition is acute or chronic. This may include: Checking your nose for nasal polyps. Viewing your sinuses using a device that has a light (endoscope). Testing for allergies or bacteria. Imaging tests, such as an MRI or CT scan. In rare cases, a bone biopsy may be done to rule out more serious types of fungal sinus disease. How is this treated? Treatment for a sinus infection depends on the cause and whether your condition is chronic or acute. If caused by a virus, your symptoms should go away on their own within 10 days. You may be given medicines to relieve symptoms. They include: Medicines that shrink swollen nasal passages (decongestants). A spray that eases inflammation of the nostrils (topical intranasal corticosteroids). Rinses that help get rid of thick mucus in your nose (nasal saline washes). Medicines that treat allergies (antihistamines). Over-the-counter pain relievers. If caused by bacteria, your health care provider may recommend waiting to see if your symptoms improve. Most bacterial infections will get better without antibiotic medicine. You may be given antibiotics if you have: A severe infection. A weak immune system. If caused by narrow nasal passages or nasal polyps, surgery may be needed. Follow these instructions at home: Medicines Take, use, or apply over-the-counter and prescription medicines only as told by your health care provider. These may include nasal sprays. If you were prescribed an antibiotic medicine, take it as told by your health care provider. Do not stop taking the antibiotic  even if you start to feel better. Hydrate and humidify  Drink enough fluid to keep your urine pale yellow. Staying hydrated will help to thin your mucus. Use a cool mist humidifier to keep the  humidity level in your home above 50%. Inhale steam for 10-15 minutes, 3-4 times a day, or as told by your health care provider. You can do this in the bathroom while a hot shower is running. Limit your exposure to cool or dry air. Rest Rest as much as possible. Sleep with your head raised (elevated). Make sure you get enough sleep each night. General instructions  Apply a warm, moist washcloth to your face 3-4 times a day or as told by your health care provider. This will help with discomfort. Use nasal saline washes as often as told by your health care provider. Wash your hands often with soap and water to reduce your exposure to germs. If soap and water are not available, use hand sanitizer. Do not smoke. Avoid being around people who are smoking (secondhand smoke). Keep all follow-up visits. This is important. Contact a health care provider if: You have a fever. Your symptoms get worse. Your symptoms do not improve within 10 days. Get help right away if: You have a severe headache. You have persistent vomiting. You have severe pain or swelling around your face or eyes. You have vision problems. You develop confusion. Your neck is stiff. You have trouble breathing. These symptoms may be an emergency. Get help right away. Call 911. Do not wait to see if the symptoms will go away. Do not drive yourself to the hospital. Summary A sinus infection is soreness and inflammation of your sinuses. Sinuses are hollow spaces in the bones around your face. This condition is caused by nasal tissues that become inflamed or swollen. The swelling traps or blocks the flow of mucus. This allows bacteria, viruses, and fungi to grow, which leads to infection. If you were prescribed an antibiotic medicine, take it as told by your health care provider. Do not stop taking the antibiotic even if you start to feel better. Keep all follow-up visits. This is important. This information is not intended to  replace advice given to you by your health care provider. Make sure you discuss any questions you have with your health care provider. Document Revised: 03/16/2021 Document Reviewed: 03/16/2021 Elsevier Patient Education  2024 [employer].

## 2024-04-02 NOTE — Progress Notes (Unsigned)
  Camie FORBES Doing, DNP, AGNP-c [facility] [address] [place], [place] [address] [phone]   ACUTE VISIT on 04/02/2024  Blood pressure 132/82, pulse 68, weight 186 lb 9.6 oz (84.6 kg), SpO2 98%.  Subjective:  HPI  History of Present Illness Haley George is a 68 year old female who presents with head congestion, sinus pain, and chest congestion.  She has been experiencing head congestion, sinus pain, and pressure, along with a cough and chest congestion that began approximately two weeks ago, the day before Thanksgiving. Initially, she developed a head cold while on a cruise and reported that her symptoms seemed to worsen over time. Symptoms include significant pressure in her ears, at times feeling completely closed, which was exacerbated during her flight home.  The chest congestion began about a week to ten days ago but seems to be resolving. However, the sinus-related symptoms persist, causing pressure, drainage, and a raw throat. She also reports a history of ear pressure, which has impacted her hearing, noting that she already has some hearing loss.  She is allergic to penicillins but has tolerated azithromycin  (Z-Pak) in the past. She has a Z-Pak at home but was hesitant to take it without a doctor's consultation. She mentions that she sometimes experiences yeast infections when taking antibiotics, though less frequently in recent years.  Her social history includes a recent cruise through the [place] from [place] to [place]. 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  ROS negative except for what is listed in HPI. History, Medications, Surgery, SDOH, and Family History reviewed and updated as appropriate.  Objective:  Physical Exam Constitutional:      Appearance: She is ill-appearing.  HENT:     Head: Normocephalic.     Right Ear: Swelling and  tenderness present. A middle ear effusion is present. Tympanic membrane is bulging.     Left Ear: Swelling and tenderness present. Tympanic membrane is bulging.     Nose:     Right Sinus: Maxillary sinus tenderness present.     Left Sinus: Maxillary sinus tenderness present.     Mouth/Throat:     Mouth: Mucous membranes are moist.     Pharynx: Uvula midline. Posterior oropharyngeal erythema and postnasal drip present. No uvula swelling.     Tonsils: No tonsillar exudate or tonsillar abscesses.  Eyes:     Pupils: Pupils are equal, round, and reactive to light.  Cardiovascular:     Rate and Rhythm: Normal rate and regular rhythm.     Pulses: Normal pulses.     Heart sounds: Murmur heard.  Pulmonary:     Effort: Pulmonary effort is normal.     Breath sounds: Normal breath sounds.  Skin:    General: Skin is warm and dry.  Neurological:     Mental Status: She is oriented to person, place, and time.     Motor: No weakness.  Psychiatric:        Mood and Affect: Mood normal.        Behavior: Behavior normal.         Assessment & Plan:   Problem List Items Addressed This Visit   None      Camie FORBES Doing, DNP, AGNP-c Time: *** minutes, >50% spent counseling, care coordination, chart review, and documentation.

## 2024-04-04 ENCOUNTER — Encounter: Payer: Self-pay | Admitting: Family Medicine

## 2024-04-07 ENCOUNTER — Encounter (HOSPITAL_BASED_OUTPATIENT_CLINIC_OR_DEPARTMENT_OTHER): Payer: Self-pay | Admitting: Obstetrics & Gynecology

## 2024-04-10 ENCOUNTER — Ambulatory Visit: Admitting: Internal Medicine

## 2024-04-11 NOTE — Progress Notes (Signed)
 GLORIAJEAN OKUN                                          MRN: [id number]   04/11/2024   The [facility] Quality Team Specialist reviewed this patient medical record for the purposes of chart review for care gap closure. The following were reviewed: chart review for care gap closure-kidney health evaluation for diabetes:uACR.    [facility]BCI Quality Team

## 2024-04-20 ENCOUNTER — Other Ambulatory Visit: Payer: Self-pay | Admitting: Internal Medicine

## 2024-04-24 ENCOUNTER — Other Ambulatory Visit (HOSPITAL_COMMUNITY): Payer: Self-pay

## 2024-05-01 ENCOUNTER — Encounter: Payer: Self-pay | Admitting: Cardiovascular Disease

## 2024-05-01 LAB — OPHTHALMOLOGY REPORT-SCANNED

## 2024-05-02 ENCOUNTER — Telehealth: Payer: Self-pay | Admitting: Cardiovascular Disease

## 2024-05-02 NOTE — Telephone Encounter (Signed)
 Pt wants to be seen soon but Dr. Francyne does not have any availability. Pt does not want to see APP. Please advise.

## 2024-05-02 NOTE — Telephone Encounter (Signed)
 Pt called in requesting to see Dr Francyne. Refused to see any of the APPs. Pt states she has been having some intermittent chest pain without cause and shortness of breath with activity that has been going on for months. Pt also states she and Dr Francyne spoke about this over the holidays. Will send to Dr Francyne to see if he has any openings available that I am unable to see and to scheduling. If not, will seek his advisement from here. Pt verbalizes understanding of plan.   Would like call-back on [phone], as patient stated she was leaving the house for a while.

## 2024-05-02 NOTE — Telephone Encounter (Signed)
 Left detailed message, per DPR. Pt appointment set for January 19 at 9:20am.

## 2024-05-02 NOTE — Telephone Encounter (Signed)
 Can we please put her in the 9:20 AM slot on January 19?

## 2024-05-06 ENCOUNTER — Ambulatory Visit: Admitting: Internal Medicine

## 2024-05-06 ENCOUNTER — Encounter: Payer: Self-pay | Admitting: Internal Medicine

## 2024-05-06 VITALS — BP 124/70 | HR 76 | Ht 64.0 in | Wt 188.8 lb

## 2024-05-06 DIAGNOSIS — E66811 Obesity, class 1: Secondary | ICD-10-CM

## 2024-05-06 DIAGNOSIS — E6609 Other obesity due to excess calories: Secondary | ICD-10-CM

## 2024-05-06 DIAGNOSIS — Z794 Long term (current) use of insulin: Secondary | ICD-10-CM | POA: Diagnosis not present

## 2024-05-06 DIAGNOSIS — E1169 Type 2 diabetes mellitus with other specified complication: Secondary | ICD-10-CM

## 2024-05-06 DIAGNOSIS — E785 Hyperlipidemia, unspecified: Secondary | ICD-10-CM | POA: Diagnosis not present

## 2024-05-06 DIAGNOSIS — E1159 Type 2 diabetes mellitus with other circulatory complications: Secondary | ICD-10-CM

## 2024-05-06 DIAGNOSIS — Z7985 Long-term (current) use of injectable non-insulin antidiabetic drugs: Secondary | ICD-10-CM | POA: Diagnosis not present

## 2024-05-06 DIAGNOSIS — Z6834 Body mass index (BMI) 34.0-34.9, adult: Secondary | ICD-10-CM

## 2024-05-06 LAB — POCT GLYCOSYLATED HEMOGLOBIN (HGB A1C): Hemoglobin A1C: 9.1 % — AB (ref 4.0–5.6)

## 2024-05-06 MED ORDER — NOVOLOG FLEXPEN 100 UNIT/ML ~~LOC~~ SOPN: PEN_INJECTOR | SUBCUTANEOUS | 2 refills | Status: AC

## 2024-05-06 MED ORDER — OZEMPIC (1 MG/DOSE) 4 MG/3ML ~~LOC~~ SOPN: 1.0000 mg | PEN_INJECTOR | SUBCUTANEOUS | 3 refills | Status: AC

## 2024-05-06 NOTE — Addendum Note (Signed)
 Addended by: CLEOTILDE ROLIN RAMAN on: 05/06/2024 04:50 PM   Modules accepted: Orders

## 2024-05-06 NOTE — Progress Notes (Signed)
 Patient ID: Haley George, female   DOB: 1956-03-25, 69 y.o.   MRN: [id number]   HPI: Haley George is a 69 y.o.-year-old female, initially referred by her PCP, Dr. Joyce, returning for follow-up for DM2, dx initially in 1997 2/2 liver failure, then GDM, then re-occuring in 2007, insulin -dependent, uncontrolled, with long-term complications (CAD-s/p NSTEMI in 11/2019, s/p stents, aortic valve stenosis; CKD).  Last visit 4.5 mo ago.  Interim history: She does mention chest pain that she we will see her cardiologist soon. She was dx'ed with CKD stage 4 before last OV.  Now seeing nephrology.   Since last OV, she went in a 90-day cruise to the [place] with her daughter. She was able to start a CGM since last visit; she had problems with multiple failed sensors.  Reviewed HbA1c levels: Lab Results  Component Value Date   HGBA1C 7.8 (A) 12/15/2023   HGBA1C 6.1 (A) 04/06/2023   HGBA1C 6.2 12/09/2022   HGBA1C 6.2 (A) 07/21/2022   HGBA1C 9.1 (A) 04/28/2022   HGBA1C 7.9 (A) 12/14/2021   HGBA1C 6.9 (A) 08/12/2021   HGBA1C 7.0 (A) 01/28/2021   HGBA1C 6.3 (A) 12/08/2020   HGBA1C 7.4 (H) 08/20/2020  05/01/2019: HbA1c 6.2% She was on steroid injections in 2019.   Pt is on a regimen of: - Metformin  1000 mg 2x a day, with meals >> stopped 11/2023 2/2 CKD - Levemir  140 (70 units x2) units at bedtime >> 70 units >> ...45 >> Lantus  50 >> 45  >> 50 units at bedtime   - Ozempic  0.5 >> 1 >> 0.75 mg weekly -gastric discomfort/nausea >> 1 >> 0.75 >> 0.5 mg weekly  - Novolog  8-12 units 15 min before meals - started 04/2022 >> 10-12 >> 10-16 >> 8-12 >> 8-10 >> 20-22 units 15 min before meals She was on Actos  before. She did not start Farxiga . In 04/2022, we stopped glipizide  and started NovoLog .  Pt checks her sugars >4x a day - Libre 3+:  Prev.: - am:  95-139, 143 >> 87-116 >> 91-110 >> 87-119, 130, 155 - 2h after b'fast: 192-238 >> n/c >> 131-188 >> n/c >> 131-188 - before lunch: 108-131 >>  129, 138 >> 120-130 >> 129, 138, 160 - 2h after lunch: 128-160 >> 163-212 >> n/c >> 167-212 - before dinner:110-119 >> 151, 170 >> 120-130 >> 151-170 - 2h after dinner: 168-228 >> 175-190, 210s, 300 >> 169-228 - bedtime:  170-188, 299 >> see above >> 159-178, 299 - nighttime: n/c Lowest sugar was 40 - 2012 >> ...   87 >> 70s x2 >> 87; she has hypoglycemia awareness in the 44s. Highest sugar was 582 >> 308 >> 299 >> 300 >> 299.  Glucometer: ReliOn  Pt's meals are: - Breakfast: if not skipping - egg + whole wheat toast; cheerios + milk - Lunch: chicken salad, apple, veggies, trisquits  - Dinner: meat + 2 veggies +/- rolls  In the past, she was on Otezla  for autoimmune hepatitis.  She could not tolerate it and had to stop.  She lost 24 pounds on it.  -+ CKD, last BUN/creatinine:  Had labs recently -records not available yet 09/21/2023: 36/1.91, GFR 29, glucose 94 Lab Results  Component Value Date   BUN 28 (H) 03/21/2023   BUN 22 01/24/2023   CREATININE 1.47 (H) 03/21/2023   CREATININE 1.30 (H) 01/24/2023   Lab Results  Component Value Date   MICRALBCREAT 38.8 03/21/2023   MICRALBCREAT 18.3 03/13/2017   MICRALBCREAT 58.3  07/12/2016   MICRALBCREAT 16.0 05/06/2014   MICRALBCREAT 62.9 04/22/2013  On Diovan  160.  -+ HL; last set of lipids: Lab Results  Component Value Date   CHOL 53 (L) 03/21/2023   HDL 21 (L) 03/21/2023   LDLCALC 2 03/21/2023   LDLDIRECT 93 11/15/2012   TRIG 191 (H) 03/21/2023   CHOLHDL 2.5 03/21/2023  On Crestor  20, fenofibrate , Vascepa .  Also on Repatha .  - last eye exam was on 05/01/2024: No DR.  - no numbness and tingling in her feet. Last foot exam 67/22/2025.  Pt has FH of DM in M, B, M uncle, MGM.  She was admitted for chest pain 10/2018 after an episode of dehydration and had heart catheterization at that time >> had another stent and the previous stent was recanalized. She has a history of autoimmune hepatitis. Also, psoriasis, diagnosed as a  child. She lost a signif. amount of weight on Health Dare in 2018.   She has a history of vertigo, improved on meclizine . She had Covid in 02/2021 >> could not eat and had GI issues for 3 weeks afterward. She had palpitations after Covid >>  improved. She sees Dr. Francyne.  She has a h/o yeast infections. She got iron infusions in the past.  ROS: + see HPI  I reviewed pt's medications, allergies, PMH, social hx, family hx, and changes were documented in the history of present illness. Otherwise, unchanged from my initial visit note.  Past Medical History:  Diagnosis Date   Asthma    Cancer (HCC)    basal cell chest   Complication of anesthesia    Coronary artery disease    Diabetes mellitus without complication (HCC)    type 2   Heart murmur    Hepatitis    auto- immune   History of kidney stones    History of nuclear stress test 05/21/2010   dipyridamole; normal perfusion, preserved LV systolic EF of 66%   Hyperlipidemia    Hypertension    Liver failure (HCC)    h/o autoimmune hepatitis    Myocardial infarction (HCC)    Neuropathy    resolved with back surgery; tingling and burning left leg to foot   Obesity    PONV (postoperative nausea and vomiting)    Psoriasis    Tubular adenoma of colon    colon path 01/09/2017   Past Surgical History:  Procedure Laterality Date   Carotid Doppler  10/2008   R & L ICAs 0-49% diameter reduction    COLONOSCOPY     CORONARY ANGIOPLASTY WITH STENT PLACEMENT  09/2008   2.25x36mm Cypher DES to in-stent restenosis of prox LAD; Mini-Vision stent x2 2.0x2mm to area distal of initial LAD stent; 3 2.5x81mm Taxus stents prox to Cypher stent in circumflex   CORONARY ANGIOPLASTY WITH STENT PLACEMENT  09/2005   Cypher DES 3.0x73mm to distal RCA; 2.25x48mm Mini0Vision stent to mid LAD; 2.5x57mm Cypher stent to circumflex   CORONARY STENT INTERVENTION N/A 11/13/2018   Procedure: CORONARY STENT INTERVENTION;  Surgeon: Dann Candyce RAMAN, MD;   Location: [facility] INVASIVE CV LAB;  Service: Cardiovascular;  Laterality: N/A;   CORONARY STENT INTERVENTION N/A 12/02/2019   Procedure: CORONARY STENT INTERVENTION;  Surgeon: Mady Bruckner, MD;  Location: [facility] INVASIVE CV LAB;  Service: Cardiovascular;  Laterality: N/A;   LEFT HEART CATH AND CORONARY ANGIOGRAPHY N/A 11/13/2018   Procedure: LEFT HEART CATH AND CORONARY ANGIOGRAPHY;  Surgeon: Dann Candyce RAMAN, MD;  Location: [facility] INVASIVE CV LAB;  Service: Cardiovascular;  Laterality: N/A;   LEFT HEART CATH AND CORONARY ANGIOGRAPHY N/A 12/02/2019   Procedure: LEFT HEART CATH AND CORONARY ANGIOGRAPHY;  Surgeon: Mady Bruckner, MD;  Location: [facility] INVASIVE CV LAB;  Service: Cardiovascular;  Laterality: N/A;   LUMBAR LAMINECTOMY/DECOMPRESSION MICRODISCECTOMY Left 10/18/2016   Procedure: Left Lumbar Four-Five Microdiscectomy;  Surgeon: Unice Pac, MD;  Location: [facility] OR;  Service: Neurosurgery;  Laterality: Left;  Left L4-5 Microdiscectomy   ROTATOR CUFF REPAIR Left    SHOULDER ARTHROSCOPY W/ ROTATOR CUFF REPAIR Right    TRANSTHORACIC ECHOCARDIOGRAM  04/2010   EF=>55%, borderline conc LVH; trace MR & TR; AV mildly sclerotic; aortic root sclerosis/calcif   TRIGGER FINGER RELEASE Left 02/19/2020   Procedure: RELEASE TRIGGER FINGER/A-1 PULLEY;  Surgeon: Shari Sieving, MD;  Location: [facility] OR;  Service: Orthopedics;  Laterality: Left;   ureteral stents     Social History   Socioeconomic History   Marital status: Single    Spouse name: Not on file   Number of children: 1   Years of education: Not on file   Highest education level: Not on file  Occupational History    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  Social Needs   Financial resource strain: Not on file   Food insecurity    Worry: Not on file    Inability: Not on file   Transportation needs    Medical: Not on file    Non-medical: Not on file  Tobacco Use   Smoking status: Never Smoker   Smokeless  tobacco: Never Used  Substance and Sexual Activity   Alcohol use: No   Drug use: No   Sexual activity: Not Currently  Lifestyle   Physical activity    Days per week: Not on file    Minutes per session: Not on file   Stress: Not on file  Relationships   Social connections    Talks on phone: Not on file    Gets together: Not on file    Attends religious service: Not on file    Active member of club or organization: Not on file    Attends meetings of clubs or organizations: Not on file    Relationship status: Not on file   Intimate partner violence    Fear of current or ex partner: Not on file    Emotionally abused: Not on file    Physically abused: Not on file    Forced sexual activity: Not on file  Other Topics Concern   Not on file  Social History Narrative   Not on file   Current Outpatient Medications on File Prior to Visit  Medication Sig Dispense Refill   BD PEN NEEDLE NANO 2ND GEN 32G X 4 MM MISC USE AS DIRECTED FOR INSULIN  ADMINISTRATION 300 each 3   bisoprolol  (ZEBETA ) 10 MG tablet Take 1 tablet (10mg ) by mouth daily in the evening. 90 tablet 3   bisoprolol  (ZEBETA ) 5 MG tablet Take 1 tablet (5mg ) by mouth daily in the morning. 90 tablet 3   clopidogrel  (PLAVIX ) 75 MG tablet TAKE 1 TABLET BY MOUTH EVERY DAY WITH BREAKFAST 90 tablet 2   Continuous Glucose Sensor (FREESTYLE LIBRE 3 PLUS SENSOR) MISC 1 each by Does not apply route every 14 (fourteen) days. 6 each 3   Continuous Glucose Sensor (FREESTYLE LIBRE 3 PLUS SENSOR) MISC Inject 1 Device into the skin continuous. Change every 15 days 6 each 3   estradiol  (ESTRACE ) 0.01 % CREA vaginal cream 1 gram vaginally twice  weekly 42.5 g 1   Evolocumab  (REPATHA  SURECLICK) 140 MG/ML SOAJ INJECT 140 MG INTO THE SKIN EVERY 14 (FOURTEEN) DAYS. 6 mL 3   fenofibrate  (TRICOR ) 145 MG tablet TAKE 1 TABLET (145 MG TOTAL) BY MOUTH EVERY EVENING 90 tablet 2   fluconazole  (DIFLUCAN ) 150 MG tablet Take 1 tablet at the first sign of symptoms  of yeast. Repeat in 3 days if symptoms are still present. 2 tablet 0   fluticasone  furoate-vilanterol (BREO ELLIPTA ) 200-25 MCG/ACT AEPB Inhale 1 puff into the lungs daily. 3 each 3   furosemide  (LASIX ) 20 MG tablet Take 20 mg on days weight is 185 or greater 30 tablet 2   glucose blood (ONETOUCH VERIO) test strip Use to check blood sugar 2 times a day 200 each 12   icosapent  Ethyl (VASCEPA ) 1 g capsule Take 2 capsules (2 g total) by mouth 2 (two) times daily. 360 capsule 3   insulin  aspart (NOVOLOG  FLEXPEN) 100 UNIT/ML FlexPen 8-12 units 15 min before meals 45 mL 2   insulin  glargine (LANTUS  SOLOSTAR) 100 UNIT/ML Solostar Pen Inject 45-50 Units into the skin daily. 45 mL 3   isosorbide  mononitrate (IMDUR ) 60 MG 24 hr tablet TAKE 1 TABLET (60 MG TOTAL) BY MOUTH DAILY. ** DO NOT CRUSH ** 90 tablet 0   lidocaine  (XYLOCAINE ) 5 % ointment Apply 1 Application topically as needed. 35.44 g 0   nitroGLYCERIN  (NITROSTAT ) 0.4 MG SL tablet For chest pain, tightness, or pressure. While sitting, place 1 tablet under tongue. May be used every 5 minutes as needed, for up to 15 minutes. Do not use more than 3 tablets. 25 tablet 3   nystatin cream (MYCOSTATIN) Apply topically as needed.     prednisoLONE  5 MG TABS tablet Take 2 tablets (10 mg total) by mouth daily. 10 tablet 0   ReliOn Lancets Thin 26G MISC 1 Units by Does not apply route 2 (two) times daily. E 11.9 100 each 12   rosuvastatin  (CRESTOR ) 20 MG tablet TAKE 1 TABLET BY MOUTH EVERY DAY 90 tablet 3   Semaglutide , 1 MG/DOSE, (OZEMPIC , 1 MG/DOSE,) 4 MG/3ML SOPN INJECT 1 MG INTO THE SKIN ONE TIME PER WEEK 9 mL 0   valsartan  (DIOVAN ) 160 MG tablet TAKE 1 TABLET BY MOUTH EVERY DAY 90 tablet 2   No current facility-administered medications on file prior to visit.   Allergies  Allergen Reactions   Penicillins Anaphylaxis and Hives    PATIENT HAD A PCN REACTION WITH IMMEDIATE RASH, FACIAL/TONGUE/THROAT SWELLING, SOB, OR LIGHTHEADEDNESS WITH HYPOTENSION:  #  #   #  YES  #  #  #    Has patient had a PCN reaction causing severe rash involving mucus membranes or skin necrosis:Unknown  Has patient had a PCN reaction that required hospitalization:No  Has patient had a PCN reaction occurring within the last 10 years:No  If all of the above answers are NO, then may proceed with Cephalosporin use.  PATIENT HAD A PCN REACTION WITH IMMEDIATE RASH, FACIAL/TONGUE/THROAT SWELLING, SOB, OR LIGHTHEADEDNESS WITH HYPOTENSION:  #  #  #  YES  #  #  #   Has patient had a PCN reaction causing severe rash involving mucus membranes or skin necrosis:UnknownHas patient had a PCN reaction that required hospitalization:No Has patient had a PCN reaction occurring within the last 10 years:No If all of the above answers are NO, then may proceed with Cephalosporin use.   Coconut Flavoring Agent (Non-Screening) Swelling    Mouth breaks out  Cocos Nucifera     Breaks out mouth   Other Swelling    Walnuts cause mouth to break out   Family History  Problem Relation Age of Onset   CAD Mother    CAD Father    Diabetes Brother    PE: BP 124/70   Pulse 76   Ht 5' 4 (1.626 m)   Wt 188 lb 12.8 oz (85.6 kg)   SpO2 98%   BMI 32.41 kg/m   Wt Readings from Last 3 Encounters:  05/06/24 188 lb 12.8 oz (85.6 kg)  04/02/24 186 lb 9.6 oz (84.6 kg)  04/02/24 186 lb 9.6 oz (84.6 kg)   Constitutional: overweight, in NAD Eyes: EOMI, no exophthalmos ENT: no thyromegaly, no cervical lymphadenopathy Cardiovascular: RRR, No RG, +2/6 SEM Respiratory: CTA B Musculoskeletal: no deformities Skin: + psoriatic rash Neurological: no tremor with outstretched hands  ASSESSMENT: 1. DM2, insulin -dependent, uncontrolled, with long-term complications - CAD-s/p AMI, s/p stents, aortic valve stenosis - CKD  2. HL  3.  Obesity class I  PLAN:  1. Patient with longstanding type 2 diabetes with improved control on a regimen containing metformin , GLP-1 receptor agonist and Basaglar  46  regimen in the past, with a nadir HbA1c of 6.1%, but with worsening control before last visit.  HbA1c was 7.8% at that time.  She attributed this to being diagnosed with chronic kidney disease stage IV.  We had to stop metformin  at last visit.  She is now seeing nephrology. - At last visit, I sent prescription for the freestyle libre CGM to her pharmacy and advised her how to use it.  We stopped metformin  and advised her to take NovoLog  15 min before each meal (not after) but I did not recommend to increase the doses. - At today's visit, she mentions that her blood sugars are quite high.  She was able to start the CGM since last visit. CGM interpretation: -At today's visit, we reviewed her CGM downloads: It appears that 49% of values are in target range (goal >70%), while 51%.  Are higher than 180 (goal <25%), and 0% are lower than 70 (goal <4%).  The calculated average blood sugar is 198.  The projected HbA1c for the next 3 months (GMI) is 8%. -Reviewing the CGM trends, sugars are quite high, increasing in the morning and then more significantly after lunch and dinner.  They do drop overnight.  The sugars did appear to have improved in the last 5 days except for 1 day in which she was traveling and she did not take enough insulin  before lunch.  She is currently taking more NovoLog  than recommended at last visit, which is appropriate states sugars are so high.  She does mention that she would be open to increasing the Ozempic  dose, so at today's visit we will try to increase the dose back to 1 mg weekly.  With the recent improvement in blood sugars, I did not recommend to increase Lantus , and we also discussed after increasing dose of Ozempic  to use slightly lower doses of NovoLog  before her meals.  She also needs to remember to inject 15 minutes before each meal. -She is interested in joining a diabetic class and I recommended the diabetes and nutrition centers upstairs.  If the diabetes class is not offered  anymore, I will refer back to diabetes educator.  I advised her to let me know. - I suggested to:  Patient Instructions  Please increase: - Ozempic  1 mg weekly  Continue: -  Lantus  50 units at bedtime  Use: - Novolog  16-22 units 15 min before meals  Stop upstairs and join the diabetes class - [phone].  Please come back for a follow-up appointment in 1.5-2 months.  - we checked her HbA1c: 9.1% (higher) - advised to check sugars at different times of the day - 4x a day, rotating check times - advised for yearly eye exams >> she is UTD - return to clinic in 3-4 months  2. HL - Latest lipid panel is from 02/2023: Low HDL and LDL, elevated triglycerides: Lab Results  Component Value Date   CHOL 53 (L) 03/21/2023   HDL 21 (L) 03/21/2023   LDLCALC 2 03/21/2023   LDLDIRECT 93 11/15/2012   TRIG 191 (H) 03/21/2023   CHOLHDL 2.5 03/21/2023  - She is on Crestor  20 mg daily, fenofibrate  145 mg daily and Vascepa  2 g twice a day along with Repatha .  3.  Obesity class I - She continues on Ozempic  which should also help with weight loss.  She was not able to start SGLT2 inhibitors in the past. - She gained 6 pounds before last visit, previously lost 1 - She gained 2 pounds since last visit.  Will increase Ozempic  dose at today's visit.  Lela Fendt, MD PhD [facility] Endocrinology

## 2024-05-06 NOTE — Patient Instructions (Addendum)
 Please increase: - Ozempic  1 mg weekly  Continue: - Lantus  50 units at bedtime  Use: - Novolog  16-22 units 15 min before meals  Stop upstairs and join the diabetes class - [phone].  Please come back for a follow-up appointment in 1.5-2 months.

## 2024-05-07 ENCOUNTER — Other Ambulatory Visit: Payer: Self-pay | Admitting: Internal Medicine

## 2024-05-07 ENCOUNTER — Encounter: Payer: Self-pay | Admitting: Family Medicine

## 2024-05-07 ENCOUNTER — Telehealth: Payer: Self-pay | Admitting: Internal Medicine

## 2024-05-07 ENCOUNTER — Ambulatory Visit: Payer: Medicare PPO | Admitting: Family Medicine

## 2024-05-07 VITALS — BP 134/88 | HR 77 | Ht 64.0 in | Wt 188.2 lb

## 2024-05-07 DIAGNOSIS — E785 Hyperlipidemia, unspecified: Secondary | ICD-10-CM

## 2024-05-07 DIAGNOSIS — I10 Essential (primary) hypertension: Secondary | ICD-10-CM

## 2024-05-07 DIAGNOSIS — Z794 Long term (current) use of insulin: Secondary | ICD-10-CM

## 2024-05-07 DIAGNOSIS — D500 Iron deficiency anemia secondary to blood loss (chronic): Secondary | ICD-10-CM

## 2024-05-07 DIAGNOSIS — E1122 Type 2 diabetes mellitus with diabetic chronic kidney disease: Secondary | ICD-10-CM | POA: Diagnosis not present

## 2024-05-07 DIAGNOSIS — Z0000 Encounter for general adult medical examination without abnormal findings: Secondary | ICD-10-CM | POA: Diagnosis not present

## 2024-05-07 DIAGNOSIS — E1159 Type 2 diabetes mellitus with other circulatory complications: Secondary | ICD-10-CM

## 2024-05-07 LAB — CBC WITH DIFFERENTIAL/PLATELET
Basophils Absolute: 0 x10E3/uL (ref 0.0–0.2)
Basos: 1 %
EOS (ABSOLUTE): 0.3 x10E3/uL (ref 0.0–0.4)
Eos: 8 %
Hematocrit: 41.1 % (ref 34.0–46.6)
Hemoglobin: 13.3 g/dL (ref 11.1–15.9)
Immature Grans (Abs): 0 x10E3/uL (ref 0.0–0.1)
Immature Granulocytes: 0 %
Lymphocytes Absolute: 0.8 x10E3/uL (ref 0.7–3.1)
Lymphs: 22 %
MCH: 30 pg (ref 26.6–33.0)
MCHC: 32.4 g/dL (ref 31.5–35.7)
MCV: 93 fL (ref 79–97)
Monocytes Absolute: 0.4 x10E3/uL (ref 0.1–0.9)
Monocytes: 9 %
Neutrophils Absolute: 2.3 x10E3/uL (ref 1.4–7.0)
Neutrophils: 60 %
Platelets: 233 x10E3/uL (ref 150–450)
RBC: 4.44 x10E6/uL (ref 3.77–5.28)
RDW: 12.2 % (ref 11.7–15.4)
WBC: 3.8 x10E3/uL (ref 3.4–10.8)

## 2024-05-07 LAB — COMPREHENSIVE METABOLIC PANEL WITH GFR
ALT: 24 IU/L (ref 0–32)
AST: 39 IU/L (ref 0–40)
Albumin: 4.3 g/dL (ref 3.9–4.9)
Alkaline Phosphatase: 54 IU/L (ref 49–135)
BUN/Creatinine Ratio: 21 (ref 12–28)
BUN: 32 mg/dL — ABNORMAL HIGH (ref 8–27)
Bilirubin Total: 0.4 mg/dL (ref 0.0–1.2)
CO2: 22 mmol/L (ref 20–29)
Calcium: 10.2 mg/dL (ref 8.7–10.3)
Chloride: 102 mmol/L (ref 96–106)
Creatinine, Ser: 1.55 mg/dL — ABNORMAL HIGH (ref 0.57–1.00)
Globulin, Total: 3.1 g/dL (ref 1.5–4.5)
Glucose: 183 mg/dL — ABNORMAL HIGH (ref 70–99)
Potassium: 5.1 mmol/L (ref 3.5–5.2)
Sodium: 138 mmol/L (ref 134–144)
Total Protein: 7.4 g/dL (ref 6.0–8.5)
eGFR: 37 mL/min/1.73 — ABNORMAL LOW

## 2024-05-07 LAB — LIPID PANEL
Chol/HDL Ratio: 4.8 ratio — ABNORMAL HIGH (ref 0.0–4.4)
Cholesterol, Total: 120 mg/dL (ref 100–199)
HDL: 25 mg/dL — ABNORMAL LOW
LDL Chol Calc (NIH): 46 mg/dL (ref 0–99)
Triglycerides: 319 mg/dL — ABNORMAL HIGH (ref 0–149)
VLDL Cholesterol Cal: 49 mg/dL — ABNORMAL HIGH (ref 5–40)

## 2024-05-07 LAB — POCT UA - MICROALBUMIN
Albumin/Creatinine Ratio, Urine, POC: 28.7
Creatinine, POC: 118.9 mg/dL
Microalbumin Ur, POC: 34.1 mg/L

## 2024-05-07 LAB — IRON,TIBC AND FERRITIN PANEL
Ferritin: 39 ng/mL (ref 15–150)
Iron Saturation: 28 % (ref 15–55)
Iron: 111 ug/dL (ref 27–139)
Total Iron Binding Capacity: 391 ug/dL (ref 250–450)
UIBC: 280 ug/dL (ref 118–369)

## 2024-05-07 MED ORDER — NITROGLYCERIN 0.4 MG SL SUBL: SUBLINGUAL_TABLET | SUBLINGUAL | 3 refills | Status: AC

## 2024-05-07 NOTE — Progress Notes (Signed)
 "  Chief Complaint  Patient presents with   Annual Exam    Cpe/ awv. 2 weeks before christmas, was sitting had a horrible pain going from behind ear to top of head, left a pain for about a week. Still having a dual headache occasionally. Having trouble with blood pressure control even with continous monitor. Starting first dose of Skyrizzi.      Subjective:   Haley CASLER is a 69 y.o. female who presents for a Medicare Annual Wellness Visit.  Visit info / Clinical Intake: Medicare Wellness Visit Type:: Subsequent Annual Wellness Visit Persons participating in visit and providing information:: patient Medicare Wellness Visit Mode:: In-person (required for WTM) Interpreter Needed?: No Pre-visit prep was completed: no AWV questionnaire completed by patient prior to visit?: no Living arrangements:: (!) lives alone Patient's Overall Health Status Rating: (!) fair Typical amount of pain: some Does pain affect daily life?: no Are you currently prescribed opioids?: no  Dietary Habits and Nutritional Risks How many meals a day?: 2 Eats fruit and vegetables daily?: yes Most meals are obtained by: preparing own meals; eating out In the last 2 weeks, have you had any of the following?: none Diabetic:: (!) yes Any non-healing wounds?: no How often do you check your BS?: continuous glucose monitor Would you like to be referred to a Nutritionist or for Diabetic Management? : no  Functional Status Activities of Daily Living (to include ambulation/medication): Independent Ambulation: Independent Medication Administration: Independent Home Management (perform basic housework or laundry): Independent Manage your own finances?: yes Primary transportation is: driving Concerns about vision?: no *vision screening is required for WTM* Concerns about hearing?: no  Fall Screening Falls in the past year?: 0 Number of falls in past year: 0 Was there an injury with Fall?: 0 Fall Risk Category  Calculator: 0 Patient Fall Risk Level: Low Fall Risk  Fall Risk Patient at Risk for Falls Due to: No Fall Risks Fall risk Follow up: Falls evaluation completed  Home and Transportation Safety: All rugs have non-skid backing?: yes All stairs or steps have railings?: yes Grab bars in the bathtub or shower?: (!) no Have non-skid surface in bathtub or shower?: yes Good home lighting?: yes Regular seat belt use?: yes Hospital stays in the last year:: no  Cognitive Assessment Difficulty concentrating, remembering, or making decisions? : no Will 6CIT or Mini Cog be Completed: yes What year is it?: 0 points What month is it?: 0 points Give patient an address phrase to remember (5 components): [address] About what time is it?: 0 points Count backwards from 20 to 1: 0 points Say the months of the year in reverse: 0 points Repeat the address phrase from earlier: 0 points 6 CIT Score: 0 points  Advance Directives (For Healthcare) Does Patient Have a Medical Advance Directive?: Yes Does patient want to make changes to medical advance directive?: No - Patient declined Type of Advance Directive: Healthcare Power of [place]; Living will; Out of facility DNR (pink MOST or yellow form) Copy of Healthcare Power of Attorney in Chart?: No - copy requested Copy of Living Will in Chart?: No - copy requested Out of facility DNR (pink MOST or yellow form) in Chart? (Ambulatory ONLY): No - copy requested  Reviewed/Updated  Reviewed/Updated: Reviewed All (Medical, Surgical, Family, Medications, Allergies, Care Teams, Patient Goals)    Allergies (verified) Penicillins, Coconut flavoring agent (non-screening), Cocos nucifera, and Other   Current Medications (verified) Outpatient Encounter Medications as of 05/07/2024  Medication Sig  BD PEN NEEDLE NANO 2ND GEN 32G X 4 MM MISC USE AS DIRECTED FOR INSULIN  ADMINISTRATION   bisoprolol  (ZEBETA ) 10 MG tablet Take 1 tablet (10mg ) by mouth daily in the  evening.   bisoprolol  (ZEBETA ) 5 MG tablet Take 1 tablet (5mg ) by mouth daily in the morning.   clopidogrel  (PLAVIX ) 75 MG tablet TAKE 1 TABLET BY MOUTH EVERY DAY WITH BREAKFAST   Continuous Glucose Sensor (FREESTYLE LIBRE 3 PLUS SENSOR) MISC 1 each by Does not apply route every 14 (fourteen) days.   Continuous Glucose Sensor (FREESTYLE LIBRE 3 PLUS SENSOR) MISC Inject 1 Device into the skin continuous. Change every 15 days   estradiol  (ESTRACE ) 0.01 % CREA vaginal cream 1 gram vaginally twice weekly   Evolocumab  (REPATHA  SURECLICK) 140 MG/ML SOAJ INJECT 140 MG INTO THE SKIN EVERY 14 (FOURTEEN) DAYS.   fenofibrate  (TRICOR ) 145 MG tablet TAKE 1 TABLET (145 MG TOTAL) BY MOUTH EVERY EVENING   fluticasone  furoate-vilanterol (BREO ELLIPTA ) 200-25 MCG/ACT AEPB Inhale 1 puff into the lungs daily.   furosemide  (LASIX ) 20 MG tablet Take 20 mg on days weight is 185 or greater   glucose blood (ONETOUCH VERIO) test strip Use to check blood sugar 2 times a day   icosapent  Ethyl (VASCEPA ) 1 g capsule Take 2 capsules (2 g total) by mouth 2 (two) times daily.   insulin  aspart (NOVOLOG  FLEXPEN) 100 UNIT/ML FlexPen Inject 16-22 units under skin 15 min before meals, 3x a day (Patient taking differently: Inject 18-22 units, before meals)   insulin  glargine (LANTUS  SOLOSTAR) 100 UNIT/ML Solostar Pen Inject 45-50 Units into the skin daily.   isosorbide  mononitrate (IMDUR ) 60 MG 24 hr tablet TAKE 1 TABLET (60 MG TOTAL) BY MOUTH DAILY. ** DO NOT CRUSH **   nystatin cream (MYCOSTATIN) Apply topically as needed.   ReliOn Lancets Thin 26G MISC 1 Units by Does not apply route 2 (two) times daily. E 11.9   rosuvastatin  (CRESTOR ) 20 MG tablet TAKE 1 TABLET BY MOUTH EVERY DAY   Semaglutide , 1 MG/DOSE, (OZEMPIC , 1 MG/DOSE,) 4 MG/3ML SOPN Inject 1 mg into the skin once a week.   valsartan  (DIOVAN ) 160 MG tablet TAKE 1 TABLET BY MOUTH EVERY DAY   [DISCONTINUED] nitroGLYCERIN  (NITROSTAT ) 0.4 MG SL tablet For chest pain, tightness,  or pressure. While sitting, place 1 tablet under tongue. May be used every 5 minutes as needed, for up to 15 minutes. Do not use more than 3 tablets.   nitroGLYCERIN  (NITROSTAT ) 0.4 MG SL tablet For chest pain, tightness, or pressure. While sitting, place 1 tablet under tongue. May be used every 5 minutes as needed, for up to 15 minutes. Do not use more than 3 tablets.   [DISCONTINUED] fluconazole  (DIFLUCAN ) 150 MG tablet Take 1 tablet at the first sign of symptoms of yeast. Repeat in 3 days if symptoms are still present. (Patient not taking: Reported on 05/07/2024)   [DISCONTINUED] lidocaine  (XYLOCAINE ) 5 % ointment Apply 1 Application topically as needed. (Patient not taking: Reported on 05/07/2024)   [DISCONTINUED] prednisoLONE  5 MG TABS tablet Take 2 tablets (10 mg total) by mouth daily. (Patient not taking: Reported on 05/07/2024)   No facility-administered encounter medications on file as of 05/07/2024.    History: Past Medical History:  Diagnosis Date   Asthma    Cancer (HCC)    basal cell chest   Complication of anesthesia    Coronary artery disease    Diabetes mellitus without complication (HCC)    type 2  Heart murmur    Hepatitis    auto- immune   History of kidney stones    History of nuclear stress test 05/21/2010   dipyridamole; normal perfusion, preserved LV systolic EF of 66%   Hyperlipidemia    Hypertension    Liver failure (HCC)    h/o autoimmune hepatitis    Myocardial infarction (HCC)    Neuropathy    resolved with back surgery; tingling and burning left leg to foot   Obesity    PONV (postoperative nausea and vomiting)    Psoriasis    Tubular adenoma of colon    colon path 01/09/2017   Past Surgical History:  Procedure Laterality Date   Carotid Doppler  10/2008   R & L ICAs 0-49% diameter reduction    COLONOSCOPY     CORONARY ANGIOPLASTY WITH STENT PLACEMENT  09/2008   2.25x82mm Cypher DES to in-stent restenosis of prox LAD; Mini-Vision stent x2 2.0x61mm to  area distal of initial LAD stent; 3 2.5x33mm Taxus stents prox to Cypher stent in circumflex   CORONARY ANGIOPLASTY WITH STENT PLACEMENT  09/2005   Cypher DES 3.0x82mm to distal RCA; 2.25x75mm Mini0Vision stent to mid LAD; 2.5x44mm Cypher stent to circumflex   CORONARY STENT INTERVENTION N/A 11/13/2018   Procedure: CORONARY STENT INTERVENTION;  Surgeon: Dann Candyce RAMAN, MD;  Location: [facility] INVASIVE CV LAB;  Service: Cardiovascular;  Laterality: N/A;   CORONARY STENT INTERVENTION N/A 12/02/2019   Procedure: CORONARY STENT INTERVENTION;  Surgeon: Mady Bruckner, MD;  Location: [facility] INVASIVE CV LAB;  Service: Cardiovascular;  Laterality: N/A;   LEFT HEART CATH AND CORONARY ANGIOGRAPHY N/A 11/13/2018   Procedure: LEFT HEART CATH AND CORONARY ANGIOGRAPHY;  Surgeon: Dann Candyce RAMAN, MD;  Location: [facility] INVASIVE CV LAB;  Service: Cardiovascular;  Laterality: N/A;   LEFT HEART CATH AND CORONARY ANGIOGRAPHY N/A 12/02/2019   Procedure: LEFT HEART CATH AND CORONARY ANGIOGRAPHY;  Surgeon: Mady Bruckner, MD;  Location: [facility] INVASIVE CV LAB;  Service: Cardiovascular;  Laterality: N/A;   LUMBAR LAMINECTOMY/DECOMPRESSION MICRODISCECTOMY Left 10/18/2016   Procedure: Left Lumbar Four-Five Microdiscectomy;  Surgeon: Unice Pac, MD;  Location: [facility] OR;  Service: Neurosurgery;  Laterality: Left;  Left L4-5 Microdiscectomy   ROTATOR CUFF REPAIR Left    SHOULDER ARTHROSCOPY W/ ROTATOR CUFF REPAIR Right    TRANSTHORACIC ECHOCARDIOGRAM  04/2010   EF=>55%, borderline conc LVH; trace MR & TR; AV mildly sclerotic; aortic root sclerosis/calcif   TRIGGER FINGER RELEASE Left 02/19/2020   Procedure: RELEASE TRIGGER FINGER/A-1 PULLEY;  Surgeon: Shari Sieving, MD;  Location: [facility] OR;  Service: Orthopedics;  Laterality: Left;   ureteral stents     Family History  Problem Relation Age of Onset   CAD Mother    CAD Father    Diabetes Brother    Social History   Occupational History    Employer: [employer]   Tobacco Use   Smoking status: Never   Smokeless tobacco: Never  Vaping Use   Vaping status: Never Used  Substance and Sexual Activity   Alcohol use: No   Drug use: No   Sexual activity: Not Currently    Birth control/protection: Post-menopausal   Tobacco Counseling Counseling given: Not Answered  SDOH Screenings   Food Insecurity: No Food Insecurity (03/21/2023)  Housing: Low Risk (03/21/2023)  Transportation Needs: No Transportation Needs (03/21/2023)  Utilities: Not At Risk (03/21/2023)  Depression (PHQ2-9): Medium Risk (05/07/2024)  Financial Resource Strain: Low Risk (03/21/2023)  Physical Activity: Inactive (03/21/2023)  Social Connections: Moderately Integrated (03/21/2023)  Stress: Stress Concern Present (03/21/2023)  Tobacco Use: Low Risk (05/07/2024)   See flowsheets for full screening details  Depression Screen PHQ 2 & 9 Depression Scale- Over the past 2 weeks, how often have you been bothered by any of the following problems? Little interest or pleasure in doing things: 1 Feeling down, depressed, or hopeless (PHQ Adolescent also includes...irritable): 1 PHQ-2 Total Score: 2 Trouble falling or staying asleep, or sleeping too much: 2 Feeling tired or having little energy: 1 Poor appetite or overeating (PHQ Adolescent also includes...weight loss): 1 Feeling bad about yourself - or that you are a failure or have let yourself or your family down: 1 Trouble concentrating on things, such as reading the newspaper or watching television (PHQ Adolescent also includes...like school work): 0 Moving or speaking so slowly that other people could have noticed. Or the opposite - being so fidgety or restless that you have been moving around a lot more than usual: 0 Thoughts that you would be better off dead, or of hurting yourself in some way: 0 PHQ-9 Total Score: 7     Goals Addressed             This Visit's Progress    Patient Stated       Get better control over  sugars.              Objective:    Today's Vitals   05/07/24 0943  BP: (!) 156/92  Pulse: 77  SpO2: 97%  Weight: 188 lb 3.2 oz (85.4 kg)  Height: 5' 4 (1.626 m)   Body mass index is 32.3 kg/m.  Hearing/Vision screen No results found. Immunizations and Health Maintenance Health Maintenance  Topic Date Due   Diabetic kidney evaluation - Urine ACR  03/20/2024   COVID-19 Vaccine (6 - 2025-26 season) 05/23/2024 (Originally 12/25/2023)   HEMOGLOBIN A1C  11/03/2024   Diabetic kidney evaluation - eGFR measurement  12/05/2024   FOOT EXAM  12/14/2024   OPHTHALMOLOGY EXAM  05/01/2025   Medicare Annual Wellness (AWV)  05/07/2025   Mammogram  04/02/2026   DTaP/Tdap/Td (5 - Td or Tdap) 07/13/2026   Colonoscopy  09/07/2032   Pneumococcal Vaccine: 50+ Years  Completed   Influenza Vaccine  Completed   Bone Density Scan  Completed   Hepatitis C Screening  Completed   Zoster Vaccines- Shingrix   Completed   Meningococcal B Vaccine  Aged Out   Hepatitis B Vaccines 19-59 Average Risk  Discontinued        Assessment/Plan:  This is a routine wellness examination for Tanette.  Patient Care Team: Joyce Norleen BROCKS, MD as PCP - General (Family Medicine) Croitoru, Jerel, MD as PCP - Cardiology (Cardiology) Rosalie Kitchens, MD as Consulting Physician (Gastroenterology)  I have personally reviewed and noted the following in the patients chart:   Medical and social history Use of alcohol, tobacco or illicit drugs  Current medications and supplements including opioid prescriptions. Functional ability and status Nutritional status Physical activity Advanced directives List of other physicians Hospitalizations, surgeries, and ER visits in previous 12 months Vitals Screenings to include cognitive, depression, and falls Referrals and appointments  Orders Placed This Encounter  Procedures   Comprehensive metabolic panel   CBC with Differential   Lipid Panel   Iron, TIBC and Ferritin Panel    POCT UA - Microalbumin   In addition, I have reviewed and discussed with patient certain preventive protocols, quality metrics, and best practice recommendations. A written personalized care plan for preventive services  as well as general preventive health recommendations were provided to patient.  >=15 minutes spent in face-to-face counseling focused on reducing cardiovascular risk.  Topics reviewed: - Assessment of individual CVD risk factors (BP, lipids, glucose, weight, activity level) - Lifestyle modifications including heart-healthy diet, sodium reduction, and increased physical activity - Smoking cessation and alcohol moderation when applicable - Importance of blood pressure control, lipid optimization, and medication adherence - Personalized risk-reduction plan discussed and agreed upon    Reyne DELENA Bustle, MD   05/07/2024   No follow-ups on file.  After Visit Summary: (In Person-Printed) AVS printed and given to the patient  Nurse Notes: none.  "

## 2024-05-07 NOTE — Patient Instructions (Addendum)
 VISIT SUMMARY:  During your visit, we discussed your recent head pain, cardiac issues, diabetes management, and other health concerns. We reviewed your medications and planned follow-ups with your specialists.  YOUR PLAN:  -TYPE 2 DIABETES MELLITUS WITH STAGE 4 CHRONIC KIDNEY DISEASE: This condition involves high blood sugar levels and significant kidney damage. We discussed the possibility of switching to Mounjaro for better blood sugar control and fewer side effects. Please discuss this with your endocrinologist at your next appointment. We will monitor your A1c and weight changes at your follow-up.  -PRIMARY HYPERTENSION: This condition involves high blood pressure. We noted elevated blood pressure readings, possibly due to stress. Continue monitoring your blood pressure at home and discuss management with your cardiologist. We may consider increasing your valsartan  dosage if your blood pressure remains high.  -AORTIC STENOSIS: This condition involves narrowing of the aortic valve, which can cause chest discomfort and shortness of breath. Follow up with your cardiologist to evaluate your symptoms and determine if further testing is needed.  -IRON DEFICIENCY ANEMIA: This condition involves low iron levels, which can cause symptoms like craving ice. We ordered a complete blood count (CBC) to assess your iron status and eosinophil levels. We will consider iron supplementation based on the lab results.  -HYPERLIPIDEMIA: This condition involves high cholesterol levels. We ordered a cholesterol panel to monitor your cholesterol levels.  -PSORIASIS: This is a skin condition that causes red, scaly patches. You are currently managing it with [employer]. We ordered a CBC to monitor your eosinophil levels and you should continue with Skyrizi as planned.  -HEARING LOSS: You have been diagnosed with hearing loss and have an appointment scheduled for a hearing aid fitting. Please proceed with your audiologist  appointment.  -CATARACT, RIGHT EYE: This condition involves clouding of the eye's lens, leading to vision problems. You are planning to have surgery later this year.  -GENERAL HEALTH MAINTENANCE: We discussed routine health maintenance and ordered several tests, including a CBC, CMP, cholesterol panel, and checked your urine albumin levels.  INSTRUCTIONS:  Please follow up with your endocrinologist to discuss the potential switch to Mounjaro. Continue monitoring your blood pressure at home and discuss management with your cardiologist. Follow up with your cardiologist for evaluation of your aortic stenosis symptoms. Proceed with your audiologist appointment for hearing aid fitting. Plan for cataract surgery later in the year. We will review your lab results and consider iron supplementation based on the findings.

## 2024-05-07 NOTE — Telephone Encounter (Signed)
 NDM has scheduled patient to see Rock Dasen on 01.20.26 and has requested a referral be placed for the patient

## 2024-05-07 NOTE — Progress Notes (Signed)
 "  Name: Haley George   Date of Visit: 05/07/2024   Date of last visit with me: Visit date not found   CHIEF COMPLAINT:  Chief Complaint  Patient presents with   Annual Exam    Cpe/ awv. 2 weeks before christmas, was sitting had a horrible pain going from behind ear to top of head, left a pain for about a week. Still having a dual headache occasionally. Having trouble with blood pressure control even with continous monitor. Starting first dose of Skyrizzi.        HPI:  Discussed the use of AI scribe software for clinical note transcription with the patient, who gave verbal consent to proceed.  History of Present Illness   Haley George is a 69 year old female with cardiac issues and diabetes who presents for an annual physical exam and evaluation of recent head pain.  Two weeks before Christmas, she experienced a sudden, severe pain while sitting and looking at her phone. The pain originated around her neck and radiated into her head, centering around her temple. It was intense and left her sore to chew for about a week. Since then, she has had dull headaches in the same area, but not the sharp pain experienced initially. No recurrence of the severe head pain, only residual dull headaches. No history of migraines, except for one treated episode in the 1980s.  She has a history of cardiac issues, including aortic stenosis, and reports intermittent chest discomfort and shortness of breath. She is scheduled to see her cardiologist next week. Her last echocardiogram was about a year ago. She has not had a nuclear stress test in about five years. She takes blood pressure medications, including valsartan  and bisoprolol , and reports high blood pressure readings, particularly in stressful situations.  She has diabetes and is currently on Ozempic , which she started years ago and initially tolerated well, losing weight. However, after having COVID, she became more sensitive to nausea, a side effect  of Ozempic , and reduced her dose from one unit to half. She plans to return to one unit this week. She reports poor blood sugar control and recent adjustments made by her endocrinologist.  She has a cataract in her right eye and is considering surgery later in the year. She also has a special hearing loss and is scheduled to get a hearing aid next week.  She has a history of low iron levels, previously requiring infusions. She is not currently taking iron supplements due to elevated eosinophils and has stopped all non-prescribed supplements. She enjoys 'crunching ice' again, which she associates with low iron levels.  She has psoriasis and is on Skyrizi, which required stopping all non-prescribed supplements due to elevated eosinophils.         OBJECTIVE:       05/07/2024    9:40 AM  Depression screen PHQ 2/9  Decreased Interest 1  Down, Depressed, Hopeless 1  PHQ - 2 Score 2  Altered sleeping 2  Tired, decreased energy 1  Change in appetite 1  Feeling bad or failure about yourself  1  Trouble concentrating 0  Moving slowly or fidgety/restless 0  Suicidal thoughts 0  PHQ-9 Score 7     BP Readings from Last 3 Encounters:  05/07/24 (!) 156/92  05/06/24 124/70  04/02/24 (!) 148/83    BP (!) 156/92   Pulse 77   Ht 5' 4 (1.626 m)   Wt 188 lb 3.2 oz (85.4 kg)   SpO2  97%   BMI 32.30 kg/m    Physical Exam          Physical Exam  ASSESSMENT/PLAN:   Assessment & Plan Encounter for Medicare annual wellness exam  Annual physical exam  Primary hypertension  Type 2 diabetes mellitus with stage 4 chronic kidney disease, with long-term current use of insulin  (HCC)  Hyperlipidemia, unspecified hyperlipidemia type  Iron deficiency anemia due to chronic blood loss    Assessment and Plan    Type 2 diabetes mellitus with stage 4 chronic kidney disease Poor glycemic control with nausea from Ozempic . Discussed potential switch to Mounjaro for improved efficacy and side  effect profile. Medicare covers Mounjaro for diabetes. - Discuss potential switch to Mounjaro with endocrinologist at next appointment. - Monitor A1c and weight changes at next endocrinology follow-up.  Primary hypertension Elevated blood pressure noted, possibly situational. Current treatment includes valsartan  and bisoprolol . Dosage adjustment considered if hypertension persists. - Monitor blood pressure at home. - Discuss blood pressure management with cardiologist at upcoming appointment. - Consider increasing valsartan  dosage if blood pressure remains high.  Aortic stenosis Intermittent chest discomfort and dyspnea. Last echocardiogram showed no significant changes. No recent nuclear stress test. - Follow up with cardiologist for evaluation of symptoms and potential need for further testing.  Iron deficiency anemia Pica symptoms suggest low iron. Previous iron infusions. Current labs to assess iron status. - Ordered CBC to assess eosinophil levels and iron status. - Consider iron supplementation based on lab results.  Hyperlipidemia Routine cholesterol monitoring. - Ordered cholesterol panel.  Psoriasis Managed with Skyrizi. Previous elevated eosinophil levels led to cessation of non-prescribed supplements. - Ordered CBC to monitor eosinophil levels. - Continue Skyrizi as planned.  Hearing loss Special hearing loss identified. Audiologist appointment scheduled for hearing aid fitting. - Proceed with audiologist appointment for hearing aid fitting.  Cataract, right eye Cataract identified in right eye. Surgery planned later in the year. - Plan for cataract surgery later in the year.  General Health Maintenance Routine health maintenance discussed. - Ordered CBC, CMP, and cholesterol panel. - Checked urine albumin levels.  - Comprehensive annual physical exam completed today. Reviewed interval history, current medical issues, medications, allergies, and preventive care  needs. Addressed all patient questions and concerns. Discussed lifestyle factors including diet, exercise, sleep, and stress management. Reviewed recommended age-appropriate screenings, labs, and vaccinations. Counseling provided on healthy habits and routine health maintenance. Follow-up as indicated based on findings and results.         Makayah Pauli A. Vita MD [facility] "

## 2024-05-08 ENCOUNTER — Ambulatory Visit: Payer: Self-pay | Admitting: Family Medicine

## 2024-05-13 ENCOUNTER — Ambulatory Visit: Attending: Cardiovascular Disease | Admitting: Cardiovascular Disease

## 2024-05-13 VITALS — BP 152/96 | HR 87 | Ht 64.0 in | Wt 188.8 lb

## 2024-05-13 DIAGNOSIS — R0602 Shortness of breath: Secondary | ICD-10-CM | POA: Diagnosis not present

## 2024-05-13 DIAGNOSIS — I350 Nonrheumatic aortic (valve) stenosis: Secondary | ICD-10-CM | POA: Diagnosis not present

## 2024-05-13 DIAGNOSIS — Z6834 Body mass index (BMI) 34.0-34.9, adult: Secondary | ICD-10-CM

## 2024-05-13 DIAGNOSIS — I10 Essential (primary) hypertension: Secondary | ICD-10-CM

## 2024-05-13 DIAGNOSIS — E1159 Type 2 diabetes mellitus with other circulatory complications: Secondary | ICD-10-CM | POA: Diagnosis not present

## 2024-05-13 DIAGNOSIS — N1832 Chronic kidney disease, stage 3b: Secondary | ICD-10-CM

## 2024-05-13 DIAGNOSIS — E785 Hyperlipidemia, unspecified: Secondary | ICD-10-CM

## 2024-05-13 DIAGNOSIS — F329 Major depressive disorder, single episode, unspecified: Secondary | ICD-10-CM

## 2024-05-13 DIAGNOSIS — I25118 Atherosclerotic heart disease of native coronary artery with other forms of angina pectoris: Secondary | ICD-10-CM

## 2024-05-13 DIAGNOSIS — I5032 Chronic diastolic (congestive) heart failure: Secondary | ICD-10-CM

## 2024-05-13 DIAGNOSIS — J4530 Mild persistent asthma, uncomplicated: Secondary | ICD-10-CM

## 2024-05-13 DIAGNOSIS — E6609 Other obesity due to excess calories: Secondary | ICD-10-CM

## 2024-05-13 DIAGNOSIS — D500 Iron deficiency anemia secondary to blood loss (chronic): Secondary | ICD-10-CM | POA: Diagnosis not present

## 2024-05-13 DIAGNOSIS — E1169 Type 2 diabetes mellitus with other specified complication: Secondary | ICD-10-CM | POA: Diagnosis not present

## 2024-05-13 DIAGNOSIS — L409 Psoriasis, unspecified: Secondary | ICD-10-CM

## 2024-05-13 DIAGNOSIS — E66811 Obesity, class 1: Secondary | ICD-10-CM | POA: Diagnosis not present

## 2024-05-13 MED ORDER — BISOPROLOL FUMARATE 10 MG PO TABS: 10.0000 mg | ORAL_TABLET | Freq: Two times a day (BID) | ORAL | 3 refills | Status: AC

## 2024-05-13 NOTE — Progress Notes (Signed)
 Patient ID: Haley George, female   DOB: 1955-12-23, 69 y.o.   MRN: [id number]     Cardiology Office Note    Date:  05/18/2024   ID:  Haley George, DOB 12-15-1955, MRN [id number]  PCP:  Haley Morrow, MD  Cardiologist:   Haley Balding, MD   Chief Complaint  Patient presents with   Coronary Artery Disease    History of Present Illness:  Haley George is a 69 y.o. female with diabetes mellitus type II, severe mixed hyperlipidemia, CAD requiring PCI in all 3 major coronary arteries including redo PCI for restenosis, moderate calcific aortic stenosis, chronic diastolic heart failure, mild bilateral carotid artery stenosis (less than 50%), reactive airway disease (was better on bisoprolol  rather than carvedilol ).  Most recent intervention was in August 2021 with placement of 2 drug-eluting stents in the mid-distal left circumflex coronary artery for both de novo lesion and distal in-stent restenosis (small non-STEMI with peak troponin 1800).   She had a great year that included a trip of her lifetime to [place] with an old friend and a cruise to [place] with her daughter Haley George.  Neither 1 of these was interrupted by any coronary problems or symptoms.  Over the last 6 weeks she has not felt as well.  She has noticed some discomfort in her central chest area and in the left infrascapular area while driving, reminiscent of previous angina.  She has had some dyspnea on exertion that occurs when she is carrying groceries or when going out of the cold, but does not occur when she is climbing the stairs in her home.  She does not have chest pain during physical exertion.  She has not had palpitations, dizziness or syncope.  She had a spell of severe discomfort in her left parietal area and left side of her face suggesting trigeminal neuralgia that resolved spontaneously.  She had an excellent response to [facility] with almost complete resolution of her psoriatic plaques.  She has not had problems with  edema and has not required furosemide  in quite a while.  She has not had neurological complaints or claudication.  Her ECG (which has often been normal even when she is experiencing acute coronary events, today shows T wave inversion in the high lateral leads, but no ST segment changes and she is asymptomatic at rest.  Her blood pressure surprisingly high even after we let her rest for about 50 minutes it was 152/96.  A week earlier with her primary care provider it was 156/92.  Echocardiogram performed 06/13/2023 showed normal left ventricular function with ejection fraction 59% and normal regional wall motion and pretty much unchanged aortic stenosis with mild-moderate severity (mean gradient 18 mmHg, calculated aortic valve area 1.13 cm, dimensionless valve index 0.44).  Metabolic control has deteriorated her hemoglobin A1c in January was up to 9.1% and triglycerides were 319.  However her LDL is excellent at 46.  She has a chronically low HDL at 25.  She continues to take an aggressive regimen of lipid-lowering that includes Repatha , rosuvastatin , fenofibrate  and Vascepa .  Diabetes is managed with Ozempic  and insulin ..  Unable to take SGLT2 inhibitors due to frequent urinary tract infections  Most recent potassium was 5.1.  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  Most recent creatinine 1.55, not far from her previous range of 1.3-1.5.  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  She developed unstable angina and she underwent cardiac catheterization on December 02, 2019.  The culprit lesion was found to be a 95% in-stent restenosis in the mid left circumflex coronary artery, as well as a de novo 70% mid left circumflex stenosis at the OM2 bifurcation.  This was treated by  placement of overlapping 2.5x12 and 2.25x20 drug-eluting Synergy stents (Dr. Mady).  Also noted was a mild-moderate mid to distal LAD in-stent restenosis and an unchanged 80% RPDA lesion.  She had complete relief of her angina following this intervention.    Previous coronary interventions 2007  - mid LAD 2.2512 mini vision - left circumflex 2.518 Cypher  - RCA 3.023 Cypher  2010  - mid LAD 2.2512 Taxus overlapping 2.012 mini vision, overlapping another 2.012 mini vision -  left circumflex 2.516 Taxus upstream of previous stent.  July 2020  - mid LAD Synergy 2.5 x12 , overlapping the proximal area of restenosis in the mid LAD (short area of 3 stent layers).  August 2021 - mid LCX overlapping 2.5x12 and 2.25x20 drug-eluting Synergy (the distal stent overlaps the previously placed Taxus stent from 2010)  She had a normal nuclear stress test in November 2016. EF was 56%. She had a false positive ECG response.  In July 2020 she had a moderate sized area of ischemia in the anteroseptal wall, EF 52%, Prior to placement of the mid LAD stent.  Echo in August 2021 showed normal left ventricular regional motion and EF 55-60%, unchanged by echo in December 2023.  Past Medical History:  Diagnosis Date   Asthma    Cancer (HCC)    basal cell chest   Complication of anesthesia    Coronary artery disease    Diabetes mellitus without complication (HCC)    type 2   Heart murmur    Hepatitis    auto- immune   History of kidney stones    History of nuclear stress test 05/21/2010   dipyridamole; normal perfusion, preserved LV systolic EF of 66%   Hyperlipidemia    Hypertension    Liver failure (HCC)    h/o autoimmune hepatitis    Myocardial infarction (HCC)    Neuropathy    resolved with back surgery; tingling and burning left leg to foot   Obesity    PONV (postoperative nausea and vomiting)    Psoriasis    Tubular adenoma of colon    colon path 01/09/2017    Past Surgical History:   Procedure Laterality Date   Carotid Doppler  10/2008   R & L ICAs 0-49% diameter reduction    COLONOSCOPY     CORONARY ANGIOPLASTY WITH STENT PLACEMENT  09/2008   2.25x83mm Cypher DES to in-stent restenosis of prox LAD; Mini-Vision stent x2 2.0x100mm to area distal of initial LAD stent; 3 2.5x43mm Taxus stents prox to Cypher stent in circumflex   CORONARY ANGIOPLASTY WITH STENT PLACEMENT  09/2005   Cypher DES 3.0x25mm to distal RCA; 2.25x67mm Mini0Vision stent to mid LAD; 2.5x38mm Cypher stent to circumflex   CORONARY STENT INTERVENTION N/A 11/13/2018   Procedure: CORONARY STENT INTERVENTION;  Surgeon: Dann Candyce RAMAN, MD;  Location: [facility] INVASIVE CV LAB;  Service: Cardiovascular;  Laterality: N/A;   CORONARY STENT INTERVENTION N/A 12/02/2019   Procedure: CORONARY STENT INTERVENTION;  Surgeon: Mady Bruckner, MD;  Location: [facility] INVASIVE CV LAB;  Service: Cardiovascular;  Laterality: N/A;   LEFT HEART CATH AND CORONARY ANGIOGRAPHY N/A 11/13/2018   Procedure: LEFT HEART CATH AND CORONARY ANGIOGRAPHY;  Surgeon: Dann Candyce RAMAN, MD;  Location: [facility] INVASIVE CV LAB;  Service: Cardiovascular;  Laterality: N/A;   LEFT HEART CATH AND CORONARY ANGIOGRAPHY N/A 12/02/2019   Procedure: LEFT HEART CATH AND CORONARY ANGIOGRAPHY;  Surgeon: Mady Bruckner, MD;  Location: [facility] INVASIVE CV LAB;  Service: Cardiovascular;  Laterality: N/A;   LUMBAR LAMINECTOMY/DECOMPRESSION MICRODISCECTOMY Left 10/18/2016   Procedure: Left Lumbar Four-Five Microdiscectomy;  Surgeon: Unice Pac, MD;  Location: [facility] OR;  Service: Neurosurgery;  Laterality: Left;  Left L4-5 Microdiscectomy   ROTATOR CUFF REPAIR Left    SHOULDER ARTHROSCOPY W/ ROTATOR CUFF REPAIR Right    TRANSTHORACIC ECHOCARDIOGRAM  04/2010   EF=>55%, borderline conc LVH; trace MR & TR; AV mildly sclerotic; aortic root sclerosis/calcif   TRIGGER FINGER RELEASE Left 02/19/2020   Procedure: RELEASE TRIGGER FINGER/A-1 PULLEY;  Surgeon: Shari Sieving, MD;  Location: [facility]  OR;  Service: Orthopedics;  Laterality: Left;   ureteral stents      Outpatient Medications Prior to Visit  Medication Sig Dispense Refill   BD PEN NEEDLE NANO 2ND GEN 32G X 4 MM MISC USE AS DIRECTED FOR INSULIN  ADMINISTRATION 300 each 3   clopidogrel  (PLAVIX ) 75 MG tablet TAKE 1 TABLET BY MOUTH EVERY DAY WITH BREAKFAST 90 tablet 2   Continuous Glucose Sensor (FREESTYLE LIBRE 3 PLUS SENSOR) MISC 1 each by Does not apply route every 14 (fourteen) days. 6 each 3   Continuous Glucose Sensor (FREESTYLE LIBRE 3 PLUS SENSOR) MISC Inject 1 Device into the skin continuous. Change every 15 days 6 each 3   estradiol  (ESTRACE ) 0.01 % CREA vaginal cream 1 gram vaginally twice weekly 42.5 g 1   Evolocumab  (REPATHA  SURECLICK) 140 MG/ML SOAJ INJECT 140 MG INTO THE SKIN EVERY 14 (FOURTEEN) DAYS. 6 mL 3   fenofibrate  (TRICOR ) 145 MG tablet TAKE 1 TABLET (145 MG TOTAL) BY MOUTH EVERY EVENING 90 tablet 2   fluticasone  furoate-vilanterol (BREO ELLIPTA ) 200-25 MCG/ACT AEPB Inhale 1 puff into the lungs daily. 3 each 3   furosemide  (LASIX ) 20 MG tablet Take 20 mg on days weight is 185 or greater (Patient taking differently: Take 20 mg by mouth. Take 20 mg on days weight is 185 or greater) 30 tablet 2   glucose blood (ONETOUCH VERIO) test strip Use to check blood sugar 2 times a day 200 each 12   icosapent  Ethyl (VASCEPA ) 1 g capsule Take 2 capsules (2 g total) by mouth 2 (two) times daily. 360 capsule 3   insulin  aspart (NOVOLOG  FLEXPEN) 100 UNIT/ML FlexPen Inject 16-22 units under skin 15 min before meals, 3x a day (Patient taking differently: Inject 18-22 units, before meals) 45 mL 2   insulin  glargine (LANTUS  SOLOSTAR) 100 UNIT/ML Solostar Pen Inject 45-50 Units into the skin daily. (Patient taking differently: Inject 50 Units into the skin daily.) 45 mL 3   isosorbide  mononitrate (IMDUR ) 60 MG 24 hr tablet TAKE 1 TABLET (60 MG TOTAL) BY MOUTH DAILY. ** DO NOT CRUSH ** 90 tablet 0   nystatin cream (MYCOSTATIN) Apply  topically as needed.     ReliOn Lancets Thin 26G MISC 1 Units by Does not apply route 2 (two) times daily. E 11.9 100 each 12   risankizumab-rzaa (SKYRIZI PEN) 150 MG/ML pen inject Subcutaneous one injection every 12 weeks     rosuvastatin  (CRESTOR ) 20 MG tablet TAKE 1 TABLET BY MOUTH EVERY DAY  90 tablet 3   Semaglutide , 1 MG/DOSE, (OZEMPIC , 1 MG/DOSE,) 4 MG/3ML SOPN Inject 1 mg into the skin once a week. 9 mL 3   valsartan  (DIOVAN ) 160 MG tablet TAKE 1 TABLET BY MOUTH EVERY DAY 90 tablet 2   bisoprolol  (ZEBETA ) 10 MG tablet Take 1 tablet (10mg ) by mouth daily in the evening. 90 tablet 3   bisoprolol  (ZEBETA ) 5 MG tablet Take 1 tablet (5mg ) by mouth daily in the morning. 90 tablet 3   nitroGLYCERIN  (NITROSTAT ) 0.4 MG SL tablet For chest pain, tightness, or pressure. While sitting, place 1 tablet under tongue. May be used every 5 minutes as needed, for up to 15 minutes. Do not use more than 3 tablets. (Patient not taking: Reported on 05/13/2024) 25 tablet 3   No facility-administered medications prior to visit.     Allergies:   Penicillins, Coconut flavoring agent (non-screening), Cocos nucifera, Other, Penicillin v potassium, and Penicillin g   Social History   Socioeconomic History   Marital status: Married    Spouse name: Not on file   Number of children: 1   Years of education: Not on file   Highest education level: Not on file  Occupational History    Employer: [employer]  Tobacco Use   Smoking status: Never   Smokeless tobacco: Never  Vaping Use   Vaping status: Never Used  Substance and Sexual Activity   Alcohol use: No   Drug use: No   Sexual activity: Not Currently    Birth control/protection: Post-menopausal  Other Topics Concern   Not on file  Social History Narrative   Not on file   Social Drivers of Health   Tobacco Use: Low Risk (05/18/2024)   Patient History    Smoking Tobacco Use: Never    Smokeless Tobacco Use: Never    Passive Exposure:  Not on file  Financial Resource Strain: Low Risk (03/21/2023)   Overall Financial Resource Strain (CARDIA)    Difficulty of Paying Living Expenses: Not very hard  Food Insecurity: No Food Insecurity (03/21/2023)   Hunger Vital Sign    Worried About Running Out of Food in the Last Year: Never true    Ran Out of Food in the Last Year: Never true  Transportation Needs: No Transportation Needs (03/21/2023)   PRAPARE - Administrator, Civil Service (Medical): No    Lack of Transportation (Non-Medical): No  Physical Activity: Inactive (03/21/2023)   Exercise Vital Sign    Days of Exercise per Week: 1 day    Minutes of Exercise per Session: 0 min  Stress: Stress Concern Present (03/21/2023)   [employer] of Occupational Health - Occupational Stress Questionnaire    Feeling of Stress : Rather much  Social Connections: Moderately Integrated (03/21/2023)   Social Connection and Isolation Panel    Frequency of Communication with Friends and Family: Twice a week    Frequency of Social Gatherings with Friends and Family: Twice a week    Attends Religious Services: More than 4 times per year    Active Member of [employer] or Organizations: Yes    Attends Banker Meetings: More than 4 times per year    Marital Status: Widowed  Depression (PHQ2-9): Medium Risk (05/07/2024)   Depression (PHQ2-9)    PHQ-2 Score: 7  Alcohol Screen: Not on file  Housing: Low Risk (03/21/2023)   Housing    Last Housing Risk Score: 0  Utilities: Not At Risk (03/21/2023)   AHC  Utilities    Threatened with loss of utilities: No  Health Literacy: Not on file     ROS:   Please see the history of present illness.    ROS  All other systems are reviewed and are negative.   PHYSICAL EXAM:   VS:  BP (!) 152/96   Pulse 87   Ht 5' 4 (1.626 m)   Wt 188 lb 12.8 oz (85.6 kg)   SpO2 98%   BMI 32.41 kg/m      General: Alert, oriented x3, no distress, mildly obese Head: no evidence of  trauma, PERRL, EOMI, no exophtalmos or lid lag, no myxedema, no xanthelasma; normal ears, nose and oropharynx Neck: normal jugular venous pulsations and no hepatojugular reflux; brisk carotid pulses without delay and no carotid bruits Chest: clear to auscultation, no signs of consolidation by percussion or palpation, normal fremitus, symmetrical and full respiratory excursions Cardiovascular: normal position and quality of the apical impulse, regular rhythm, normal first and distinct second heart sounds, 3/6 early peaking aortic ejection murmur, high no diastolic murmurs, rubs or gallops Abdomen: no tenderness or distention, no masses by palpation, no abnormal pulsatility or arterial bruits, normal bowel sounds, no hepatosplenomegaly Extremities: no clubbing, cyanosis or edema; 2+ radial, ulnar and brachial pulses bilaterally; 2+ right femoral, posterior tibial and dorsalis pedis pulses; 2+ left femoral, posterior tibial and dorsalis pedis pulses; no subclavian or femoral bruits Neurological: grossly nonfocal Psych: Normal mood and affect     Wt Readings from Last 3 Encounters:  05/16/24 187 lb 12.8 oz (85.2 kg)  05/14/24 188 lb 12 oz (85.6 kg)  05/13/24 188 lb 12.8 oz (85.6 kg)    Studies/Labs Reviewed:   Echocardiogram 06/13/2023  1. Left ventricular ejection fraction, by estimation, is 55 to 60%. Left  ventricular ejection fraction by 3D volume is 59 %. The left ventricle has  normal function. The left ventricle has no regional wall motion  abnormalities. There is mild left  ventricular hypertrophy. Left ventricular diastolic parameters are  consistent with Grade I diastolic dysfunction (impaired relaxation). The  average left ventricular global longitudinal strain is -12.1 %. The global  longitudinal strain is abnormal.   2. Right ventricular systolic function is normal. The right ventricular  size is normal.   3. The mitral valve is normal in structure. Mild mitral valve   regurgitation. No evidence of mitral stenosis.   4. DI: 0.44. The aortic valve is calcified. There is moderate  calcification of the aortic valve. There is moderate thickening of the  aortic valve. Aortic valve regurgitation is not visualized. Mild to  moderate aortic valve stenosis. Aortic valve area,  by VTI measures 1.13 cm. Aortic valve mean gradient measures 18.0 mmHg.  Aortic valve Vmax measures 2.83 m/s.   5. The inferior vena cava is normal in size with greater than 50%  respiratory variability, suggesting right atrial pressure of 3 mmHg.  EKG:   EKG Interpretation Date/Time:  Monday May 13 2024 09:22:29 EST Ventricular Rate:  87 PR Interval:  134 QRS Duration:  76 QT Interval:  370 QTC Calculation: 445 R Axis:   37  Text Interpretation: Normal sinus rhythm T wave abnormality, consider lateral ischemia When compared with ECG of 18-May-2023 11:24,  lateral T wave inversion is new Confirmed by Marla Pouliot ([id number]) on 05/13/2024 9:27:07 AM         Recent Labs: 05/07/2024: ALT 24; BUN 32; Creatinine, Ser 1.55; Hemoglobin 13.3; Platelets 233; Potassium 5.1; Sodium 138   Lipid  Panel    Component Value Date/Time   CHOL 120 05/07/2024 1144   TRIG 319 (H) 05/07/2024 1144   HDL 25 (L) 05/07/2024 1144   CHOLHDL 4.8 (H) 05/07/2024 1144   CHOLHDL 6.7 12/02/2019 0336   VLDL UNABLE TO CALCULATE IF TRIGLYCERIDE OVER 400 mg/dL 91/90/7978 9663   LDLCALC 46 05/07/2024 1144   LDLDIRECT 93 11/15/2012 0829    ASSESSMENT:    1. Chronic diastolic heart failure (HCC)   2. Coronary artery disease of native artery of native heart with stable angina pectoris   3. Shortness of breath   4. Mild persistent asthma without complication   5. Hyperlipidemia associated with type 2 diabetes mellitus (HCC)   6. Nonrheumatic aortic valve stenosis   7. Essential hypertension   8. Type 2 diabetes mellitus with other circulatory complications (HCC)   9. Stage 3b chronic kidney disease (HCC)    10. Class 1 obesity due to excess calories with serious comorbidity and body mass index (BMI) of 34.0 to 34.9 in adult   11. Iron deficiency anemia due to chronic blood loss   12. Psoriasis   13. Reactive depression    PLAN:  In order of problems listed above:  CHF: NYHA functional class I-II and clinically euvolemic, taking furosemide  very infrequently.  Trying to stick to a low-sodium diet.  Continue to take furosemide  to as needed for weight 185 lb or higher She has preserved left ventricular systolic function.  Unable to tolerate SGLT2 inhibitors due to frequent urinary tract infections even before taking these medications.  CAD:   Having some relatively vague symptoms suggestive of angina, but not brought on by exertion.  Her blood pressure is high and I wonder if that is one of the reasons.  She does have some subtle EKG changes.  We have always been suspicions of the fact that she may have coronary vasospasm.  Taking chronic antiplatelet therapy with clopidogrel  long term due to complex CAD, including multiple and overlapping stents.  Will increase her beta-blocker.  Continue isosorbide  mononitrate option to add Ranexa  or amlodipine for symptom relief. Asthma: Using highly selective selective beta-blocker Bisoprolol  to avoid worsening bronchospasm (occurred with carvedilol  and metoprolol ). HLP:  On highly effective lipid-lowering therapy with combination rosuvastatin  and Repatha  and Vascepa  and fenofibrate . Lipid parameters are pretty good with the exception of elevated triglycerides, but these are probably due to worsening glycemic control.  Excellent LDL, chronically very low HDL.  She has multiple family members with early onset CAD including an uncle who passed away at age 36 and an uncle that required bypass surgery at 84.  AS: This has been slowly worsening and we have discussed the fact that eventually she will probably require TAVR.  Due for repeat echo study.  Continues to have an  early peaking aortic stenosis murmur and a distinct second heart sound suggesting nonsevere aortic stenosis. HTN: After we allowed her to rest for a while.  Increase bisoprolol  to 10 mg twice daily.  Watch for worsening wheezing. DM: Glycemic control has worsened.  This probably explains the worsening triglycerides.  Reinforced the importance of compliance with diet. CKD 3B: Most recent creatinine 1.55, close to her recent stable range 1.3-1.5 range (GFR approximately 45).   She has had a couple of events in the last 3 years where she had acute kidney injury with a creatinine that increased to 2.5-3.0 range, most recently due to her episode of vertigo in 2023.  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  Avoid other medications that can raise potassium such as aldosterone antagonist, trimethoprim . Obesity: Unfortunately she has gained weight and is up BMI that is now up to 32.  This is probably contributing to worsening glycemic control. Anemia: Has resolved completely on iron supplements.  Shortness of breath and chest pain were both worse when she was anemic.  Most recent hemoglobin 12.7. Psoriasis:  sees Dr. Robinson.  Some response to [employer]. Depression: Appears to be very much compensated, remains socially engaged, seems more positive.  Still misses Haley George a lot. BPV: Not currently active.  This was severe enough to cause hospitalization for a couple of days in 2024, but has resolved.  She is aware of Epley maneuvers.  Usually improves promptly when she starts meclizine .     Medication Adjustments/Labs and Tests Ordered: Current medicines are reviewed at length with the patient today.  Concerns regarding medicines are outlined above.  Medication changes, Labs and Tests ordered today are listed in the Patient Instructions below. Patient Instructions  Medication Instructions:  Increase Bisoprolol  to 10 mg twice daily *If you need a refill  on your cardiac medications before your next appointment, please call your pharmacy*  Please send BP log at the end of this week  Please have PCP repeat an EKG/ECG at your upcoming appointment  Lab Work: None ordered If you have labs (blood work) drawn today and your tests are completely normal, you will receive your results only by: MyChart Message (if you have MyChart) OR A paper copy in the mail If you have any lab test that is abnormal or we need to change your treatment, we will call you to review the results.  Testing/Procedures: Your physician has requested that you have an echocardiogram. Echocardiography is a painless test that uses sound waves to create images of your heart. It provides your doctor with information about the size and shape of your heart and how well your hearts chambers and valves are working. This procedure takes approximately one hour. There are no restrictions for this procedure. Please do NOT wear cologne, perfume, aftershave, or lotions (deodorant is allowed). Please arrive 15 minutes prior to your appointment time.  Please note: We ask at that you not bring children with you during ultrasound (echo/ vascular) testing. Due to room size and safety concerns, children are not allowed in the ultrasound rooms during exams. Our front office staff cannot provide observation of children in our lobby area while testing is being conducted. An adult accompanying a patient to their appointment will only be allowed in the ultrasound room at the discretion of the ultrasound technician under special circumstances. We apologize for any inconvenience.   Follow-Up: At [facility], you and your health needs are our priority.  As part of our continuing mission to provide you with exceptional heart care, our providers are all part of one team.  This team includes your primary Cardiologist (physician) and Advanced Practice Providers or APPs (Physician Assistants and Nurse  Practitioners) who all work together to provide you with the care you need, when you need it.  Your next appointment:    3-4 months  Provider:   Jerel Balding, MD    We recommend signing up for the patient portal called MyChart.  Sign up information is provided on this After Visit Summary.  MyChart is used to connect with patients for Virtual Visits (Telemedicine).  Patients are able to view lab/test results,  encounter notes, upcoming appointments, etc.  Non-urgent messages can be sent to your provider as well.   To learn more about what you can do with MyChart, go to [email or web address].            Signed, Haley Balding, MD  05/18/2024 9:33 PM    [facility] [facility] [address] [place], [place], [place]  [address] Phone: [phone]; Fax: [phone]

## 2024-05-13 NOTE — Patient Instructions (Signed)
 Medication Instructions:  Increase Bisoprolol  to 10 mg twice daily *If you need a refill on your cardiac medications before your next appointment, please call your pharmacy*  Please send BP log at the end of this week  Please have PCP repeat an EKG/ECG at your upcoming appointment  Lab Work: None ordered If you have labs (blood work) drawn today and your tests are completely normal, you will receive your results only by: MyChart Message (if you have MyChart) OR A paper copy in the mail If you have any lab test that is abnormal or we need to change your treatment, we will call you to review the results.  Testing/Procedures: Your physician has requested that you have an echocardiogram. Echocardiography is a painless test that uses sound waves to create images of your heart. It provides your doctor with information about the size and shape of your heart and how well your hearts chambers and valves are working. This procedure takes approximately one hour. There are no restrictions for this procedure. Please do NOT wear cologne, perfume, aftershave, or lotions (deodorant is allowed). Please arrive 15 minutes prior to your appointment time.  Please note: We ask at that you not bring children with you during ultrasound (echo/ vascular) testing. Due to room size and safety concerns, children are not allowed in the ultrasound rooms during exams. Our front office staff cannot provide observation of children in our lobby area while testing is being conducted. An adult accompanying a patient to their appointment will only be allowed in the ultrasound room at the discretion of the ultrasound technician under special circumstances. We apologize for any inconvenience.   Follow-Up: At [facility], you and your health needs are our priority.  As part of our continuing mission to provide you with exceptional heart care, our providers are all part of one team.  This team includes your primary  Cardiologist (physician) and Advanced Practice Providers or APPs (Physician Assistants and Nurse Practitioners) who all work together to provide you with the care you need, when you need it.  Your next appointment:    3-4 months  Provider:   Jerel Balding, MD    We recommend signing up for the patient portal called MyChart.  Sign up information is provided on this After Visit Summary.  MyChart is used to connect with patients for Virtual Visits (Telemedicine).  Patients are able to view lab/test results, encounter notes, upcoming appointments, etc.  Non-urgent messages can be sent to your provider as well.   To learn more about what you can do with MyChart, go to [email or web address].

## 2024-05-14 ENCOUNTER — Encounter: Attending: Internal Medicine | Admitting: Nutrition

## 2024-05-14 NOTE — Progress Notes (Deleted)
 Patient is here today to discuss her diet and questions answered about insulin  pump therapy and other medications. Diabetes medications:  Ozempic  once weekly,                                       Lantus  50u at HS  says rarely forgets this dose                                      Novolog  16-22 ac meals- usually 20.   Exercise: none SBGM:  Libre 3 plus reports frustration with them because the 'fall off frequently, Diet:  Says it is low carb but understands that all foods raise blood sugar level.  But is not certain how much she should be eating at each meal, or how they effect blood sugar Typical day:

## 2024-05-15 ENCOUNTER — Ambulatory Visit (HOSPITAL_BASED_OUTPATIENT_CLINIC_OR_DEPARTMENT_OTHER): Admitting: Obstetrics & Gynecology

## 2024-05-16 ENCOUNTER — Encounter: Payer: Self-pay | Admitting: Family Medicine

## 2024-05-16 ENCOUNTER — Ambulatory Visit: Admitting: Family Medicine

## 2024-05-16 VITALS — BP 144/80 | HR 72 | Ht 64.0 in | Wt 187.8 lb

## 2024-05-16 DIAGNOSIS — E1159 Type 2 diabetes mellitus with other circulatory complications: Secondary | ICD-10-CM | POA: Diagnosis not present

## 2024-05-16 DIAGNOSIS — I2510 Atherosclerotic heart disease of native coronary artery without angina pectoris: Secondary | ICD-10-CM | POA: Diagnosis not present

## 2024-05-16 DIAGNOSIS — I152 Hypertension secondary to endocrine disorders: Secondary | ICD-10-CM | POA: Diagnosis not present

## 2024-05-16 NOTE — Progress Notes (Signed)
" ° ° ° ° ° ° ° ° ° ° ° ° °  Subjective:    Patient ID: Haley George, female    DOB: 1955-06-30, 69 y.o.   MRN: [id number]  HPI She is here at the request of her cardiologist for blood pressure check and for follow-up EKG.  His note was reviewed and EKG was also evaluated.  It did show some lateral ischemia.  Presently she does not complain of any chest pain or shortness of breath, dyspnea or PND. She was also supposed to increase her bisoprolol  to 10 mg twice daily but is just started to do that. She does have underlying diabetes and is followed by endocrinology for that. Review of Systems     Objective:    Physical Exam Alert and in no distress. EKG read by me does show a rate of 76 with some lateral ischemia noted but no change from previous EKG.       Assessment & Plan:   Assessment & Plan   Hypertension associated with diabetes (HCC)  ASHD (arteriosclerotic heart disease) I discussed blood pressure reading with her.  She is to bring her blood pressure cuff in and measure it against ours to make sure she is getting accurate reading.  Explained that the blood pressure needs to be checked in the resting for 5 minutes, sitting position with feet flat on the ground and arm at heart level.  Explained that we would rather check her blood pressure based on readings at home as opposed to our office.  Reinforced the need for her to take her blood pressure medications as prescribed by Dr. JAYSON. I then discussed diabetes with her in regard to Ozempic  and checking her blood sugars.  She has had some difficulty with nausea but recommend she discuss this further with Dr. Trixie and if continued difficulty, explained that Mounjaro might possibly be a better choice for her but she would need to discuss that further with Dr. Trixie "

## 2024-05-17 ENCOUNTER — Other Ambulatory Visit: Payer: Self-pay

## 2024-05-17 DIAGNOSIS — R9431 Abnormal electrocardiogram [ECG] [EKG]: Secondary | ICD-10-CM

## 2024-05-18 ENCOUNTER — Encounter: Payer: Self-pay | Admitting: Cardiovascular Disease

## 2024-05-21 NOTE — Progress Notes (Unsigned)
 Patient is here today to discuss her diet and questions answered about insulin  pump therapy and other medications. Diabetes medications:  Ozempic  once weekly,                                       Lantus  50u at HS  says rarely forgets this dose                                      Novolog  16-22 ac meals- usually 20.   Exercise: none SBGM:  Libre 3 plus reports frustration with them because the 'fall off frequently, Diet:  Says it is low carb but understands that all foods raise blood sugar level.  But is not certain how much she should be eating at each meal, or how they effect blood sugar Discussion:  Timing of her insulins and they work with respect to meals eating and FBSs.  Discussion of how to adjust this dose with reference to past blood sugar readings and amounts of carbs and fat in her meals. Substance/wipes to use to help sensors stay on:  Skin tac and IV prep.  Samples given.  Also discussed need to wipe with alcohol X2 to remove oil from skin. 3 basic food groups, which foods fall into each group, how each group effects blood sugar, and servings sizes of each group and how much she should be eating at each meal with reference to her kidney disease. Effect of exercise on blood sugar and how this decreases insulin  resistance. Need for reduction in fats to limit calories to help with weight reduction.   Discussed how Ozempic  works to control blood sugar Questions answered about insulin  pump therapy.  How pumps work, and advantages and disadvantages of pump therapy.   Patient consuming [employer] liguid drink for breakfast at the suggestion of her MD.  It contains 60 grams of protein.  Not sure this is acceptable with her kidney function.   Believe overall understanding of how to adjust insulin  doses based on meal size is good and motivation for needed dietary changes is good

## 2024-05-21 NOTE — Progress Notes (Unsigned)
" ° °  Established Patient Office Visit  Subjective   Patient ID: Haley George, female    DOB: [birth year]  Age: 69 y.o. MRN: [id number]  Chief Complaint  Patient presents with   Diabetes    Diet and medication discussion.    HPI  {History (Optional):23778}  ROS    Objective:     Wt 188 lb 12 oz (85.6 kg)   BMI 32.40 kg/m  {Vitals History (Optional):23777}  Physical Exam   No results found for any visits on 05/14/24.  {Labs (Optional):23779}  The ASCVD Risk score (Arnett DK, et al., 2019) failed to calculate for the following reasons:   Risk score cannot be calculated because patient has a medical history suggesting prior/existing ASCVD   * - Cholesterol units were assumed    Assessment & Plan:   Problem List Items Addressed This Visit   None   No follow-ups on file.    Jak Haggar D, RN  "

## 2024-05-25 ENCOUNTER — Other Ambulatory Visit: Payer: Self-pay | Admitting: Family Medicine

## 2024-05-25 DIAGNOSIS — E1159 Type 2 diabetes mellitus with other circulatory complications: Secondary | ICD-10-CM

## 2024-05-30 ENCOUNTER — Encounter (HOSPITAL_BASED_OUTPATIENT_CLINIC_OR_DEPARTMENT_OTHER): Admitting: Obstetrics & Gynecology

## 2024-06-07 ENCOUNTER — Other Ambulatory Visit (HOSPITAL_BASED_OUTPATIENT_CLINIC_OR_DEPARTMENT_OTHER)

## 2024-07-04 ENCOUNTER — Ambulatory Visit: Admitting: Internal Medicine

## 2024-08-13 ENCOUNTER — Ambulatory Visit: Admitting: Cardiovascular Disease
